# Patient Record
Sex: Female | Born: 1935 | Race: White | Hispanic: No | Marital: Married | Smoking: Never smoker
Health system: Southern US, Community
[De-identification: ages and names within clinical notes are randomized; demographics above are authoritative.]

## PROBLEM LIST (undated history)

## (undated) DIAGNOSIS — I4891 Unspecified atrial fibrillation: Secondary | ICD-10-CM

## (undated) DIAGNOSIS — I639 Cerebral infarction, unspecified: Secondary | ICD-10-CM

## (undated) DIAGNOSIS — E079 Disorder of thyroid, unspecified: Secondary | ICD-10-CM

## (undated) DIAGNOSIS — E785 Hyperlipidemia, unspecified: Secondary | ICD-10-CM

## (undated) DIAGNOSIS — I10 Essential (primary) hypertension: Secondary | ICD-10-CM

## (undated) HISTORY — PX: ABDOMINAL HYSTERECTOMY: SHX81

---

## 1978-05-14 HISTORY — PX: BREAST BIOPSY: SHX20

## 2004-04-11 ENCOUNTER — Ambulatory Visit: Payer: Self-pay | Admitting: Internal Medicine

## 2004-05-30 ENCOUNTER — Ambulatory Visit: Payer: Self-pay | Admitting: Unknown Physician Specialty

## 2004-06-12 ENCOUNTER — Ambulatory Visit: Payer: Self-pay | Admitting: Unknown Physician Specialty

## 2005-04-19 ENCOUNTER — Ambulatory Visit: Payer: Self-pay | Admitting: Internal Medicine

## 2005-12-13 ENCOUNTER — Ambulatory Visit: Payer: Self-pay | Admitting: Unknown Physician Specialty

## 2006-05-03 ENCOUNTER — Ambulatory Visit: Payer: Self-pay | Admitting: Internal Medicine

## 2007-03-15 ENCOUNTER — Other Ambulatory Visit: Payer: Self-pay

## 2007-03-15 ENCOUNTER — Emergency Department: Payer: Self-pay | Admitting: Emergency Medicine

## 2007-05-06 ENCOUNTER — Ambulatory Visit: Payer: Self-pay | Admitting: Internal Medicine

## 2007-08-13 ENCOUNTER — Ambulatory Visit: Payer: Self-pay | Admitting: Internal Medicine

## 2008-05-10 ENCOUNTER — Ambulatory Visit: Payer: Self-pay | Admitting: Internal Medicine

## 2008-06-24 ENCOUNTER — Emergency Department: Payer: Self-pay | Admitting: Emergency Medicine

## 2008-08-12 ENCOUNTER — Inpatient Hospital Stay: Payer: Self-pay | Admitting: Cardiology

## 2008-08-12 DIAGNOSIS — Z9889 Other specified postprocedural states: Secondary | ICD-10-CM | POA: Insufficient documentation

## 2008-09-23 ENCOUNTER — Encounter: Payer: Self-pay | Admitting: Cardiology

## 2008-10-12 ENCOUNTER — Encounter: Payer: Self-pay | Admitting: Cardiology

## 2008-11-11 ENCOUNTER — Encounter: Payer: Self-pay | Admitting: Cardiology

## 2009-05-11 ENCOUNTER — Ambulatory Visit: Payer: Self-pay | Admitting: Internal Medicine

## 2009-05-12 ENCOUNTER — Ambulatory Visit: Payer: Self-pay | Admitting: Internal Medicine

## 2009-05-14 DIAGNOSIS — I4891 Unspecified atrial fibrillation: Secondary | ICD-10-CM

## 2009-05-14 HISTORY — DX: Unspecified atrial fibrillation: I48.91

## 2009-08-30 ENCOUNTER — Ambulatory Visit: Payer: Self-pay | Admitting: Unknown Physician Specialty

## 2009-09-19 ENCOUNTER — Emergency Department: Payer: Self-pay | Admitting: Emergency Medicine

## 2010-05-08 ENCOUNTER — Emergency Department: Payer: Self-pay | Admitting: Emergency Medicine

## 2010-05-12 ENCOUNTER — Ambulatory Visit: Payer: Self-pay | Admitting: Internal Medicine

## 2010-09-15 ENCOUNTER — Ambulatory Visit: Payer: Self-pay | Admitting: Sports Medicine

## 2010-12-04 ENCOUNTER — Ambulatory Visit: Payer: Self-pay | Admitting: Orthopedic Surgery

## 2010-12-15 ENCOUNTER — Inpatient Hospital Stay: Payer: Self-pay | Admitting: Orthopedic Surgery

## 2011-05-29 ENCOUNTER — Ambulatory Visit: Payer: Self-pay | Admitting: Family Medicine

## 2011-08-05 ENCOUNTER — Emergency Department: Payer: Self-pay | Admitting: Internal Medicine

## 2011-08-05 LAB — COMPREHENSIVE METABOLIC PANEL
Albumin: 4.2 g/dL (ref 3.4–5.0)
Alkaline Phosphatase: 85 U/L (ref 50–136)
Anion Gap: 12 (ref 7–16)
BUN: 23 mg/dL — ABNORMAL HIGH (ref 7–18)
Bilirubin,Total: 0.3 mg/dL (ref 0.2–1.0)
Calcium, Total: 9.6 mg/dL (ref 8.5–10.1)
Co2: 27 mmol/L (ref 21–32)
Creatinine: 0.94 mg/dL (ref 0.60–1.30)
EGFR (African American): >60
EGFR (Non-African Amer.): >60
Potassium: 4 mmol/L (ref 3.5–5.1)
SGOT(AST): 26 U/L (ref 15–37)
SGPT (ALT): 30 U/L
Sodium: 148 mmol/L — ABNORMAL HIGH (ref 136–145)
Total Protein: 7.7 g/dL (ref 6.4–8.2)

## 2011-08-05 LAB — URINALYSIS, COMPLETE
Bacteria: NONE SEEN
Blood: NEGATIVE
Glucose,UR: NEGATIVE mg/dL (ref 0–75)
Ketone: NEGATIVE
Leukocyte Esterase: NEGATIVE
Ph: 7 (ref 4.5–8.0)
RBC,UR: <1 /HPF (ref 0–5)

## 2011-08-05 LAB — CBC
HCT: 41.5 % (ref 35.0–47.0)
HGB: 14 g/dL (ref 12.0–16.0)
MCH: 31.3 pg (ref 26.0–34.0)
MCV: 93 fL (ref 80–100)
RBC: 4.48 10*6/uL (ref 3.80–5.20)
RDW: 14.2 % (ref 11.5–14.5)
WBC: 6.5 10*3/uL (ref 3.6–11.0)

## 2011-08-05 LAB — TROPONIN I: Troponin-I: <0.02 ng/mL

## 2011-08-07 ENCOUNTER — Observation Stay: Payer: Self-pay | Admitting: Family Medicine

## 2011-08-07 ENCOUNTER — Ambulatory Visit: Payer: Self-pay | Admitting: Cardiology

## 2011-08-07 LAB — CBC
HCT: 40.1 % (ref 35.0–47.0)
HGB: 13.4 g/dL (ref 12.0–16.0)
MCH: 31.2 pg (ref 26.0–34.0)
MCHC: 33.5 g/dL (ref 32.0–36.0)
MCV: 93 fL (ref 80–100)
Platelet: 269 10*3/uL (ref 150–440)
RDW: 14.1 % (ref 11.5–14.5)
WBC: 7 10*3/uL (ref 3.6–11.0)

## 2011-08-07 LAB — PROTIME-INR
INR: 0.9
Prothrombin Time: 13 secs (ref 11.5–14.7)

## 2011-08-07 LAB — COMPREHENSIVE METABOLIC PANEL
Anion Gap: 8 (ref 7–16)
BUN: 18 mg/dL (ref 7–18)
Calcium, Total: 9.9 mg/dL (ref 8.5–10.1)
EGFR (African American): >60
Glucose: 96 mg/dL (ref 65–99)
Osmolality: 289 (ref 275–301)
Potassium: 4.2 mmol/L (ref 3.5–5.1)
SGOT(AST): 28 U/L (ref 15–37)
Sodium: 144 mmol/L (ref 136–145)

## 2011-08-07 LAB — CK TOTAL AND CKMB (NOT AT ARMC)
CK, Total: 50 U/L (ref 21–215)
CK-MB: 0.8 ng/mL (ref 0.5–3.6)

## 2011-08-07 LAB — TROPONIN I: Troponin-I: <0.02 ng/mL

## 2011-08-07 LAB — APTT: Activated PTT: 29.4 secs (ref 23.6–35.9)

## 2011-08-08 LAB — CBC WITH DIFFERENTIAL/PLATELET
Basophil #: 0 10*3/uL (ref 0.0–0.1)
HGB: 12.1 g/dL (ref 12.0–16.0)
Lymphocyte #: 2.2 10*3/uL (ref 1.0–3.6)
Lymphocyte %: 36.1 %
MCH: 31 pg (ref 26.0–34.0)
MCHC: 33.4 g/dL (ref 32.0–36.0)
MCV: 93 fL (ref 80–100)
Monocyte #: 0.6 10*3/uL (ref 0.0–0.7)
Neutrophil #: 3.1 10*3/uL (ref 1.4–6.5)
Platelet: 240 10*3/uL (ref 150–440)
RBC: 3.91 10*6/uL (ref 3.80–5.20)
RDW: 13.7 % (ref 11.5–14.5)
WBC: 6.2 10*3/uL (ref 3.6–11.0)

## 2011-08-08 LAB — COMPREHENSIVE METABOLIC PANEL
Alkaline Phosphatase: 71 U/L (ref 50–136)
BUN: 17 mg/dL (ref 7–18)
Calcium, Total: 9.1 mg/dL (ref 8.5–10.1)
Chloride: 106 mmol/L (ref 98–107)
Co2: 28 mmol/L (ref 21–32)
Creatinine: 0.97 mg/dL (ref 0.60–1.30)
EGFR (African American): >60
Glucose: 86 mg/dL (ref 65–99)
Osmolality: 290 (ref 275–301)
Potassium: 3.7 mmol/L (ref 3.5–5.1)
SGOT(AST): 20 U/L (ref 15–37)
Sodium: 145 mmol/L (ref 136–145)
Total Protein: 6.2 g/dL — ABNORMAL LOW (ref 6.4–8.2)

## 2011-08-09 LAB — CBC WITH DIFFERENTIAL/PLATELET
Basophil #: 0 10*3/uL (ref 0.0–0.1)
Basophil %: 0.5 %
Eosinophil #: 0.3 10*3/uL (ref 0.0–0.7)
Eosinophil %: 4.4 %
HCT: 37.6 % (ref 35.0–47.0)
Lymphocyte #: 1.8 10*3/uL (ref 1.0–3.6)
MCHC: 33.3 g/dL (ref 32.0–36.0)
MCV: 93 fL (ref 80–100)
Neutrophil #: 3.2 10*3/uL (ref 1.4–6.5)
Neutrophil %: 54.3 %
Platelet: 256 10*3/uL (ref 150–440)
RBC: 4.04 10*6/uL (ref 3.80–5.20)

## 2011-08-12 ENCOUNTER — Emergency Department: Payer: Self-pay | Admitting: Emergency Medicine

## 2012-06-03 ENCOUNTER — Ambulatory Visit: Payer: Self-pay | Admitting: Family Medicine

## 2012-10-06 ENCOUNTER — Emergency Department: Payer: Self-pay | Admitting: Emergency Medicine

## 2013-06-16 ENCOUNTER — Ambulatory Visit: Payer: Self-pay | Admitting: Family Medicine

## 2013-07-28 ENCOUNTER — Ambulatory Visit: Payer: Self-pay | Admitting: Rheumatology

## 2013-09-14 DIAGNOSIS — I341 Nonrheumatic mitral (valve) prolapse: Secondary | ICD-10-CM | POA: Insufficient documentation

## 2013-09-14 DIAGNOSIS — I4891 Unspecified atrial fibrillation: Secondary | ICD-10-CM | POA: Diagnosis present

## 2013-09-14 DIAGNOSIS — E039 Hypothyroidism, unspecified: Secondary | ICD-10-CM | POA: Insufficient documentation

## 2013-09-14 DIAGNOSIS — K219 Gastro-esophageal reflux disease without esophagitis: Secondary | ICD-10-CM | POA: Insufficient documentation

## 2013-09-14 DIAGNOSIS — K5790 Diverticulosis of intestine, part unspecified, without perforation or abscess without bleeding: Secondary | ICD-10-CM | POA: Insufficient documentation

## 2013-09-14 DIAGNOSIS — I10 Essential (primary) hypertension: Secondary | ICD-10-CM | POA: Insufficient documentation

## 2013-11-03 DIAGNOSIS — R7303 Prediabetes: Secondary | ICD-10-CM | POA: Insufficient documentation

## 2013-11-03 DIAGNOSIS — E7800 Pure hypercholesterolemia, unspecified: Secondary | ICD-10-CM | POA: Insufficient documentation

## 2014-09-05 NOTE — Discharge Summary (Signed)
PATIENT NAME:  Courtney Chan, Courtney Chan MR#:  [id number] DATE OF BIRTH:  23-Jul-1930  DATE OF ADMISSION:  08/07/2011 DATE OF DISCHARGE:  08/09/2011  DISCHARGE DIAGNOSES:  1. Cerebrovascular accident (nonhemorrhagic right insular cortex infarct).  2. Atrial fibrillation on Pradaxa.  3. Hypothyroidism.  4. History of coronary artery disease.  5. Hyperlipidemia.  6. Gastroesophageal reflux disease.  DISCHARGE MEDICATIONS:  1. Metoprolol succinate 100 mg p.o. extended release once daily.  2. Lovastatin 40 mg p.o. at bedtime.  3. Prilosec 20 mg p.o. daily.  4. Synthroid 100 mcg p.o. daily.  5. Calcium/vitamin D as directed.  6. Pradaxa 150 mg p.o. b.i.d.   MEDICATION TO HOLD: Aspirin for now.   CONSULTS: None.   LABORATORY, DIAGNOSTIC AND RADIOLOGICAL DATA: Patient underwent an echocardiogram that showed normal function, mild MR and mild TR. MRI of the brain showed punctate acute nonhemorrhagic right insular cortex infarct. Carotids were negative. CT of the head was negative.   Pertinent labs on day of discharge: White blood cell count 6, hemoglobin 12.5, platelets 256.   BRIEF HOSPITAL COURSE: Patient came in with symptoms of:  1. Transient ischemic attack. Initial head CT was negative. She was placed on observation. Her symptoms resolved quickly. She was evaluated by PT who stated that she did not meet any physical therapy need. She underwent a carotid study that was negative. Also, underwent echo that was normal with mild MR and TR. She had an MRI that did show a punctate acute nonhemorrhagic right insular cortex infarct. She was started on Pradaxa 150 mg p.o. b.i.d. Discussed patient with Dr. Darrold JunkerParaschos who agreed with the current regimen. Plan to hold aspirin for now in case she has bleeding complications and will defer to Dr. Darrold JunkerParaschos whether to continue that.  2. Atrial fibrillation. She was previously on metoprolol 100 morning and 50 in the evening of succinate, however, during her hospital  stay her blood pressure was quite low and her heart rate was in the low 50s therefore her medications were changed. Plan to continue with metoprolol succinate 100 mg once a day. Patient was instructed to check her blood pressure and heart rate and will make changes as needed.   DISPOSITION: She is in stable condition to be discharged to home.   FOLLOW UP: Follow up with Dr. Burnadette PopLinthavong in 1 to 2 weeks.  ____________________________ Marisue IvanKanhka Savino Whisenant, MD kl:cms D: 08/09/2011 08:32:23 ET T: 08/09/2011 14:23:24 ET JOB#: [id number]  cc: Marisue IvanKanhka Maor Meckel, MD, <Dictator> Marisue IvanKANHKA Nga Rabon MD ELECTRONICALLY SIGNED 08/15/2011 18:02

## 2014-09-05 NOTE — H&P (Signed)
PATIENT NAME:  Courtney Chan, Courtney Chan MR#:  [id number] DATE OF BIRTH:  [birth year]  DATE OF ADMISSION:  08/07/2011  CHIEF COMPLAINT: Ataxia, aphasia.   HISTORY OF PRESENT ILLNESS: Courtney Chan is a 81 year old female with history of atrial fibrillation not on anticoagulation who was walking today and had 30 minutes of inability to walk well, difficulty speaking. She went to her previously planned appointment with Dr. Saralyn Pilar who ordered head CT and carotid Doppler and sent her to the Emergency Room. She had a recurrence of this, again lasted 30 minutes total. She has not had anything similar prior. No history of strokes. Her rate has been controlled as far as her atrial fibrillation up until now with the metoprolol. She has taken aspirin. She is otherwise in her normal state of health, feels well at this point.   REVIEW OF SYSTEMS: Negative and complete other than above.   PAST MEDICAL HISTORY:  1. Hypothyroid.  2. Mitral valve prolapse.  3. Diverticulosis.  4. Gastroesophageal reflux disease.  5. Hyperlipidemia.  6. Hypertension.  7. Coronary artery disease, status post bare metal stents proximal left circ and mid right coronary artery in 2010.  8. History of intermittent atrial fibrillation.   PAST SURGICAL HISTORY:  1. Hysterectomy. 2. Appendectomy.   MEDICATIONS: 1. Calcium daily.  2. Lovastatin 40 mg daily.  3. Synthroid 100 mcg daily.  4. Prilosec 20 mg daily.  5. Metoprolol XL 100 mg in the morning and 50 mg nightly. 6. Aspirin 81 mg daily.   ALLERGIES: No drug allergies.   FAMILY HISTORY: Father died at age 74. Mother died at 64. No heart disease or cancer noted in the family.   SOCIAL HISTORY: Married. Lives with her husband who is with her now. She is retired. No tobacco or alcohol noted.   PHYSICAL EXAMINATION:   GENERAL: Pleasant, no acute distress.   VITAL SIGNS: Pulse approximately 60, regular, sinus on the monitor, blood pressure 197/96 on admission and decreased in  follow-up, weight 150 pounds, respirations 12, temperature 98.3.   GENERAL: No acute distress, pleasant.   HEENT: Normocephalic, atraumatic. OP clear.  NECK: No bruits, adenopathy, or JVD.   CHEST: Clear to auscultation and percussion.   HEART: Regular rate and rhythm without murmurs or gallops. Peripheral pulses decreased.   ABDOMEN: Soft, flat, nontender. No hepatosplenomegaly, bruits, masses, etc.   EXTREMITIES: No clubbing, cyanosis, or edema.   SKIN: No lesions noted.   NEUROLOGICAL: Nonfocal. Strength is normal. No pronator drift.   LABORATORY DATA: INR 0.9. MET-C is normal. Hemogram is normal. Carotid Doppler done in back showed no hemodynamically significant stenosis. Head CT showed no clear stroke lesions. Urinalysis was normal.   ASSESSMENT AND PLAN: Prolonged TIA, likely cardioembolic. Give her a dose of Lovenox here. Make sure she had her aspirin for the day. She will not likely need longer-term anticoagulation as she is now much higher risk for stroke with this especially in immediate follow-up period from her prolonged TIA. Will leave type up to Dr. Netty Starring in the morning. Consider Xarelto or similar for rapid effect. She otherwise is doing well. Will watch her on the monitor overnight and make sure her heart rhythm does not become tachycardic.    ____________________________ Ocie Cornfield. Ouida Sills, MD mwa:drc D: 08/07/2011 17:34:59 ET T: 08/07/2011 17:57:01 ET JOB#: [id number]  cc: Ocie Cornfield. Ouida Sills, MD, <Dictator> Kirk Ruths MD ELECTRONICALLY SIGNED 08/08/2011 6:59

## 2014-10-27 ENCOUNTER — Ambulatory Visit
Admission: RE | Admit: 2014-10-27 | Discharge: 2014-10-27 | Disposition: A | Discharge status: Home / Self Care | Payer: Medicare Other | Source: Ambulatory Visit | Attending: Family Medicine | Admitting: Family Medicine

## 2014-10-27 ENCOUNTER — Other Ambulatory Visit: Payer: Self-pay | Admitting: Family Medicine

## 2014-10-27 DIAGNOSIS — Z1231 Encounter for screening mammogram for malignant neoplasm of breast: Secondary | ICD-10-CM

## 2014-10-28 ENCOUNTER — Ambulatory Visit: Payer: Self-pay

## 2014-11-23 ENCOUNTER — Emergency Department: Payer: Medicare Other

## 2014-11-23 ENCOUNTER — Other Ambulatory Visit: Payer: Self-pay

## 2014-11-23 ENCOUNTER — Encounter: Payer: Self-pay | Admitting: Emergency Medicine

## 2014-11-23 ENCOUNTER — Emergency Department
Admission: EM | Admit: 2014-11-23 | Discharge: 2014-11-23 | Disposition: A | Discharge status: Home / Self Care | Payer: Medicare Other | Attending: Emergency Medicine | Admitting: Emergency Medicine

## 2014-11-23 DIAGNOSIS — R079 Chest pain, unspecified: Secondary | ICD-10-CM | POA: Insufficient documentation

## 2014-11-23 HISTORY — DX: Disorder of thyroid, unspecified: E07.9

## 2014-11-23 HISTORY — DX: Essential (primary) hypertension: I10

## 2014-11-23 HISTORY — DX: Hyperlipidemia, unspecified: E78.5

## 2014-11-23 HISTORY — DX: Cerebral infarction, unspecified: I63.9

## 2014-11-23 HISTORY — DX: Unspecified atrial fibrillation: I48.91

## 2014-11-23 LAB — BASIC METABOLIC PANEL
Anion gap: 8 (ref 5–15)
BUN: 21 mg/dL — ABNORMAL HIGH (ref 6–20)
CHLORIDE: 107 mmol/L (ref 101–111)
CO2: 27 mmol/L (ref 22–32)
Calcium: 9.4 mg/dL (ref 8.9–10.3)
Creatinine, Ser: 1.19 mg/dL — ABNORMAL HIGH (ref 0.44–1.00)
GFR calc Af Amer: 48 mL/min — ABNORMAL LOW (ref >60)
GFR calc non Af Amer: 42 mL/min — ABNORMAL LOW (ref >60)
Glucose, Bld: 123 mg/dL — ABNORMAL HIGH (ref 65–99)
Potassium: 4.2 mmol/L (ref 3.5–5.1)
Sodium: 142 mmol/L (ref 135–145)

## 2014-11-23 LAB — CBC
HEMATOCRIT: 38.5 % (ref 35.0–47.0)
HEMOGLOBIN: 12.6 g/dL (ref 12.0–16.0)
MCH: 29.9 pg (ref 26.0–34.0)
MCHC: 32.7 g/dL (ref 32.0–36.0)
MCV: 91.4 fL (ref 80.0–100.0)
Platelets: 264 10*3/uL (ref 150–440)
RBC: 4.21 MIL/uL (ref 3.80–5.20)
RDW: 13.5 % (ref 11.5–14.5)
WBC: 7.4 10*3/uL (ref 3.6–11.0)

## 2014-11-23 LAB — TROPONIN I

## 2014-11-23 MED ORDER — NITROGLYCERIN 0.4 MG SL SUBL: 0.4000 mg | SUBLINGUAL_TABLET | SUBLINGUAL | Status: DC | PRN

## 2014-11-23 NOTE — Discharge Instructions (Signed)

## 2014-11-23 NOTE — ED Notes (Signed)
MD at bedside to discuss results and plan of care.  Will continue to monitor.

## 2014-11-23 NOTE — ED Provider Notes (Signed)
[facility] Emergency Department Provider Note     Time seen: ----------------------------------------- 8:52 AM on 11/23/2014 -----------------------------------------    I have reviewed the triage vital signs and the nursing notes.   HISTORY  Chief Complaint No chief complaint on file.    HPI Courtney Chan is a 81 y.o. female who presents ER for intermittent dull chest pain and started yesterday morning. Nothing makes it better or worse. She's not had any other associated symptoms such as fevers chills diaphoresis sweats or nausea. She states she had a stress test 2 years ago that was normal. Pain is mild this time   No past medical history on file.  There are no active problems to display for this patient.   Past Surgical History  Procedure Laterality Date  . Breast biopsy Left 1980    Negative    Allergies Review of patient's allergies indicates not on file.  Social History History  Substance Use Topics  . Smoking status: Not on file  . Smokeless tobacco: Not on file  . Alcohol Use: Not on file    Review of Systems Constitutional: Negative for fever. Eyes: Negative for visual changes. ENT: Negative for sore throat. Cardiovascular:  Positive for chest pain Respiratory: Negative for shortness of breath. Gastrointestinal: Negative for abdominal pain, vomiting and diarrhea. Genitourinary: Negative for dysuria. Musculoskeletal: Negative for back pain. Skin: Negative for rash. Neurological: Negative for headaches, focal weakness or numbness.  10-point ROS otherwise negative.  ____________________________________________   PHYSICAL EXAM:  VITAL SIGNS: ED Triage Vitals  Enc Vitals Group     BP --      Pulse --      Resp --      Temp --      Temp src --      SpO2 --      Weight --      Height --      Head Cir --      Peak Flow --      Pain Score --      Pain Loc --      Pain Edu? --      Excl. in GC? --      Constitutional: Alert and oriented. Well appearing and in no distress. Eyes: Conjunctivae are normal. PERRL. Normal extraocular movements. ENT   Head: Normocephalic and atraumatic.   Nose: No congestion/rhinnorhea.   Mouth/Throat: Mucous membranes are moist.   Neck: No stridor. Hematological/Lymphatic/Immunilogical: No cervical lymphadenopathy. Cardiovascular: Normal rate, regular rhythm. Normal and symmetric distal pulses are present in all extremities. No murmurs, rubs, or gallops. Respiratory: Normal respiratory effort without tachypnea nor retractions. Breath sounds are clear and equal bilaterally. No wheezes/rales/rhonchi. Gastrointestinal: Soft and nontender. No distention. No abdominal bruits. There is no CVA tenderness. Musculoskeletal: Nontender with normal range of motion in all extremities. No joint effusions.  No lower extremity tenderness nor edema. Neurologic:  Normal speech and language. No gross focal neurologic deficits are appreciated. Speech is normal. No gait instability. Skin:  Skin is warm, dry and intact. No rash noted. Psychiatric: Mood and affect are normal. Speech and behavior are normal. Patient exhibits appropriate insight and judgment. ____________________________________________  EKG: Interpreted by me. Normal sinus rhythm with PACs, rate of 74 bpm, normal axis. Possible inferior infarct age and determinate.  ____________________________________________  ED COURSE:  Pertinent labs & imaging results that were available during my care of the patient were reviewed by me and considered in my medical decision making (see chart for  details).  We'll check cardiac labs, EKG and reevaluate. ____________________________________________    LABS (pertinent positives/negatives)  Labs Reviewed  BASIC METABOLIC PANEL - Abnormal; Notable for the following:    Glucose, Bld 123 (*)    BUN 21 (*)    Creatinine, Ser 1.19 (*)    GFR calc non Af Amer 42  (*)    GFR calc Af Amer 48 (*)    All other components within normal limits  CBC  TROPONIN I  TROPONIN I    RADIOLOGY Images were viewed by me  chest x-ray IMPRESSION: 1. Mild right upper lobe infiltrate cannot be completely excluded. These changes may be related to pleural parenchymal scarring. Left base stable changes of pleural parenchymal scarring.  2. Cardiomegaly. No pulmonary venous congestion. ____________________________________________  FINAL ASSESSMENT AND PLAN  Nonspecific chest pain  Plan:  Patient with labs and imaging as dictated above. Initial troponin is negative, repeat is also negative. Discussed the case with patient, she states swallowing seems to exacerbate her symptoms. This is likely GI related however will prescribed nitroglycerin tablets at patient's request. She is advised to follow-up with her cardiologist or her primary care doctor tomorrow for reevaluation.   Emily Filbert, MD   Emily Filbert, MD 11/23/14 [phone]

## 2014-11-23 NOTE — ED Notes (Signed)
Pt presents with substernal chest pain that she describes as pressure, 5/10 on pain scale. Pt reports thinking pain was epigastric and took prilosec, but pain has not subsided,.

## 2014-11-23 NOTE — ED Notes (Signed)
MD at bedside. 

## 2015-03-16 ENCOUNTER — Emergency Department
Admission: EM | Admit: 2015-03-16 | Discharge: 2015-03-16 | Disposition: A | Discharge status: Home / Self Care | Payer: Medicare Other | Attending: Emergency Medicine | Admitting: Emergency Medicine

## 2015-03-16 ENCOUNTER — Encounter: Payer: Self-pay | Admitting: Emergency Medicine

## 2015-03-16 ENCOUNTER — Emergency Department: Payer: Medicare Other

## 2015-03-16 DIAGNOSIS — Z79899 Other long term (current) drug therapy: Secondary | ICD-10-CM | POA: Insufficient documentation

## 2015-03-16 DIAGNOSIS — I10 Essential (primary) hypertension: Secondary | ICD-10-CM | POA: Diagnosis not present

## 2015-03-16 DIAGNOSIS — M25562 Pain in left knee: Secondary | ICD-10-CM | POA: Diagnosis present

## 2015-03-16 DIAGNOSIS — M109 Gout, unspecified: Secondary | ICD-10-CM | POA: Insufficient documentation

## 2015-03-16 DIAGNOSIS — M11262 Other chondrocalcinosis, left knee: Secondary | ICD-10-CM

## 2015-03-16 MED ORDER — TRAMADOL HCL 50 MG PO TABS: 50.0000 mg | ORAL_TABLET | Freq: Four times a day (QID) | ORAL | Status: DC | PRN

## 2015-03-16 MED ORDER — TRAMADOL HCL 50 MG PO TABS
50.0000 mg | ORAL_TABLET | Freq: Once | ORAL | Status: AC
  Administered 2015-03-16: 50 mg via ORAL
  Filled 2015-03-16: qty 1

## 2015-03-16 NOTE — ED Provider Notes (Signed)
[facility] Emergency Department Provider Note ____________________________________________  Time seen: Approximately 4:18 PM  I have reviewed the triage vital signs and the nursing notes.   HISTORY  Chief Complaint Knee Pain   HPI Courtney Chan is a 82 y.o. female for evaluation of knee pain.She denies injury or chronic deep pain. She states that she awakened last night with the pain and it continued through the night and into today. She and her husband take daily walks, but otherwise has not had an increase in physical activity. She denies history of gout.   Past Medical History  Diagnosis Date  . Hypertension   . Atrial fibrillation (HCC) 2011  . Thyroid disease   . Hyperlipidemia   . Stroke [facility])     There are no active problems to display for this patient.   Past Surgical History  Procedure Laterality Date  . Breast biopsy Left 1980    Negative  . Abdominal hysterectomy      Current Outpatient Rx  Name  Route  Sig  Dispense  Refill  . Calcium Carbonate-Vitamin D 600-400 MG-UNIT per tablet   Oral   Take 1 tablet by mouth 2 (two) times daily.         Marland Kitchen levothyroxine (SYNTHROID, LEVOTHROID) 88 MCG tablet   Oral   Take 1 tablet by mouth every morning.         . lovastatin (MEVACOR) 40 MG tablet   Oral   Take 1 tablet by mouth at bedtime.         . metoprolol succinate (TOPROL-XL) 100 MG 24 hr tablet   Oral   Take 1 tablet by mouth every morning.         . nitroGLYCERIN (NITROSTAT) 0.4 MG SL tablet   Sublingual   Place 1 tablet (0.4 mg total) under the tongue every 5 (five) minutes as needed for chest pain.   30 tablet   3   . omeprazole (PRILOSEC) 20 MG capsule   Oral   Take 1 tablet by mouth daily.         Marland Kitchen PRADAXA 150 MG CAPS capsule   Oral   Take 1 tablet by mouth 2 (two) times daily.      2     Dispense as written.   . traMADol (ULTRAM) 50 MG tablet   Oral   Take 1 tablet (50 mg total) by mouth every  6 (six) hours as needed.   20 tablet   0     Allergies Oxycodone  Family History  Problem Relation Age of Onset  . Breast cancer Maternal Grandmother     35's    Social History Social History  Substance Use Topics  . Smoking status: Never Smoker   . Smokeless tobacco: None  . Alcohol Use: No    Review of Systems Constitutional: No recent illness. Eyes: No visual changes. ENT: No sore throat. Cardiovascular: Denies chest pain or palpitations. Respiratory: Denies shortness of breath. Gastrointestinal: No abdominal pain.  Genitourinary: Negative for dysuria. Musculoskeletal: Pain in left knee. Skin: Negative for rash. Neurological: Negative for headaches, focal weakness or numbness. 10-point ROS otherwise negative.  ____________________________________________   PHYSICAL EXAM:  VITAL SIGNS: ED Triage Vitals  Enc Vitals Group     BP 03/16/15 1510 138/48 mmHg     Pulse Rate 03/16/15 1510 60     Resp --      Temp 03/16/15 1510 99.1 F (37.3 C)     Temp Source  03/16/15 1510 Oral     SpO2 03/16/15 1510 94 %     Weight 03/16/15 1510 147 lb (66.679 kg)     Height 03/16/15 1510 5\' 3"  (1.6 m)     Head Cir --      Peak Flow --      Pain Score 03/16/15 1512 8     Pain Loc --      Pain Edu? --      Excl. in GC? --     Constitutional: Alert and oriented. Well appearing and in no acute distress. Eyes: Conjunctivae are normal. EOMI. Head: Atraumatic. Nose: No congestion/rhinnorhea. Neck: No stridor.  Respiratory: Normal respiratory effort.   Musculoskeletal: Joint effusion noted to the anterior aspect of the left knee. There is no erythema, however there is increase in warmth in comparison to the right knee. Range of motion is possible but painful. Neurologic:  Normal speech and language. No gross focal neurologic deficits are appreciated. Speech is normal. No gait instability. Skin:  Skin is warm, dry and intact. Atraumatic. Psychiatric: Mood and affect are  normal. Speech and behavior are normal.  ____________________________________________   LABS (all labs ordered are listed, but only abnormal results are displayed)  Labs Reviewed - No data to display ____________________________________________  RADIOLOGY  Tricompartmental degenerative changes with chondrocalcinosis and questionable CPPD.  I, Kem Boroughsari Veroncia Jezek, personally viewed and evaluated these images (plain radiographs) as part of my medical decision making.   ____________________________________________   PROCEDURES  Procedure(s) performed: Knee immobilizer was applied by the RN. Neurovascularly intact post-application   ____________________________________________   INITIAL IMPRESSION / ASSESSMENT AND PLAN / ED COURSE  Pertinent labs & imaging results that were available during my care of the patient were reviewed by me and considered in my medical decision making (see chart for details).  Patient was instructed to call and schedule follow-up with her orthopedist, Dr. Rosita KeaMenz.  ER return precautions were discussed. ____________________________________________   FINAL CLINICAL IMPRESSION(S) / ED DIAGNOSES  Final diagnoses:  Pseudogout of knee, left       Chinita PesterCari B Aadhya Bustamante, FNP 03/16/15 1832  Myrna Blazeravid Matthew Schaevitz, MD 03/16/15 [phone]

## 2015-03-16 NOTE — ED Notes (Signed)
Pt was awaken by left knee pain this morning. Denies injury.

## 2015-03-16 NOTE — Discharge Instructions (Signed)
Calcium Pyrophosphate Deposition   Calcium pyrophosphate deposition (CPPD), which is also called pseudogout, is a type of arthritis that causes pain, swelling, and inflammation in a joint. The joint pain can be severe and may last for days. If it is not treated, the pain may last much longer. Attacks of CPPD may come and go. This condition usually affects one joint at a time. The joints that are affected most commonly are the knees, but this condition can also affect the wrists, elbows, shoulders, or ankles.  CPPD is similar to gout. Both conditions result from the buildup of crystals in the joint. However, CPPD is caused by a type of crystal that is different than the crystals that cause gout.  CAUSES  This condition is caused by the buildup of calcium pyrophosphate dihydrate crystals in the joint. The reason why this buildup occurs is not known. The condition may be passed down from parent to child (hereditary).  RISK FACTORS  This condition is more likely to develop in people who:  · Are over 60 years old.  · Have a family history of the condition.  · Have had joint replacement surgery.  · Have had a recent injury.  · Have certain medical conditions, such as hemophilia, ochronosis, amyloidosis, or hormonal disorders.  · Have low blood magnesium levels.  SYMPTOMS  Symptoms of this condition include:  · Pain in a joint. The pain may:    Be intense and constant.    Come on quickly.    Get worse with movement.    Last from several days to a few weeks.  · Redness, swelling, and warmth at the joint.  · Stiffness of the joint.  DIAGNOSIS  To diagnose this condition, your health care provider will use a needle to remove fluid from the joint. The fluid will be examined under a microscope to check for the crystals that cause CPPD. You may also have imaging tests, such as:  · X-rays.  · Ultrasound.  TREATMENT  There is no way to remove the crystals from the joint and no way to cure this condition. However, treatment can  relieve symptoms and improve joint function. Treatment may include:  · Nonsteroidal anti-inflammatory drugs (NSAIDs) to reduce inflammation and pain.  · Medicines to help prevent attacks.  · Injections of medicine (cortisone) into the joint to reduce pain and swelling.  · Physical therapy to improve joint function.  HOME CARE INSTRUCTIONS  · Take medicines only as directed by your health care provider.  · Rest the affected joints until your symptoms start to go away.  · Keep your affected joints raised (elevated) when possible. This will help to reduce swelling.  · If directed, apply ice to the affected area:    Put ice in a plastic bag.    Place a towel between your skin and the bag.    Leave the ice on for 20 minutes, 2-3 times per day.  · If the painful joint is in your leg, use crutches as directed by your health care provider.  · When your symptoms start to go away, begin to exercise regularly or do physical therapy. Talk with your health care provider or physical therapist about what types of exercise are safe for you. Low-impact exercise may be best. This includes walking, swimming, bicycling, and water aerobics.  · Maintain a healthy weight so your joints do not need to bear more weight than necessary.  SEEK MEDICAL CARE IF:  · You have an increase   in joint pain that is not relieved with medicine.  · Your joint becomes more red, swollen, or stiff.  · You have a fever.  · You have a skin rash.     This information is not intended to replace advice given to you by your health care provider. Make sure you discuss any questions you have with your health care provider.     Document Released: 01/21/2004 Document Revised: 09/14/2014 Document Reviewed: 04/07/2014  [employer] Interactive Patient Education ©2016 [employer]

## 2015-11-01 ENCOUNTER — Other Ambulatory Visit: Payer: Self-pay | Admitting: Family Medicine

## 2015-11-01 DIAGNOSIS — Z1231 Encounter for screening mammogram for malignant neoplasm of breast: Secondary | ICD-10-CM

## 2015-11-01 DIAGNOSIS — Z7189 Other specified counseling: Secondary | ICD-10-CM | POA: Insufficient documentation

## 2015-11-01 DIAGNOSIS — Z7185 Encounter for immunization safety counseling: Secondary | ICD-10-CM | POA: Insufficient documentation

## 2015-11-25 ENCOUNTER — Ambulatory Visit
Admission: RE | Admit: 2015-11-25 | Discharge: 2015-11-25 | Disposition: A | Discharge status: Home / Self Care | Payer: Medicare Other | Source: Ambulatory Visit | Attending: Family Medicine | Admitting: Family Medicine

## 2015-11-25 ENCOUNTER — Other Ambulatory Visit: Payer: Self-pay | Admitting: Family Medicine

## 2015-11-25 DIAGNOSIS — Z1231 Encounter for screening mammogram for malignant neoplasm of breast: Secondary | ICD-10-CM | POA: Insufficient documentation

## 2016-05-02 DIAGNOSIS — Z0000 Encounter for general adult medical examination without abnormal findings: Secondary | ICD-10-CM | POA: Insufficient documentation

## 2016-10-31 DIAGNOSIS — Z66 Do not resuscitate: Secondary | ICD-10-CM | POA: Insufficient documentation

## 2016-11-09 DIAGNOSIS — M8589 Other specified disorders of bone density and structure, multiple sites: Secondary | ICD-10-CM | POA: Insufficient documentation

## 2017-06-13 ENCOUNTER — Emergency Department: Payer: Medicare Other

## 2017-06-13 ENCOUNTER — Other Ambulatory Visit: Payer: Self-pay

## 2017-06-13 ENCOUNTER — Emergency Department
Admission: EM | Admit: 2017-06-13 | Discharge: 2017-06-13 | Disposition: A | Discharge status: Home / Self Care | Payer: Medicare Other | Attending: Emergency Medicine | Admitting: Emergency Medicine

## 2017-06-13 DIAGNOSIS — I10 Essential (primary) hypertension: Secondary | ICD-10-CM | POA: Diagnosis not present

## 2017-06-13 DIAGNOSIS — R142 Eructation: Secondary | ICD-10-CM | POA: Diagnosis not present

## 2017-06-13 DIAGNOSIS — Z8673 Personal history of transient ischemic attack (TIA), and cerebral infarction without residual deficits: Secondary | ICD-10-CM | POA: Insufficient documentation

## 2017-06-13 DIAGNOSIS — R079 Chest pain, unspecified: Secondary | ICD-10-CM

## 2017-06-13 DIAGNOSIS — R7989 Other specified abnormal findings of blood chemistry: Secondary | ICD-10-CM | POA: Diagnosis not present

## 2017-06-13 DIAGNOSIS — I4891 Unspecified atrial fibrillation: Secondary | ICD-10-CM | POA: Insufficient documentation

## 2017-06-13 LAB — CBC
HEMATOCRIT: 38.6 % (ref 35.0–47.0)
HEMOGLOBIN: 12.8 g/dL (ref 12.0–16.0)
MCH: 30.9 pg (ref 26.0–34.0)
MCHC: 33.1 g/dL (ref 32.0–36.0)
MCV: 93.5 fL (ref 80.0–100.0)
Platelets: 249 10*3/uL (ref 150–440)
RBC: 4.13 MIL/uL (ref 3.80–5.20)
RDW: 13.9 % (ref 11.5–14.5)
WBC: 6.2 10*3/uL (ref 3.6–11.0)

## 2017-06-13 LAB — TROPONIN I
Troponin I: <0.03 ng/mL (ref <0.03)
Troponin I: <0.03 ng/mL (ref <0.03)

## 2017-06-13 LAB — BASIC METABOLIC PANEL
ANION GAP: 7 (ref 5–15)
BUN: 25 mg/dL — AB (ref 6–20)
CHLORIDE: 108 mmol/L (ref 101–111)
CO2: 26 mmol/L (ref 22–32)
Calcium: 9.5 mg/dL (ref 8.9–10.3)
Creatinine, Ser: 1.15 mg/dL — ABNORMAL HIGH (ref 0.44–1.00)
GFR calc non Af Amer: 42 mL/min — ABNORMAL LOW (ref >60)
GFR, EST AFRICAN AMERICAN: 49 mL/min — AB (ref >60)
Glucose, Bld: 121 mg/dL — ABNORMAL HIGH (ref 65–99)
POTASSIUM: 4.4 mmol/L (ref 3.5–5.1)
SODIUM: 141 mmol/L (ref 135–145)

## 2017-06-13 LAB — BRAIN NATRIURETIC PEPTIDE: B NATRIURETIC PEPTIDE 5: 283 pg/mL — AB (ref 0.0–100.0)

## 2017-06-13 MED ORDER — GI COCKTAIL ~~LOC~~
30.0000 mL | Freq: Once | ORAL | Status: AC
  Administered 2017-06-13: 30 mL via ORAL
  Filled 2017-06-13: qty 30

## 2017-06-13 MED ORDER — ACETAMINOPHEN 500 MG PO TABS
1000.0000 mg | ORAL_TABLET | Freq: Once | ORAL | Status: AC
  Administered 2017-06-13: 1000 mg via ORAL

## 2017-06-13 MED ORDER — ACETAMINOPHEN 500 MG PO TABS
ORAL_TABLET | ORAL | Status: AC
  Administered 2017-06-13: 1000 mg via ORAL
  Filled 2017-06-13: qty 2

## 2017-06-13 NOTE — ED Provider Notes (Signed)
[facility] Emergency Department Provider Note  ____________________________________________  Time seen: Approximately 6:11 PM  I have reviewed the triage vital signs and the nursing notes.   HISTORY  Chief Complaint Chest Pain    HPI Courtney Chan is a 84 y.o. female with a history of A. fib on Pradaxa, HTN, HL, CVA presenting for chest pain.  The patient reports that all afternoon yesterday, she had a constant chest pain across the entirety of the chest that was worse with positional changes.  She did not have any associated lightheadedness or syncope, palpitations, shortness of breath, diaphoresis, nausea or vomiting.  She has not had any trauma or new exercise regimens.  No skin changes.  In the evening, she took a Tylenol and the pain went away.  Today, after eating a sausage biscuit and water, she developed a more central chest pain associated with burping.  She has had GERD in the past and is not currently being treated for it.  She did not try anything for pain today, and her pain persists at this time.  No lower extremity swelling or calf pain, recent cough or cold symptoms, fevers or chills.  Last stress test was approximately 3 hours ago and was reportedly normal.  Past Medical History:  Diagnosis Date  . Atrial fibrillation (HCC) 2011  . Hyperlipidemia   . Hypertension   . Stroke (HCC)   . Thyroid disease     There are no active problems to display for this patient.   Past Surgical History:  Procedure Laterality Date  . ABDOMINAL HYSTERECTOMY    . BREAST BIOPSY Left 1980   Negative    Current Outpatient Rx  . Order #: [id number] Class: Historical Med  . Order #: [id number] Class: Historical Med  . Order #: [id number] Class: Historical Med  . Order #: [id number] Class: Historical Med  . Order #: [id number] Class: Print  . Order #: [id number] Class: Historical Med  . Order #: [id number] Class: Historical Med  . Order #: [id number] Class: Print     Allergies Oxycodone  Family History  Problem Relation Age of Onset  . Breast cancer Maternal Grandmother        71's    Social History Social History   Tobacco Use  . Smoking status: Never Smoker  . Smokeless tobacco: Never Used  Substance Use Topics  . Alcohol use: No  . Drug use: No    Review of Systems Constitutional: No fever/chills.  No lightheadedness or syncope.  No diaphoresis. Eyes: No visual changes. ENT: No sore throat. No congestion or rhinorrhea. Cardiovascular: Positive chest pain. Denies palpitations. Respiratory: Denies shortness of breath.  No cough. Gastrointestinal: No abdominal pain.  No nausea, no vomiting.  No diarrhea.  No constipation.  Positive burping. Genitourinary: Negative for dysuria. Musculoskeletal: Negative for back pain.  No lower extremity swelling or calf pain. Skin: Negative for rash. Neurological: Negative for headaches. No focal numbness, tingling or weakness.     ____________________________________________   PHYSICAL EXAM:  VITAL SIGNS: ED Triage Vitals  Enc Vitals Group     BP 06/13/17 1624 (!) 148/51     Pulse Rate 06/13/17 1624 67     Resp 06/13/17 1624 18     Temp 06/13/17 1624 98.9 F (37.2 C)     Temp Source 06/13/17 1624 Oral     SpO2 06/13/17 1624 96 %     Weight 06/13/17 1614 150 lb (68 kg)     Height 06/13/17 1614 5\' 3"  (1.6 m)  Head Circumference --      Peak Flow --      Pain Score 06/13/17 1614 9     Pain Loc --      Pain Edu? --      Excl. in GC? --     Constitutional: Alert and oriented. Well appearing for her age and in no acute distress. Answers questions appropriately. Eyes: Conjunctivae are normal.  EOMI. No scleral icterus. Head: Atraumatic. Nose: No congestion/rhinnorhea. Mouth/Throat: Mucous membranes are moist.  Neck: No stridor.  Supple.  No JVD.  No meningismus. Cardiovascular: Normal rate, regular rhythm. No murmurs, rubs or gallops.   Respiratory: Normal respiratory effort.   No accessory muscle use or retractions.  The patient has mild rales at both lung bases.  No wheezes or rhonchi. Gastrointestinal: Overweight.  Soft, nontender and nondistended.  No guarding or rebound.  No peritoneal signs. Musculoskeletal: No LE edema. No ttp in the calves or palpable cords.  Negative Homan's sign. Neurologic:  A&Ox3.  Speech is clear.  Face and smile are symmetric.  EOMI.  Moves all extremities well. Skin:  Skin is warm, dry and intact. No rash noted. Psychiatric: Mood and affect are normal. Speech and behavior are normal.  Normal judgement  ____________________________________________   LABS (all labs ordered are listed, but only abnormal results are displayed)  Labs Reviewed  BASIC METABOLIC PANEL - Abnormal; Notable for the following components:      Result Value   Glucose, Bld 121 (*)    BUN 25 (*)    Creatinine, Ser 1.15 (*)    GFR calc non Af Amer 42 (*)    GFR calc Af Amer 49 (*)    All other components within normal limits  BRAIN NATRIURETIC PEPTIDE - Abnormal; Notable for the following components:   B Natriuretic Peptide 283.0 (*)    All other components within normal limits  CBC  TROPONIN I  TROPONIN I   ____________________________________________  EKG  ED ECG REPORT I, Rockne Menghini, the attending physician, personally viewed and interpreted this ECG.   Date: 06/13/2017  EKG Time: 1618  Rate: 67  Rhythm: normal sinus rhythm  Axis: normal  Intervals:none  ST&T Change: Nonspecific T wave inversions in V1 and V2.  No STEMI  ____________________________________________  RADIOLOGY  Dg Chest 2 View  Result Date: 06/13/2017 CLINICAL DATA:  Chest pain beginning yesterday left-sided radiating to shoulder. EXAM: CHEST  2 VIEW COMPARISON:  11/23/2014 FINDINGS: Lungs are adequately inflated with mild stable elevation of the left hemidiaphragm. Mild opacification over the left base which may be due to atelectasis or infection. No definite  effusion. Cardiomediastinal silhouette and remainder of the exam is unchanged. IMPRESSION: Mild left basilar opacification which may be due to atelectasis or infection. Electronically Signed   By: Elberta Fortis M.D.   On: 06/13/2017 16:56    ____________________________________________   PROCEDURES  Procedure(s) performed: None  Procedures  Critical Care performed: No ____________________________________________   INITIAL IMPRESSION / ASSESSMENT AND PLAN / ED COURSE  Pertinent labs & imaging results that were available during my care of the patient were reviewed by me and considered in my medical decision making (see chart for details).  84 y.o. female with a history of A. fib and CVA on Pradaxa, HTN, HL, presenting with constant chest pain yesterday that resolved with Tylenol and was positional, now with central chest pain associated with burping.  Overall, the patient is well-appearing and hemodynamically stable.  Given her age and risk factors,  ACS or MI is not excluded but her workup in the emergency department is reassuring so far.  Her EKG does not show ischemic changes and her troponin is negative.  Her chest x-ray does not show an enlarged cardiac silhouette.  She does have evidence of atelectasis versus early infection on her chest x-ray and clinically she does have some rales in the bases bilaterally although they are mild.  The patient is oxygenating well, and has no history of cough or fevers at this time.  I favor that this chest x-ray is likely showing atelectasis, but I have cautioned her if she develops any symptoms that would be concerning for an infectious process.  New onset CHF is also possible so BNP has been ordered but the patient has no evidence of peripheral edema and she has never carried this diagnosis in the past.  PE is unlikely.  Reflux is possible and I will treat the patient with a GI cocktail and reevaluate her.  Plan reevaluation for final  disposition.  ----------------------------------------- 6:21 PM on 06/13/2017 -----------------------------------------  I have spoken with Dr. Darrold JunkerParaschos, her cardiologist, who agrees with the plan and will see her as an outpatient assuming the remainder of her workup is reassuring.  ----------------------------------------- 7:22 PM on 06/13/2017 -----------------------------------------  The patient's repeat troponin is also negative.  At this time, her pain has significantly improved.  She has a BNP which is 283, never has had this checked before.  She will follow-up with her cardiologist, who can do further evaluation for mild CHF, but there is no indication for admission today.  I have discussed this with the patient and her husband, who understand and are in agreement with the plan.  ____________________________________________  FINAL CLINICAL IMPRESSION(S) / ED DIAGNOSES  Final diagnoses:  Chest pain, unspecified type  Burping  Elevated brain natriuretic peptide (BNP) level         NEW MEDICATIONS STARTED DURING THIS VISIT:  New Prescriptions   No medications on file      Rockne MenghiniNorman, Anne-Caroline, MD 06/13/17 1925

## 2017-06-13 NOTE — Discharge Instructions (Addendum)
Please make a follow-up appointment with your cardiologist, who may recommend stress testing and also an ultrasound of your heart to evaluate for congestive heart failure.  Please return to the emergency department if you develop severe pain, palpitations, lightheadedness or fainting, shortness of breath, or any other symptoms concerning to you.

## 2017-06-13 NOTE — ED Notes (Signed)
Hardcopy of DC signed. 

## 2017-06-13 NOTE — ED Triage Notes (Signed)
Pt c/o chest pain that started yesterday evening - pain is located on left side and radiates into left shoulder - denies shortness of breath - denies dizziness - denies N/V

## 2017-08-07 ENCOUNTER — Emergency Department
Admission: EM | Admit: 2017-08-07 | Discharge: 2017-08-07 | Disposition: A | Discharge status: Home / Self Care | Payer: Medicare Other | Attending: Emergency Medicine | Admitting: Emergency Medicine

## 2017-08-07 ENCOUNTER — Emergency Department: Payer: Medicare Other

## 2017-08-07 ENCOUNTER — Other Ambulatory Visit: Payer: Self-pay

## 2017-08-07 DIAGNOSIS — Z79899 Other long term (current) drug therapy: Secondary | ICD-10-CM | POA: Diagnosis not present

## 2017-08-07 DIAGNOSIS — Y999 Unspecified external cause status: Secondary | ICD-10-CM | POA: Insufficient documentation

## 2017-08-07 DIAGNOSIS — Y929 Unspecified place or not applicable: Secondary | ICD-10-CM | POA: Diagnosis not present

## 2017-08-07 DIAGNOSIS — Y9301 Activity, walking, marching and hiking: Secondary | ICD-10-CM | POA: Insufficient documentation

## 2017-08-07 DIAGNOSIS — X58XXXA Exposure to other specified factors, initial encounter: Secondary | ICD-10-CM | POA: Diagnosis not present

## 2017-08-07 DIAGNOSIS — I10 Essential (primary) hypertension: Secondary | ICD-10-CM | POA: Insufficient documentation

## 2017-08-07 DIAGNOSIS — M7062 Trochanteric bursitis, left hip: Secondary | ICD-10-CM | POA: Diagnosis not present

## 2017-08-07 DIAGNOSIS — M1612 Unilateral primary osteoarthritis, left hip: Secondary | ICD-10-CM

## 2017-08-07 DIAGNOSIS — M25552 Pain in left hip: Secondary | ICD-10-CM

## 2017-08-07 MED ORDER — HYDROCODONE-ACETAMINOPHEN 5-325 MG PO TABS: 1.0000 | ORAL_TABLET | ORAL | 0 refills | Status: DC | PRN

## 2017-08-07 MED ORDER — HYDROCODONE-ACETAMINOPHEN 5-325 MG PO TABS
1.0000 | ORAL_TABLET | Freq: Once | ORAL | Status: AC
  Administered 2017-08-07: 1 via ORAL
  Filled 2017-08-07: qty 1

## 2017-08-07 MED ORDER — DEXAMETHASONE SODIUM PHOSPHATE 10 MG/ML IJ SOLN
10.0000 mg | Freq: Once | INTRAMUSCULAR | Status: AC
  Administered 2017-08-07: 10 mg via INTRAMUSCULAR
  Filled 2017-08-07: qty 1

## 2017-08-07 MED ORDER — PREDNISONE 10 MG PO TABS: 10.0000 mg | ORAL_TABLET | Freq: Every day | ORAL | 0 refills | Status: DC

## 2017-08-07 NOTE — ED Triage Notes (Signed)
Pt c/o left hip/groin pain since walking yesterday morning. Denies injury or swelling. States it is just painful to ambulate.

## 2017-08-07 NOTE — ED Notes (Signed)
First Nurse Note:  Patient presents to the ED with hip pain that gradually has increased since walking with husband yesterday.  Denies any trauma or fall to hip.  Patient is now having extreme difficulty walking and describes pain as "excrutiating."  Sitting in the wheelchair patient is in no obvious distress at this time.

## 2017-08-07 NOTE — ED Provider Notes (Signed)
[facility] Emergency Department Provider Note  ____________________________________________  Time seen: Approximately 4:17 PM  I have reviewed the triage vital signs and the nursing notes.   HISTORY  Chief Complaint Hip Pain    HPI Courtney Chan is a 84 y.o. female who presents the emergency department with her husband for complaint of left hip/groin pain.  Patient reports that yesterday her groin was bothering her, she went on a walk with her husband and symptoms drastically worsen.  Patient reports excruciating pain with any weightbearing or ambulation.  She states that the pain is manageable without any weightbearing.  Patient denies any significant trauma.  Patient did have a recent diagnosis of C. difficile, but completed her oral vancomycin 10 days ago.  Patient denies any diarrhea or constipation at this time.  She denies any abdominal pain, dysuria, polyuria, hematuria.  No vaginal bleeding or discharge.  Patient has not taken any medications for this complaint prior to arrival.  She denies any lower back pain.  She denies any radicular symptoms down the left leg.  Patient has a significant medical history for atrial fibrillation, hypertension, CVA, thyroid disease.  Patient denies any complaints with chronic medical problems.  She denies any history of osteopenia or osteoporosis.  Past Medical History:  Diagnosis Date  . Atrial fibrillation (HCC) 2011  . Hyperlipidemia   . Hypertension   . Stroke (HCC)   . Thyroid disease     There are no active problems to display for this patient.   Past Surgical History:  Procedure Laterality Date  . ABDOMINAL HYSTERECTOMY    . BREAST BIOPSY Left 1980   Negative    Prior to Admission medications   Medication Sig Start Date End Date Taking? Authorizing Provider  Calcium Carbonate-Vitamin D 600-400 MG-UNIT per tablet Take 1 tablet by mouth 2 (two) times daily.    [provider]   HYDROcodone-acetaminophen (NORCO/VICODIN) 5-325 MG tablet Take 1 tablet by mouth every 4 (four) hours as needed for severe pain. 08/07/17   Ajah Vanhoose, Delorise Royals, PA-C  levothyroxine (SYNTHROID, LEVOTHROID) 88 MCG tablet Take 1 tablet by mouth every morning. 10/27/14 10/27/15  [provider]  lovastatin (MEVACOR) 40 MG tablet Take 1 tablet by mouth at bedtime. 10/27/14   [provider]  metoprolol succinate (TOPROL-XL) 100 MG 24 hr tablet Take 1 tablet by mouth every morning. 04/27/14   [provider]  nitroGLYCERIN (NITROSTAT) 0.4 MG SL tablet Place 1 tablet (0.4 mg total) under the tongue every 5 (five) minutes as needed for chest pain. 11/23/14 11/23/15  Emily Filbert, MD  omeprazole (PRILOSEC) 20 MG capsule Take 1 tablet by mouth daily. 11/30/13   [provider]  PRADAXA 150 MG CAPS capsule Take 1 tablet by mouth 2 (two) times daily. 09/25/14   [provider]  predniSONE (DELTASONE) 10 MG tablet Take 1 tablet (10 mg total) by mouth daily. 08/07/17   Airen Stiehl, Delorise Royals, PA-C  traMADol (ULTRAM) 50 MG tablet Take 1 tablet (50 mg total) by mouth every 6 (six) hours as needed. 03/16/15   Chinita Pester, FNP    Allergies Oxycodone  Family History  Problem Relation Age of Onset  . Breast cancer Maternal Grandmother        62's    Social History Social History   Tobacco Use  . Smoking status: Never Smoker  . Smokeless tobacco: Never Used  Substance Use Topics  . Alcohol use: No  . Drug use: No  Review of Systems  Constitutional: No fever/chills Eyes: No visual changes. No discharge ENT: No upper respiratory complaints. Cardiovascular: no chest pain. Respiratory: no cough. No SOB. Gastrointestinal: No abdominal pain.  No nausea, no vomiting.  No diarrhea.  No constipation. Genitourinary: Negative for dysuria. No hematuria Musculoskeletal: Positive for left hip/groin pain Skin: Negative for rash, abrasions, lacerations,  ecchymosis. Neurological: Negative for headaches, focal weakness or numbness. 10-point ROS otherwise negative.  ____________________________________________   PHYSICAL EXAM:  VITAL SIGNS: ED Triage Vitals  Enc Vitals Group     BP 08/07/17 1549 132/62     Pulse Rate 08/07/17 1549 70     Resp 08/07/17 1549 17     Temp 08/07/17 1549 98.7 F (37.1 C)     Temp Source 08/07/17 1549 Oral     SpO2 08/07/17 1549 96 %     Weight 08/07/17 1550 147 lb (66.7 kg)     Height 08/07/17 1550 5\' 3"  (1.6 m)     Head Circumference --      Peak Flow --      Pain Score 08/07/17 1549 8     Pain Loc --      Pain Edu? --      Excl. in GC? --      Constitutional: Alert and oriented. Well appearing and in no acute distress. Eyes: Conjunctivae are normal. PERRL. EOMI. Head: Atraumatic. Neck: No stridor.    Cardiovascular: Normal rate, regular rhythm. Normal S1 and S2.  Good peripheral circulation. Respiratory: Normal respiratory effort without tachypnea or retractions. Lungs CTAB. Good air entry to the bases with no decreased or absent breath sounds. Gastrointestinal: Bowel sounds 4 quadrants. Soft and nontender to palpation. No guarding or rigidity. No palpable masses. No distention. No CVA tenderness. Musculoskeletal: Limited range of motion to the left lower extremity due to pain.  No visible deformity, ecchymosis, edema noted to the left hip.  No shortening of the left lower extremity or internal or external rotation.  Patient is nontender to palpation over the lumbar spine with no palpable abnormality or step-off.  Patient is nontender to palpation over the pelvic crest and the posterior section.  No tenderness to palpation of the iliac spine.  Patient is mildly tender to palpation over the greater trochanteric region.  She is extremely tender to palpation in the inguinal region over the pubic ramus.  No palpable abnormality or deformity to palpation in this region.  Examination of the knee and ankle  is unremarkable.  Dorsalis pedis pulse intact distally.  Sensation intact in all dermatomal distributions distally. Neurologic:  Normal speech and language. No gross focal neurologic deficits are appreciated.  Skin:  Skin is warm, dry and intact. No rash noted. Psychiatric: Mood and affect are normal. Speech and behavior are normal. Patient exhibits appropriate insight and judgement.   ____________________________________________   LABS (all labs ordered are listed, but only abnormal results are displayed)  Labs Reviewed - No data to display ____________________________________________  EKG   ____________________________________________  RADIOLOGY Festus BarrenI, Reza Crymes D Sultan Pargas, personally viewed and evaluated these images (plain radiographs) as part of my medical decision making, as well as reviewing the written report by the radiologist.  Ct Hip Left Wo Contrast  Result Date: 08/07/2017 CLINICAL DATA:  Left hip pain, difficulty walking EXAM: CT OF THE LEFT HIP WITHOUT CONTRAST TECHNIQUE: Multidetector CT imaging of the left hip was performed according to the standard protocol. Multiplanar CT image reconstructions were also generated. COMPARISON:  Left hip radiographs dated 08/08/2014  FINDINGS: No evidence of fracture or dislocation. Specifically, the left femoral neck, is intact. The visualized bony pelvis, including the left superior and inferior pubic rami, are intact. Mild degenerative changes of the left hip. No appreciable hip joint effusion. No evidence of intramuscular hematoma. Subcutaneous tissues are unremarkable. Visualized soft tissue pelvis is notable for a sigmoid diverticulosis, without evidence of diverticulitis. IMPRESSION: No fracture or dislocation is seen. Electronically Signed   By: Charline Bills M.D.   On: 08/07/2017 18:38   Dg Hip Unilat W Or Wo Pelvis 2-3 Views Left  Result Date: 08/07/2017 CLINICAL DATA:  Hip and groin pain, gradually increased since walking with  husband yesterday, no specific injury EXAM: DG HIP (WITH OR WITHOUT PELVIS) 2-3V LEFT COMPARISON:  None FINDINGS: Diffuse osseous demineralization. SI joints preserved. BILATERAL hip joint space narrowing and minimal spur formation consistent with degenerative changes. Minimal osteitis pubis. No acute fracture, dislocation, or bone destruction. Degenerative disc and facet disease changes at visualized lower lumbar spine. Small pelvic phleboliths. IMPRESSION: Degenerative changes of both hip joints and the lower lumbar spine. Electronically Signed   By: Ulyses Southward M.D.   On: 08/07/2017 17:11    ____________________________________________    PROCEDURES  Procedure(s) performed:    Procedures    Medications  dexamethasone (DECADRON) injection 10 mg (has no administration in time range)  HYDROcodone-acetaminophen (NORCO/VICODIN) 5-325 MG per tablet 1 tablet (has no administration in time range)     ____________________________________________   INITIAL IMPRESSION / ASSESSMENT AND PLAN / ED COURSE  Pertinent labs & imaging results that were available during my care of the patient were reviewed by me and considered in my medical decision making (see chart for details).  Review of the [place] CSRS was performed in accordance of the [facility] prior to dispensing any controlled drugs.  Clinical Course as of Aug 07 1909  Wed Aug 07, 2017  1750 Patient reports severe groin pain with any ambulation or weightbearing of the left lower extremity.  Patient with no significant history of trauma to the hip.  Initial x-ray reveals no obvious fracture.  In discussion with patient, she states that the pain is unbearable with weightbearing.  At this time, CT scan is ordered to further evaluate hip joint.   [JC]  1751 Differential at this time includes osteoarthritis, bursitis, hip fracture.  Initial differential included constipation, diverticulitis, UTI, hip fracture, osteoarthritis, bursitis.   [JC]     Clinical Course User Index [JC] Neythan Kozlov, Delorise Royals, PA-C    Patient's diagnosis is consistent with bursitis of the left hip.  Patient presented with sharp, acute left hip and groin pain.  Initial differential included fracture, dislocation, osteoarthritis, bursitis, UTI, diverticulitis, constipation.  Patient's exam was most consistent with orthopedic complaint.  Patient was evaluated with x-ray which were revealed no significant osseous abnormality.  I concurred with x-ray, however due to patient's pain as well as location, I was concerned for underlying fracture not well visualized on x-ray.  CT scan was ordered for further evaluation.  This returns with no acute osseous abnormality.  Patient has significant osteoarthritis.  With patient's daily routine of walking distances, I suspect that pain is most likely attributed to bursitis.  No further labs or imaging is deemed necessary at this time.  Patient will be placed on a course of prednisone for inflammation control.  If this does not alleviate symptoms, she will follow-up with orthopedics.  Patient is given #16 of Vicodin for excruciating pain until steroids have improved symptoms.Marland Kitchen  Patient is given ED precautions to return to the ED for any worsening or new symptoms.     ____________________________________________  FINAL CLINICAL IMPRESSION(S) / ED DIAGNOSES  Final diagnoses:  Trochanteric bursitis of left hip  Left hip pain  Primary osteoarthritis of left hip      NEW MEDICATIONS STARTED DURING THIS VISIT:  ED Discharge Orders        Ordered    predniSONE (DELTASONE) 10 MG tablet  Daily    Note to Pharmacy:  Take 6 pills x 2 days, 5 pills x 2 days, 4 pills x 2 days, 3 pills x 2 days, 2 pills x 2 days, and 1 pill x 2 days   08/07/17 1904    HYDROcodone-acetaminophen (NORCO/VICODIN) 5-325 MG tablet  Every 4 hours PRN     08/07/17 1904          This chart was dictated using voice recognition software/Dragon. Despite  best efforts to proofread, errors can occur which can change the meaning. Any change was purely unintentional.    Racheal Patches, PA-C 08/07/17 1911    Rockne Menghini, MD 08/07/17 [phone]

## 2017-08-25 ENCOUNTER — Other Ambulatory Visit: Payer: Self-pay

## 2017-08-25 ENCOUNTER — Encounter: Payer: Self-pay | Admitting: Emergency Medicine

## 2017-08-25 ENCOUNTER — Emergency Department: Payer: Medicare Other

## 2017-08-25 ENCOUNTER — Inpatient Hospital Stay
Admission: EM | Admit: 2017-08-25 | Discharge: 2017-08-27 | DRG: 309 | Disposition: A | Discharge status: Home Health | Payer: Medicare Other | Attending: Internal Medicine | Admitting: Internal Medicine

## 2017-08-25 DIAGNOSIS — I4819 Other persistent atrial fibrillation: Secondary | ICD-10-CM | POA: Diagnosis present

## 2017-08-25 DIAGNOSIS — Z7901 Long term (current) use of anticoagulants: Secondary | ICD-10-CM

## 2017-08-25 DIAGNOSIS — Z955 Presence of coronary angioplasty implant and graft: Secondary | ICD-10-CM

## 2017-08-25 DIAGNOSIS — I248 Other forms of acute ischemic heart disease: Secondary | ICD-10-CM | POA: Diagnosis present

## 2017-08-25 DIAGNOSIS — T45525A Adverse effect of antithrombotic drugs, initial encounter: Secondary | ICD-10-CM | POA: Diagnosis present

## 2017-08-25 DIAGNOSIS — Z8673 Personal history of transient ischemic attack (TIA), and cerebral infarction without residual deficits: Secondary | ICD-10-CM

## 2017-08-25 DIAGNOSIS — Z79899 Other long term (current) drug therapy: Secondary | ICD-10-CM

## 2017-08-25 DIAGNOSIS — R7989 Other specified abnormal findings of blood chemistry: Secondary | ICD-10-CM

## 2017-08-25 DIAGNOSIS — I2510 Atherosclerotic heart disease of native coronary artery without angina pectoris: Secondary | ICD-10-CM | POA: Diagnosis present

## 2017-08-25 DIAGNOSIS — R778 Other specified abnormalities of plasma proteins: Secondary | ICD-10-CM

## 2017-08-25 DIAGNOSIS — Z79890 Hormone replacement therapy (postmenopausal): Secondary | ICD-10-CM

## 2017-08-25 DIAGNOSIS — I4891 Unspecified atrial fibrillation: Secondary | ICD-10-CM | POA: Diagnosis present

## 2017-08-25 DIAGNOSIS — R042 Hemoptysis: Secondary | ICD-10-CM | POA: Diagnosis present

## 2017-08-25 DIAGNOSIS — I480 Paroxysmal atrial fibrillation: Principal | ICD-10-CM | POA: Diagnosis present

## 2017-08-25 DIAGNOSIS — I10 Essential (primary) hypertension: Secondary | ICD-10-CM | POA: Diagnosis present

## 2017-08-25 DIAGNOSIS — E785 Hyperlipidemia, unspecified: Secondary | ICD-10-CM | POA: Diagnosis present

## 2017-08-25 DIAGNOSIS — Z90710 Acquired absence of both cervix and uterus: Secondary | ICD-10-CM

## 2017-08-25 DIAGNOSIS — Z66 Do not resuscitate: Secondary | ICD-10-CM | POA: Diagnosis present

## 2017-08-25 DIAGNOSIS — Z7902 Long term (current) use of antithrombotics/antiplatelets: Secondary | ICD-10-CM

## 2017-08-25 DIAGNOSIS — R748 Abnormal levels of other serum enzymes: Secondary | ICD-10-CM | POA: Diagnosis not present

## 2017-08-25 LAB — TSH: TSH: 1.023 u[IU]/mL (ref 0.350–4.500)

## 2017-08-25 LAB — COMPREHENSIVE METABOLIC PANEL
ALBUMIN: 3.4 g/dL — AB (ref 3.5–5.0)
ALT: 18 U/L (ref 14–54)
ANION GAP: 6 (ref 5–15)
AST: 19 U/L (ref 15–41)
Alkaline Phosphatase: 51 U/L (ref 38–126)
BILIRUBIN TOTAL: 0.6 mg/dL (ref 0.3–1.2)
BUN: 20 mg/dL (ref 6–20)
CO2: 22 mmol/L (ref 22–32)
Calcium: 8.3 mg/dL — ABNORMAL LOW (ref 8.9–10.3)
Chloride: 109 mmol/L (ref 101–111)
Creatinine, Ser: 0.89 mg/dL (ref 0.44–1.00)
GFR, EST NON AFRICAN AMERICAN: 58 mL/min — AB (ref >60)
Glucose, Bld: 90 mg/dL (ref 65–99)
POTASSIUM: 4 mmol/L (ref 3.5–5.1)
Sodium: 137 mmol/L (ref 135–145)
TOTAL PROTEIN: 5.8 g/dL — AB (ref 6.5–8.1)

## 2017-08-25 LAB — CBC WITH DIFFERENTIAL/PLATELET
Basophils Absolute: 0.1 10*3/uL (ref 0–0.1)
Basophils Relative: 1 %
EOS ABS: 0.2 10*3/uL (ref 0–0.7)
EOS PCT: 2 %
HCT: 39.1 % (ref 35.0–47.0)
Hemoglobin: 13 g/dL (ref 12.0–16.0)
Lymphocytes Relative: 23 %
Lymphs Abs: 1.8 10*3/uL (ref 1.0–3.6)
MCH: 30.7 pg (ref 26.0–34.0)
MCHC: 33.2 g/dL (ref 32.0–36.0)
MCV: 92.6 fL (ref 80.0–100.0)
Monocytes Absolute: 0.8 10*3/uL (ref 0.2–0.9)
Monocytes Relative: 9 %
Neutro Abs: 5.4 10*3/uL (ref 1.4–6.5)
Neutrophils Relative %: 65 %
PLATELETS: 229 10*3/uL (ref 150–440)
RBC: 4.22 MIL/uL (ref 3.80–5.20)
RDW: 15.1 % — AB (ref 11.5–14.5)
WBC: 8.2 10*3/uL (ref 3.6–11.0)

## 2017-08-25 LAB — T4, FREE: Free T4: 1.2 ng/dL — ABNORMAL HIGH (ref 0.61–1.12)

## 2017-08-25 LAB — TROPONIN I
TROPONIN I: 0.29 ng/mL — AB (ref <0.03)
TROPONIN I: 0.26 ng/mL — AB (ref <0.03)

## 2017-08-25 MED ORDER — METOPROLOL TARTRATE 50 MG PO TABS: 50.0000 mg | ORAL_TABLET | Freq: Two times a day (BID) | ORAL | Status: DC

## 2017-08-25 MED ORDER — ACETAMINOPHEN 650 MG RE SUPP: 650.0000 mg | Freq: Four times a day (QID) | RECTAL | Status: DC | PRN

## 2017-08-25 MED ORDER — DOXYCYCLINE HYCLATE 100 MG PO TABS
100.0000 mg | ORAL_TABLET | Freq: Two times a day (BID) | ORAL | Status: DC
  Administered 2017-08-26 – 2017-08-27 (×3): 100 mg via ORAL
  Filled 2017-08-25 (×3): qty 1

## 2017-08-25 MED ORDER — HYDROCODONE-ACETAMINOPHEN 5-325 MG PO TABS: 1.0000 | ORAL_TABLET | ORAL | Status: DC | PRN

## 2017-08-25 MED ORDER — MAGNESIUM SULFATE 4 GM/100ML IV SOLN
4.0000 g | Freq: Once | INTRAVENOUS | Status: AC
  Administered 2017-08-25: 4 g via INTRAVENOUS
  Filled 2017-08-25: qty 100

## 2017-08-25 MED ORDER — PRAVASTATIN SODIUM 40 MG PO TABS
40.0000 mg | ORAL_TABLET | Freq: Every day | ORAL | Status: DC
  Administered 2017-08-25 – 2017-08-26 (×2): 40 mg via ORAL
  Filled 2017-08-25 (×3): qty 1

## 2017-08-25 MED ORDER — AZITHROMYCIN 250 MG PO TABS: 500.0000 mg | ORAL_TABLET | Freq: Every day | ORAL | Status: DC

## 2017-08-25 MED ORDER — ONDANSETRON HCL 4 MG PO TABS: 4.0000 mg | ORAL_TABLET | Freq: Four times a day (QID) | ORAL | Status: DC | PRN

## 2017-08-25 MED ORDER — DABIGATRAN ETEXILATE MESYLATE 150 MG PO CAPS
150.0000 mg | ORAL_CAPSULE | Freq: Two times a day (BID) | ORAL | Status: DC
  Administered 2017-08-25 – 2017-08-27 (×4): 150 mg via ORAL
  Filled 2017-08-25 (×5): qty 1

## 2017-08-25 MED ORDER — SODIUM CHLORIDE 0.9 % IV BOLUS
1000.0000 mL | Freq: Once | INTRAVENOUS | Status: AC
  Administered 2017-08-25: 1000 mL via INTRAVENOUS

## 2017-08-25 MED ORDER — PRAVASTATIN SODIUM 40 MG PO TABS: 40.0000 mg | ORAL_TABLET | Freq: Once | ORAL | Status: DC

## 2017-08-25 MED ORDER — LEVOTHYROXINE SODIUM 88 MCG PO TABS
88.0000 ug | ORAL_TABLET | Freq: Every day | ORAL | Status: DC
  Administered 2017-08-26 – 2017-08-27 (×2): 88 ug via ORAL
  Filled 2017-08-25 (×2): qty 1

## 2017-08-25 MED ORDER — SODIUM CHLORIDE 0.9% FLUSH
3.0000 mL | Freq: Two times a day (BID) | INTRAVENOUS | Status: DC
  Administered 2017-08-25 – 2017-08-27 (×4): 3 mL via INTRAVENOUS

## 2017-08-25 MED ORDER — ALBUTEROL SULFATE (2.5 MG/3ML) 0.083% IN NEBU: 2.5000 mg | INHALATION_SOLUTION | RESPIRATORY_TRACT | Status: DC | PRN

## 2017-08-25 MED ORDER — METOPROLOL TARTRATE 5 MG/5ML IV SOLN: 2.5000 mg | Freq: Four times a day (QID) | INTRAVENOUS | Status: DC | PRN

## 2017-08-25 MED ORDER — ASPIRIN 81 MG PO CHEW
162.0000 mg | CHEWABLE_TABLET | Freq: Once | ORAL | Status: AC
  Administered 2017-08-25: 162 mg via ORAL
  Filled 2017-08-25: qty 2

## 2017-08-25 MED ORDER — AZITHROMYCIN 250 MG PO TABS: 250.0000 mg | ORAL_TABLET | Freq: Every day | ORAL | Status: DC

## 2017-08-25 MED ORDER — METOPROLOL TARTRATE 50 MG PO TABS
50.0000 mg | ORAL_TABLET | Freq: Once | ORAL | Status: AC
  Administered 2017-08-25: 50 mg via ORAL
  Filled 2017-08-25: qty 1

## 2017-08-25 MED ORDER — ONDANSETRON HCL 4 MG/2ML IJ SOLN: 4.0000 mg | Freq: Four times a day (QID) | INTRAMUSCULAR | Status: DC | PRN

## 2017-08-25 MED ORDER — TRAMADOL HCL 50 MG PO TABS: 50.0000 mg | ORAL_TABLET | Freq: Four times a day (QID) | ORAL | Status: DC | PRN

## 2017-08-25 MED ORDER — ACETAMINOPHEN 325 MG PO TABS: 650.0000 mg | ORAL_TABLET | Freq: Four times a day (QID) | ORAL | Status: DC | PRN

## 2017-08-25 MED ORDER — POLYETHYLENE GLYCOL 3350 17 G PO PACK: 17.0000 g | PACK | Freq: Every day | ORAL | Status: DC | PRN

## 2017-08-25 MED ORDER — GUAIFENESIN-DM 100-10 MG/5ML PO SYRP
5.0000 mL | ORAL_SOLUTION | ORAL | Status: DC | PRN
Start: 2017-08-25 — End: 2017-08-27
  Administered 2017-08-27: 5 mL via ORAL
  Filled 2017-08-25 (×2): qty 5

## 2017-08-25 MED ORDER — METOPROLOL TARTRATE 5 MG/5ML IV SOLN
2.5000 mg | Freq: Once | INTRAVENOUS | Status: AC
  Administered 2017-08-25: 2.5 mg via INTRAVENOUS
  Filled 2017-08-25: qty 5

## 2017-08-25 MED ORDER — METOPROLOL TARTRATE 50 MG PO TABS
75.0000 mg | ORAL_TABLET | Freq: Two times a day (BID) | ORAL | Status: DC
  Administered 2017-08-26 – 2017-08-27 (×3): 75 mg via ORAL
  Filled 2017-08-25 (×3): qty 1

## 2017-08-25 MED ORDER — PANTOPRAZOLE SODIUM 40 MG PO TBEC
40.0000 mg | DELAYED_RELEASE_TABLET | Freq: Every day | ORAL | Status: DC
  Administered 2017-08-26 – 2017-08-27 (×2): 40 mg via ORAL
  Filled 2017-08-25 (×2): qty 1

## 2017-08-25 MED ORDER — PREDNISONE 10 MG PO TABS
10.0000 mg | ORAL_TABLET | Freq: Every day | ORAL | Status: DC
  Filled 2017-08-25: qty 1

## 2017-08-25 NOTE — H&P (Signed)
[employer] - [place] at [facility]   PATIENT NAME: Quetzalli Clos    MR#:  [id number]  DATE OF BIRTH:  May 18, 1930  DATE OF ADMISSION:  08/25/2017  PRIMARY CARE PHYSICIAN: Marisue Ivan, MD   REQUESTING/REFERRING PHYSICIAN: Dr. Lamont Snowball  CHIEF COMPLAINT:   Chief Complaint  Patient presents with  . Irregular Heart Beat    HISTORY OF PRESENT ILLNESS:  Tracina Beaumont  is a 84 y.o. female with a known history of atrial fibrillation, CAD, hypertension presents to the emergency room sent in from [facility] walk-in clinic due to atrial fibrillation with tachycardia with heart rate in the 140s-150s.  Patient has had cough with productive yellow sputum with streaks of blood over the last 4-5 days.  No chest pain or shortness of breath.  Has had fatigue.  No fever.  She was told she has upper respiratory infection and treated in the walk-in clinic.  They found out that her heart rate was in the 140s and sent to the ED.  She has not had any lightheadedness or palpitations.  Patient takes metoprolol and Pradaxa which have remained unchanged.  She had normal stress test earlier in the year.  Follows with Dr. Higinio Roger at [facility] clinic cardiology. Today her EKG showed no ST changes but troponin is 0.29.  PAST MEDICAL HISTORY:   Past Medical History:  Diagnosis Date  . Atrial fibrillation (HCC) 2011  . Hyperlipidemia   . Hypertension   . Stroke (HCC)   . Thyroid disease     PAST SURGICAL HISTORY:   Past Surgical History:  Procedure Laterality Date  . ABDOMINAL HYSTERECTOMY    . BREAST BIOPSY Left 1980   Negative    SOCIAL HISTORY:   Social History   Tobacco Use  . Smoking status: Never Smoker  . Smokeless tobacco: Never Used  Substance Use Topics  . Alcohol use: No    FAMILY HISTORY:   Family History  Problem Relation Age of Onset  . Breast cancer Maternal Grandmother        60's    DRUG ALLERGIES:   Allergies  Allergen Reactions  . Oxycodone      REVIEW OF SYSTEMS:   Review of Systems  Constitutional: Positive for malaise/fatigue. Negative for chills, fever and weight loss.  HENT: Negative for hearing loss and nosebleeds.   Eyes: Negative for blurred vision, double vision and pain.  Respiratory: Positive for cough, hemoptysis and sputum production. Negative for shortness of breath and wheezing.   Cardiovascular: Negative for chest pain, palpitations, orthopnea and leg swelling.  Gastrointestinal: Negative for abdominal pain, constipation, diarrhea, nausea and vomiting.  Genitourinary: Negative for dysuria and hematuria.  Musculoskeletal: Negative for back pain, falls and myalgias.  Skin: Negative for rash.  Neurological: Negative for dizziness, tremors, sensory change, speech change, focal weakness, seizures and headaches.  Endo/Heme/Allergies: Does not bruise/bleed easily.  Psychiatric/Behavioral: Negative for depression and memory loss. The patient is not nervous/anxious.     MEDICATIONS AT HOME:   Prior to Admission medications   Medication Sig Start Date End Date Taking? Authorizing Provider  Calcium Carbonate-Vitamin D 600-400 MG-UNIT per tablet Take 1 tablet by mouth 2 (two) times daily.    [provider]  HYDROcodone-acetaminophen (NORCO/VICODIN) 5-325 MG tablet Take 1 tablet by mouth every 4 (four) hours as needed for severe pain. 08/07/17   Cuthriell, Delorise Royals, PA-C  levothyroxine (SYNTHROID, LEVOTHROID) 88 MCG tablet Take 1 tablet by mouth every morning. 10/27/14 10/27/15  [provider]  lovastatin (MEVACOR) 40 MG tablet Take 1 tablet by mouth at bedtime. 10/27/14   [provider]  metoprolol succinate (TOPROL-XL) 100 MG 24 hr tablet Take 1 tablet by mouth every morning. 04/27/14   [provider]  nitroGLYCERIN (NITROSTAT) 0.4 MG SL tablet Place 1 tablet (0.4 mg total) under the tongue every 5 (five) minutes as needed for chest pain. 11/23/14 11/23/15  Emily Filbert, MD   omeprazole (PRILOSEC) 20 MG capsule Take 1 tablet by mouth daily. 11/30/13   [provider]  PRADAXA 150 MG CAPS capsule Take 1 tablet by mouth 2 (two) times daily. 09/25/14   [provider]  predniSONE (DELTASONE) 10 MG tablet Take 1 tablet (10 mg total) by mouth daily. 08/07/17   Cuthriell, Delorise Royals, PA-C  traMADol (ULTRAM) 50 MG tablet Take 1 tablet (50 mg total) by mouth every 6 (six) hours as needed. 03/16/15   Triplett, Rulon Eisenmenger B, FNP     VITAL SIGNS:  Blood pressure 99/74, pulse (!) 134, resp. rate 20, height 5\' 3"  (1.6 m), weight 66.7 kg (147 lb), SpO2 95 %.  PHYSICAL EXAMINATION:  Physical Exam  GENERAL:  84 y.o.-year-old patient lying in the bed with no acute distress.  EYES: Pupils equal, round, reactive to light and accommodation. No scleral icterus. Extraocular muscles intact.  HEENT: Head atraumatic, normocephalic. Oropharynx and nasopharynx clear. No oropharyngeal erythema, moist oral mucosa  NECK:  Supple, no jugular venous distention. No thyroid enlargement, no tenderness.  LUNGS: Normal breath sounds bilaterally, no wheezing, rales, rhonchi. No use of accessory muscles of respiration.  CARDIOVASCULAR: Irregularly irregular and tachycardic ABDOMEN: Soft, nontender, nondistended. Bowel sounds present. No organomegaly or mass.  EXTREMITIES: No pedal edema, cyanosis, or clubbing. + 2 pedal & radial pulses b/l.   NEUROLOGIC: Cranial nerves II through XII are intact. No focal Motor or sensory deficits appreciated b/l PSYCHIATRIC: The patient is alert and oriented x 3. Good affect.  SKIN: No obvious rash, lesion, or ulcer.   LABORATORY PANEL:   CBC Recent Labs  Lab 08/25/17 1743  WBC 8.2  HGB 13.0  HCT 39.1  PLT 229   ------------------------------------------------------------------------------------------------------------------  Chemistries  Recent Labs  Lab 08/25/17 1938  NA 137  K 4.0  CL 109  CO2 22  GLUCOSE 90  BUN 20  CREATININE 0.89   CALCIUM 8.3*  AST 19  ALT 18  ALKPHOS 51  BILITOT 0.6   ------------------------------------------------------------------------------------------------------------------  Cardiac Enzymes Recent Labs  Lab 08/25/17 1743  TROPONINI 0.29*   ------------------------------------------------------------------------------------------------------------------  RADIOLOGY:  Dg Chest 2 View  Result Date: 08/25/2017 CLINICAL DATA:  Irregular tachycardia. EXAM: CHEST - 2 VIEW COMPARISON:  June 13, 2017 FINDINGS: Mild atelectasis in the left lung base. The heart, hila, mediastinum, lungs, and pleura are otherwise unremarkable. IMPRESSION: No active cardiopulmonary disease. Electronically Signed   By: Gerome Sam III M.D   On: 08/25/2017 18:13     IMPRESSION AND PLAN:   *Atrial fibrillation with rapid ventricular rate.  Patient has received IV metoprolol 2.5 mg and oral dose of 50 mg in the ER.  At this time patient will be admitted to telemetry unit.  Will increase her metoprolol to 75 mg twice daily.  Continue Pradaxa. IV as needed Lopressor  *Elevated troponin likely due to atrial fibrillation.  No chest pain or shortness of breath at this time.  EKG shows no acute ST elevation.  Will trend troponin.  Patient is anticoagulated with Pradaxa.  Consult cardiology. Patient had normal stress echocardiogram  earlier this year.  *Hypertension.  Continue metoprolol  *Hemoptysis.  Very minimal.  Likely due to coughing and being on Pradaxa.  Monitor.  All the records are reviewed and case discussed with ED provider. Management plans discussed with the patient, family and they are in agreement.  CODE STATUS: DNR  TOTAL TIME TAKING CARE OF THIS PATIENT: 35 minutes.   Orie FishermanSrikar R Breyona Swander M.D on 08/25/2017 at 8:41 PM  Between 7am to 6pm - Pager - [phone]  After 6pm go to [email or web address] - password [email or web address] [facility]  [facility] [place] [facility]  Office  [phone]  CC: Primary care physician;  Marisue IvanLinthavong, Kanhka, MD  Note: This dictation was prepared with Dragon dictation along with smaller phrase technology. Any transcriptional errors that result from this process are unintentional.

## 2017-08-25 NOTE — Progress Notes (Signed)
Patient admitted w/ afib w/ RVR w/ HR's 120's - 140's. EKG from 04/14 @ 1700 shows QTc of 511  Patient also has a URI treated outpatient w/ cefdinir, in-house is to be started on Z-pack. However, considering patient's QTc status spoke w/ MD to switch to doxycycline to not prolong the QTc interval.  Spoke to MD and agrees w/ plan, will start doxycycline 100 mg PO bid.  Thomasene Rippleavid Michelle Wnek, PharmD, BCPS Clinical Pharmacist 08/25/2017

## 2017-08-25 NOTE — ED Triage Notes (Signed)
Pt comes into the ED via POV c/o irregular fast heart rate.  [facility] where they noticed that it was 140's.  Patient denies any chest pain, shortness of breath, or dizziness.  Patient in NAD at this time with even and unlabored respirations.

## 2017-08-25 NOTE — ED Notes (Signed)
Patient has good color, skin is warm and dry. No complaints of discomfort. Family at bedside.

## 2017-08-25 NOTE — ED Provider Notes (Signed)
[facility] Emergency Department Provider Note  ____________________________________________   First MD Initiated Contact with Patient 08/25/17 1747     (approximate)  I have reviewed the triage vital signs and the nursing notes.   HISTORY  Chief Complaint Irregular Heart Beat   HPI Courtney Chan is a 84 y.o. female who [facility] fibrillation with rapid ventricular response.  The patient has a long-standing history of paroxysmal atrial fibrillation however she actually went to clinic today for cough and congestion for the past several days.  She has not noted any chest pain, palpitations, or shortness of breath.  She is anticoagulated with Pradaxa and has not missed any doses.  Her symptoms have been gradual onset are now constant mild in severity.  Past Medical History:  Diagnosis Date  . Atrial fibrillation (HCC) 2011  . Hyperlipidemia   . Hypertension   . Stroke (HCC)   . Thyroid disease     Patient Active Problem List   Diagnosis Date Noted  . Atrial fibrillation (HCC) 08/26/2017  . A-fib (HCC) 08/25/2017    Past Surgical History:  Procedure Laterality Date  . ABDOMINAL HYSTERECTOMY    . BREAST BIOPSY Left 1980   Negative    Prior to Admission medications   Medication Sig Start Date End Date Taking? Authorizing Provider  Calcium Carbonate-Vitamin D 600-400 MG-UNIT per tablet Take 1 tablet by mouth 2 (two) times daily.   Yes [provider]  levothyroxine (SYNTHROID, LEVOTHROID) 88 MCG tablet Take 1 tablet by mouth every morning. 10/27/14 08/25/17 Yes [provider]  lovastatin (MEVACOR) 40 MG tablet Take 1 tablet by mouth at bedtime. 10/27/14  Yes [provider]  metoprolol succinate (TOPROL-XL) 100 MG 24 hr tablet Take 1 tablet by mouth every morning. 04/27/14  Yes [provider]  omeprazole (PRILOSEC) 20 MG capsule Take 1 tablet by mouth  daily. 11/30/13  Yes [provider]  PRADAXA 150 MG CAPS capsule Take 1 tablet by mouth 2 (two) times daily. 09/25/14  Yes [provider]  dabigatran (PRADAXA) 75 MG CAPS capsule Take 1 capsule (75 mg total) by mouth 2 (two) times daily. 08/27/17   Delfino LovettShah, Vipul, MD  digoxin 62.5 MCG TABS Take 0.0625 mg by mouth daily. 08/28/17   Delfino LovettShah, Vipul, MD  doxycycline (VIBRA-TABS) 100 MG tablet Take 1 tablet (100 mg total) by mouth every 12 (twelve) hours. 08/27/17   Delfino LovettShah, Vipul, MD  guaiFENesin-dextromethorphan (ROBITUSSIN DM) 100-10 MG/5ML syrup Take 5 mLs by mouth every 4 (four) hours as needed for cough. 08/27/17   Delfino LovettShah, Vipul, MD  HYDROcodone-acetaminophen (NORCO/VICODIN) 5-325 MG tablet Take 1 tablet by mouth every 4 (four) hours as needed for severe pain. Patient not taking: Reported on 08/25/2017 08/07/17   Cuthriell, Delorise RoyalsJonathan D, PA-C  nitroGLYCERIN (NITROSTAT) 0.4 MG SL tablet Place 1 tablet (0.4 mg total) under the tongue every 5 (five) minutes as needed for chest pain. 11/23/14 11/23/15  Emily FilbertWilliams, Jonathan E, MD    Allergies Oxycodone  Family History  Problem Relation Age of Onset  . Breast cancer Maternal Grandmother        6560's    Social History Social History   Tobacco Use  . Smoking status: Never Smoker  . Smokeless tobacco: Never Used  Substance Use Topics  . Alcohol use: No  . Drug use: No    Review of Systems Constitutional: No fever/chills Eyes: No visual changes. ENT: No sore throat.  Cardiovascular: Denies chest pain. Respiratory: Denies shortness of breath. Gastrointestinal: No abdominal pain.  No nausea, no vomiting.  No diarrhea.  No constipation. Genitourinary: Negative for dysuria. Musculoskeletal: Negative for back pain. Skin: Negative for rash. Neurological: Negative for headaches, focal weakness or numbness.   ____________________________________________   PHYSICAL EXAM:  VITAL SIGNS: ED Triage Vitals [08/25/17 1741]  Enc Vitals Group      BP      Pulse      Resp      Temp      Temp src      SpO2      Weight 147 lb (66.7 kg)     Height 5\' 3"  (1.6 m)     Head Circumference      Peak Flow      Pain Score 0     Pain Loc      Pain Edu?      Excl. in GC?     Constitutional: Alert and oriented x4 pleasant cooperative speaks in full clear sentences no diaphoresis Eyes: PERRL EOMI. Head: Atraumatic. Nose: No congestion/rhinnorhea. Mouth/Throat: No trismus Neck: No stridor.   Cardiovascular: Irregularly irregular and tachycardic Respiratory: Normal respiratory effort.  No retractions. Lungs CTAB and moving good air Gastrointestinal: Soft nontender Musculoskeletal: No lower extremity edema   Neurologic:  Normal speech and language. No gross focal neurologic deficits are appreciated. Skin:  Skin is warm, dry and intact. No rash noted. Psychiatric: Mood and affect are normal. Speech and behavior are normal.    ____________________________________________   DIFFERENTIAL includes but not limited to  Atrial fibrillation with rapid ventricular response, atrial flutter with rapid ventricular response, acute coronary syndrome, dehydration, metabolic derangement, hyperthyroidism ____________________________________________   LABS (all labs ordered are listed, but only abnormal results are displayed)  Labs Reviewed  TROPONIN I - Abnormal; Notable for the following components:      Result Value   Troponin I 0.29 (*)    All other components within normal limits  CBC WITH DIFFERENTIAL/PLATELET - Abnormal; Notable for the following components:   RDW 15.1 (*)    All other components within normal limits  T4, FREE - Abnormal; Notable for the following components:   Free T4 1.20 (*)    All other components within normal limits  COMPREHENSIVE METABOLIC PANEL - Abnormal; Notable for the following components:   Calcium 8.3 (*)    Total Protein 5.8 (*)    Albumin 3.4 (*)    GFR calc non Af Amer 58 (*)    All other components  within normal limits  TROPONIN I - Abnormal; Notable for the following components:   Troponin I 0.26 (*)    All other components within normal limits  TROPONIN I - Abnormal; Notable for the following components:   Troponin I 0.22 (*)    All other components within normal limits  BASIC METABOLIC PANEL - Abnormal; Notable for the following components:   Calcium 8.3 (*)    GFR calc non Af Amer 56 (*)    All other components within normal limits  BASIC METABOLIC PANEL - Abnormal; Notable for the following components:   Glucose, Bld 123 (*)    BUN 27 (*)    Creatinine, Ser 1.17 (*)    Calcium 8.7 (*)    GFR calc non Af Amer 42 (*)    GFR calc Af Amer 48 (*)    All other components within normal limits  TSH  CBC    Lab work reviewed by  me with elevated troponin which is nonspecific and could be secondary to demand versus acute coronary syndrome __________________________________________  EKG  ED ECG REPORT I, Merrily Brittle, the attending physician, personally viewed and interpreted this ECG.  Date: 08/25/2017 EKG Time:  Rate: 148 Rhythm: Atrial fibrillation with rapid ventricular response QRS Axis: normal Intervals: normal ST/T Wave abnormalities: Diffuse ST depression with slight elevation in aVR consistent with rate related changes Narrative Interpretation: Rate related ST changes  ____________________________________________  RADIOLOGY  Chest x-ray reviewed by me with no acute disease ____________________________________________   PROCEDURES  Procedure(s) performed: no  .Critical Care Performed by: Merrily Brittle, MD Authorized by: Merrily Brittle, MD   Critical care provider statement:    Critical care time (minutes):  30   Critical care time was exclusive of:  Separately billable procedures and treating other patients   Critical care was necessary to treat or prevent imminent or life-threatening deterioration of the following conditions:  Cardiac failure    Critical care was time spent personally by me on the following activities:  Development of treatment plan with patient or surrogate, discussions with consultants, evaluation of patient's response to treatment, examination of patient, obtaining history from patient or surrogate, ordering and performing treatments and interventions, ordering and review of laboratory studies, ordering and review of radiographic studies, pulse oximetry, re-evaluation of patient's condition and review of old charts    Critical Care performed: Yes  Observation: no ____________________________________________   INITIAL IMPRESSION / ASSESSMENT AND PLAN / ED COURSE  Pertinent labs & imaging results that were available during my care of the patient were reviewed by me and considered in my medical decision making (see chart for details).  On arrival the patient is largely asymptomatic although in what appears to be either atrial fibrillation or atrial flutter with rapid ventricular response in 2-1 conduction.  I discussed the case with on-call cardiologist Dr. Juliann Pares who indicated it would be reasonable to consider DC cardioversion as the patient has a recent echocardiogram with no clot and has not missed any doses of her Pradaxa.  Lab work is pending to evaluate for an etiology of her symptoms.  4 g of magnesium and an IV dose of metoprolol as well as oral dose now to help control her heart rate in the meantime.    ----------------------------------------- 8:23 PM on 08/25/2017 -----------------------------------------  The patient's heart rate is now down to about 115 with a normal blood pressure.  Her troponin came up unexpectedly quite elevated to 0.29 with normal renal function.  At this point I am concerned that she may very well have active cardiac ischemia and will give her aspirin.  I am uncomfortable with DC cardioversion given the possibility of acute ischemia and as she is currently rate controlled.  She  will require inpatient admission for serial troponins and to to continue to evaluate for possible demand ischemia versus acute coronary syndrome.  ____________________________________________   FINAL CLINICAL IMPRESSION(S) / ED DIAGNOSES  Final diagnoses:  Atrial fibrillation with RVR (HCC)  Elevated troponin      NEW MEDICATIONS STARTED DURING THIS VISIT:  Discharge Medication List as of 08/27/2017  4:10 PM    START taking these medications   Details  doxycycline (VIBRA-TABS) 100 MG tablet Take 1 tablet (100 mg total) by mouth every 12 (twelve) hours., Starting Tue 08/27/2017, Normal    guaiFENesin-dextromethorphan (ROBITUSSIN DM) 100-10 MG/5ML syrup Take 5 mLs by mouth every 4 (four) hours as needed for cough., Starting Tue 08/27/2017, Normal  Note:  This document was prepared using Dragon voice recognition software and may include unintentional dictation errors.     Merrily Brittle, MD 08/28/17 2236

## 2017-08-25 NOTE — ED Notes (Signed)
Report given to Rodell Pernaajea Scott RN

## 2017-08-26 DIAGNOSIS — Z90710 Acquired absence of both cervix and uterus: Secondary | ICD-10-CM | POA: Diagnosis not present

## 2017-08-26 DIAGNOSIS — Z79890 Hormone replacement therapy (postmenopausal): Secondary | ICD-10-CM | POA: Diagnosis not present

## 2017-08-26 DIAGNOSIS — I480 Paroxysmal atrial fibrillation: Secondary | ICD-10-CM | POA: Diagnosis present

## 2017-08-26 DIAGNOSIS — Z955 Presence of coronary angioplasty implant and graft: Secondary | ICD-10-CM | POA: Diagnosis not present

## 2017-08-26 DIAGNOSIS — Z79899 Other long term (current) drug therapy: Secondary | ICD-10-CM | POA: Diagnosis not present

## 2017-08-26 DIAGNOSIS — Z66 Do not resuscitate: Secondary | ICD-10-CM | POA: Diagnosis present

## 2017-08-26 DIAGNOSIS — Z7902 Long term (current) use of antithrombotics/antiplatelets: Secondary | ICD-10-CM | POA: Diagnosis not present

## 2017-08-26 DIAGNOSIS — I248 Other forms of acute ischemic heart disease: Secondary | ICD-10-CM | POA: Diagnosis present

## 2017-08-26 DIAGNOSIS — I10 Essential (primary) hypertension: Secondary | ICD-10-CM | POA: Diagnosis present

## 2017-08-26 DIAGNOSIS — Z8673 Personal history of transient ischemic attack (TIA), and cerebral infarction without residual deficits: Secondary | ICD-10-CM | POA: Diagnosis not present

## 2017-08-26 DIAGNOSIS — R748 Abnormal levels of other serum enzymes: Secondary | ICD-10-CM | POA: Diagnosis present

## 2017-08-26 DIAGNOSIS — T45525A Adverse effect of antithrombotic drugs, initial encounter: Secondary | ICD-10-CM | POA: Diagnosis present

## 2017-08-26 DIAGNOSIS — Z7901 Long term (current) use of anticoagulants: Secondary | ICD-10-CM | POA: Diagnosis not present

## 2017-08-26 DIAGNOSIS — R042 Hemoptysis: Secondary | ICD-10-CM | POA: Diagnosis present

## 2017-08-26 DIAGNOSIS — I4891 Unspecified atrial fibrillation: Secondary | ICD-10-CM

## 2017-08-26 DIAGNOSIS — I2510 Atherosclerotic heart disease of native coronary artery without angina pectoris: Secondary | ICD-10-CM | POA: Diagnosis present

## 2017-08-26 DIAGNOSIS — E785 Hyperlipidemia, unspecified: Secondary | ICD-10-CM | POA: Diagnosis present

## 2017-08-26 LAB — BASIC METABOLIC PANEL
Anion gap: 5 (ref 5–15)
BUN: 19 mg/dL (ref 6–20)
CALCIUM: 8.3 mg/dL — AB (ref 8.9–10.3)
CO2: 25 mmol/L (ref 22–32)
CREATININE: 0.92 mg/dL (ref 0.44–1.00)
Chloride: 108 mmol/L (ref 101–111)
GFR calc Af Amer: >60 mL/min (ref >60)
GFR calc non Af Amer: 56 mL/min — ABNORMAL LOW (ref >60)
Glucose, Bld: 83 mg/dL (ref 65–99)
Potassium: 4 mmol/L (ref 3.5–5.1)
SODIUM: 138 mmol/L (ref 135–145)

## 2017-08-26 LAB — TROPONIN I: Troponin I: 0.22 ng/mL (ref <0.03)

## 2017-08-26 MED ORDER — METOPROLOL TARTRATE 5 MG/5ML IV SOLN
5.0000 mg | Freq: Four times a day (QID) | INTRAVENOUS | Status: DC | PRN
  Administered 2017-08-26: 5 mg via INTRAVENOUS
  Filled 2017-08-26: qty 5

## 2017-08-26 MED ORDER — DIGOXIN 125 MCG PO TABS
0.1250 mg | ORAL_TABLET | Freq: Every day | ORAL | Status: DC
  Administered 2017-08-26 – 2017-08-27 (×2): 0.125 mg via ORAL
  Filled 2017-08-26 (×2): qty 1

## 2017-08-26 NOTE — Consult Note (Signed)
Cardiology Consultation Note    Patient ID: Courtney Chan, MRN: [id number], DOB/AGE: May 14, 1931 84 y.o. Admit date: 08/25/2017   Date of Consult: 08/26/2017 Primary Physician: Marisue Ivan, MD Primary Cardiologist: Dr. Darrold Junker  Chief Complaint: rapid heart rate Reason for Consultation: elevated troponin and afib with rvr Requesting MD: Dr. Sherryll Burger  HPI: Courtney Chan is a 84 y.o. female with history of chronic atrial fibrillation treated with metoprolol for rate control and Pradaxa for anticoagulation, history of hypertension, hyperlipidemia, history of coronary artery disease status post PCI with a bare metal stent in the left circumflex and mid RCA in 2010 who presented to the emergency room with a sensation of a rapid heart rate.  She denied chest pain but felt her heart racing.  She has been evaluated with a stress echo in June 28, 2017 where she went 6 minutes on a Bruce protocol with no ischemic changes.  She has been compliant with her medications.  Her initial serum troponin was mildly elevated at 0.29.  Subsequent troponins have not increased.  They have remained steady.  She remains in atrial fibrillation with fairly rapid ventricular response.  Past Medical History:  Diagnosis Date  . Atrial fibrillation (HCC) 2011  . Hyperlipidemia   . Hypertension   . Stroke (HCC)   . Thyroid disease       Surgical History:  Past Surgical History:  Procedure Laterality Date  . ABDOMINAL HYSTERECTOMY    . BREAST BIOPSY Left 1980   Negative     Home Meds: Prior to Admission medications   Medication Sig Start Date End Date Taking? Authorizing Provider  Calcium Carbonate-Vitamin D 600-400 MG-UNIT per tablet Take 1 tablet by mouth 2 (two) times daily.   Yes [provider]  cefdinir (OMNICEF) 300 MG capsule Take 1 capsule by mouth 2 (two) times daily. 08/25/17  Yes [provider]  levothyroxine (SYNTHROID, LEVOTHROID) 88 MCG tablet Take 1 tablet by mouth  every morning. 10/27/14 08/25/17 Yes [provider]  lovastatin (MEVACOR) 40 MG tablet Take 1 tablet by mouth at bedtime. 10/27/14  Yes [provider]  metoprolol succinate (TOPROL-XL) 100 MG 24 hr tablet Take 1 tablet by mouth every morning. 04/27/14  Yes [provider]  omeprazole (PRILOSEC) 20 MG capsule Take 1 tablet by mouth daily. 11/30/13  Yes [provider]  PRADAXA 150 MG CAPS capsule Take 1 tablet by mouth 2 (two) times daily. 09/25/14  Yes [provider]  HYDROcodone-acetaminophen (NORCO/VICODIN) 5-325 MG tablet Take 1 tablet by mouth every 4 (four) hours as needed for severe pain. Patient not taking: Reported on 08/25/2017 08/07/17   Cuthriell, Delorise Royals, PA-C  nitroGLYCERIN (NITROSTAT) 0.4 MG SL tablet Place 1 tablet (0.4 mg total) under the tongue every 5 (five) minutes as needed for chest pain. 11/23/14 11/23/15  Emily Filbert, MD    Inpatient Medications:  . dabigatran  150 mg Oral BID  . digoxin  0.125 mg Oral Daily  . doxycycline  100 mg Oral Q12H  . levothyroxine  88 mcg Oral QAC breakfast  . metoprolol tartrate  75 mg Oral BID  . pantoprazole  40 mg Oral Daily  . pravastatin  40 mg Oral q1800  . predniSONE  10 mg Oral Daily  . sodium chloride flush  3 mL Intravenous Q12H     Allergies:  Allergies  Allergen Reactions  . Oxycodone     Social History   Socioeconomic History  . Marital status: Married  Spouse name: Not on file  . Number of children: Not on file  . Years of education: Not on file  . Highest education level: Not on file  Occupational History  . Not on file  Social Needs  . Financial resource strain: Not on file  . Food insecurity:    Worry: Not on file    Inability: Not on file  . Transportation needs:    Medical: Not on file    Non-medical: Not on file  Tobacco Use  . Smoking status: Never Smoker  . Smokeless tobacco: Never Used  Substance and Sexual Activity  . Alcohol use: No  .  Drug use: No  . Sexual activity: Not on file  Lifestyle  . Physical activity:    Days per week: Not on file    Minutes per session: Not on file  . Stress: Not on file  Relationships  . Social connections:    Talks on phone: Not on file    Gets together: Not on file    Attends religious service: Not on file    Active member of club or organization: Not on file    Attends meetings of clubs or organizations: Not on file    Relationship status: Not on file  . Intimate partner violence:    Fear of current or ex partner: Not on file    Emotionally abused: Not on file    Physically abused: Not on file    Forced sexual activity: Not on file  Other Topics Concern  . Not on file  Social History Narrative  . Not on file     Family History  Problem Relation Age of Onset  . Breast cancer Maternal Grandmother        60's     Review of Systems: A 12-system review of systems was performed and is negative except as noted in the HPI.  Labs: Recent Labs    08/25/17 1743 08/25/17 2238 08/26/17 0434  TROPONINI 0.29* 0.26* 0.22*   Lab Results  Component Value Date   WBC 8.2 08/25/2017   HGB 13.0 08/25/2017   HCT 39.1 08/25/2017   MCV 92.6 08/25/2017   PLT 229 08/25/2017    Recent Labs  Lab 08/25/17 1938 08/26/17 0434  NA 137 138  K 4.0 4.0  CL 109 108  CO2 22 25  BUN 20 19  CREATININE 0.89 0.92  CALCIUM 8.3* 8.3*  PROT 5.8*  --   BILITOT 0.6  --   ALKPHOS 51  --   ALT 18  --   AST 19  --   GLUCOSE 90 83   No results found for: CHOL, HDL, LDLCALC, TRIG No results found for: DDIMER  Radiology/Studies:  Dg Chest 2 View  Result Date: 08/25/2017 CLINICAL DATA:  Irregular tachycardia. EXAM: CHEST - 2 VIEW COMPARISON:  June 13, 2017 FINDINGS: Mild atelectasis in the left lung base. The heart, hila, mediastinum, lungs, and pleura are otherwise unremarkable. IMPRESSION: No active cardiopulmonary disease. Electronically Signed   By: Gerome Sam III M.D   On:  08/25/2017 18:13   Ct Hip Left Wo Contrast  Result Date: 08/07/2017 CLINICAL DATA:  Left hip pain, difficulty walking EXAM: CT OF THE LEFT HIP WITHOUT CONTRAST TECHNIQUE: Multidetector CT imaging of the left hip was performed according to the standard protocol. Multiplanar CT image reconstructions were also generated. COMPARISON:  Left hip radiographs dated 08/08/2014 FINDINGS: No evidence of fracture or dislocation. Specifically, the left femoral neck, is intact. The  visualized bony pelvis, including the left superior and inferior pubic rami, are intact. Mild degenerative changes of the left hip. No appreciable hip joint effusion. No evidence of intramuscular hematoma. Subcutaneous tissues are unremarkable. Visualized soft tissue pelvis is notable for a sigmoid diverticulosis, without evidence of diverticulitis. IMPRESSION: No fracture or dislocation is seen. Electronically Signed   By: Charline BillsSriyesh  Krishnan M.D.   On: 08/07/2017 18:38   Dg Hip Unilat W Or Wo Pelvis 2-3 Views Left  Result Date: 08/07/2017 CLINICAL DATA:  Hip and groin pain, gradually increased since walking with husband yesterday, no specific injury EXAM: DG HIP (WITH OR WITHOUT PELVIS) 2-3V LEFT COMPARISON:  None FINDINGS: Diffuse osseous demineralization. SI joints preserved. BILATERAL hip joint space narrowing and minimal spur formation consistent with degenerative changes. Minimal osteitis pubis. No acute fracture, dislocation, or bone destruction. Degenerative disc and facet disease changes at visualized lower lumbar spine. Small pelvic phleboliths. IMPRESSION: Degenerative changes of both hip joints and the lower lumbar spine. Electronically Signed   By: Ulyses SouthwardMark  Boles M.D.   On: 08/07/2017 17:11    Wt Readings from Last 3 Encounters:  08/26/17 67.4 kg (148 lb 8 oz)  08/07/17 66.7 kg (147 lb)  06/13/17 68 kg (150 lb)    EKG: Atrial fibrillation with rapid ventricular response.  No ischemic changes.  Physical Exam:  Blood pressure  123/75, pulse 76, temperature 98.2 F (36.8 C), resp. rate 18, height 5\' 3"  (1.6 m), weight 67.4 kg (148 lb 8 oz), SpO2 97 %. Body mass index is 26.31 kg/m. General: Well developed, well nourished, in no acute distress. Head: Normocephalic, atraumatic, sclera non-icteric, no xanthomas, nares are without discharge.  Neck: Negative for carotid bruits. JVD not elevated. Lungs: Clear bilaterally to auscultation without wheezes, rales, or rhonchi. Breathing is unlabored. Heart: Irregular irregular rhythm with S1 S2. No murmurs, rubs, or gallops appreciated. Abdomen: Soft, non-tender, non-distended with normoactive bowel sounds. No hepatomegaly. No rebound/guarding. No obvious abdominal masses. Msk:  Strength and tone appear normal for age. Extremities: No clubbing or cyanosis. No edema.  Distal pedal pulses are 2+ and equal bilaterally. Neuro: Alert and oriented X 3. No facial asymmetry. No focal deficit. Moves all extremities spontaneously. Psych:  Responds to questions appropriately with a normal affect.     Assessment and Plan  84 year old female with history of paroxysmal atrial fibrillation, coronary disease, hyperlipidemia and hypertension who was admitted after presenting to the emergency room with atrial fibrillation with rapid ventricular response.  She has a mild serum troponin elevation.  Rate is improved with IV followed by an increased dose of p.o. metoprolol.  The metoprolol as an outpatient was 100 mg daily.  She is currently on 75 mg twice daily of metoprolol tartrate.  She has been continued with Pradaxa.  Atrial fibrillation-would continue with metoprolol at 75 mg twice daily for now and add low-dose digoxin due to relative hypotension.  We will continue with Pradaxa at 150 mg twice daily.  Renal function is unremarkable.  She has a chads score of 7.  Elevated troponin-likely secondary to demand ischemia with A. fib with rapid ventricular response.  Does not appear to have had an  acute coronary syndrome event.  We will continue with Pradaxa and attempt rate control as mentioned above.  Would not proceed with left heart cath at this time.  We will continue with low-dose aspirin.  Hyperlipidemia-continue lovastatin at 40 mg daily.  Would ambulate this afternoon and if stable consider discharge with early outpatient follow-up  with Dr. Darrold Junker  Signed, Dalia Heading MD 08/26/2017, 12:44 PM Pager: [phone]

## 2017-08-26 NOTE — Progress Notes (Signed)
[facility] - [place] [facility]   PATIENT NAME: Courtney Chan    MR#:  [id number]  DATE OF BIRTH:  02/22/1931  SUBJECTIVE:  CHIEF COMPLAINT:   Chief Complaint  Patient presents with  . Irregular Heart Beat  still coughing, HR in 120s at rest, husband at bedside REVIEW OF SYSTEMS:  Review of Systems  Constitutional: Negative for chills, fever and weight loss.  HENT: Negative for nosebleeds and sore throat.   Eyes: Negative for blurred vision.  Respiratory: Positive for cough. Negative for shortness of breath and wheezing.   Cardiovascular: Positive for palpitations. Negative for chest pain, orthopnea, leg swelling and PND.  Gastrointestinal: Negative for abdominal pain, constipation, diarrhea, heartburn, nausea and vomiting.  Genitourinary: Negative for dysuria and urgency.  Musculoskeletal: Negative for back pain.  Skin: Negative for rash.  Neurological: Negative for dizziness, speech change, focal weakness and headaches.  Endo/Heme/Allergies: Does not bruise/bleed easily.  Psychiatric/Behavioral: Negative for depression.    DRUG ALLERGIES:   Allergies  Allergen Reactions  . Oxycodone    VITALS:  Blood pressure 123/75, pulse 76, temperature 98.2 F (36.8 C), resp. rate 18, height 5\' 3"  (1.6 m), weight 67.4 kg (148 lb 8 oz), SpO2 97 %. PHYSICAL EXAMINATION:  Physical Exam  Constitutional: She is oriented to person, place, and time.  HENT:  Head: Normocephalic and atraumatic.  Eyes: Pupils are equal, round, and reactive to light. Conjunctivae and EOM are normal.  Neck: Normal range of motion. Neck supple. No tracheal deviation present. No thyromegaly present.  Cardiovascular: Normal rate, regular rhythm and normal heart sounds.  Pulmonary/Chest: Effort normal and breath sounds normal. No respiratory distress. She has no wheezes. She exhibits no tenderness.  Abdominal: Soft. Bowel sounds are normal. She exhibits no distension. There is no  tenderness.  Musculoskeletal: Normal range of motion.  Neurological: She is alert and oriented to person, place, and time. No cranial nerve deficit.  Skin: Skin is warm and dry. No rash noted.   LABORATORY PANEL:  Female CBC Recent Labs  Lab 08/25/17 1743  WBC 8.2  HGB 13.0  HCT 39.1  PLT 229   ------------------------------------------------------------------------------------------------------------------ Chemistries  Recent Labs  Lab 08/25/17 1938 08/26/17 0434  NA 137 138  K 4.0 4.0  CL 109 108  CO2 22 25  GLUCOSE 90 83  BUN 20 19  CREATININE 0.89 0.92  CALCIUM 8.3* 8.3*  AST 19  --   ALT 18  --   ALKPHOS 51  --   BILITOT 0.6  --    RADIOLOGY:  No results found. ASSESSMENT AND PLAN:   *Atrial fibrillation with rapid ventricular rate - continue metoprolol to 75 mg twice daily.  Continue Pradaxa. Added digoxin for better rate control per cardio IV as needed Lopressor  *Elevated troponin likely due to demand ischemia due to atrial fibrillation.   - Patient is anticoagulated with Pradaxa. Appreciate cardiology input Patient had normal stress echocardiogram earlier this year.  *Hypertension.  Continue metoprolol  *Hemoptysis.  Very minimal.  Likely due to coughing and being on Pradaxa.  Monitor.       All the records are reviewed and case discussed with Care Management/Social Worker. Management plans discussed with the patient, family (husband at bedside) and they are in agreement.  CODE STATUS: DNR  TOTAL TIME TAKING CARE OF THIS PATIENT: 35 minutes.   More than 50% of the time was spent in counseling/coordination of care: YES  POSSIBLE D/C IN 1-2 DAYS, DEPENDING ON CLINICAL  CONDITION.   Delfino Lovett M.D on 08/26/2017 at 7:59 PM  Between 7am to 6pm - Pager - [phone]  After 6pm go to [email or web address] - Social research officer, government  [facility]  [facility]ospital[facility]  Office  [phone]  CC: Primary care physician; Marisue Ivan, MD  Note:  This dictation was prepared with Dragon dictation along with smaller phrase technology. Any transcriptional errors that result from this process are unintentional.

## 2017-08-26 NOTE — Plan of Care (Signed)
Patient was admitted at 2300 on 4/14. No complaints of pain. Heart rate continues to fluctuate. Spouse is at the bedside. Patient profile completed.

## 2017-08-27 LAB — CBC
HEMATOCRIT: 36.3 % (ref 35.0–47.0)
Hemoglobin: 12.1 g/dL (ref 12.0–16.0)
MCH: 30.9 pg (ref 26.0–34.0)
MCHC: 33.3 g/dL (ref 32.0–36.0)
MCV: 92.8 fL (ref 80.0–100.0)
PLATELETS: 192 10*3/uL (ref 150–440)
RBC: 3.91 MIL/uL (ref 3.80–5.20)
RDW: 14.5 % (ref 11.5–14.5)
WBC: 5.8 10*3/uL (ref 3.6–11.0)

## 2017-08-27 LAB — BASIC METABOLIC PANEL
Anion gap: 5 (ref 5–15)
BUN: 27 mg/dL — ABNORMAL HIGH (ref 6–20)
CALCIUM: 8.7 mg/dL — AB (ref 8.9–10.3)
CO2: 22 mmol/L (ref 22–32)
CREATININE: 1.17 mg/dL — AB (ref 0.44–1.00)
Chloride: 108 mmol/L (ref 101–111)
GFR, EST AFRICAN AMERICAN: 48 mL/min — AB (ref >60)
GFR, EST NON AFRICAN AMERICAN: 42 mL/min — AB (ref >60)
Glucose, Bld: 123 mg/dL — ABNORMAL HIGH (ref 65–99)
Potassium: 4 mmol/L (ref 3.5–5.1)
Sodium: 135 mmol/L (ref 135–145)

## 2017-08-27 MED ORDER — DIGOXIN 62.5 MCG PO TABS: 0.0625 mg | ORAL_TABLET | Freq: Every day | ORAL | 0 refills | Status: DC

## 2017-08-27 MED ORDER — DIGOXIN 125 MCG PO TABS: 0.0625 mg | ORAL_TABLET | Freq: Every day | ORAL | Status: DC

## 2017-08-27 MED ORDER — GUAIFENESIN-DM 100-10 MG/5ML PO SYRP: 5.0000 mL | ORAL_SOLUTION | ORAL | 0 refills | Status: DC | PRN

## 2017-08-27 MED ORDER — DABIGATRAN ETEXILATE MESYLATE 75 MG PO CAPS
75.0000 mg | ORAL_CAPSULE | Freq: Two times a day (BID) | ORAL | Status: DC
  Filled 2017-08-27: qty 1

## 2017-08-27 MED ORDER — DIGOXIN 125 MCG PO TABS: 0.1250 mg | ORAL_TABLET | Freq: Every day | ORAL | 0 refills | Status: DC

## 2017-08-27 MED ORDER — DOXYCYCLINE HYCLATE 100 MG PO TABS: 100.0000 mg | ORAL_TABLET | Freq: Two times a day (BID) | ORAL | 0 refills | Status: DC

## 2017-08-27 MED ORDER — DABIGATRAN ETEXILATE MESYLATE 75 MG PO CAPS: 75.0000 mg | ORAL_CAPSULE | Freq: Two times a day (BID) | ORAL | 0 refills | Status: DC

## 2017-08-27 NOTE — Progress Notes (Signed)
IV taken out, tele monitor taken off.

## 2017-08-27 NOTE — Progress Notes (Signed)
Discharge instructions given to patient with husband at bedside. Patient and husband verbalized understanding and got all questions answered.Patient going home via family vehicle. Patient transported down via wheelchair with vital signs stable.

## 2017-08-27 NOTE — Plan of Care (Signed)
  Problem: Education: Goal: Knowledge of General Education information will improve Outcome: Adequate for Discharge   Problem: Health Behavior/Discharge Planning: Goal: Ability to manage health-related needs will improve Outcome: Adequate for Discharge   Problem: Clinical Measurements: Goal: Ability to maintain clinical measurements within normal limits will improve Outcome: Adequate for Discharge Goal: Will remain free from infection Outcome: Adequate for Discharge Goal: Diagnostic test results will improve Outcome: Adequate for Discharge Goal: Respiratory complications will improve Outcome: Adequate for Discharge Goal: Cardiovascular complication will be avoided Outcome: Adequate for Discharge   Problem: Activity: Goal: Risk for activity intolerance will decrease Outcome: Adequate for Discharge   Problem: Skin Integrity: Goal: Risk for impaired skin integrity will decrease Outcome: Adequate for Discharge   Problem: Education: Goal: Knowledge of disease or condition will improve Outcome: Adequate for Discharge Goal: Understanding of medication regimen will improve Outcome: Adequate for Discharge   Problem: Cardiac: Goal: Ability to achieve and maintain adequate cardiopulmonary perfusion will improve Outcome: Adequate for Discharge

## 2017-08-27 NOTE — Care Management Note (Signed)
Case Management Note  Patient Details  Name: Courtney MoatCamille J Araiza MRN: [id number] Date of Birth: 04/09/1931   Patient discharging today.  Lives at home with husband.  PCP [place].  No medical equipment in the home.  Patient agreeable to home health services.  Would only like RN services at home.  Patient states she does not have a preference of home health agency.  [facility]  RNCM signing off.   Subjective/Objective:                    Action/Plan:   Expected Discharge Date:  08/27/17               Expected Discharge Plan:  Home w Home Health Services  In-House Referral:     Discharge planning Services  CM Consult  Post Acute Care Choice:  Home Health Choice offered to:  Patient  DME Arranged:    DME Agency:     HH Arranged:  RN, PT, Respirator Therapy HH Agency:  [facility]  Status of Service:  Completed, signed off  If discussed at Long Length of Stay Meetings, dates discussed:    Additional Comments:  Chapman FitchBOWEN, Trevonn Hallum T, RN 08/27/2017, 4:03 PM

## 2017-08-27 NOTE — Discharge Instructions (Signed)
Digoxin tablets or capsules What is this medicine? DIGOXIN (di JOX in) is used to treat congestive heart failure and heart rhythm problems. This medicine may be used for other purposes; ask your health care provider or pharmacist if you have questions. COMMON BRAND NAME(S): Digitek, Lanoxicaps, Lanoxin What should I tell my health care provider before I take this medicine? They need to know if you have any of these conditions: -certain heart rhythm disorders -heart disease or recent heart attack -kidney or liver disease -an unusual or allergic reaction to digoxin, other medicines, foods, dyes, or preservatives -pregnant or trying to get pregnant -breast-feeding How should I use this medicine? Take this medicine by mouth with a glass of water. Follow the directions on the prescription label. Take your doses at regular intervals. Do not take your medicine more often than directed. Talk to your pediatrician regarding the use of this medicine in children. Special care may be needed. Overdosage: If you think you have taken too much of this medicine contact a poison control center or emergency room at once. NOTE: This medicine is only for you. Do not share this medicine with others. What if I miss a dose? If you miss a dose, take it as soon as you can. If it is almost time for your next dose, take only that dose. Do not take double or extra doses. What may interact with this medicine? -activated charcoal -albuterol -alprazolam -antacids -antiviral medicines for HIV or AIDS like ritonavir and saquinavir -calcium -certain antibiotics like azithromycin, clarithromycin, erythromycin, gentamicin, neomycin, trimethoprim, and tetracycline -certain medicines for blood pressure, heart disease, irregular heart beat -certain medicines for cancer -certain medicines for cholesterol like atorvastatin, cholestyramine, and colestipol -certain medicines for diabetes, like acarbose, exenatide, miglitol, and  metformin -certain medicines for fungal infections like ketoconazole and itraconazole -certain medicines for stomach problems like omeprazole, esomeprazole, lansoprazole, rabeprazole, metoclopramide, and sucralfate -conivaptan -cyclosporine -diphenoxylate -epinephrine -kaolin; pectin -nefazodone -NSAIDS, medicines for pain and inflammation, like celecoxib, ibuprofen, or naproxen -penicillamine -phenytoin -propantheline -quinine -phenytoin -rifampin -succinylcholine -St. John's Wort -sulfasalazine -teriparatide -thyroid hormones -tolvaptan This list may not describe all possible interactions. Give your health care provider a list of all the medicines, herbs, non-prescription drugs, or dietary supplements you use. Also tell them if you smoke, drink alcohol, or use illegal drugs. Some items may interact with your medicine. What should I watch for while using this medicine? Visit your doctor or health care professional for regular checks on your progress. Do not stop taking this medicine without the advice of your doctor or health care professional, even if you feel better. Do not change the brand you are taking, other brands may affect you differently. Check your heart rate and blood pressure regularly while you are taking this medicine. Ask your doctor or health care professional what your heart rate and blood pressure should be, and when you should contact him or her. Your doctor or health care professional also may schedule regular blood tests and electrocardiograms to check your progress. Watch your diet. Less digoxin may be absorbed from the stomach if you have a diet high in bran fiber. Do not treat yourself for coughs, colds or allergies without asking your doctor or health care professional for advice. Some ingredients can increase possible side effects. What side effects may I notice from receiving this medicine? Side effects that you should report to your doctor or health care  professional as soon as possible: -allergic reactions like skin rash, itching or hives, swelling of  the face, lips, or tongue -changes in behavior, mood, or mental ability -changes in vision -confusion -fast, irregular heartbeat -feeling faint or lightheaded, falls -headache -nausea, vomiting -unusual bleeding, bruising -unusually weak or tired Side effects that usually do not require medical attention (report to your doctor or health care professional if they continue or are bothersome): -breast enlargement in men and women -diarrhea This list may not describe all possible side effects. Call your doctor for medical advice about side effects. You may report side effects to FDA at 1-800-FDA-1088. Where should I keep my medicine? Keep out of the reach of children. Store at room temperature between 15 and 30 degrees C (59 and 86 degrees F). Protect from light and moisture. Throw away any unused medicine after the expiration date. NOTE: This sheet is a summary. It may not cover all possible information. If you have questions about this medicine, talk to your doctor, pharmacist, or health care provider.  2018 Elsevier/Gold Standard (2016-04-18 15:40:26)  Information on my medicine - Pradaxa (dabigatran)  This medication education was reviewed with me or my healthcare representative as part of my discharge preparation.  The pharmacist that spoke with me during my hospital stay was:  Carolynne EdouardHannah C Narcissus Detwiler, [facility]PH  Why was Pradaxa prescribed for you? Pradaxa was prescribed for you to reduce the risk of forming blood clots that cause a stroke if you have a medical condition called atrial fibrillation (a type of irregular heartbeat).    What do you Need to know about PradAXa? Take your Pradaxa TWICE DAILY - one capsule in the morning and one tablet in the evening with or without food.  It would be best to take the doses about the same time each day.  The capsules should not be broken, chewed or  opened - they must be swallowed whole.  Do not store Pradaxa in other medication containers - once the bottle is opened the Pradaxa should be used within FOUR months; throw away any capsules that havent been by that time.  Take Pradaxa exactly as prescribed by your doctor.  DO NOT stop taking Pradaxa without talking to the doctor who prescribed the medication.  Stopping without other stroke prevention medication to take the place of Pradaxa may increase your risk of developing a clot that causes a stroke.  Refill your prescription before you run out.  After discharge, you should have regular check-up appointments with your healthcare provider that is prescribing your Pradaxa.  In the future your dose may need to be changed if your kidney function or weight changes by a significant amount.  What do you do if you miss a dose? If you miss a dose, take it as soon as you remember on the same day.  If your next dose is less than 6 hours away, skip the missed dose.  Do not take two doses of PRADAXA at the same time.  Important Safety Information A possible side effect of Pradaxa is bleeding. You should call your healthcare provider right away if you experience any of the following: ? Bleeding from an injury or your nose that does not stop. ? Unusual colored urine (red or dark brown) or unusual colored stools (red or black). ? Unusual bruising for unknown reasons. ? A serious fall or if you hit your head (even if there is no bleeding).  Some medicines may interact with Pradaxa and might increase your risk of bleeding or clotting while on Pradaxa. To help avoid this, consult your healthcare provider or  pharmacist prior to using any new prescription or non-prescription medications, including herbals, vitamins, non-steroidal anti-inflammatory drugs (NSAIDs) and supplements. ° °This website has more information on Pradaxa® (dabigatran): [email or web address].com ° ° ° °

## 2017-09-01 NOTE — Discharge Summary (Signed)
[facility] [place] [facility]   PATIENT NAME: Courtney Chan    MR#:  [id number]  DATE OF BIRTH:  1930/12/29  DATE OF ADMISSION:  08/25/2017   ADMITTING PHYSICIAN: Milagros Loll, MD  DATE OF DISCHARGE: 08/27/2017  4:34 PM  PRIMARY CARE PHYSICIAN: Marisue Ivan, MD   ADMISSION DIAGNOSIS:  Elevated troponin [R74.8] Atrial fibrillation with RVR (HCC) [I48.91] DISCHARGE DIAGNOSIS:  Active Problems:   A-fib (HCC)   Atrial fibrillation (HCC)  SECONDARY DIAGNOSIS:   Past Medical History:  Diagnosis Date  . Atrial fibrillation (HCC) 2011  . Hyperlipidemia   . Hypertension   . Stroke (HCC)   . Thyroid disease    HOSPITAL COURSE:  84 y.o. female with history of chronic atrial fibrillation treated with metoprolol for rate control and Pradaxa for anticoagulation, history of hypertension, hyperlipidemia, history of coronary artery disease status post PCI with a bare metal stent in the left circumflex and mid RCA in 2010 admitted for sensation of a rapid heart rate.  She denied chest pain but felt her heart racing.  She has been evaluated with a stress echo in June 28, 2017 where she went 6 minutes on a Bruce protocol with no ischemic changes.  She has been compliant with her medications.  Her initial serum troponin was mildly elevated at 0.29.  Subsequent troponins have not increased.  They have remained steady.  She remains in atrial fibrillation with fairly rapid ventricular response.  *Atrial fibrillation with rapid ventricular rate - continue metoprolol to 75 mg twice daily. Continue Pradaxa. Added digoxin for better rate control per cardio And that seem to help  *Elevated troponin likely due to demand ischemia due to atrial fibrillation.  -Patient is anticoagulated with Pradaxa. Appreciate cardiology input Patient had normal stress echocardiogram earlier this year.  *Hypertension. Continue metoprolol  *Hemoptysis. Very minimal. Likely due  to coughing and being on Pradaxa.  DISCHARGE CONDITIONS:  stable CONSULTS OBTAINED:  Treatment Team:  Dalia Heading, MD DRUG ALLERGIES:   Allergies  Allergen Reactions  . Oxycodone    DISCHARGE MEDICATIONS:   Allergies as of 08/27/2017      Reactions   Oxycodone       Medication List    STOP taking these medications   cefdinir 300 MG capsule Commonly known as:  OMNICEF     TAKE these medications   Calcium Carbonate-Vitamin D 600-400 MG-UNIT tablet Take 1 tablet by mouth 2 (two) times daily.   Digoxin 62.5 MCG Tabs Take 0.0625 mg by mouth daily.   doxycycline 100 MG tablet Commonly known as:  VIBRA-TABS Take 1 tablet (100 mg total) by mouth every 12 (twelve) hours.   guaiFENesin-dextromethorphan 100-10 MG/5ML syrup Commonly known as:  ROBITUSSIN DM Take 5 mLs by mouth every 4 (four) hours as needed for cough.   HYDROcodone-acetaminophen 5-325 MG tablet Commonly known as:  NORCO/VICODIN Take 1 tablet by mouth every 4 (four) hours as needed for severe pain.   levothyroxine 88 MCG tablet Commonly known as:  SYNTHROID, LEVOTHROID Take 1 tablet by mouth every morning.   lovastatin 40 MG tablet Commonly known as:  MEVACOR Take 1 tablet by mouth at bedtime.   metoprolol succinate 100 MG 24 hr tablet Commonly known as:  TOPROL-XL Take 1 tablet by mouth every morning.   nitroGLYCERIN 0.4 MG SL tablet Commonly known as:  NITROSTAT Place 1 tablet (0.4 mg total) under the tongue every 5 (five) minutes as needed for chest pain.   PRADAXA  150 MG Caps capsule Generic drug:  dabigatran Take 1 tablet by mouth 2 (two) times daily. What changed:  Another medication with the same name was added. Make sure you understand how and when to take each.   dabigatran 75 MG Caps capsule Commonly known as:  PRADAXA Take 1 capsule (75 mg total) by mouth 2 (two) times daily. What changed:  You were already taking a medication with the same name, and this prescription was added.  Make sure you understand how and when to take each.   PRILOSEC 20 MG capsule Generic drug:  omeprazole Take 1 tablet by mouth daily.        DISCHARGE INSTRUCTIONS:   DIET:  Cardiac diet DISCHARGE CONDITION:  Stable ACTIVITY:  Activity as tolerated OXYGEN:  Home Oxygen: No.  Oxygen Delivery: room air DISCHARGE LOCATION:  home with home health  If you experience worsening of your admission symptoms, develop shortness of breath, life threatening emergency, suicidal or homicidal thoughts you must seek medical attention immediately by calling 911 or calling your MD immediately  if symptoms less severe.  You Must read complete instructions/literature along with all the possible adverse reactions/side effects for all the Medicines you take and that have been prescribed to you. Take any new Medicines after you have completely understood and accpet all the possible adverse reactions/side effects.   Please note  You were cared for by a hospitalist during your hospital stay. If you have any questions about your discharge medications or the care you received while you were in the hospital after you are discharged, you can call the unit and asked to speak with the hospitalist on call if the hospitalist that took care of you is not available. Once you are discharged, your primary care physician will handle any further medical issues. Please note that NO REFILLS for any discharge medications will be authorized once you are discharged, as it is imperative that you return to your primary care physician (or establish a relationship with a primary care physician if you do not have one) for your aftercare needs so that they can reassess your need for medications and monitor your lab values.    On the day of Discharge:  VITAL SIGNS:  Blood pressure 115/73, pulse 81, temperature 98.7 F (37.1 C), temperature source Oral, resp. rate 18, height 5\' 3"  (1.6 m), weight 67.3 kg (148 lb 4.8 oz), SpO2 98  %. PHYSICAL EXAMINATION:  GENERAL:  84 y.o.-year-old patient lying in the bed with no acute distress.  EYES: Pupils equal, round, reactive to light and accommodation. No scleral icterus. Extraocular muscles intact.  HEENT: Head atraumatic, normocephalic. Oropharynx and nasopharynx clear.  NECK:  Supple, no jugular venous distention. No thyroid enlargement, no tenderness.  LUNGS: Normal breath sounds bilaterally, no wheezing, rales,rhonchi or crepitation. No use of accessory muscles of respiration.  CARDIOVASCULAR: S1, S2 normal. No murmurs, rubs, or gallops.  ABDOMEN: Soft, non-tender, non-distended. Bowel sounds present. No organomegaly or mass.  EXTREMITIES: No pedal edema, cyanosis, or clubbing.  NEUROLOGIC: Cranial nerves II through XII are intact. Muscle strength 5/5 in all extremities. Sensation intact. Gait not checked.  PSYCHIATRIC: The patient is alert and oriented x 3.  SKIN: No obvious rash, lesion, or ulcer.  DATA REVIEW:   CBC Recent Labs  Lab 08/27/17 0430  WBC 5.8  HGB 12.1  HCT 36.3  PLT 192    Chemistries  Recent Labs  Lab 08/25/17 1938  08/27/17 0430  NA 137   < >  135  K 4.0   < > 4.0  CL 109   < > 108  CO2 22   < > 22  GLUCOSE 90   < > 123*  BUN 20   < > 27*  CREATININE 0.89   < > 1.17*  CALCIUM 8.3*   < > 8.7*  AST 19  --   --   ALT 18  --   --   ALKPHOS 51  --   --   BILITOT 0.6  --   --    < > = values in this interval not displayed.     Follow-up Information    Marisue Ivan, MD. Go on 09/05/2017.   Specialty:  Family Medicine Why:  Appointment Time: 1:30pm Contact information: [address] [facility] [place] [address] [phone]        Marcina Millard, MD. Nyra Capes on 09/12/2017.   Specialty:  Cardiology Why:  Appointment Time: 2:45pm Contact information: [address] [facility] [place] [address] [phone]           Management plans discussed with the patient,  family and they are in agreement.  CODE STATUS: Prior   TOTAL TIME TAKING CARE OF THIS PATIENT: 45 minutes.    Delfino Lovett M.D on 09/01/2017 at 11:07 AM  Between 7am to 6pm - Pager - [phone]  After 6pm go to [email or web address] - Social research officer, government  [facility]  Office  [phone]  CC: Primary care physician; Marisue Ivan, MD   Note: This dictation was prepared with Dragon dictation along with smaller phrase technology. Any transcriptional errors that result from this process are unintentional.

## 2017-09-02 ENCOUNTER — Telehealth: Payer: Self-pay

## 2017-09-02 NOTE — Telephone Encounter (Signed)
Patient returned call.  Other than still having a cough, she does not report having any questions or problems at this time.  She has an appointment with her PCP on Thursday.  Encouraged her to speak to them regarding the cough if it still continues to be an issue to see if there's something to help address it.   I thanked patient for her time.

## 2017-09-02 NOTE — Telephone Encounter (Signed)
Flagged on EMMI report for having other questions/problems.  1st attempt to reach patient made 09/02/17 at 12:20pm, though unavailable.  Left voicemail encouraging callback.  Will attempt at a later time.

## 2018-05-27 DIAGNOSIS — N183 Chronic kidney disease, stage 3 unspecified: Secondary | ICD-10-CM | POA: Insufficient documentation

## 2018-05-27 DIAGNOSIS — N1831 Chronic kidney disease, stage 3a: Secondary | ICD-10-CM | POA: Insufficient documentation

## 2018-05-27 DIAGNOSIS — Z8673 Personal history of transient ischemic attack (TIA), and cerebral infarction without residual deficits: Secondary | ICD-10-CM | POA: Insufficient documentation

## 2018-12-18 ENCOUNTER — Other Ambulatory Visit: Payer: Self-pay | Admitting: Nurse Practitioner

## 2018-12-18 ENCOUNTER — Ambulatory Visit
Admission: RE | Admit: 2018-12-18 | Discharge: 2018-12-18 | Disposition: A | Discharge status: Home / Self Care | Payer: Medicare Other | Source: Ambulatory Visit | Attending: Nurse Practitioner | Admitting: Nurse Practitioner

## 2018-12-18 ENCOUNTER — Other Ambulatory Visit: Payer: Self-pay

## 2018-12-18 DIAGNOSIS — R1032 Left lower quadrant pain: Secondary | ICD-10-CM | POA: Insufficient documentation

## 2018-12-18 MED ORDER — IOHEXOL 300 MG/ML  SOLN
100.0000 mL | Freq: Once | INTRAMUSCULAR | Status: AC | PRN
  Administered 2018-12-18: 100 mL via INTRAVENOUS

## 2018-12-19 ENCOUNTER — Other Ambulatory Visit
Admission: RE | Admit: 2018-12-19 | Discharge: 2018-12-19 | Disposition: A | Discharge status: Home / Self Care | Payer: Medicare Other | Source: Ambulatory Visit | Attending: Nurse Practitioner | Admitting: Nurse Practitioner

## 2018-12-19 DIAGNOSIS — R197 Diarrhea, unspecified: Secondary | ICD-10-CM | POA: Insufficient documentation

## 2018-12-19 LAB — GASTROINTESTINAL PANEL BY PCR, STOOL (REPLACES STOOL CULTURE)

## 2018-12-19 LAB — C DIFFICILE QUICK SCREEN W PCR REFLEX
C Diff interpretation: NOT DETECTED
C Diff toxin: NEGATIVE

## 2018-12-19 LAB — C DIFFICILE QUICK SCREEN W PCR REFLEX??: C Diff antigen: NEGATIVE

## 2018-12-31 DIAGNOSIS — R197 Diarrhea, unspecified: Secondary | ICD-10-CM | POA: Insufficient documentation

## 2018-12-31 DIAGNOSIS — N3000 Acute cystitis without hematuria: Secondary | ICD-10-CM | POA: Insufficient documentation

## 2019-01-05 ENCOUNTER — Other Ambulatory Visit: Payer: Self-pay

## 2019-01-05 ENCOUNTER — Encounter (INDEPENDENT_AMBULATORY_CARE_PROVIDER_SITE_OTHER): Payer: Self-pay | Admitting: Vascular Surgery

## 2019-01-05 ENCOUNTER — Ambulatory Visit (INDEPENDENT_AMBULATORY_CARE_PROVIDER_SITE_OTHER): Payer: Medicare Other | Admitting: Vascular Surgery

## 2019-01-05 VITALS — BP 112/69 | HR 62 | Resp 14 | Ht 63.5 in | Wt 146.0 lb

## 2019-01-05 DIAGNOSIS — E7800 Pure hypercholesterolemia, unspecified: Secondary | ICD-10-CM

## 2019-01-05 DIAGNOSIS — I482 Chronic atrial fibrillation, unspecified: Secondary | ICD-10-CM

## 2019-01-05 DIAGNOSIS — I10 Essential (primary) hypertension: Secondary | ICD-10-CM | POA: Diagnosis not present

## 2019-01-05 DIAGNOSIS — R7303 Prediabetes: Secondary | ICD-10-CM

## 2019-01-05 DIAGNOSIS — I739 Peripheral vascular disease, unspecified: Secondary | ICD-10-CM

## 2019-01-11 ENCOUNTER — Encounter (INDEPENDENT_AMBULATORY_CARE_PROVIDER_SITE_OTHER): Payer: Self-pay | Admitting: Vascular Surgery

## 2019-01-11 DIAGNOSIS — I739 Peripheral vascular disease, unspecified: Secondary | ICD-10-CM | POA: Insufficient documentation

## 2019-01-11 NOTE — Progress Notes (Signed)
MRN : [id number]  Courtney Chan is a 85 y.o. (28-Jul-1930) female who presents with chief complaint of  Chief Complaint  Patient presents with   New Patient (Initial Visit)  .  History of Present Illness:    The patient is seen for evaluation of painful lower extremities and diminished pulses. Patient notes the pain is always associated with activity and is very consistent day today. Typically, the pain occurs at less than one block, progress is as activity continues to the point that the patient must stop walking. Resting including standing still for several minutes allowed resumption of the activity and the ability to walk a similar distance before stopping again. Uneven terrain and inclined shorten the distance. The pain has been progressive over the past several years. The patient states the inability to walk is now having a profound negative impact on quality of life and daily activities.  The patient denies rest pain or dangling of an extremity off the side of the bed during the night for relief. No open wounds or sores at this time.  I am also asked to evaluate the patient for the complaint of abdominal pain with uncertain etiology.  The patient denies weight loss as well as nausea.  The patient does not substantiate food fear.  The patient denies bloody bowel movements or diarrhea. No history of peptic ulcer disease.   No prior peripheral angiograms or vascular interventions.  No history of back problems or DJD of the lumbar sacral spine.   The patient denies changes in claudication symptoms or new rest pain symptoms.  No new ulcers or wounds of the foot.  The patient's blood pressure has been stable and relatively well controlled. The patient denies amaurosis fugax or recent TIA symptoms. There are no recent neurological changes noted. The patient denies history of DVT, PE or superficial thrombophlebitis. The patient denies recent episodes of angina or shortness of breath.    Current Meds  Medication Sig   dabigatran (PRADAXA) 75 MG CAPS capsule Take 1 capsule (75 mg total) by mouth 2 (two) times daily.   levothyroxine (SYNTHROID, LEVOTHROID) 88 MCG tablet Take 1 tablet by mouth every morning.   losartan (COZAAR) 25 MG tablet    lovastatin (MEVACOR) 40 MG tablet Take 1 tablet by mouth at bedtime.   metoprolol succinate (TOPROL-XL) 100 MG 24 hr tablet Take 1 tablet by mouth every morning.    Past Medical History:  Diagnosis Date   Atrial fibrillation (HCC) 2011   Hyperlipidemia    Hypertension    Stroke [facility])    Thyroid disease     Past Surgical History:  Procedure Laterality Date   ABDOMINAL HYSTERECTOMY     BREAST BIOPSY Left 1980   Negative    Social History Social History   Tobacco Use   Smoking status: Never Smoker   Smokeless tobacco: Never Used  Substance Use Topics   Alcohol use: No   Drug use: No    Family History Family History  Problem Relation Age of Onset   Breast cancer Maternal Grandmother        60's  No family history of bleeding/clotting disorders, porphyria or autoimmune disease   Allergies  Allergen Reactions   Metronidazole Diarrhea and Nausea And Vomiting   Oxycodone Other (See Comments)    Altered mental status     REVIEW OF SYSTEMS (Negative unless checked)  Constitutional: [] Weight loss  [] Fever  [] Chills Cardiac: [] Chest pain   [] Chest pressure   [] Palpitations   []   Shortness of breath when laying flat   [] Shortness of breath with exertion. Vascular:  [] Pain in legs with walking   [] Pain in legs at rest  [] History of DVT   [] Phlebitis   [] Swelling in legs   [] Varicose veins   [] Non-healing ulcers Pulmonary:   [] Uses home oxygen   [] Productive cough   [] Hemoptysis   [] Wheeze  [] COPD   [] Asthma Neurologic:  [] Dizziness   [] Seizures   [] History of stroke   [] History of TIA  [] Aphasia   [] Vissual changes   [] Weakness or numbness in arm   [] Weakness or numbness in leg Musculoskeletal:    [] Joint swelling   [] Joint pain   [] Low back pain Hematologic:  [] Easy bruising  [] Easy bleeding   [] Hypercoagulable state   [] Anemic Gastrointestinal:  [] Diarrhea   [] Vomiting  [] Gastroesophageal reflux/heartburn   [] Difficulty swallowing. Genitourinary:  [] Chronic kidney disease   [] Difficult urination  [] Frequent urination   [] Blood in urine Skin:  [] Rashes   [] Ulcers  Psychological:  [] History of anxiety   []  History of major depression.  Physical Examination  Vitals:   01/05/19 1523  BP: 112/69  Pulse: 62  Resp: 14  Weight: 146 lb (66.2 kg)  Height: 5' 3.5" (1.613 m)   Body mass index is 25.46 kg/m. Gen: WD/WN, NAD Head: [place]/AT, No temporalis wasting.  Ear/Nose/Throat: Hearing grossly intact, nares w/o erythema or drainage, poor dentition Eyes: PER, EOMI, sclera nonicteric.  Neck: Supple, no masses.  No bruit or JVD.  Pulmonary:  Good air movement, clear to auscultation bilaterally, no use of accessory muscles.  Cardiac: RRR, normal S1, S2, no Murmurs. Vascular:  Vessel Right Left  Radial Palpable Palpable  PT 1+ Palpable 1+ Palpable  DP 2+ Palpable 2+ Palpable   Gastrointestinal: soft, non-distended. No guarding/no peritoneal signs.  Musculoskeletal: M/S 5/5 throughout.  No deformity or atrophy.  Neurologic: CN 2-12 intact. Pain and light touch intact in extremities.  Symmetrical.  Speech is fluent. Motor exam as listed above. Psychiatric: Judgment intact, Mood & affect appropriate for pt's clinical situation. Dermatologic: No rashes or ulcers noted.  No changes consistent with cellulitis. Lymph : No Cervical lymphadenopathy, no lichenification or skin changes of chronic lymphedema.  CBC Lab Results  Component Value Date   WBC 5.8 08/27/2017   HGB 12.1 08/27/2017   HCT 36.3 08/27/2017   MCV 92.8 08/27/2017   PLT 192 08/27/2017    BMET    Component Value Date/Time   NA 135 08/27/2017 0430   NA 145 08/08/2011 0320   K 4.0 08/27/2017 0430   K 3.7 08/08/2011  0320   CL 108 08/27/2017 0430   CL 106 08/08/2011 0320   CO2 22 08/27/2017 0430   CO2 28 08/08/2011 0320   GLUCOSE 123 (H) 08/27/2017 0430   GLUCOSE 86 08/08/2011 0320   BUN 27 (H) 08/27/2017 0430   BUN 17 08/08/2011 0320   CREATININE 1.17 (H) 08/27/2017 0430   CREATININE 0.97 08/08/2011 0320   CALCIUM 8.7 (L) 08/27/2017 0430   CALCIUM 9.1 08/08/2011 0320   GFRNONAA 42 (L) 08/27/2017 0430   GFRNONAA 59 (L) 08/08/2011 0320   GFRAA 48 (L) 08/27/2017 0430   GFRAA >60 08/08/2011 0320   CrCl cannot be calculated (Patient's most recent lab result is older than the maximum 21 days allowed.).  COAG Lab Results  Component Value Date   INR 0.9 08/07/2011    Radiology Ct Abdomen Pelvis W Contrast  Result Date: 12/18/2018 CLINICAL DATA:  Left lower quadrant abdominal  pain for 3 weeks with some diarrhea. History of diverticulitis. EXAM: CT ABDOMEN AND PELVIS WITH CONTRAST TECHNIQUE: Multidetector CT imaging of the abdomen and pelvis was performed using the standard protocol following bolus administration of intravenous contrast. CONTRAST:  100mL OMNIPAQUE IOHEXOL 300 MG/ML  SOLN COMPARISON:  None. FINDINGS: Lower chest: Thick parenchymal band in the lingula is compatible with postinfectious/postinflammatory scarring. Peripheral right lower lobe 3 mm solid pulmonary nodule (series 4/image 12). Coronary atherosclerosis. Hepatobiliary: Normal liver size. No liver mass. Normal gallbladder with no radiopaque cholelithiasis. No biliary ductal dilatation. Pancreas: Normal, with no mass or duct dilation. Spleen: Normal size. No mass. Adrenals/Urinary Tract: Normal adrenals. Few scattered tiny hypodense subcentimeter renal cortical lesions in both kidneys are too small to characterize and require no follow-up. No hydronephrosis. Normal bladder. Stomach/Bowel: Small hiatal hernia. Otherwise normal nondistended stomach. Normal caliber small bowel with no small bowel wall thickening. Appendix not discretely  visualized. Oral contrast transits to the right colon. Moderate left colonic diverticulosis, most prominent in the sigmoid colon, with no large bowel wall thickening or significant pericolonic fat stranding. Vascular/Lymphatic: Atherosclerotic nonaneurysmal abdominal aorta. Patent portal, splenic, hepatic and renal veins. No pathologically enlarged lymph nodes in the abdomen or pelvis. Reproductive: Status post hysterectomy, with no abnormal findings at the vaginal cuff. No adnexal mass. Other: No pneumoperitoneum, ascites or focal fluid collection. There is evidence pelvic floor laxity with suggestion a rectocele. Musculoskeletal: No aggressive appearing focal osseous lesions. Marked lumbar spondylosis. IMPRESSION: 1. No acute abnormality. No evidence of bowel obstruction or acute bowel inflammation. Moderate left colonic diverticulosis, with no evidence of acute diverticulitis. 2. Evidence of pelvic floor laxity with suggestion of a rectocele. 3. Small hiatal hernia. 4. Right lung base 3 mm solid pulmonary nodule. No follow-up needed if patient is low-risk. Non-contrast chest CT can be considered in 12 months if patient is high-risk. This recommendation follows the consensus statement: Guidelines for Management of Incidental Pulmonary Nodules Detected on CT Images:From the Fleischner Society 2017; published online before print (10.1148/[id number]). 5.  Aortic Atherosclerosis (ICD10-I70.0). These results will be called to the ordering clinician or representative by the Radiology Department at the imaging location. Electronically Signed   By: Delbert PhenixJason A Poff M.D.   On: 12/18/2018 15:38     Assessment/Plan 1. PAD (peripheral artery disease) (HCC)  Recommend:  The patient has evidence of atherosclerosis of the lower extremities with claudication.  The patient does not voice lifestyle limiting changes at this point in time.  Noninvasive studies do not suggest clinically significant change.  No invasive  studies, angiography or surgery at this time The patient should continue walking and begin a more formal exercise program.  The patient should continue antiplatelet therapy and aggressive treatment of the lipid abnormalities  No changes in the patient's medications at this time  The patient should continue wearing graduated compression socks 10-15 mmHg strength to control the mild edema.    2. Chronic atrial fibrillation Continue antiarrhythmia medications as already ordered, these medications have been reviewed and there are no changes at this time.  Continue anticoagulation as ordered by Cardiology Service   3. Essential hypertension Continue antihypertensive medications as already ordered, these medications have been reviewed and there are no changes at this time.   4. Pure hypercholesterolemia Continue statin as ordered and reviewed, no changes at this time   5. Borderline diabetes mellitus Continue hypoglycemic medications as already ordered, these medications have been reviewed and there are no changes at this time.  Hgb A1C to  be monitored as already arranged by primary service    Levora DredgeGregory Livingston Denner, MD  01/11/2019 5:07 PM

## 2019-01-21 ENCOUNTER — Ambulatory Visit: Payer: Medicare Other | Admitting: Urology

## 2019-01-21 ENCOUNTER — Other Ambulatory Visit: Payer: Self-pay

## 2019-01-21 ENCOUNTER — Encounter: Payer: Self-pay | Admitting: Urology

## 2019-01-21 VITALS — BP 113/66 | HR 72 | Ht 63.0 in | Wt 149.0 lb

## 2019-01-21 DIAGNOSIS — N390 Urinary tract infection, site not specified: Secondary | ICD-10-CM | POA: Diagnosis not present

## 2019-01-21 LAB — BLADDER SCAN AMB NON-IMAGING

## 2019-01-21 NOTE — Progress Notes (Signed)
01/21/2019 8:52 AM   Donnal Moat [birth year] [id number]  Referring provider: Marisue Ivan, MD [phone] [facility] [address] [facility] [place],  [place] [address]  Chief Complaint  Patient presents with  . Recurrent UTI    New Patient    HPI:  Courtney Chan was referred over for recurrent urinary tract infection.  She was complaining of abdominal pain and diarrhea.  Past cultures were reviewed and noted to have E. coli and Streptococcus. One urinalysis had 10-50 red blood cells back in 2019.   She was referred to urology. She thought abx do help her symptoms. She has no dysuria. No gross hematuria. She voids with a good flow. No frequency. She has urgency. She wears a pad at night. Sometimes leaks when she sleeps. Trying to drink more water. No constipation.   She underwent CT scan of the abdomen and pelvis on 12/18/2018 which was benign.  I reviewed all the images. She had evidence of a rectocele.   NG risk includes CVA. Bladder scan 106 ml. UA clear. S/p Hx.   Modifying factors: There are no other modifying factors  Associated signs and symptoms: There are no other associated signs and symptoms Aggravating and relieving factors: There are no other aggravating or relieving factors Severity: Moderate Duration: Persistent  PMH: Past Medical History:  Diagnosis Date  . Atrial fibrillation (HCC) 2011  . Hyperlipidemia   . Hypertension   . Stroke (HCC)   . Thyroid disease     Surgical History: Past Surgical History:  Procedure Laterality Date  . ABDOMINAL HYSTERECTOMY    . BREAST BIOPSY Left 1980   Negative    Home Medications:  Allergies as of 01/21/2019      Reactions   Metronidazole Diarrhea, Nausea And Vomiting   Oxycodone Other (See Comments)   Altered mental status      Medication List       Accurate as of January 21, 2019  8:52 AM. If you have any questions, ask your nurse or doctor.        STOP taking these medications   doxycycline 100 MG  tablet Commonly known as: VIBRA-TABS Stopped by: Jerilee Field, MD   HYDROcodone-acetaminophen 5-325 MG tablet Commonly known as: NORCO/VICODIN Stopped by: Jerilee Field, MD     TAKE these medications   budesonide 3 MG 24 hr capsule Commonly known as: ENTOCORT EC TAKE 3 CAPS DAILY AS DIRECTED FOR 3 WEEKS THEN 2 CAPS DAILY FOR 3 WEEKS THEN 1 CAP DAILY X3 WEEKS   Calcium Carbonate-Vitamin D 600-400 MG-UNIT tablet Take 1 tablet by mouth 2 (two) times daily.   dabigatran 75 MG Caps capsule Commonly known as: PRADAXA Take 1 capsule (75 mg total) by mouth 2 (two) times daily.   Digoxin 62.5 MCG Tabs Take 0.0625 mg by mouth daily.   guaiFENesin-dextromethorphan 100-10 MG/5ML syrup Commonly known as: ROBITUSSIN DM Take 5 mLs by mouth every 4 (four) hours as needed for cough.   levothyroxine 88 MCG tablet Commonly known as: SYNTHROID Take 1 tablet by mouth every morning.   losartan 25 MG tablet Commonly known as: COZAAR   lovastatin 40 MG tablet Commonly known as: MEVACOR Take 1 tablet by mouth at bedtime.   metoprolol succinate 100 MG 24 hr tablet Commonly known as: TOPROL-XL Take 1 tablet by mouth every morning.   nitroGLYCERIN 0.4 MG SL tablet Commonly known as: Nitrostat Place 1 tablet (0.4 mg total) under the tongue every 5 (five) minutes as needed for chest pain.  PriLOSEC 20 MG capsule Generic drug: omeprazole Take 1 tablet by mouth daily.       Allergies:  Allergies  Allergen Reactions  . Metronidazole Diarrhea and Nausea And Vomiting  . Oxycodone Other (See Comments)    Altered mental status    Family History: Family History  Problem Relation Age of Onset  . Breast cancer Maternal Grandmother        55's    Social History:  reports that she has never smoked. She has never used smokeless tobacco. She reports that she does not drink alcohol or use drugs.  ROS: UROLOGY Frequent Urination?: No Hard to postpone urination?: No Burning/pain  with urination?: No Get up at night to urinate?: No Leakage of urine?: No Urine stream starts and stops?: No Trouble starting stream?: No Do you have to strain to urinate?: No Blood in urine?: No Urinary tract infection?: Yes Sexually transmitted disease?: No Injury to kidneys or bladder?: No Painful intercourse?: No Weak stream?: No Currently pregnant?: No Vaginal bleeding?: No Last menstrual period?: n  Gastrointestinal Nausea?: No Vomiting?: No Indigestion/heartburn?: No Diarrhea?: No Constipation?: No  Constitutional Fever: No Night sweats?: No Weight loss?: No Fatigue?: No  Skin Skin rash/lesions?: No Itching?: No  Eyes Blurred vision?: No Double vision?: No  Ears/Nose/Throat Sore throat?: No Sinus problems?: No  Hematologic/Lymphatic Swollen glands?: No Easy bruising?: No  Cardiovascular Leg swelling?: No Chest pain?: No  Respiratory Cough?: No Shortness of breath?: No  Endocrine Excessive thirst?: No  Musculoskeletal Back pain?: No Joint pain?: No  Neurological Headaches?: No Dizziness?: No  Psychologic Depression?: No Anxiety?: No  Physical Exam: BP 113/66   Pulse 72   Ht 5\' 3"  (1.6 m)   Wt 67.6 kg   BMI 26.39 kg/m   Constitutional:  Alert and oriented, No acute distress. HEENT:  AT, moist mucus membranes.  Trachea midline, no masses. Cardiovascular: No clubbing, cyanosis, or edema. Respiratory: Normal respiratory effort, no increased work of breathing. GI: Abdomen is soft, nontender, nondistended, no abdominal masses GU: No CVA tenderness Skin: No rashes, bruises or suspicious lesions. Neurologic: Grossly intact, no focal deficits, moving all 4 extremities. Psychiatric: Normal mood and affect.  Laboratory Data: Lab Results  Component Value Date   WBC 5.8 08/27/2017   HGB 12.1 08/27/2017   HCT 36.3 08/27/2017   MCV 92.8 08/27/2017   PLT 192 08/27/2017    Lab Results  Component Value Date   CREATININE 1.17 (H)  08/27/2017    No results found for: PSA  No results found for: TESTOSTERONE  No results found for: HGBA1C  Urinalysis    Component Value Date/Time   COLORURINE Colorless 08/05/2011 1910   APPEARANCEUR Clear 08/05/2011 1910   LABSPEC 1.005 08/05/2011 1910   PHURINE 7.0 08/05/2011 1910   GLUCOSEU Negative 08/05/2011 1910   HGBUR Negative 08/05/2011 1910   BILIRUBINUR Negative 08/05/2011 1910   KETONESUR Negative 08/05/2011 1910   PROTEINUR Negative 08/05/2011 1910   NITRITE Negative 08/05/2011 1910   LEUKOCYTESUR Negative 08/05/2011 1910    Lab Results  Component Value Date   BACTERIA NONE SEEN 08/05/2011    Pertinent Imaging: CT  No results found for this or any previous visit. No results found for this or any previous visit. No results found for this or any previous visit. No results found for this or any previous visit. No results found for this or any previous visit. No results found for this or any previous visit. No results found for this or any  previous visit. No results found for this or any previous visit.  Assessment & Plan:    1. Recurrent UTI No worrisome symptoms or findings on PVR or imaging. In the future, we discussed bacteria in the bladder apart from symptoms is normal and she doesn't need abx. Bacteria can be protective of future symptomatic UTI.  - Urinalysis, Complete - BLADDER SCAN AMB NON-IMAGING  2. MH - she will return for exam and cystoscopy.   No follow-ups on file.  Jerilee FieldMatthew Kallum Jorgensen, MD  [facility] [address], [address] [place], [place] [address] [phone]

## 2019-01-21 NOTE — Patient Instructions (Signed)
Urinary Tract Infection, Adult A urinary tract infection (UTI) is an infection of any part of the urinary tract. The urinary tract includes:  The kidneys.  The ureters.  The bladder.  The urethra. These organs make, store, and get rid of pee (urine) in the body. What are the causes? This is caused by germs (bacteria) in your genital area. These germs grow and cause swelling (inflammation) of your urinary tract. What increases the risk? You are more likely to develop this condition if:  You have a small, thin tube (catheter) to drain pee.  You cannot control when you pee or poop (incontinence).  You are female, and: ? You use these methods to prevent pregnancy: ? A medicine that kills sperm (spermicide). ? A device that blocks sperm (diaphragm). ? You have low levels of a female hormone (estrogen). ? You are pregnant.  You have genes that add to your risk.  You are sexually active.  You take antibiotic medicines.  You have trouble peeing because of: ? A prostate that is bigger than normal, if you are female. ? A blockage in the part of your body that drains pee from the bladder (urethra). ? A kidney stone. ? A nerve condition that affects your bladder (neurogenic bladder). ? Not getting enough to drink. ? Not peeing often enough.  You have other conditions, such as: ? Diabetes. ? A weak disease-fighting system (immune system). ? Sickle cell disease. ? Gout. ? Injury of the spine. What are the signs or symptoms? Symptoms of this condition include:  Needing to pee right away (urgently).  Peeing often.  Peeing small amounts often.  Pain or burning when peeing.  Blood in the pee.  Pee that smells bad or not like normal.  Trouble peeing.  Pee that is cloudy.  Fluid coming from the vagina, if you are female.  Pain in the belly or lower back. Other symptoms include:  Throwing up (vomiting).  No urge to eat.  Feeling mixed up (confused).  Being tired  and grouchy (irritable).  A fever.  Watery poop (diarrhea). How is this treated? This condition may be treated with:  Antibiotic medicine.  Other medicines.  Drinking enough water. Follow these instructions at home:  Medicines  Take over-the-counter and prescription medicines only as told by your doctor.  If you were prescribed an antibiotic medicine, take it as told by your doctor. Do not stop taking it even if you start to feel better. General instructions  Make sure you: ? Pee until your bladder is empty. ? Do not hold pee for a long time. ? Empty your bladder after sex. ? Wipe from front to back after pooping if you are a female. Use each tissue one time when you wipe.  Drink enough fluid to keep your pee pale yellow.  Keep all follow-up visits as told by your doctor. This is important. Contact a doctor if:  You do not get better after 1-2 days.  Your symptoms go away and then come back. Get help right away if:  You have very bad back pain.  You have very bad pain in your lower belly.  You have a fever.  You are sick to your stomach (nauseous).  You are throwing up. Summary  A urinary tract infection (UTI) is an infection of any part of the urinary tract.  This condition is caused by germs in your genital area.  There are many risk factors for a UTI. These include having a small, thin   tube to drain pee and not being able to control when you pee or poop.  Treatment includes antibiotic medicines for germs.  Drink enough fluid to keep your pee pale yellow. This information is not intended to replace advice given to you by your health care provider. Make sure you discuss any questions you have with your health care provider. Document Released: 10/17/2007 Document Revised: 04/17/2018 Document Reviewed: 11/07/2017 Elsevier Patient Education  2020 Elsevier Inc.  

## 2019-01-22 LAB — URINALYSIS, COMPLETE
Bilirubin, UA: NEGATIVE
Glucose, UA: NEGATIVE
Ketones, UA: NEGATIVE
Nitrite, UA: NEGATIVE
Protein,UA: NEGATIVE
Specific Gravity, UA: 1.02 (ref 1.005–1.030)
Urobilinogen, Ur: 0.2 mg/dL (ref 0.2–1.0)
pH, UA: 6 (ref 5.0–7.5)

## 2019-01-22 LAB — MICROSCOPIC EXAMINATION: Bacteria, UA: NONE SEEN

## 2019-02-18 ENCOUNTER — Other Ambulatory Visit: Payer: Self-pay

## 2019-02-18 ENCOUNTER — Ambulatory Visit (INDEPENDENT_AMBULATORY_CARE_PROVIDER_SITE_OTHER): Payer: Medicare Other | Admitting: Urology

## 2019-02-18 ENCOUNTER — Encounter: Payer: Self-pay | Admitting: Urology

## 2019-02-18 VITALS — BP 124/86 | HR 83 | Ht 63.0 in | Wt 150.0 lb

## 2019-02-18 DIAGNOSIS — R3129 Other microscopic hematuria: Secondary | ICD-10-CM | POA: Diagnosis not present

## 2019-02-18 NOTE — Progress Notes (Signed)
   02/18/19  CC:  Chief Complaint  Patient presents with  . Cysto    HPI:  F/u - recurrent urinary tract infection.  She was complaining of abdominal pain and diarrhea.  Past cultures were reviewed and noted to have E. coli and Streptococcus. One urinalysis had 10-50 red blood cells back in 2019.   She was referred to urology. She thought abx do help her symptoms. She has no dysuria. No gross hematuria. She voids with a good flow. No frequency. She has urgency. She wears a pad at night. Sometimes leaks when she sleeps. Trying to drink more water. No constipation. We discussed several of these episodes sounds like incidental bacteruria was discovered.   She underwent CT scan of the abdomen and pelvis on 12/18/2018 which was benign.  I reviewed all the images. She had evidence of a rectocele.   NG risk includes CVA. Bladder scan 106 ml. UA clear. S/p Hx.   She returns for cystoscopy given the [place] on UA. Doing well. No dysuria. No gross hematuria. No bladder pain. She couldn't leave a specimen.   Height 5\' 3"  (1.6 m). NED. A&Ox3.   No respiratory distress   Abd soft, NT, ND Normal external genitalia with patent urethral meatus Good apical and anterior support but enlarged introitus with a Grade 2 rectocele.  Chaperone for exam and cystoscopy-Jessica and Judson Roch  Cystoscopy Procedure Note  Patient identification was confirmed, informed consent was obtained, and patient was prepped using Betadine solution.  Lidocaine jelly was administered per urethral meatus.    Procedure: - Flexible cystoscope introduced, without any difficulty.   - Thorough search of the bladder revealed:    normal urethral meatus    normal urothelium    no stones    no ulcers     no tumors    no urethral polyps    no trabeculation  - Ureteral orifices were normal in position and appearance.  Post-Procedure: - Patient tolerated the procedure well  Assessment/ Plan:  MH - benign eval   Bacteriuria - no tx  needed    No follow-ups on file.  Festus Aloe, MD

## 2019-02-18 NOTE — Patient Instructions (Signed)
Return back in one year

## 2019-03-30 DIAGNOSIS — I2510 Atherosclerotic heart disease of native coronary artery without angina pectoris: Secondary | ICD-10-CM | POA: Insufficient documentation

## 2019-06-28 ENCOUNTER — Encounter: Payer: Self-pay | Admitting: Emergency Medicine

## 2019-06-28 ENCOUNTER — Other Ambulatory Visit: Payer: Self-pay

## 2019-06-28 ENCOUNTER — Inpatient Hospital Stay
Admission: EM | Admit: 2019-06-28 | Discharge: 2019-06-28 | DRG: 246 | Disposition: A | Discharge status: Other Acute Inpatient Hospital | Payer: Medicare PPO | Attending: Cardiology | Admitting: Cardiology

## 2019-06-28 ENCOUNTER — Encounter (HOSPITAL_COMMUNITY): Payer: Self-pay | Admitting: Cardiology

## 2019-06-28 ENCOUNTER — Inpatient Hospital Stay (HOSPITAL_COMMUNITY): Payer: Medicare PPO

## 2019-06-28 ENCOUNTER — Emergency Department: Payer: Medicare PPO

## 2019-06-28 ENCOUNTER — Encounter
Admission: EM | Disposition: A | Discharge status: Other Acute Inpatient Hospital | Payer: Self-pay | Source: Home / Self Care | Attending: Cardiology

## 2019-06-28 ENCOUNTER — Inpatient Hospital Stay (HOSPITAL_COMMUNITY)
Admission: AD | Admit: 2019-06-28 | Discharge: 2019-07-04 | DRG: 280 | Disposition: A | Discharge status: Rehabilitation Facility | Payer: Medicare PPO | Source: Other Acute Inpatient Hospital | Attending: Cardiology | Admitting: Cardiology

## 2019-06-28 DIAGNOSIS — Z885 Allergy status to narcotic agent status: Secondary | ICD-10-CM

## 2019-06-28 DIAGNOSIS — E7800 Pure hypercholesterolemia, unspecified: Secondary | ICD-10-CM | POA: Diagnosis present

## 2019-06-28 DIAGNOSIS — I341 Nonrheumatic mitral (valve) prolapse: Secondary | ICD-10-CM | POA: Diagnosis present

## 2019-06-28 DIAGNOSIS — I4901 Ventricular fibrillation: Secondary | ICD-10-CM | POA: Diagnosis present

## 2019-06-28 DIAGNOSIS — R042 Hemoptysis: Secondary | ICD-10-CM | POA: Diagnosis not present

## 2019-06-28 DIAGNOSIS — I739 Peripheral vascular disease, unspecified: Secondary | ICD-10-CM | POA: Diagnosis present

## 2019-06-28 DIAGNOSIS — J189 Pneumonia, unspecified organism: Secondary | ICD-10-CM | POA: Diagnosis present

## 2019-06-28 DIAGNOSIS — I4819 Other persistent atrial fibrillation: Secondary | ICD-10-CM | POA: Diagnosis present

## 2019-06-28 DIAGNOSIS — I2129 ST elevation (STEMI) myocardial infarction involving other sites: Secondary | ICD-10-CM | POA: Diagnosis not present

## 2019-06-28 DIAGNOSIS — I130 Hypertensive heart and chronic kidney disease with heart failure and stage 1 through stage 4 chronic kidney disease, or unspecified chronic kidney disease: Secondary | ICD-10-CM | POA: Diagnosis present

## 2019-06-28 DIAGNOSIS — Z9911 Dependence on respirator [ventilator] status: Secondary | ICD-10-CM

## 2019-06-28 DIAGNOSIS — I639 Cerebral infarction, unspecified: Secondary | ICD-10-CM | POA: Diagnosis not present

## 2019-06-28 DIAGNOSIS — I214 Non-ST elevation (NSTEMI) myocardial infarction: Secondary | ICD-10-CM | POA: Diagnosis present

## 2019-06-28 DIAGNOSIS — E039 Hypothyroidism, unspecified: Secondary | ICD-10-CM | POA: Diagnosis present

## 2019-06-28 DIAGNOSIS — S2249XA Multiple fractures of ribs, unspecified side, initial encounter for closed fracture: Secondary | ICD-10-CM | POA: Diagnosis present

## 2019-06-28 DIAGNOSIS — I462 Cardiac arrest due to underlying cardiac condition: Secondary | ICD-10-CM | POA: Diagnosis present

## 2019-06-28 DIAGNOSIS — I482 Chronic atrial fibrillation, unspecified: Secondary | ICD-10-CM

## 2019-06-28 DIAGNOSIS — I10 Essential (primary) hypertension: Secondary | ICD-10-CM | POA: Diagnosis present

## 2019-06-28 DIAGNOSIS — Z20822 Contact with and (suspected) exposure to covid-19: Secondary | ICD-10-CM | POA: Diagnosis present

## 2019-06-28 DIAGNOSIS — Z90710 Acquired absence of both cervix and uterus: Secondary | ICD-10-CM

## 2019-06-28 DIAGNOSIS — Z955 Presence of coronary angioplasty implant and graft: Secondary | ICD-10-CM

## 2019-06-28 DIAGNOSIS — I63512 Cerebral infarction due to unspecified occlusion or stenosis of left middle cerebral artery: Secondary | ICD-10-CM | POA: Diagnosis not present

## 2019-06-28 DIAGNOSIS — Z8673 Personal history of transient ischemic attack (TIA), and cerebral infarction without residual deficits: Secondary | ICD-10-CM | POA: Diagnosis not present

## 2019-06-28 DIAGNOSIS — N183 Chronic kidney disease, stage 3 unspecified: Secondary | ICD-10-CM | POA: Diagnosis present

## 2019-06-28 DIAGNOSIS — K219 Gastro-esophageal reflux disease without esophagitis: Secondary | ICD-10-CM | POA: Diagnosis present

## 2019-06-28 DIAGNOSIS — I2510 Atherosclerotic heart disease of native coronary artery without angina pectoris: Secondary | ICD-10-CM | POA: Diagnosis present

## 2019-06-28 DIAGNOSIS — I959 Hypotension, unspecified: Secondary | ICD-10-CM | POA: Diagnosis present

## 2019-06-28 DIAGNOSIS — R079 Chest pain, unspecified: Secondary | ICD-10-CM | POA: Diagnosis present

## 2019-06-28 DIAGNOSIS — Z7901 Long term (current) use of anticoagulants: Secondary | ICD-10-CM

## 2019-06-28 DIAGNOSIS — I5041 Acute combined systolic (congestive) and diastolic (congestive) heart failure: Secondary | ICD-10-CM | POA: Diagnosis present

## 2019-06-28 DIAGNOSIS — I63412 Cerebral infarction due to embolism of left middle cerebral artery: Secondary | ICD-10-CM | POA: Diagnosis present

## 2019-06-28 DIAGNOSIS — I088 Other rheumatic multiple valve diseases: Secondary | ICD-10-CM | POA: Diagnosis present

## 2019-06-28 DIAGNOSIS — R0682 Tachypnea, not elsewhere classified: Secondary | ICD-10-CM

## 2019-06-28 DIAGNOSIS — Z803 Family history of malignant neoplasm of breast: Secondary | ICD-10-CM | POA: Diagnosis not present

## 2019-06-28 DIAGNOSIS — I340 Nonrheumatic mitral (valve) insufficiency: Secondary | ICD-10-CM | POA: Diagnosis not present

## 2019-06-28 DIAGNOSIS — E876 Hypokalemia: Secondary | ICD-10-CM | POA: Diagnosis present

## 2019-06-28 DIAGNOSIS — I5031 Acute diastolic (congestive) heart failure: Secondary | ICD-10-CM | POA: Diagnosis not present

## 2019-06-28 DIAGNOSIS — Z888 Allergy status to other drugs, medicaments and biological substances status: Secondary | ICD-10-CM

## 2019-06-28 DIAGNOSIS — Y848 Other medical procedures as the cause of abnormal reaction of the patient, or of later complication, without mention of misadventure at the time of the procedure: Secondary | ICD-10-CM | POA: Diagnosis present

## 2019-06-28 DIAGNOSIS — Z0189 Encounter for other specified special examinations: Secondary | ICD-10-CM

## 2019-06-28 DIAGNOSIS — I4891 Unspecified atrial fibrillation: Secondary | ICD-10-CM | POA: Diagnosis present

## 2019-06-28 DIAGNOSIS — I361 Nonrheumatic tricuspid (valve) insufficiency: Secondary | ICD-10-CM | POA: Diagnosis not present

## 2019-06-28 DIAGNOSIS — I4510 Unspecified right bundle-branch block: Secondary | ICD-10-CM | POA: Diagnosis present

## 2019-06-28 DIAGNOSIS — N179 Acute kidney failure, unspecified: Secondary | ICD-10-CM | POA: Diagnosis present

## 2019-06-28 DIAGNOSIS — Z66 Do not resuscitate: Secondary | ICD-10-CM | POA: Diagnosis present

## 2019-06-28 DIAGNOSIS — I469 Cardiac arrest, cause unspecified: Secondary | ICD-10-CM | POA: Diagnosis present

## 2019-06-28 DIAGNOSIS — I69398 Other sequelae of cerebral infarction: Secondary | ICD-10-CM | POA: Diagnosis not present

## 2019-06-28 DIAGNOSIS — Z79890 Hormone replacement therapy (postmenopausal): Secondary | ICD-10-CM

## 2019-06-28 DIAGNOSIS — J9601 Acute respiratory failure with hypoxia: Secondary | ICD-10-CM | POA: Diagnosis present

## 2019-06-28 DIAGNOSIS — E785 Hyperlipidemia, unspecified: Secondary | ICD-10-CM | POA: Diagnosis present

## 2019-06-28 DIAGNOSIS — Z79899 Other long term (current) drug therapy: Secondary | ICD-10-CM

## 2019-06-28 DIAGNOSIS — M8580 Other specified disorders of bone density and structure, unspecified site: Secondary | ICD-10-CM | POA: Diagnosis present

## 2019-06-28 DIAGNOSIS — Z9981 Dependence on supplemental oxygen: Secondary | ICD-10-CM | POA: Diagnosis not present

## 2019-06-28 DIAGNOSIS — Z7983 Long term (current) use of bisphosphonates: Secondary | ICD-10-CM

## 2019-06-28 DIAGNOSIS — I509 Heart failure, unspecified: Secondary | ICD-10-CM

## 2019-06-28 DIAGNOSIS — R5381 Other malaise: Secondary | ICD-10-CM | POA: Diagnosis not present

## 2019-06-28 DIAGNOSIS — Z881 Allergy status to other antibiotic agents status: Secondary | ICD-10-CM

## 2019-06-28 DIAGNOSIS — I5043 Acute on chronic combined systolic (congestive) and diastolic (congestive) heart failure: Secondary | ICD-10-CM | POA: Diagnosis present

## 2019-06-28 DIAGNOSIS — I5181 Takotsubo syndrome: Secondary | ICD-10-CM | POA: Diagnosis present

## 2019-06-28 DIAGNOSIS — N1832 Chronic kidney disease, stage 3b: Secondary | ICD-10-CM | POA: Diagnosis present

## 2019-06-28 DIAGNOSIS — Z7902 Long term (current) use of antithrombotics/antiplatelets: Secondary | ICD-10-CM

## 2019-06-28 DIAGNOSIS — I5030 Unspecified diastolic (congestive) heart failure: Secondary | ICD-10-CM | POA: Diagnosis not present

## 2019-06-28 HISTORY — PX: CORONARY/GRAFT ACUTE MI REVASCULARIZATION: CATH118305

## 2019-06-28 HISTORY — PX: LEFT HEART CATH AND CORONARY ANGIOGRAPHY: CATH118249

## 2019-06-28 LAB — DIGOXIN LEVEL: Digoxin Level: 0.3 ng/mL — ABNORMAL LOW (ref 0.8–2.0)

## 2019-06-28 LAB — CBC
HCT: 40.3 % (ref 36.0–46.0)
Hemoglobin: 12.7 g/dL (ref 12.0–15.0)
MCH: 29.9 pg (ref 26.0–34.0)
MCHC: 31.5 g/dL (ref 30.0–36.0)
MCV: 94.8 fL (ref 80.0–100.0)
Platelets: 386 10*3/uL (ref 150–400)
RBC: 4.25 MIL/uL (ref 3.87–5.11)
RDW: 13.8 % (ref 11.5–15.5)
WBC: 8 10*3/uL (ref 4.0–10.5)
nRBC: 0 % (ref 0.0–0.2)

## 2019-06-28 LAB — PROTIME-INR
INR: 2.3 — ABNORMAL HIGH (ref 0.8–1.2)
Prothrombin Time: 25.2 seconds — ABNORMAL HIGH (ref 11.4–15.2)

## 2019-06-28 LAB — POCT ACTIVATED CLOTTING TIME
Activated Clotting Time: 246 seconds
Activated Clotting Time: 323 seconds

## 2019-06-28 LAB — BASIC METABOLIC PANEL
Anion gap: 9 (ref 5–15)
BUN: 19 mg/dL (ref 8–23)
CO2: 24 mmol/L (ref 22–32)
Calcium: 9.2 mg/dL (ref 8.9–10.3)
Chloride: 108 mmol/L (ref 98–111)
Creatinine, Ser: 1.05 mg/dL — ABNORMAL HIGH (ref 0.44–1.00)
GFR calc Af Amer: 56 mL/min — ABNORMAL LOW (ref >60)
GFR calc non Af Amer: 48 mL/min — ABNORMAL LOW (ref >60)
Glucose, Bld: 91 mg/dL (ref 70–99)
Potassium: 4.4 mmol/L (ref 3.5–5.1)
Sodium: 141 mmol/L (ref 135–145)

## 2019-06-28 LAB — POCT I-STAT 7, (LYTES, BLD GAS, ICA,H+H)
Acid-base deficit: 3 mmol/L — ABNORMAL HIGH (ref 0.0–2.0)
Bicarbonate: 21.4 mmol/L (ref 20.0–28.0)
Calcium, Ion: 1.18 mmol/L (ref 1.15–1.40)
HCT: 33 % — ABNORMAL LOW (ref 36.0–46.0)
Hemoglobin: 11.2 g/dL — ABNORMAL LOW (ref 12.0–15.0)
O2 Saturation: 97 %
Patient temperature: 97.7
Potassium: 4.9 mmol/L (ref 3.5–5.1)
Sodium: 140 mmol/L (ref 135–145)
TCO2: 22 mmol/L (ref 22–32)
pCO2 arterial: 35.3 mmHg (ref 32.0–48.0)
pH, Arterial: 7.388 (ref 7.350–7.450)
pO2, Arterial: 88 mmHg (ref 83.0–108.0)

## 2019-06-28 LAB — MRSA PCR SCREENING: MRSA by PCR: NEGATIVE

## 2019-06-28 LAB — HEMOGLOBIN A1C
Hgb A1c MFr Bld: 6.1 % — ABNORMAL HIGH (ref 4.8–5.6)
Mean Plasma Glucose: 128.37 mg/dL

## 2019-06-28 LAB — RESPIRATORY PANEL BY RT PCR (FLU A&B, COVID)
Influenza A by PCR: NEGATIVE
Influenza B by PCR: NEGATIVE
SARS Coronavirus 2 by RT PCR: NEGATIVE

## 2019-06-28 LAB — TROPONIN I (HIGH SENSITIVITY)
Troponin I (High Sensitivity): 11576 ng/L (ref <18)
Troponin I (High Sensitivity): 13408 ng/L (ref <18)

## 2019-06-28 LAB — APTT: aPTT: 66 seconds — ABNORMAL HIGH (ref 24–36)

## 2019-06-28 SURGERY — CORONARY/GRAFT ACUTE MI REVASCULARIZATION
Anesthesia: Moderate Sedation

## 2019-06-28 MED ORDER — ASPIRIN 300 MG RE SUPP
600.0000 mg | RECTAL | Status: AC
  Administered 2019-06-28: 600 mg via RECTAL
  Filled 2019-06-28: qty 2

## 2019-06-28 MED ORDER — FENTANYL BOLUS VIA INFUSION
25.0000 ug | INTRAVENOUS | Status: DC | PRN
  Administered 2019-06-28 – 2019-06-29 (×3): 25 ug via INTRAVENOUS
  Filled 2019-06-28: qty 25

## 2019-06-28 MED ORDER — CLOPIDOGREL BISULFATE 300 MG PO TABS
ORAL_TABLET | ORAL | Status: AC
  Filled 2019-06-28: qty 2

## 2019-06-28 MED ORDER — MIDAZOLAM HCL 2 MG/2ML IJ SOLN
INTRAMUSCULAR | Status: DC | PRN
  Administered 2019-06-28: 2 mg via INTRAVENOUS
  Administered 2019-06-28: 1 mg via INTRAVENOUS

## 2019-06-28 MED ORDER — FENTANYL 2500MCG IN NS 250ML (10MCG/ML) PREMIX INFUSION: 0.0000 ug/h | INTRAVENOUS | Status: DC

## 2019-06-28 MED ORDER — HEPARIN BOLUS VIA INFUSION: 4000.0000 [IU] | Freq: Once | INTRAVENOUS | Status: DC

## 2019-06-28 MED ORDER — FENTANYL CITRATE (PF) 100 MCG/2ML IJ SOLN
25.0000 ug | Freq: Once | INTRAMUSCULAR | Status: DC
  Filled 2019-06-28: qty 2

## 2019-06-28 MED ORDER — MIDAZOLAM HCL 2 MG/2ML IJ SOLN
INTRAMUSCULAR | Status: AC
  Filled 2019-06-28: qty 2

## 2019-06-28 MED ORDER — FENTANYL 2500MCG IN NS 250ML (10MCG/ML) PREMIX INFUSION
25.0000 ug/h | INTRAVENOUS | Status: DC
  Administered 2019-06-28: 50 ug/h via INTRAVENOUS

## 2019-06-28 MED ORDER — EPINEPHRINE 1 MG/10ML IJ SOSY
PREFILLED_SYRINGE | INTRAMUSCULAR | Status: AC | PRN
  Administered 2019-06-28: 1 via INTRAVENOUS

## 2019-06-28 MED ORDER — HEPARIN SODIUM (PORCINE) 1000 UNIT/ML IJ SOLN
INTRAMUSCULAR | Status: AC
  Filled 2019-06-28: qty 1

## 2019-06-28 MED ORDER — ORAL CARE MOUTH RINSE
15.0000 mL | OROMUCOSAL | Status: DC
  Administered 2019-06-29 (×7): 15 mL via OROMUCOSAL

## 2019-06-28 MED ORDER — CHLORHEXIDINE GLUCONATE CLOTH 2 % EX PADS
6.0000 | MEDICATED_PAD | Freq: Every day | CUTANEOUS | Status: DC
  Administered 2019-06-28 – 2019-07-04 (×4): 6 via TOPICAL

## 2019-06-28 MED ORDER — ROCURONIUM BROMIDE 50 MG/5ML IV SOLN
INTRAVENOUS | Status: AC | PRN
  Administered 2019-06-28: 50 mg via INTRAVENOUS

## 2019-06-28 MED ORDER — TIROFIBAN HCL IN NACL 5-0.9 MG/100ML-% IV SOLN
INTRAVENOUS | Status: AC
  Filled 2019-06-28: qty 100

## 2019-06-28 MED ORDER — VERAPAMIL HCL 2.5 MG/ML IV SOLN
INTRAVENOUS | Status: DC | PRN
  Administered 2019-06-28 (×2): 2.5 mg via INTRA_ARTERIAL

## 2019-06-28 MED ORDER — SODIUM CHLORIDE 0.9% FLUSH
3.0000 mL | Freq: Once | INTRAVENOUS | Status: AC
  Administered 2019-06-28: 3 mL via INTRAVENOUS

## 2019-06-28 MED ORDER — PANTOPRAZOLE SODIUM 40 MG IV SOLR
40.0000 mg | INTRAVENOUS | Status: DC
  Administered 2019-06-28: 40 mg via INTRAVENOUS
  Filled 2019-06-28: qty 40

## 2019-06-28 MED ORDER — CHLORHEXIDINE GLUCONATE 0.12% ORAL RINSE (MEDLINE KIT)
15.0000 mL | Freq: Two times a day (BID) | OROMUCOSAL | Status: DC
  Administered 2019-06-28 – 2019-06-29 (×2): 15 mL via OROMUCOSAL

## 2019-06-28 MED ORDER — CLOPIDOGREL BISULFATE 75 MG PO TABS
ORAL_TABLET | ORAL | Status: DC | PRN
  Administered 2019-06-28: 600 mg

## 2019-06-28 MED ORDER — TIROFIBAN HCL IN NACL 5-0.9 MG/100ML-% IV SOLN
INTRAVENOUS | Status: AC | PRN
  Administered 2019-06-28: 0.075 ug/kg/min via INTRAVENOUS

## 2019-06-28 MED ORDER — ETOMIDATE 2 MG/ML IV SOLN
INTRAVENOUS | Status: AC | PRN
  Administered 2019-06-28: 10 mg via INTRAVENOUS

## 2019-06-28 MED ORDER — HEPARIN SODIUM (PORCINE) 1000 UNIT/ML IJ SOLN
INTRAMUSCULAR | Status: DC | PRN
  Administered 2019-06-28: 4000 [IU] via INTRAVENOUS
  Administered 2019-06-28: 2000 [IU] via INTRAVENOUS

## 2019-06-28 MED ORDER — MIDAZOLAM HCL 2 MG/2ML IJ SOLN
INTRAMUSCULAR | Status: DC | PRN
  Administered 2019-06-28: 1 mg via INTRAVENOUS

## 2019-06-28 MED ORDER — HEPARIN (PORCINE) 25000 UT/250ML-% IV SOLN: 800.0000 [IU]/h | INTRAVENOUS | Status: DC

## 2019-06-28 MED ORDER — VERAPAMIL HCL 2.5 MG/ML IV SOLN
INTRAVENOUS | Status: AC
  Filled 2019-06-28: qty 2

## 2019-06-28 MED ORDER — HEPARIN (PORCINE) 25000 UT/250ML-% IV SOLN
800.0000 [IU]/h | INTRAVENOUS | Status: DC
  Administered 2019-06-29: 800 [IU]/h via INTRAVENOUS
  Filled 2019-06-28: qty 250

## 2019-06-28 MED ORDER — SODIUM CHLORIDE 0.9 % IV BOLUS
500.0000 mL | Freq: Once | INTRAVENOUS | Status: AC
  Administered 2019-06-28: 500 mL via INTRAVENOUS

## 2019-06-28 MED ORDER — FENTANYL 2500MCG IN NS 250ML (10MCG/ML) PREMIX INFUSION
INTRAVENOUS | Status: AC
  Administered 2019-06-28: 100 ug/h via INTRAVENOUS
  Filled 2019-06-28: qty 250

## 2019-06-28 MED ORDER — SODIUM CHLORIDE 0.9 % IV SOLN
INTRAVENOUS | Status: AC | PRN
  Administered 2019-06-28: 1000 mL via INTRAVENOUS

## 2019-06-28 MED ORDER — IOHEXOL 300 MG/ML  SOLN
INTRAMUSCULAR | Status: DC | PRN
  Administered 2019-06-28: 70 mL

## 2019-06-28 MED ORDER — TIROFIBAN (AGGRASTAT) BOLUS VIA INFUSION
INTRAVENOUS | Status: DC | PRN
  Administered 2019-06-28: 1700 ug via INTRAVENOUS

## 2019-06-28 SURGICAL SUPPLY — 16 items
BALLN EUPHORA RX 2.0X12 (BALLOONS) ×2
BALLN ~~LOC~~ EUPHORA RX 2.25X12 (BALLOONS) ×2
BALLOON EUPHORA RX 2.0X12 (BALLOONS) ×1 IMPLANT
BALLOON ~~LOC~~ EUPHORA RX 2.25X12 (BALLOONS) ×1 IMPLANT
CATH 5F 110X4 TIG (CATHETERS) ×2 IMPLANT
CATH INFINITI 5FR ANG PIGTAIL (CATHETERS) ×2 IMPLANT
CATH LAUNCHER 5F EBU3.5 (CATHETERS) ×2 IMPLANT
DEVICE INFLAT 30 PLUS (MISCELLANEOUS) ×2 IMPLANT
DEVICE RAD TR BAND REGULAR (VASCULAR PRODUCTS) ×2 IMPLANT
GLIDESHEATH SLEND A-KIT 6F 22G (SHEATH) ×2 IMPLANT
KIT MANI 3VAL PERCEP (MISCELLANEOUS) ×2 IMPLANT
PACK CARDIAC CATH (CUSTOM PROCEDURE TRAY) ×2 IMPLANT
STENT RESOLUTE ONYX 2.0X15 (Permanent Stent) ×2 IMPLANT
WIRE ASAHI PROWATER 180CM (WIRE) ×2 IMPLANT
WIRE HITORQ VERSACORE ST 145CM (WIRE) ×2 IMPLANT
WIRE ROSEN-J .035X260CM (WIRE) ×2 IMPLANT

## 2019-06-28 NOTE — Progress Notes (Signed)
ANTICOAGULATION CONSULT NOTE  Consult  Pharmacy Consult for Heparin  Indication: Atrial fibrillation  Allergies  Allergen Reactions  . Metronidazole Diarrhea and Nausea And Vomiting  . Oxycodone Other (See Comments)    Altered mental status    Patient Measurements: Height: 5\' 4"  (162.6 cm) Weight: 157 lb 3 oz (71.3 kg) IBW/kg (Calculated) : 54.7 Heparin Dosing Weight: 68 kg   Vital Signs: Temp: 97.7 F (36.5 C) (02/14 1948) Temp Source: Axillary (02/14 1948) BP: 94/57 (02/14 2315) Pulse Rate: 80 (02/14 2315)  Labs: Recent Labs    06/28/19 1302 06/28/19 1522 06/28/19 2051  HGB 12.7  --  11.2*  HCT 40.3  --  33.0*  PLT 386  --   --   APTT  --  66*  --   LABPROT  --  25.2*  --   INR  --  2.3*  --   CREATININE 1.05*  --   --   TROPONINIHS 11,576* 13,408*  --     Estimated Creatinine Clearance: 37.2 mL/min (A) (by C-G formula based on SCr of 1.05 mg/dL (H)).  Assessment: 86 yo female with h/o Afib s/p LCx PCI 2/14, Pradaxa on hold while intubated/sedated, for heparin  Goal of Therapy:  Heparin level 0.3-0.7 units/ml Monitor platelets by anticoagulation protocol: Yes   Plan:  D/C heparin bolus given recent cardiac cath Start heparin 800 units/hr as ordered Check heparin level in 8 hours. F/U plan for resuming Pradaxa  3/14 06/28/2019,11:35 PM

## 2019-06-28 NOTE — Progress Notes (Signed)
Pt transported to cath lab on vent. 

## 2019-06-28 NOTE — Code Documentation (Addendum)
Pulse check. Pt pulseless. CPR continued. V fib on monitor.

## 2019-06-28 NOTE — Interval H&P Note (Signed)
History and Physical Interval Note:  06/28/2019 4:55 PM  Courtney Chan  has presented today for surgery, with the diagnosis of NON-ST ELEVATION MI WITH CARDIAC ARREST.  The various methods of treatment have been discussed with the patient and family. After consideration of risks, benefits and other options for treatment, the patient has consented to  Procedure(s): Coronary/Graft Acute MI Revascularization (N/A) LEFT HEART CATH AND CORONARY ANGIOGRAPHY (N/A)  PERCUTANEOUS CORONARY INTERVENTION  as a surgical intervention.  The patient's history has been reviewed, patient examined, no change in status, stable for surgery.  I have reviewed the patient's chart and labs.  Questions were answered to the patient's satisfaction.     Bryan Lemma

## 2019-06-28 NOTE — ED Triage Notes (Signed)
Pt to ER states she is having CP and SHOB. States chest pain started before bed last night and then when she was taking her normal morning walk this morning had to stop about half way due to [facility].  Pt states CP is midsternal and does not radiate.

## 2019-06-28 NOTE — Progress Notes (Signed)
eLink Physician-Brief Progress Note Patient Name: Courtney Chan DOB: 1930-12-06 MRN: [id number]   Date of Service  06/28/2019  HPI/Events of Note  70F who presented to [facility] earlier today with STEMI that led to VFib arrest s/p shock/ROSC, then taken emergently to Cath Lab for PCI to LCx, now transferred to CCU at [facility] for ongoing management and post-ROSC care.  Patient is currently intubated and synchronous with the ventilator (VC 430x15, 5, 40%), hemodynamically stable without need for vasopressors, and apparently comfortable on fentanyl drip at 75 mcg/hr. She was following some commands per bedside team who saw her slightly earlier on.   eICU Interventions  # Neuro: - Low dose fentanyl drip for sedation/analgesia. - Patient is following commands; no indication for TTM.  # Respiratory: - Continue mechanical ventilation on present settings. - SAT/SBT in AM as tolerated.  # Cardiac: - Defer management to CCU team. - Not requiring vasopressors at this time. - On heparin drip for Afib.  # Renal:  - CTM.  # GI: - NPO for now.  DVT PPX: Heparin drip (therapeutic a/c) GI PPX: Protonix IV qday      Intervention Category Evaluation Type: New Patient Evaluation  Janae Bridgeman 06/28/2019, 11:49 PM

## 2019-06-28 NOTE — Consult Note (Signed)
[facility] Cardiology Consultation Note  Patient ID: Courtney Chan, MRN: [id number], DOB/AGE: 1930-06-19 86 y.o. Admit date: 06/28/2019   Date of Consult: 06/28/2019 Primary Physician: Marisue Ivan, MD Primary Cardiologist: Paraschos  Chief Complaint:  Chief Complaint  Patient presents with  . Chest Pain  . Shortness of Breath   Reason for Consult: Acute ventricular fibrillation  HPI: 86 y.o. female with known coronary artery disease status post PCI and stent placement right coronary artery circumflex and the remote past with apparent permanent atrial fibrillation with previous stroke hypertension hyperlipidemia on appropriate medication management for further risk reduction of cardiovascular event.  The patient apparently his had some issues in the last few days for which she was seen in the emergency room.  While in the emergency room she had an episode of chest discomfort and collapsed.  At that time it was appearing that the patient underwent CPR and had ventricular fibrillation.  The patient did have appropriate ACLS protocol and was given atropine as well as epinephrine.  Atrial fibrillation had more rapid ventricular rate and she did get better blood pressure and circulation recovery.  At that time there were some nonspecific ST changes but there was concern that the patient had an acute myocardial infarction.  She has been stabilized with appropriate medication management at this time and it was felt that the patient after intubation would need emergent cardiac catheterization.  We have as a team discussed with the family the risks and benefits of cardiac catheterization and further intervention as well as medical management and all in agreement of further intervention at this time for treatment options.  Past Medical History:  Diagnosis Date  . Atrial fibrillation (HCC) 2011  . Hyperlipidemia   . Hypertension   . Stroke (HCC)   . Thyroid disease       Surgical History:   Past Surgical History:  Procedure Laterality Date  . ABDOMINAL HYSTERECTOMY    . BREAST BIOPSY Left 1980   Negative     Home Meds: Prior to Admission medications   Medication Sig Start Date End Date Taking? Authorizing Provider  alendronate (FOSAMAX) 70 MG tablet Take 70 mg by mouth once a week. 06/07/19  Yes [provider]  Calcium Carbonate-Vitamin D 600-400 MG-UNIT per tablet Take 1 tablet by mouth 2 (two) times daily.   Yes [provider]  dabigatran (PRADAXA) 75 MG CAPS capsule Take 1 capsule (75 mg total) by mouth 2 (two) times daily. 08/27/17  Yes Delfino Lovett, MD  digoxin 62.5 MCG TABS Take 0.0625 mg by mouth daily. 08/28/17  Yes Delfino Lovett, MD  levothyroxine (SYNTHROID, LEVOTHROID) 88 MCG tablet Take 88 mcg by mouth daily.    Yes [provider]  losartan (COZAAR) 25 MG tablet Take 25 mg by mouth daily.    Yes [provider]  lovastatin (MEVACOR) 40 MG tablet Take 40 mg by mouth at bedtime.    Yes [provider]  metoprolol succinate (TOPROL-XL) 100 MG 24 hr tablet Take 100 mg by mouth daily.    Yes [provider]  omeprazole (PRILOSEC) 20 MG capsule Take 20 mg by mouth daily.    Yes [provider]    Inpatient Medications:   . fentaNYL infusion INTRAVENOUS 100 mcg/hr (06/28/19 1446)    Allergies:  Allergies  Allergen Reactions  . Metronidazole Diarrhea and Nausea And Vomiting  . Oxycodone Other (See Comments)    Altered mental status    Social History   Socioeconomic  History  . Marital status: Married    Spouse name: Not on file  . Number of children: Not on file  . Years of education: Not on file  . Highest education level: Not on file  Occupational History  . Not on file  Tobacco Use  . Smoking status: Never Smoker  . Smokeless tobacco: Never Used  Substance and Sexual Activity  . Alcohol use: No  . Drug use: No  . Sexual activity: Not on file  Other Topics Concern  . Not on file   Social History Narrative  . Not on file   Social Determinants of Health   Financial Resource Strain:   . Difficulty of Paying Living Expenses: Not on file  Food Insecurity:   . Worried About Charity fundraiser in the Last Year: Not on file  . Ran Out of Food in the Last Year: Not on file  Transportation Needs:   . Lack of Transportation (Medical): Not on file  . Lack of Transportation (Non-Medical): Not on file  Physical Activity:   . Days of Exercise per Week: Not on file  . Minutes of Exercise per Session: Not on file  Stress:   . Feeling of Stress : Not on file  Social Connections:   . Frequency of Communication with Friends and Family: Not on file  . Frequency of Social Gatherings with Friends and Family: Not on file  . Attends Religious Services: Not on file  . Active Member of Clubs or Organizations: Not on file  . Attends Archivist Meetings: Not on file  . Marital Status: Not on file  Intimate Partner Violence:   . Fear of Current or Ex-Partner: Not on file  . Emotionally Abused: Not on file  . Physically Abused: Not on file  . Sexually Abused: Not on file     Family History  Problem Relation Age of Onset  . Breast cancer Maternal Grandmother        60's     Review of Systems Cannot assess  labs: No results for input(s): CKTOTAL, CKMB, TROPONINI in the last 72 hours. Lab Results  Component Value Date   WBC 8.0 06/28/2019   HGB 12.7 06/28/2019   HCT 40.3 06/28/2019   MCV 94.8 06/28/2019   PLT 386 06/28/2019    Recent Labs  Lab 06/28/19 1302  NA 141  K 4.4  CL 108  CO2 24  BUN 19  CREATININE 1.05*  CALCIUM 9.2  GLUCOSE 91   No results found for: CHOL, HDL, LDLCALC, TRIG No results found for: DDIMER  Radiology/Studies:  DG Chest 2 View  Result Date: 06/28/2019 CLINICAL DATA:  Chest pain shortness of breath EXAM: CHEST - 2 VIEW COMPARISON:  August 25, 2017 FINDINGS: The mediastinal contour and cardiac silhouette are normal. Mild  opacity of left lung base is identified at least in part due to atelectasis but superimposed early pneumonia is not excluded. There is no pulmonary edema or pleural effusion. The bony structures are stable. IMPRESSION: Mild opacity of left lung base at least in part due to atelectasis but superimposed early pneumonia is not excluded. Electronically Signed   By: Abelardo Diesel M.D.   On: 06/28/2019 14:41   CT Head Wo Contrast  Result Date: 06/28/2019 CLINICAL DATA:  Cardiac arrest, unresponsive and pulseless upon arrival to ED, intubated EXAM: CT HEAD WITHOUT CONTRAST TECHNIQUE: Contiguous axial images were obtained from the base of the skull through the vertex without intravenous contrast. COMPARISON:  06/28/2019 FINDINGS: Brain: Chronic small vessel ischemic changes are seen within the bilateral frontal periventricular white matter and left external capsule. No acute infarct or hemorrhage. Lateral ventricles and remaining midline structures are unremarkable. There are no acute extra-axial fluid collections. There is no mass effect. Vascular: No hyperdense vessel or unexpected calcification. Skull: Normal. Negative for fracture or focal lesion. Sinuses/Orbits: No acute finding. Other: None IMPRESSION: 1. Stable head CT, no acute intracranial process. Electronically Signed   By: Sharlet Salina M.D.   On: 06/28/2019 15:57   DG Chest Portable 1 View  Result Date: 06/28/2019 CLINICAL DATA:  Status post arrest, intubated EXAM: PORTABLE CHEST 1 VIEW COMPARISON:  06/28/2019 FINDINGS: Single frontal view of the chest demonstrates defibrillator pads overlying left chest. Endotracheal tube overlies tracheal air column, tip just below thoracic inlet. Enteric catheter passes below diaphragm, tip excluded by collimation. Numerous cardiac leads overlie the chest. Cardiac silhouette is stable. Since the prior exam, diffuse interstitial and ground-glass opacities have developed consistent with pulmonary edema. Small left  pleural effusion is noted. There is no pneumothorax on this supine exam. No acute displaced fractures. IMPRESSION: 1. Support devices as above.  No evidence of malpositioning. 2. Interval development of mild pulmonary edema. Electronically Signed   By: Sharlet Salina M.D.   On: 06/28/2019 15:13   DG Abd Portable 1 View  Result Date: 06/28/2019 CLINICAL DATA:  Intubated, enteric catheter placement, arrest EXAM: PORTABLE ABDOMEN - 1 VIEW COMPARISON:  None. FINDINGS: Frontal view of the lower chest and upper abdomen demonstrates enteric catheter tip and side port projecting over gastric body. Numerous cardiac leads are identified. Defibrillator pads overlie the left chest. Bowel gas pattern is unremarkable. Interstitial and ground-glass opacities and small left pleural effusion consistent with pulmonary edema. IMPRESSION: 1. Enteric catheter overlying gastric body. 2. Pulmonary edema. Electronically Signed   By: Sharlet Salina M.D.   On: 06/28/2019 15:14    EKG: Atrial fibrillation with bundle branch block and rapid ventricular rate  Weights: Physical Exam: Blood pressure 115/72, pulse (!) 117, temperature 98.6 F (37 C), resp. rate 19, height 5\' 4"  (1.626 m), weight 68 kg, SpO2 100 %. Body mass index is 25.75 kg/m. Further exam as per above.   Filed Weights   06/28/19 1255  Weight: 68 kg         Assessment: 86 year old female with known coronary artery disease hypertension hyperlipidemia previous peripheral vascular disease with a cerebrovascular accident with acute cardiac arrest possibly due to non-ST elevation myocardial infarction needing further treatment options  Plan: 1.  Urgent procession to cardiac catheterization to assess coronary anatomy further treatment thereof as necessary 2.  Continue following heart rate control of atrial fibrillation for which she is slightly improved after wearing off epinephrine 3.  Further medication management and treatment options after cardiac  intervention  Signed, 89 M.D. [facility] Cardiology 06/28/2019, 5:12 PM

## 2019-06-28 NOTE — Progress Notes (Signed)
RT obtained ABG on pt with the following results. No changes needed at this time. Pt respiratory status is stable on the vent at this time. RT will continue to monitor.  Results for AMERIS, AKAMINE (MRN [id number]) as of 06/28/2019 20:59  Ref. Range 06/28/2019 20:51  Sample type Unknown ARTERIAL  pH, Arterial Latest Ref Range: 7.350 - 7.450  7.388  pCO2 arterial Latest Ref Range: 32.0 - 48.0 mmHg 35.3  pO2, Arterial Latest Ref Range: 83.0 - 108.0 mmHg 88.0  TCO2 Latest Ref Range: 22 - 32 mmol/L 22  Acid-base deficit Latest Ref Range: 0.0 - 2.0 mmol/L 3.0 (H)  Bicarbonate Latest Ref Range: 20.0 - 28.0 mmol/L 21.4  O2 Saturation Latest Units: % 97.0  Patient temperature Unknown 97.7 F  Collection site Unknown RADIAL, ALLEN'S TEST ACCEPTABLE

## 2019-06-28 NOTE — ED Notes (Signed)
Date and time results received: 06/28/19 1344   Test: troponin  Critical Value: 11,576  Name of Provider Notified: Dr. Lanny Cramp RN informed of Troponin results and need for bed. Charge RN working on getting patient a bed. Val, RN called to start IV on pt while we are waiting for a bed.

## 2019-06-28 NOTE — Progress Notes (Signed)
Pt transported to CT and back to ER without complications.

## 2019-06-28 NOTE — Progress Notes (Signed)
Pt arrived at [facility] after transfer from [place].  She underwent LHC and PCI to LCx by Dr. Herbie Baltimore post-cardiac arrest earlier today. Please see cardiology notes from earlier today for further detail Herbie Baltimore and Gwen Pounds). Pt is stable hemodynamically. Currently in afib w/ HR in the 80s, intubated, sedated but is showing evidence of responsiveness. PCCM following.

## 2019-06-28 NOTE — Care Plan (Signed)
    Call from [facility] - ccm at [facility]. -> CCM MD at [facility] handling calls  [name] - prior RCA stent x 15 years ago. Chest pain at home -> trop up -> code stemi /CPR in ER  (5 min per Dr Herbie Baltimore and 1 shock) approx 3.30-4.30 ->  Going to cath labs 5:30 PM   -> needs ICU/Vent [facility] does not have beds). -> At 5:34 PM -> Moving arms and gagging on vent and will likely wake up -> He is going to stent the circumflex right now  [facility] no beds in 2H ccu  @ [facility]  D/w Dr Herbie Baltimore   -> Cardiology primary and ccm consult at [facility]  -> Unclear as of 5:36 PM if patient needs cooling - suggested if determination cannot be made by him and CCM at [facility] -> then we can revisit at [facility]    Dr. Kalman Shan, M.D., F.C.C.P,  Pulmonary and Critical Care Medicine Staff Physician, [facility] Center Director - Interstitial Lung Disease  Program  Pulmonary Fibrosis [facility] at [facility] [place], [place], [address]  Pager: [phone], If no answer or between  15:00h - 7:00h: call [phone] Telephone: [phone]  5:36 PM 06/28/2019

## 2019-06-28 NOTE — Consult Note (Signed)
INTERVENTIONAL CARDIOLOGY CONSULTATION:   Patient ID: Courtney Chan MRN: [id number]; DOB: 1931/02/09  Admit date: 06/28/2019 Date of Consult: 06/28/2019  Primary Care Provider: Marisue Ivan, MD Primary Cardiologist: Dr. Darrold Junker Primary Electrophysiologist:  None    Patient Profile:   Courtney Chan is a 86 y.o. female with a hx of CAD with PCI to the RCA and Circumflex in 2010 along with chronic persistent A. fib, prior CVA as well as hypertension hyperlipidemia who is being seen today for the evaluation of CARDIAC ARREST-VENTRICULAR FIBRILLATION WITH ELEVATED TROPONIN at the request of Dr. Fanny Bien [facility] Physician  History of Present Illness:   Courtney Chan was recently seen by Dr. Darrold Junker in November 2020-reportedly doing well with no major symptoms.  Very active walks at least 2 miles most days..  She was noted to have paroxysmal A. fib with a CHA2DS2-VASc score of 7, and is maintained on digoxin and metoprolol for rate control along with Pradaxa for anticoagulation.  His note also indicates prior CAD with stents in April 2010 to the RCA and circumflex.  Last stress test was in February 2019 with GXT walked 6 minutes with no ischemic changes.  Patient just had her second COVID-19 vaccine on Friday, June 26, 2019, and was otherwise doing well in the usual state of health.  She started having chest discomfort the evening of February 13 leading into the morning of February 14.  Intermittent chest discomfort overnight which brought her to the emergency room at [facility] presenting at roughly 12:50 PM. She was in the process of waiting in the waiting room with troponin levels have been checked, was called back into the emergency room at roughly 1350.  In transport from the waiting area to Her room at roughly 1410 hrs., she was noted to be unresponsive.  When she was placed on the monitor she was found to be pulseless and then what appeared to be ventricular tachycardia.  She did undergo  defibrillation x1 with additional 3 to 4 minutes of CPR.  Received atropine and epinephrine.  Had restoration of pulses by 1415 after the cursory 3 minutes of CPR following cardioversion.  Had restoration of pulse and was found to be in A. fib RVR with stable blood pressures.  Initial triage EKG Showed likely A. fib with low voltage making it difficult to determine, rate 103.  Borderline incomplete RBBB.  I was contacted to consider possible cardiac catheterization given elevated troponins and now cardiac arrest with new right bundle branch block in the setting of A. fib RVR and post cardiac arrest.  Patient is currently intubated and sedated.  Blood pressures improved from 80s systolic up to 110 with IV fluids.  Her heart rate is have also come down with now A. fib in the low 100s to 120s in A. fib.  She does have RBBB on EKG.  I had a long discussion with the patient's husband and daughter.  Her husband who is her healthcare power of attorney and spouse indicated that she had previously did discuss with him desire to not have CPR done were she to have an event like this, however given his current situation he feels it reasonable to proceed is though she has a good chance of survival.  She did have relatively rapid restoration of spontaneous rhythm and does seem to have some movements on exam.    At this point with it being mid afternoon on the Sunday with elevated troponins and cardiac arrest I do think is reasonable  to consider urgent catheterization for non-STEMI cardiac arrest.  We discussed plans going forward and they agreed with the plan to proceed with cardiac catheterization.  Heart Pathway Score:     Past Medical History:  Diagnosis Date  . Atrial fibrillation ([place]) 2011  . Hyperlipidemia   . Hypertension   . Stroke ([place])   . Thyroid disease     Past Surgical History:  Procedure Laterality Date  . ABDOMINAL HYSTERECTOMY    . BREAST BIOPSY Left 1980   Negative     Home  Medications:  Prior to Admission medications   Medication Sig Start Date End Date Taking? Authorizing Provider  alendronate (FOSAMAX) 70 MG tablet Take 70 mg by mouth once a week. 06/07/19  Yes [provider]  Calcium Carbonate-Vitamin D 600-400 MG-UNIT per tablet Take 1 tablet by mouth 2 (two) times daily.   Yes [provider]  dabigatran (PRADAXA) 75 MG CAPS capsule Take 1 capsule (75 mg total) by mouth 2 (two) times daily. 08/27/17  Yes Max Sane, MD  digoxin 62.5 MCG TABS Take 0.0625 mg by mouth daily. 08/28/17  Yes Max Sane, MD  levothyroxine (SYNTHROID, LEVOTHROID) 88 MCG tablet Take 88 mcg by mouth daily.    Yes [provider]  losartan (COZAAR) 25 MG tablet Take 25 mg by mouth daily.    Yes [provider]  lovastatin (MEVACOR) 40 MG tablet Take 40 mg by mouth at bedtime.    Yes [provider]  metoprolol succinate (TOPROL-XL) 100 MG 24 hr tablet Take 100 mg by mouth daily.    Yes [provider]  omeprazole (PRILOSEC) 20 MG capsule Take 20 mg by mouth daily.    Yes [provider]    Inpatient Medications: Scheduled Meds:  Continuous Infusions: . fentaNYL infusion INTRAVENOUS 100 mcg/hr (06/28/19 1446)   PRN Meds:   Allergies:    Allergies  Allergen Reactions  . Metronidazole Diarrhea and Nausea And Vomiting  . Oxycodone Other (See Comments)    Altered mental status    Social History:   Social History   Socioeconomic History  . Marital status: Married    Spouse name: Not on file  . Number of children: Not on file  . Years of education: Not on file  . Highest education level: Not on file  Occupational History  . Not on file  Tobacco Use  . Smoking status: Never Smoker  . Smokeless tobacco: Never Used  Substance and Sexual Activity  . Alcohol use: No  . Drug use: No  . Sexual activity: Not on file  Other Topics Concern  . Not on file  Social History Narrative  . Not on file   Social  Determinants of Health   Financial Resource Strain:   . Difficulty of Paying Living Expenses: Not on file  Food Insecurity:   . Worried About Charity fundraiser in the Last Year: Not on file  . Ran Out of Food in the Last Year: Not on file  Transportation Needs:   . Lack of Transportation (Medical): Not on file  . Lack of Transportation (Non-Medical): Not on file  Physical Activity:   . Days of Exercise per Week: Not on file  . Minutes of Exercise per Session: Not on file  Stress:   . Feeling of Stress : Not on file  Social Connections:   . Frequency of Communication with Friends and Family: Not on file  . Frequency of Social Gatherings with Friends and Family:  Not on file  . Attends Religious Services: Not on file  . Active Member of Clubs or Organizations: Not on file  . Attends Banker Meetings: Not on file  . Marital Status: Not on file  Intimate Partner Violence:   . Fear of Current or Ex-Partner: Not on file  . Emotionally Abused: Not on file  . Physically Abused: Not on file  . Sexually Abused: Not on file    Family History:   Family History  Problem Relation Age of Onset  . Breast cancer Maternal Grandmother        60's     ROS:  Please see the history of present illness.  Unable to obtain review of symptoms as the patient is intubated and sedated. Husband the case that she had been doing otherwise fairly well.  Had no adverse effects from her second COVID-19 vaccine.  Was doing fine until the evening of February 13. He did not indicate that she was having any issues with exertional dyspnea or chest pain prior to this weekend.  All other ROS reviewed and negative.     Physical Exam/Data:   Vitals:   06/28/19 1548 06/28/19 1551 06/28/19 1554 06/28/19 1600  BP: (!) 135/97 120/83 105/74 109/78  Pulse: (!) 136 72 (!) 49   Resp: (!) 21 (!) 21 (!) 22 (!) 22  Temp: 98.9 F (37.2 C) 98.9 F (37.2 C) 98.9 F (37.2 C) 98.8 F (37.1 C)  SpO2: 96% 99%  100%   Weight:      Height:       No intake or output data in the 24 hours ending 06/28/19 1625 Last 3 Weights 06/28/2019 02/18/2019 01/21/2019  Weight (lbs) 150 lb 150 lb 149 lb  Weight (kg) 68.04 kg 68.04 kg 67.586 kg     Body mass index is 25.75 kg/m.  General:  Well nourished, well developed; intubated and sedated, but does have some twitching movements and gagging with that in place. HEENT: normal -unable to really assess EOMI. Lymph: no adenopathy Neck: no JVD; ET tube in place Vascular: No carotid bruits; FA pulses 2+ bilaterally without bruits  Cardiac:  normal S1, S2; irregularly irregular rhythm with rapid rate.;  1/6 SEM at RUSB. Lungs: Coarse breath sounds from ventilator, mild crackles, but no rales or rhonchi.   Abd: soft, nontender, no hepatomegaly  Ext: no edema Musculoskeletal:  No deformities, unable to assess strength Skin: warm and dry  Neuro: Intubated and sedated.  Was talking and moving all extremities prior to arrest Psych:  Normal affect   EKG:  The post arrest EKG was personally reviewed and demonstrates: A. fib RVR rate was 65 bpm and RBBB.  Difficult to assess ST and T wave changes because of voltage and bundle branch block.  No obvious ST and T wave elevations besides potential mild elevation in aVR. Telemetry:  Telemetry was personally reviewed and demonstrates: A. fib RVR rates ranging from 105 to 120 bpm.  RBBB   Relevant CV Studies: Unfortunately I do not have studies available: By report has had stents placed in the RCA and circumflex in 2010 GXT in January 2019 showed no evidence of ischemia.  Laboratory Data:  High Sensitivity Troponin:   Recent Labs  Lab 06/28/19 1302 06/28/19 1522  TROPONINIHS 11,576* 13,408*     Chemistry Recent Labs  Lab 06/28/19 1302  NA 141  K 4.4  CL 108  CO2 24  GLUCOSE 91  BUN 19  CREATININE 1.05*  CALCIUM  9.2  GFRNONAA 48*  GFRAA 56*  ANIONGAP 9    No results for input(s): PROT, ALBUMIN, AST, ALT,  ALKPHOS, BILITOT in the last 168 hours. Hematology Recent Labs  Lab 06/28/19 1302  WBC 8.0  RBC 4.25  HGB 12.7  HCT 40.3  MCV 94.8  MCH 29.9  MCHC 31.5  RDW 13.8  PLT 386   BNPNo results for input(s): BNP, PROBNP in the last 168 hours.  DDimer No results for input(s): DDIMER in the last 168 hours.   Radiology/Studies:  DG Chest 2 View  Result Date: 06/28/2019 CLINICAL DATA:  Chest pain shortness of breath EXAM: CHEST - 2 VIEW COMPARISON:  August 25, 2017 FINDINGS: The mediastinal contour and cardiac silhouette are normal. Mild opacity of left lung base is identified at least in part due to atelectasis but superimposed early pneumonia is not excluded. There is no pulmonary edema or pleural effusion. The bony structures are stable. IMPRESSION: Mild opacity of left lung base at least in part due to atelectasis but superimposed early pneumonia is not excluded. Electronically Signed   By: Sherian Rein M.D.   On: 06/28/2019 14:41   CT Head Wo Contrast  Result Date: 06/28/2019 CLINICAL DATA:  Cardiac arrest, unresponsive and pulseless upon arrival to ED, intubated EXAM: CT HEAD WITHOUT CONTRAST TECHNIQUE: Contiguous axial images were obtained from the base of the skull through the vertex without intravenous contrast. COMPARISON:  06/28/2019 FINDINGS: Brain: Chronic small vessel ischemic changes are seen within the bilateral frontal periventricular white matter and left external capsule. No acute infarct or hemorrhage. Lateral ventricles and remaining midline structures are unremarkable. There are no acute extra-axial fluid collections. There is no mass effect. Vascular: No hyperdense vessel or unexpected calcification. Skull: Normal. Negative for fracture or focal lesion. Sinuses/Orbits: No acute finding. Other: None IMPRESSION: 1. Stable head CT, no acute intracranial process. Electronically Signed   By: Sharlet Salina M.D.   On: 06/28/2019 15:57   DG Chest Portable 1 View  Result Date:  06/28/2019 CLINICAL DATA:  Status post arrest, intubated EXAM: PORTABLE CHEST 1 VIEW COMPARISON:  06/28/2019 FINDINGS: Single frontal view of the chest demonstrates defibrillator pads overlying left chest. Endotracheal tube overlies tracheal air column, tip just below thoracic inlet. Enteric catheter passes below diaphragm, tip excluded by collimation. Numerous cardiac leads overlie the chest. Cardiac silhouette is stable. Since the prior exam, diffuse interstitial and ground-glass opacities have developed consistent with pulmonary edema. Small left pleural effusion is noted. There is no pneumothorax on this supine exam. No acute displaced fractures. IMPRESSION: 1. Support devices as above.  No evidence of malpositioning. 2. Interval development of mild pulmonary edema. Electronically Signed   By: Sharlet Salina M.D.   On: 06/28/2019 15:13   DG Abd Portable 1 View  Result Date: 06/28/2019 CLINICAL DATA:  Intubated, enteric catheter placement, arrest EXAM: PORTABLE ABDOMEN - 1 VIEW COMPARISON:  None. FINDINGS: Frontal view of the lower chest and upper abdomen demonstrates enteric catheter tip and side port projecting over gastric body. Numerous cardiac leads are identified. Defibrillator pads overlie the left chest. Bowel gas pattern is unremarkable. Interstitial and ground-glass opacities and small left pleural effusion consistent with pulmonary edema. IMPRESSION: 1. Enteric catheter overlying gastric body. 2. Pulmonary edema. Electronically Signed   By: Sharlet Salina M.D.   On: 06/28/2019 15:14       TIMI Risk Score for Unstable Angina or Non-ST Elevation MI:   The patient's TIMI risk score is 6, which indicates a  41% risk of all cause mortality, new or recurrent myocardial infarction or need for urgent revascularization in the next 14 days.   Assessment and Plan:   Principal Problem:   Non-ST elevation MI (NSTEMI) (HCC) Active Problems:   Cardiac arrest due to underlying cardiac condition [facility](HCC)    Atrial fibrillation with RVR (HCC)   DNR (do not resuscitate)   Essential hypertension  I was called to emergency room to assist in management of this elderly woman who has had cardiac arrest with known positive troponins after presenting with chest pain.  She does have a report of being a DNR DNI, however this was not known prior to her resuscitation.  Now that she is in a perfusing rhythm, I had a long discussion with the patient's husband and daughter.  Moving forward, in order to know the true cause of her cardiac arrest, I think the best option is to proceed with cardiac catheterization.  She does not meet true criteria for STEMI by EKG, but with positive troponins, chest pain presentation now with cardiac arrest, it stands to reason that she potentially has a cardiac etiology for her VF arrest.  She does have a history of stent to the circumflex which could potentially not show up on EKG.  Her bundle branch block does appear to be new which could potentially be indicated of ischemia.  Plan: We will proceed with urgent cardiac catheterization and possible PCI. She currently seems to be hemodynamically stable from a blood pressure standpoint after receiving a bolus in the ER: Low threshold to consider Levophed Remains in A. fib with RVR, appears to be chronic A. fib, however there is no clear indication whether or not she has been in and out of it as it is listed as paroxysmal.  May consider amiodarone for additional rate control.   We will defer management of A. fib and ventilator to primary cardiology team and critical care.  Or she does show signs of progressively worsening shock would consider [facility], but otherwise would keep her care here.   CARDIAC CATH CONSENT Performing MD:  Bryan Lemmaavid Hollan Philipp, M.D., M.S.  Procedure: LEFT HEART CATHETERIZATION WITH CORONARY GEOGRAPHY AND POSSIBLE PERCUTANEOUS CORONARY INTERVENTION  The procedure with Risks/Benefits/Alternatives and  Indications was reviewed with the patient's husband and daughter.  All questions were answered.    Risks / Complications include, but not limited to: Death, MI, CVA/TIA, VF/VT (with defibrillation), Bradycardia (need for temporary pacer placement), contrast induced nephropathy , (bleeding risk is slightly higher than the fact that she has had Pradaxa this morning) bleeding / bruising / hematoma / pseudoaneurysm, vascular or coronary injury (with possible emergent CT or Vascular Surgery), adverse medication reactions, infection.  Additional risks involving the use of radiation with the possibility of radiation burns and cancer were explained in detail.  The patient's family voice understanding and agree to proceed.     For questions or updates, please contact [facility] Please consult [email or web address] for contact info under     Signed, Bryan Lemmaavid Domnick Chervenak, MD  06/28/2019 4:25 PM

## 2019-06-28 NOTE — Progress Notes (Signed)
ANTICOAGULATION CONSULT NOTE - Initial Consult  Pharmacy Consult for Heparin  Indication: chest pain/ACS  Allergies  Allergen Reactions  . Metronidazole Diarrhea and Nausea And Vomiting  . Oxycodone Other (See Comments)    Altered mental status    Patient Measurements: Height: 5\' 4"  (162.6 cm) Weight: 150 lb (68 kg) IBW/kg (Calculated) : 54.7 Heparin Dosing Weight: 68 kg   Vital Signs: Temp: 98.6 F (37 C) (02/14 1645) BP: 90/60 (02/14 1833) Pulse Rate: 90 (02/14 1833)  Labs: Recent Labs    06/28/19 1302 06/28/19 1522  HGB 12.7  --   HCT 40.3  --   PLT 386  --   APTT  --  66*  LABPROT  --  25.2*  INR  --  2.3*  CREATININE 1.05*  --   TROPONINIHS 11,576* 13,408*    Estimated Creatinine Clearance: 36.4 mL/min (A) (by C-G formula based on SCr of 1.05 mg/dL (H)).   Medical History: Past Medical History:  Diagnosis Date  . Atrial fibrillation (HCC) 2011  . Hyperlipidemia   . Hypertension   . Stroke (HCC)   . Thyroid disease     Medications:  No medications prior to admission.    Assessment: Pharmacy consulted to dose heparin in this 86 year old female S/P cardiac cath/stent placement.   Sheath removal @ 1800.  Per MD, will start heparin 8 hrs post sheath removal and run until pradaxa starts on 2/15 @ 1000. CrCl = 36.4 ml/min   Goal of Therapy:  Heparin level 0.3-0.7 units/ml Monitor platelets by anticoagulation protocol: Yes   Plan:  Give 4000 units bolus x 1 Start heparin infusion at 800 units/hr on 2/15 @ 0200 , will run until Pradaxa given on 2/15 @ 1000.  Will d/c heparin gtt on 2/15 @ 0959, no need to draw level.   Zenaya Ulatowski D 06/28/2019,7:29 PM

## 2019-06-28 NOTE — Progress Notes (Signed)
Phone call for Ch response to ED@2 :11 p.m. patient coded while in transport to room from triage. Husband was making contact with daughter. I sat with husband until cardiologist could examine patient and report. Husband, then husband and daughter made and informed decision to continue care. Son arrived later, I offered words of prayer and comfort. Patient will eventually move to ICU unit, Ch office will follow up.

## 2019-06-28 NOTE — Code Documentation (Addendum)
Pt v fib on monitor. Shocked at [employer]

## 2019-06-28 NOTE — Consult Note (Addendum)
NAME:  Courtney Chan, MRN:  [id number], DOB:  Nov 29, 1930, LOS: 0 ADMISSION DATE:  06/28/2019, CONSULTATION DATE:  06/28/19 REFERRING MD:  Herbie Baltimore, CHIEF COMPLAINT:  NSTEMI   Brief History   86 year old female with history of hyperlipidemia, atrial fibrillation on Pradaxa, CKD, hypothyroidism who presented with chest pain and suffered NSTEMI in V. fib arrest in the ED.  ROSC achieved and taken to the Cath Lab for L circumflex stenting.  Intubated and transferred to [facility] due to lack of ICU beds at [facility].  History of present illness   86 year old female with history of hyperlipidemia, atrial fibrillation, CKD, hypothyroidism who presented to [facility] emergency department with 1 day of chest pain and had V. fib arrest in the ED with approximately 5 minutes ACLS and defibrillation and ROSC.  Found to have NSTEMI with elevated troponin and new RBBB and was taken to the Cath Lab for urgent cardiac catheterization and stenting of the L circumflex.  She was intubated and sedated though with improving neurologic exam and the decision was made not to cool.   She was transferred to [facility] due to lack of ICU bed at [facility].  On arrival she is hemodynamically stable off pressors; opening eyes to voice and following commands on minimal vent settings.  PCCM asked to consult alongside cardiology.  Past Medical History   has a past medical history of Atrial fibrillation (HCC) (2011), Hyperlipidemia, Hypertension, Stroke [facility]), and Thyroid disease.  Significant Hospital Events   2/14 presented to [facility], status post Cath Lab there and transferred to [facility]  Consults:  CCM  Procedures:  2/14-ETT 2/14-cardiac catheterization  Significant Diagnostic Tests:  2/14 CT head>> no acute findings 2/14 CXR>>central pulmonary vascular congestion and RIGHT suprahilar pulmonary edema  Micro Data:  2/14 SARS-Cov-2 and influenza>> negative  Antimicrobials:    Interim  history/subjective:  Patient arrived to [facility] in stable condition with improving neurologic exam  Objective   Blood pressure 111/78, pulse 93, temperature 97.7 F (36.5 C), temperature source Axillary, resp. rate (!) 21, height 5\' 4"  (1.626 m), weight 71.3 kg, SpO2 100 %.    Vent Mode: PRVC FiO2 (%):  [40 %-100 %] 40 % Set Rate:  [15 bmp-18 bmp] 15 bmp Vt Set:  [430 mL-450 mL] 430 mL PEEP:  [5 cmH20] 5 cmH20 Plateau Pressure:  [19 cmH20] 19 cmH20  No intake or output data in the 24 hours ending 06/28/19 2008 Filed Weights   06/28/19 1948  Weight: 71.3 kg    General: Elderly, well-nourished female intubated and mildly sedated HEENT: MM pink/moist, ET tube in place Neuro: Opens eyes to voice, following commands CV: s1s2 irregular rhythm, no m/r/g PULM: Decreased breath sounds bilateral bases, on ventilator support GI: soft, bsx4 active  Extremities: warm/dry, no edema  Skin: no rashes or lesions   Resolved Hospital Problem list     Assessment & Plan:    NSTEMI with V. Fib arrest s/p ROSC and urgent cardiac catheterization and PCI, baseline paroxysmal atrial fibrillation -Management and anticoagulation per primary team cardiology -Echo pending -A. fib is rate controlled at home with digoxin and metoprolol -Neurologic exam is improving, therefore TTM not initiated   Acute hypoxic respiratory failure secondary to cardiac arrest -Intubated in the ED, vent settings minimal on arrival P: -Check ABG and repeat chest x-ray -Continue fentanyl drip overnight for mild sedation -Possible extubation in the morning    History of CKD -Creatinine 1.05 near baseline P: -Monitor urine output  and repeat metabolic panel in the a.m.  History of hypothyroidism -Continue levothyroxine and check TSH  Best practice:  Diet: N.p.o. Pain/Anxiety/Delirium protocol (if indicated): Fentanyl drip VAP protocol (if indicated): HOB 30 degrees, daily wake-up assessment DVT prophylaxis:  Per primary GI prophylaxis: Protonix Glucose control: SSI Mobility: Bedrest Code Status: Full code Family Communication: Per primary Disposition: ICU  Labs   CBC: Recent Labs  Lab 06/28/19 1302  WBC 8.0  HGB 12.7  HCT 40.3  MCV 94.8  PLT 315    Basic Metabolic Panel: Recent Labs  Lab 06/28/19 1302  NA 141  K 4.4  CL 108  CO2 24  GLUCOSE 91  BUN 19  CREATININE 1.05*  CALCIUM 9.2   GFR: Estimated Creatinine Clearance: 37.2 mL/min (A) (by C-G formula based on SCr of 1.05 mg/dL (H)). Recent Labs  Lab 06/28/19 1302  WBC 8.0    Liver Function Tests: No results for input(s): AST, ALT, ALKPHOS, BILITOT, PROT, ALBUMIN in the last 168 hours. No results for input(s): LIPASE, AMYLASE in the last 168 hours. No results for input(s): AMMONIA in the last 168 hours.  ABG No results found for: PHART, PCO2ART, PO2ART, HCO3, TCO2, ACIDBASEDEF, O2SAT   Coagulation Profile: Recent Labs  Lab 06/28/19 1522  INR 2.3*    Cardiac Enzymes: No results for input(s): CKTOTAL, CKMB, CKMBINDEX, TROPONINI in the last 168 hours.  HbA1C: No results found for: HGBA1C  CBG: No results for input(s): GLUCAP in the last 168 hours.  Review of Systems:   Unable to obtain secondary to intubation  Past Medical History  She,  has a past medical history of Atrial fibrillation ([place]) (2011), Hyperlipidemia, Hypertension, Stroke [facility]), and Thyroid disease.   Surgical History    Past Surgical History:  Procedure Laterality Date  . ABDOMINAL HYSTERECTOMY    . BREAST BIOPSY Left 1980   Negative     Social History   reports that she has never smoked. She has never used smokeless tobacco. She reports that she does not drink alcohol or use drugs.   Family History   Her family history includes Breast cancer in her maternal grandmother.   Allergies Allergies  Allergen Reactions  . Metronidazole Diarrhea and Nausea And Vomiting  . Oxycodone Other (See Comments)    Altered mental  status     Home Medications  Prior to Admission medications   Medication Sig Start Date End Date Taking? Authorizing Provider  levothyroxine (SYNTHROID, LEVOTHROID) 88 MCG tablet Take 88 mcg by mouth daily.     [provider]     Critical care time: 50 minutes     CRITICAL CARE Performed by: Otilio Carpen Cathren Sween   Total critical care time: 50 minutes  Critical care time was exclusive of separately billable procedures and treating other patients.  Critical care was necessary to treat or prevent imminent or life-threatening deterioration.  Critical care was time spent personally by me on the following activities: development of treatment plan with patient and/or surrogate as well as nursing, discussions with consultants, evaluation of patient's response to treatment, examination of patient, obtaining history from patient or surrogate, ordering and performing treatments and interventions, ordering and review of laboratory studies, ordering and review of radiographic studies, pulse oximetry and re-evaluation of patient's condition.   Otilio Carpen Dewon Mendizabal, PA-C

## 2019-06-28 NOTE — ED Provider Notes (Signed)
    Procedure Name: Intubation Date/Time: 06/28/2019 4:35 PM Performed by: Racheal Patches, PA-C Pre-anesthesia Checklist: Patient identified, Emergency Drugs available, Suction available, Patient being monitored and Timeout performed Oxygen Delivery Method: Ambu bag Preoxygenation: Pre-oxygenation with 100% oxygen Induction Type: Rapid sequence Ventilation: Mask ventilation without difficulty Laryngoscope Size: Glidescope and 3 Tube size: 7.0 mm Number of attempts: 1 Airway Equipment and Method: Patient positioned with wedge pillow and Video-laryngoscopy Placement Confirmation: ETT inserted through vocal cords under direct vision,  Positive ETCO2,  CO2 detector and Breath sounds checked- equal and bilateral Tube secured with: ETT holder Comments: Patient was intubated with a glide scope.  Direct visualization of the vocal cords.  Good passing of the ET tube visually.  This confirmed with end-tidal, color metrics.  Equal breath sounds bilaterally and confirmed with chest x-ray.          Racheal Patches, PA-C 06/28/19 1641    Sharyn Creamer, MD 06/28/19 1705

## 2019-06-28 NOTE — H&P (Signed)
CRITICAL CARE PROGRESS NOTE    Name: Courtney Chan MRN: [id number] DOB: Sep 14, 1930     LOS: 0   SUBJECTIVE FINDINGS & SIGNIFICANT EVENTS   Patient description:  This is a 86 year old female she has a history of dyslipidemia atrial fibrillation essential hypertension CKD, hypothyroidism is code DNR, who reportedly started having chest pain day prior to admission, came in with unresponsive state and was found to have V. fib arrest status post ACLS with defibrillation and ROSC.  Cardiology was consulted due to elevated troponin consistent with ACS.  I had discussed care plan with Dr. Herbie Baltimore who plans to take patient to cardiac cath.  I discussed patient's overall condition with husband and daughter at bedside they understand that patient's overall prognosis is very poor and do not wish to have prolonged artificial life support.   Lines / Drains: PIVx2  Protocols / Consultants: Cardiology  Tests / Events: LHC and TTE   PAST MEDICAL HISTORY   Past Medical History:  Diagnosis Date  . Atrial fibrillation (HCC) 2011  . Hyperlipidemia   . Hypertension   . Stroke (HCC)   . Thyroid disease      SURGICAL HISTORY   Past Surgical History:  Procedure Laterality Date  . ABDOMINAL HYSTERECTOMY    . BREAST BIOPSY Left 1980   Negative     FAMILY HISTORY   Family History  Problem Relation Age of Onset  . Breast cancer Maternal Grandmother        53's     SOCIAL HISTORY   Social History   Tobacco Use  . Smoking status: Never Smoker  . Smokeless tobacco: Never Used  Substance Use Topics  . Alcohol use: No  . Drug use: No     MEDICATIONS   Current Medication:  Current Facility-Administered Medications:  .  fentaNYL in NS (61mcg/ml) infusion-PREMIX, 0-400 mcg/hr, Intravenous,  Continuous, Quale, Mark, MD, Last Rate: 10 mL/hr at 06/28/19 1446, 100 mcg/hr at 06/28/19 1446  Current Outpatient Medications:  .  alendronate (FOSAMAX) 70 MG tablet, Take 70 mg by mouth once a week., Disp: , Rfl:  .  Calcium Carbonate-Vitamin D 600-400 MG-UNIT per tablet, Take 1 tablet by mouth 2 (two) times daily., Disp: , Rfl:  .  dabigatran (PRADAXA) 75 MG CAPS capsule, Take 1 capsule (75 mg total) by mouth 2 (two) times daily., Disp: 60 capsule, Rfl: 0 .  digoxin 62.5 MCG TABS, Take 0.0625 mg by mouth daily., Disp: 30 tablet, Rfl: 0 .  levothyroxine (SYNTHROID, LEVOTHROID) 88 MCG tablet, Take 88 mcg by mouth daily. , Disp: , Rfl:  .  losartan (COZAAR) 25 MG tablet, Take 25 mg by mouth daily. , Disp: , Rfl:  .  lovastatin (MEVACOR) 40 MG tablet, Take 40 mg by mouth at bedtime. , Disp: , Rfl:  .  metoprolol succinate (TOPROL-XL) 100 MG 24 hr tablet, Take 100 mg by mouth daily. , Disp: , Rfl:  .  omeprazole (PRILOSEC) 20 MG capsule, Take 20 mg by mouth daily. , Disp: , Rfl:     ALLERGIES   Metronidazole and Oxycodone    REVIEW OF SYSTEMS    Unable to obtain well on mechanical ventilation  PHYSICAL EXAMINATION   Vital Signs: Temp:  [97 F (36.1 C)-99.2 F (37.3 C)] 98.5 F (36.9 C) (02/14 1639) Pulse Rate:  [43-136] 117 (02/14 1633) Resp:  [18-24] 20 (02/14 1639) BP: (84-139)/(59-98) 100/81 (02/14 1639) SpO2:  [96 %-100 %] 100 % (02/14 1633) FiO2 (%):  [  100 %] 100 % (02/14 1441) Weight:  [68 kg] 68 kg (02/14 1255)  GENERAL: Age-appropriate HEAD: Normocephalic, atraumatic.  EYES: Pupils equal, round, reactive to light.  No scleral icterus.  MOUTH: Moist mucosal membrane. NECK: Supple. No thyromegaly. No nodules. No JVD.  PULMONARY: CTAB CARDIOVASCULAR:Irregular in AF GASTROINTESTINAL: Soft, nontender, non-distended. No masses. Positive bowel sounds. No hepatosplenomegaly.  MUSCULOSKELETAL: No swelling, clubbing, or edema.  NEUROLOGIC: Mild distress due to acute  illness SKIN:intact,warm,dry   PERTINENT DATA     Infusions: . fentaNYL infusion INTRAVENOUS 100 mcg/hr (06/28/19 1446)   Scheduled Medications:  PRN Medications:  Hemodynamic parameters:   Intake/Output: No intake/output data recorded.  Ventilator  Settings: Vent Mode: AC FiO2 (%):  [100 %] 100 % Set Rate:  [18 bmp] 18 bmp Vt Set:  [450 mL] 450 mL PEEP:  [5 cmH20] 5 cmH20     LAB RESULTS:  Basic Metabolic Panel: Recent Labs  Lab 06/28/19 1302  NA 141  K 4.4  CL 108  CO2 24  GLUCOSE 91  BUN 19  CREATININE 1.05*  CALCIUM 9.2   Liver Function Tests: No results for input(s): AST, ALT, ALKPHOS, BILITOT, PROT, ALBUMIN in the last 168 hours. No results for input(s): LIPASE, AMYLASE in the last 168 hours. No results for input(s): AMMONIA in the last 168 hours. CBC: Recent Labs  Lab 06/28/19 1302  WBC 8.0  HGB 12.7  HCT 40.3  MCV 94.8  PLT 386   Cardiac Enzymes: No results for input(s): CKTOTAL, CKMB, CKMBINDEX, TROPONINI in the last 168 hours. BNP: Invalid input(s): POCBNP CBG: No results for input(s): GLUCAP in the last 168 hours.   IMAGING RESULTS:  Imaging: DG Chest 2 View  Result Date: 06/28/2019 CLINICAL DATA:  Chest pain shortness of breath EXAM: CHEST - 2 VIEW COMPARISON:  August 25, 2017 FINDINGS: The mediastinal contour and cardiac silhouette are normal. Mild opacity of left lung base is identified at least in part due to atelectasis but superimposed early pneumonia is not excluded. There is no pulmonary edema or pleural effusion. The bony structures are stable. IMPRESSION: Mild opacity of left lung base at least in part due to atelectasis but superimposed early pneumonia is not excluded. Electronically Signed   By: Sherian Rein M.D.   On: 06/28/2019 14:41   CT Head Wo Contrast  Result Date: 06/28/2019 CLINICAL DATA:  Cardiac arrest, unresponsive and pulseless upon arrival to ED, intubated EXAM: CT HEAD WITHOUT CONTRAST TECHNIQUE: Contiguous  axial images were obtained from the base of the skull through the vertex without intravenous contrast. COMPARISON:  06/28/2019 FINDINGS: Brain: Chronic small vessel ischemic changes are seen within the bilateral frontal periventricular white matter and left external capsule. No acute infarct or hemorrhage. Lateral ventricles and remaining midline structures are unremarkable. There are no acute extra-axial fluid collections. There is no mass effect. Vascular: No hyperdense vessel or unexpected calcification. Skull: Normal. Negative for fracture or focal lesion. Sinuses/Orbits: No acute finding. Other: None IMPRESSION: 1. Stable head CT, no acute intracranial process. Electronically Signed   By: Sharlet Salina M.D.   On: 06/28/2019 15:57   DG Chest Portable 1 View  Result Date: 06/28/2019 CLINICAL DATA:  Status post arrest, intubated EXAM: PORTABLE CHEST 1 VIEW COMPARISON:  06/28/2019 FINDINGS: Single frontal view of the chest demonstrates defibrillator pads overlying left chest. Endotracheal tube overlies tracheal air column, tip just below thoracic inlet. Enteric catheter passes below diaphragm, tip excluded by collimation. Numerous cardiac leads overlie the chest. Cardiac silhouette is  stable. Since the prior exam, diffuse interstitial and ground-glass opacities have developed consistent with pulmonary edema. Small left pleural effusion is noted. There is no pneumothorax on this supine exam. No acute displaced fractures. IMPRESSION: 1. Support devices as above.  No evidence of malpositioning. 2. Interval development of mild pulmonary edema. Electronically Signed   By: Randa Ngo M.D.   On: 06/28/2019 15:13   DG Abd Portable 1 View  Result Date: 06/28/2019 CLINICAL DATA:  Intubated, enteric catheter placement, arrest EXAM: PORTABLE ABDOMEN - 1 VIEW COMPARISON:  None. FINDINGS: Frontal view of the lower chest and upper abdomen demonstrates enteric catheter tip and side port projecting over gastric body.  Numerous cardiac leads are identified. Defibrillator pads overlie the left chest. Bowel gas pattern is unremarkable. Interstitial and ground-glass opacities and small left pleural effusion consistent with pulmonary edema. IMPRESSION: 1. Enteric catheter overlying gastric body. 2. Pulmonary edema. Electronically Signed   By: Randa Ngo M.D.   On: 06/28/2019 15:14      ASSESSMENT AND PLAN    -Multidisciplinary rounds held today  Cardiac Arrest   -Ventricular fibrillation   - s/p synchronized cardioversion  - cardiology on case - Discussed with DR Harding-plan for LHC    Atrial fibrillation     - cardiology on case - appreciate input    -on pradaxa at home   Renal Failure-most likely due transient hypotension -follow chem 7 -follow UO -continue Foley Catheter-assess need daily  GI/Nutrition GI PROPHYLAXIS as indicated DIET-->TF's as tolerated Constipation protocol as indicated  ENDO - ICU hypoglycemic\Hyperglycemia protocol -check FSBS per protocol   ELECTROLYTES -follow labs as needed -replace as needed -pharmacy consultation   DVT/GI PRX ordered -SCDs  TRANSFUSIONS AS NEEDED MONITOR FSBS ASSESS the need for LABS as needed   Critical care provider statement:    Critical care time (minutes):  33   Critical care time was exclusive of:  Separately billable procedures and treating other patients   Critical care was necessary to treat or prevent imminent or life-threatening deterioration of the following conditions:  cardiac arrest, ACLS, Ventricular fibrillation, aki, afib, multiple comorbid conditions.   Critical care was time spent personally by me on the following activities:  Development of treatment plan with patient or surrogate, discussions with consultants, evaluation of patient's response to treatment, examination of patient, obtaining history from patient or surrogate, ordering and performing treatments and interventions, ordering and review of laboratory  studies and re-evaluation of patient's condition.  I assumed direction of critical care for this patient from another provider in my specialty: no    This document was prepared using Dragon voice recognition software and may include unintentional dictation errors.    Ottie Glazier, M.D.  Division of [facility]

## 2019-06-28 NOTE — H&P (View-Only) (Signed)
INTERVENTIONAL CARDIOLOGY CONSULTATION:   Patient ID: [name] MRN: [id number]; DOB: 1931/02/09  Admit date: 06/28/2019 Date of Consult: 06/28/2019  Primary Care Provider: Marisue Ivan, MD Primary Cardiologist: Dr. Darrold Junker Primary Electrophysiologist:  None    Patient Profile:   OLUWASEUN BRUYERE is a 86 y.o. female with a hx of CAD with PCI to the RCA and Circumflex in 2010 along with chronic persistent A. fib, prior CVA as well as hypertension hyperlipidemia who is being seen today for the evaluation of CARDIAC ARREST-VENTRICULAR FIBRILLATION WITH ELEVATED TROPONIN at the request of Dr. Fanny Bien [facility] Physician  History of Present Illness:   Ms. Anastacio was recently seen by Dr. Darrold Junker in November 2020-reportedly doing well with no major symptoms.  Very active walks at least 2 miles most days..  She was noted to have paroxysmal A. fib with a CHA2DS2-VASc score of 7, and is maintained on digoxin and metoprolol for rate control along with Pradaxa for anticoagulation.  His note also indicates prior CAD with stents in April 2010 to the RCA and circumflex.  Last stress test was in February 2019 with GXT walked 6 minutes with no ischemic changes.  Patient just had her second COVID-19 vaccine on Friday, June 26, 2019, and was otherwise doing well in the usual state of health.  She started having chest discomfort the evening of February 13 leading into the morning of February 14.  Intermittent chest discomfort overnight which brought her to the emergency room at [facility] presenting at roughly 12:50 PM. She was in the process of waiting in the waiting room with troponin levels have been checked, was called back into the emergency room at roughly 1350.  In transport from the waiting area to Her room at roughly 1410 hrs., she was noted to be unresponsive.  When she was placed on the monitor she was found to be pulseless and then what appeared to be ventricular tachycardia.  She did undergo  defibrillation x1 with additional 3 to 4 minutes of CPR.  Received atropine and epinephrine.  Had restoration of pulses by 1415 after the cursory 3 minutes of CPR following cardioversion.  Had restoration of pulse and was found to be in A. fib RVR with stable blood pressures.  Initial triage EKG Showed likely A. fib with low voltage making it difficult to determine, rate 103.  Borderline incomplete RBBB.  I was contacted to consider possible cardiac catheterization given elevated troponins and now cardiac arrest with new right bundle branch block in the setting of A. fib RVR and post cardiac arrest.  Patient is currently intubated and sedated.  Blood pressures improved from 80s systolic up to 110 with IV fluids.  Her heart rate is have also come down with now A. fib in the low 100s to 120s in A. fib.  She does have RBBB on EKG.  I had a long discussion with the patient's husband and daughter.  Her husband who is her healthcare power of attorney and spouse indicated that she had previously did discuss with him desire to not have CPR done were she to have an event like this, however given his current situation he feels it reasonable to proceed is though she has a good chance of survival.  She did have relatively rapid restoration of spontaneous rhythm and does seem to have some movements on exam.    At this point with it being mid afternoon on the Sunday with elevated troponins and cardiac arrest I do think is reasonable  to consider urgent catheterization for non-STEMI cardiac arrest.  We discussed plans going forward and they agreed with the plan to proceed with cardiac catheterization.  Heart Pathway Score:     Past Medical History:  Diagnosis Date  . Atrial fibrillation ([place]) 2011  . Hyperlipidemia   . Hypertension   . Stroke ([place])   . Thyroid disease     Past Surgical History:  Procedure Laterality Date  . ABDOMINAL HYSTERECTOMY    . BREAST BIOPSY Left 1980   Negative     Home  Medications:  Prior to Admission medications   Medication Sig Start Date End Date Taking? Authorizing Provider  alendronate (FOSAMAX) 70 MG tablet Take 70 mg by mouth once a week. 06/07/19  Yes [provider]  Calcium Carbonate-Vitamin D 600-400 MG-UNIT per tablet Take 1 tablet by mouth 2 (two) times daily.   Yes [provider]  dabigatran (PRADAXA) 75 MG CAPS capsule Take 1 capsule (75 mg total) by mouth 2 (two) times daily. 08/27/17  Yes Max Sane, MD  digoxin 62.5 MCG TABS Take 0.0625 mg by mouth daily. 08/28/17  Yes Max Sane, MD  levothyroxine (SYNTHROID, LEVOTHROID) 88 MCG tablet Take 88 mcg by mouth daily.    Yes [provider]  losartan (COZAAR) 25 MG tablet Take 25 mg by mouth daily.    Yes [provider]  lovastatin (MEVACOR) 40 MG tablet Take 40 mg by mouth at bedtime.    Yes [provider]  metoprolol succinate (TOPROL-XL) 100 MG 24 hr tablet Take 100 mg by mouth daily.    Yes [provider]  omeprazole (PRILOSEC) 20 MG capsule Take 20 mg by mouth daily.    Yes [provider]    Inpatient Medications: Scheduled Meds:  Continuous Infusions: . fentaNYL infusion INTRAVENOUS 100 mcg/hr (06/28/19 1446)   PRN Meds:   Allergies:    Allergies  Allergen Reactions  . Metronidazole Diarrhea and Nausea And Vomiting  . Oxycodone Other (See Comments)    Altered mental status    Social History:   Social History   Socioeconomic History  . Marital status: Married    Spouse name: Not on file  . Number of children: Not on file  . Years of education: Not on file  . Highest education level: Not on file  Occupational History  . Not on file  Tobacco Use  . Smoking status: Never Smoker  . Smokeless tobacco: Never Used  Substance and Sexual Activity  . Alcohol use: No  . Drug use: No  . Sexual activity: Not on file  Other Topics Concern  . Not on file  Social History Narrative  . Not on file   Social  Determinants of Health   Financial Resource Strain:   . Difficulty of Paying Living Expenses: Not on file  Food Insecurity:   . Worried About Charity fundraiser in the Last Year: Not on file  . Ran Out of Food in the Last Year: Not on file  Transportation Needs:   . Lack of Transportation (Medical): Not on file  . Lack of Transportation (Non-Medical): Not on file  Physical Activity:   . Days of Exercise per Week: Not on file  . Minutes of Exercise per Session: Not on file  Stress:   . Feeling of Stress : Not on file  Social Connections:   . Frequency of Communication with Friends and Family: Not on file  . Frequency of Social Gatherings with Friends and Family:  Not on file  . Attends Religious Services: Not on file  . Active Member of Clubs or Organizations: Not on file  . Attends Banker Meetings: Not on file  . Marital Status: Not on file  Intimate Partner Violence:   . Fear of Current or Ex-Partner: Not on file  . Emotionally Abused: Not on file  . Physically Abused: Not on file  . Sexually Abused: Not on file    Family History:   Family History  Problem Relation Age of Onset  . Breast cancer Maternal Grandmother        60's     ROS:  Please see the history of present illness.  Unable to obtain review of symptoms as the patient is intubated and sedated. Husband the case that she had been doing otherwise fairly well.  Had no adverse effects from her second COVID-19 vaccine.  Was doing fine until the evening of February 13. He did not indicate that she was having any issues with exertional dyspnea or chest pain prior to this weekend.  All other ROS reviewed and negative.     Physical Exam/Data:   Vitals:   06/28/19 1548 06/28/19 1551 06/28/19 1554 06/28/19 1600  BP: (!) 135/97 120/83 105/74 109/78  Pulse: (!) 136 72 (!) 49   Resp: (!) 21 (!) 21 (!) 22 (!) 22  Temp: 98.9 F (37.2 C) 98.9 F (37.2 C) 98.9 F (37.2 C) 98.8 F (37.1 C)  SpO2: 96% 99%  100%   Weight:      Height:       No intake or output data in the 24 hours ending 06/28/19 1625 Last 3 Weights 06/28/2019 02/18/2019 01/21/2019  Weight (lbs) 150 lb 150 lb 149 lb  Weight (kg) 68.04 kg 68.04 kg 67.586 kg     Body mass index is 25.75 kg/m.  General:  Well nourished, well developed; intubated and sedated, but does have some twitching movements and gagging with that in place. HEENT: normal -unable to really assess EOMI. Lymph: no adenopathy Neck: no JVD; ET tube in place Vascular: No carotid bruits; FA pulses 2+ bilaterally without bruits  Cardiac:  normal S1, S2; irregularly irregular rhythm with rapid rate.;  1/6 SEM at RUSB. Lungs: Coarse breath sounds from ventilator, mild crackles, but no rales or rhonchi.   Abd: soft, nontender, no hepatomegaly  Ext: no edema Musculoskeletal:  No deformities, unable to assess strength Skin: warm and dry  Neuro: Intubated and sedated.  Was talking and moving all extremities prior to arrest Psych:  Normal affect   EKG:  The post arrest EKG was personally reviewed and demonstrates: A. fib RVR rate was 65 bpm and RBBB.  Difficult to assess ST and T wave changes because of voltage and bundle branch block.  No obvious ST and T wave elevations besides potential mild elevation in aVR. Telemetry:  Telemetry was personally reviewed and demonstrates: A. fib RVR rates ranging from 105 to 120 bpm.  RBBB   Relevant CV Studies: Unfortunately I do not have studies available: By report has had stents placed in the RCA and circumflex in 2010 GXT in January 2019 showed no evidence of ischemia.  Laboratory Data:  High Sensitivity Troponin:   Recent Labs  Lab 06/28/19 1302 06/28/19 1522  TROPONINIHS 11,576* 13,408*     Chemistry Recent Labs  Lab 06/28/19 1302  NA 141  K 4.4  CL 108  CO2 24  GLUCOSE 91  BUN 19  CREATININE 1.05*  CALCIUM  9.2  GFRNONAA 48*  GFRAA 56*  ANIONGAP 9    No results for input(s): PROT, ALBUMIN, AST, ALT,  ALKPHOS, BILITOT in the last 168 hours. Hematology Recent Labs  Lab 06/28/19 1302  WBC 8.0  RBC 4.25  HGB 12.7  HCT 40.3  MCV 94.8  MCH 29.9  MCHC 31.5  RDW 13.8  PLT 386   BNPNo results for input(s): BNP, PROBNP in the last 168 hours.  DDimer No results for input(s): DDIMER in the last 168 hours.   Radiology/Studies:  DG Chest 2 View  Result Date: 06/28/2019 CLINICAL DATA:  Chest pain shortness of breath EXAM: CHEST - 2 VIEW COMPARISON:  August 25, 2017 FINDINGS: The mediastinal contour and cardiac silhouette are normal. Mild opacity of left lung base is identified at least in part due to atelectasis but superimposed early pneumonia is not excluded. There is no pulmonary edema or pleural effusion. The bony structures are stable. IMPRESSION: Mild opacity of left lung base at least in part due to atelectasis but superimposed early pneumonia is not excluded. Electronically Signed   By: Sherian Rein M.D.   On: 06/28/2019 14:41   CT Head Wo Contrast  Result Date: 06/28/2019 CLINICAL DATA:  Cardiac arrest, unresponsive and pulseless upon arrival to ED, intubated EXAM: CT HEAD WITHOUT CONTRAST TECHNIQUE: Contiguous axial images were obtained from the base of the skull through the vertex without intravenous contrast. COMPARISON:  06/28/2019 FINDINGS: Brain: Chronic small vessel ischemic changes are seen within the bilateral frontal periventricular white matter and left external capsule. No acute infarct or hemorrhage. Lateral ventricles and remaining midline structures are unremarkable. There are no acute extra-axial fluid collections. There is no mass effect. Vascular: No hyperdense vessel or unexpected calcification. Skull: Normal. Negative for fracture or focal lesion. Sinuses/Orbits: No acute finding. Other: None IMPRESSION: 1. Stable head CT, no acute intracranial process. Electronically Signed   By: Sharlet Salina M.D.   On: 06/28/2019 15:57   DG Chest Portable 1 View  Result Date:  06/28/2019 CLINICAL DATA:  Status post arrest, intubated EXAM: PORTABLE CHEST 1 VIEW COMPARISON:  06/28/2019 FINDINGS: Single frontal view of the chest demonstrates defibrillator pads overlying left chest. Endotracheal tube overlies tracheal air column, tip just below thoracic inlet. Enteric catheter passes below diaphragm, tip excluded by collimation. Numerous cardiac leads overlie the chest. Cardiac silhouette is stable. Since the prior exam, diffuse interstitial and ground-glass opacities have developed consistent with pulmonary edema. Small left pleural effusion is noted. There is no pneumothorax on this supine exam. No acute displaced fractures. IMPRESSION: 1. Support devices as above.  No evidence of malpositioning. 2. Interval development of mild pulmonary edema. Electronically Signed   By: Sharlet Salina M.D.   On: 06/28/2019 15:13   DG Abd Portable 1 View  Result Date: 06/28/2019 CLINICAL DATA:  Intubated, enteric catheter placement, arrest EXAM: PORTABLE ABDOMEN - 1 VIEW COMPARISON:  None. FINDINGS: Frontal view of the lower chest and upper abdomen demonstrates enteric catheter tip and side port projecting over gastric body. Numerous cardiac leads are identified. Defibrillator pads overlie the left chest. Bowel gas pattern is unremarkable. Interstitial and ground-glass opacities and small left pleural effusion consistent with pulmonary edema. IMPRESSION: 1. Enteric catheter overlying gastric body. 2. Pulmonary edema. Electronically Signed   By: Sharlet Salina M.D.   On: 06/28/2019 15:14       TIMI Risk Score for Unstable Angina or Non-ST Elevation MI:   The patient's TIMI risk score is 6, which indicates a  41% risk of all cause mortality, new or recurrent myocardial infarction or need for urgent revascularization in the next 14 days.   Assessment and Plan:   Principal Problem:   Non-ST elevation MI (NSTEMI) (HCC) Active Problems:   Cardiac arrest due to underlying cardiac condition [facility])    Atrial fibrillation with RVR (HCC)   DNR (do not resuscitate)   Essential hypertension  I was called to emergency room to assist in management of this elderly woman who has had cardiac arrest with known positive troponins after presenting with chest pain.  She does have a report of being a DNR DNI, however this was not known prior to her resuscitation.  Now that she is in a perfusing rhythm, I had a long discussion with the patient's husband and daughter.  Moving forward, in order to know the true cause of her cardiac arrest, I think the best option is to proceed with cardiac catheterization.  She does not meet true criteria for STEMI by EKG, but with positive troponins, chest pain presentation now with cardiac arrest, it stands to reason that she potentially has a cardiac etiology for her VF arrest.  She does have a history of stent to the circumflex which could potentially not show up on EKG.  Her bundle branch block does appear to be new which could potentially be indicated of ischemia.  Plan: We will proceed with urgent cardiac catheterization and possible PCI. She currently seems to be hemodynamically stable from a blood pressure standpoint after receiving a bolus in the ER: Low threshold to consider Levophed Remains in A. fib with RVR, appears to be chronic A. fib, however there is no clear indication whether or not she has been in and out of it as it is listed as paroxysmal.  May consider amiodarone for additional rate control.   We will defer management of A. fib and ventilator to primary cardiology team and critical care.  Or she does show signs of progressively worsening shock would consider transfer to [facility], but otherwise would keep her care here.   CARDIAC CATH CONSENT Performing MD:  Bryan Lemma, M.D., M.S.  Procedure: LEFT HEART CATHETERIZATION WITH CORONARY GEOGRAPHY AND POSSIBLE PERCUTANEOUS CORONARY INTERVENTION  The procedure with Risks/Benefits/Alternatives and  Indications was reviewed with the patient's husband and daughter.  All questions were answered.    Risks / Complications include, but not limited to: Death, MI, CVA/TIA, VF/VT (with defibrillation), Bradycardia (need for temporary pacer placement), contrast induced nephropathy , (bleeding risk is slightly higher than the fact that she has had Pradaxa this morning) bleeding / bruising / hematoma / pseudoaneurysm, vascular or coronary injury (with possible emergent CT or Vascular Surgery), adverse medication reactions, infection.  Additional risks involving the use of radiation with the possibility of radiation burns and cancer were explained in detail.  The patient's family voice understanding and agree to proceed.     For questions or updates, please contact [facility] Please consult [email or web address] for contact info under     Signed, Bryan Lemma, MD  06/28/2019 4:25 PM

## 2019-06-28 NOTE — Code Documentation (Addendum)
Pulse check. Pulses returned, continued ventilation through BVM. CPR discontinued.

## 2019-06-28 NOTE — ED Provider Notes (Signed)
[facility] Emergency Department Provider Note   ____________________________________________   First MD Initiated Contact with Patient 06/28/19 1430     (approximate)  I have reviewed the triage vital signs and the nursing notes.   HISTORY  Chief Complaint Chest Pain and Shortness of Breath  EM caveat: Patient cardiac arrest, unresponsive  HPI Courtney Chan is a 86 y.o. female   who initially arrived to the ER with concerns of chest pain and shortness of breath.  Reported chest pain started last night.  Patient was in the waiting area, awaiting a treatment bed when her troponin resulted positive.  On her way to the ED treatment bed she was noted to become unresponsive.  On arrival I was called immediately by nursing staff and the patient was found to be in cardiac arrest  Per the patient's husband she is not been any recent illness, she has been in her normal health.  Has a history of heart disease with stents and A. fib  Past Medical History:  Diagnosis Date  . Atrial fibrillation (HCC) 2011  . Hyperlipidemia   . Hypertension   . Stroke (HCC)   . Thyroid disease     Patient Active Problem List   Diagnosis Date Noted  . PAD (peripheral artery disease) (HCC) 01/11/2019  . Acute cystitis without hematuria 12/31/2018  . Diarrhea 12/31/2018  . CKD (chronic kidney disease) stage 3, GFR 30-59 ml/min 05/27/2018  . History of TIA (transient ischemic attack) 05/27/2018  . A-fib (HCC) 08/25/2017  . Osteopenia of multiple sites 11/09/2016  . DNR (do not resuscitate) 10/31/2016  . Encounter for general adult medical examination without abnormal findings 05/02/2016  . Vaccine counseling 11/01/2015  . Borderline diabetes mellitus 11/03/2013  . Pure hypercholesterolemia 11/03/2013  . Atrial fibrillation (HCC) 09/14/2013  . Acquired hypothyroidism 09/14/2013  . Diverticulosis 09/14/2013  . Essential hypertension 09/14/2013  . GERD without esophagitis  09/14/2013  . Mitral valve prolapse 09/14/2013  . H/O cardiac catheterization 08/12/2008    Past Surgical History:  Procedure Laterality Date  . ABDOMINAL HYSTERECTOMY    . BREAST BIOPSY Left 1980   Negative    Prior to Admission medications   Medication Sig Start Date End Date Taking? Authorizing Provider  alendronate (FOSAMAX) 70 MG tablet Take 70 mg by mouth once a week. 06/07/19  Yes [provider]  Calcium Carbonate-Vitamin D 600-400 MG-UNIT per tablet Take 1 tablet by mouth 2 (two) times daily.   Yes [provider]  dabigatran (PRADAXA) 75 MG CAPS capsule Take 1 capsule (75 mg total) by mouth 2 (two) times daily. 08/27/17  Yes Delfino Lovett, MD  digoxin 62.5 MCG TABS Take 0.0625 mg by mouth daily. 08/28/17  Yes Delfino Lovett, MD  levothyroxine (SYNTHROID, LEVOTHROID) 88 MCG tablet Take 88 mcg by mouth daily.    Yes [provider]  losartan (COZAAR) 25 MG tablet Take 25 mg by mouth daily.    Yes [provider]  lovastatin (MEVACOR) 40 MG tablet Take 40 mg by mouth at bedtime.    Yes [provider]  metoprolol succinate (TOPROL-XL) 100 MG 24 hr tablet Take 100 mg by mouth daily.    Yes [provider]  omeprazole (PRILOSEC) 20 MG capsule Take 20 mg by mouth daily.    Yes [provider]  Husband reports patient would have last taken her Pradaxa this morning  Allergies Metronidazole and Oxycodone  Family History  Problem Relation Age of Onset  . Breast  cancer Maternal Grandmother        28's    Social History Social History   Tobacco Use  . Smoking status: Never Smoker  . Smokeless tobacco: Never Used  Substance Use Topics  . Alcohol use: No  . Drug use: No    Review of Systems     ____________________________________________   PHYSICAL EXAM:  VITAL SIGNS: ED Triage Vitals  Enc Vitals Group     BP 06/28/19 1254 (!) 139/92     Pulse Rate 06/28/19 1254 85     Resp 06/28/19 1254 18     Temp  06/28/19 1254 97.8 F (36.6 C)     Temp src --      SpO2 06/28/19 1254 98 %     Weight 06/28/19 1255 150 lb (68 kg)     Height 06/28/19 1255 5\' 4"  (1.626 m)     Head Circumference --      Peak Flow --      Pain Score 06/28/19 1255 6     Pain Loc --      Pain Edu? --      Excl. in GC? --     Constitutional: Unresponsive with agonal respirations pale in appearance Eyes: Conjunctivae are normal. Head: Atraumatic. Nose: No congestion/rhinnorhea. Mouth/Throat: Mucous membranes are moist. Cardiovascular: Ventricular fibrillation  respiratory: Agonal respiration Gastrointestinal:No distention. Musculoskeletal: No lower extremity tenderness nor edema.  Flaccid all extremities. Neurologic: Unable.  Unresponsive.   Skin:  Skin is warm, dry and intact. No rash noted. Psychiatric: Mood and affect are unable to be assessed  ____________________________________________   LABS (all labs ordered are listed, but only abnormal results are displayed)  Labs Reviewed  BASIC METABOLIC PANEL - Abnormal; Notable for the following components:      Result Value   Creatinine, Ser 1.05 (*)    GFR calc non Af Amer 48 (*)    GFR calc Af Amer 56 (*)    All other components within normal limits  DIGOXIN LEVEL - Abnormal; Notable for the following components:   Digoxin Level 0.3 (*)    All other components within normal limits  PROTIME-INR - Abnormal; Notable for the following components:   Prothrombin Time 25.2 (*)    INR 2.3 (*)    All other components within normal limits  APTT - Abnormal; Notable for the following components:   aPTT 66 (*)    All other components within normal limits  TROPONIN I (HIGH SENSITIVITY) - Abnormal; Notable for the following components:   Troponin I (High Sensitivity) 11,576 (*)    All other components within normal limits  TROPONIN I (HIGH SENSITIVITY) - Abnormal; Notable for the following components:   Troponin I (High Sensitivity) 13,408 (*)    All other  components within normal limits  RESPIRATORY PANEL BY RT PCR (FLU A&B, COVID)  CBC   ____________________________________________  EKG  Initial EKG reviewed at 1 PM Heart rate 105 QRS 70 QTc 430 Atrial fibrillation, minimal elevation of rate.  No evidence of acute ST elevation ischemia denoted.  Mild nonspecific T wave abnormality  Repeat EKG reviewed at 1445 Postarrest EKG Heart rate 150 QRS 120 QTc 450 Atrial fibrillation, rapid ventricular response.  Mild T wave depressions in V1 V2, right bundle branch-like pattern.  T wave inversion some downward scooping in V3.  Also depressions noted in V4 V5.  No STEMI.  This EKG was reviewed closely by myself, also reviewed by Dr. 06/30/19 in the ER ____________________________________________  RADIOLOGY  DG Chest 2 View  Result Date: 06/28/2019 CLINICAL DATA:  Chest pain shortness of breath EXAM: CHEST - 2 VIEW COMPARISON:  August 25, 2017 FINDINGS: The mediastinal contour and cardiac silhouette are normal. Mild opacity of left lung base is identified at least in part due to atelectasis but superimposed early pneumonia is not excluded. There is no pulmonary edema or pleural effusion. The bony structures are stable. IMPRESSION: Mild opacity of left lung base at least in part due to atelectasis but superimposed early pneumonia is not excluded. Electronically Signed   By: Abelardo Diesel M.D.   On: 06/28/2019 14:41   CT Head Wo Contrast  Result Date: 06/28/2019 CLINICAL DATA:  Cardiac arrest, unresponsive and pulseless upon arrival to ED, intubated EXAM: CT HEAD WITHOUT CONTRAST TECHNIQUE: Contiguous axial images were obtained from the base of the skull through the vertex without intravenous contrast. COMPARISON:  06/28/2019 FINDINGS: Brain: Chronic small vessel ischemic changes are seen within the bilateral frontal periventricular white matter and left external capsule. No acute infarct or hemorrhage. Lateral ventricles and remaining midline  structures are unremarkable. There are no acute extra-axial fluid collections. There is no mass effect. Vascular: No hyperdense vessel or unexpected calcification. Skull: Normal. Negative for fracture or focal lesion. Sinuses/Orbits: No acute finding. Other: None IMPRESSION: 1. Stable head CT, no acute intracranial process. Electronically Signed   By: Randa Ngo M.D.   On: 06/28/2019 15:57   DG Chest Portable 1 View  Result Date: 06/28/2019 CLINICAL DATA:  Status post arrest, intubated EXAM: PORTABLE CHEST 1 VIEW COMPARISON:  06/28/2019 FINDINGS: Single frontal view of the chest demonstrates defibrillator pads overlying left chest. Endotracheal tube overlies tracheal air column, tip just below thoracic inlet. Enteric catheter passes below diaphragm, tip excluded by collimation. Numerous cardiac leads overlie the chest. Cardiac silhouette is stable. Since the prior exam, diffuse interstitial and ground-glass opacities have developed consistent with pulmonary edema. Small left pleural effusion is noted. There is no pneumothorax on this supine exam. No acute displaced fractures. IMPRESSION: 1. Support devices as above.  No evidence of malpositioning. 2. Interval development of mild pulmonary edema. Electronically Signed   By: Randa Ngo M.D.   On: 06/28/2019 15:13   DG Abd Portable 1 View  Result Date: 06/28/2019 CLINICAL DATA:  Intubated, enteric catheter placement, arrest EXAM: PORTABLE ABDOMEN - 1 VIEW COMPARISON:  None. FINDINGS: Frontal view of the lower chest and upper abdomen demonstrates enteric catheter tip and side port projecting over gastric body. Numerous cardiac leads are identified. Defibrillator pads overlie the left chest. Bowel gas pattern is unremarkable. Interstitial and ground-glass opacities and small left pleural effusion consistent with pulmonary edema. IMPRESSION: 1. Enteric catheter overlying gastric body. 2. Pulmonary edema. Electronically Signed   By: Randa Ngo M.D.    On: 06/28/2019 15:14    Head CT reviewed negative for acute.  Enteric tube overlying gastric body.  Chest x-ray, appropriate positioning of endotracheal tube.  Mild pulmonary edema ____________________________________________   PROCEDURES  Procedure(s) performed: CPR  Procedures  Critical Care performed: Yes, see critical care note(s)  Cardiopulmonary Resuscitation (CPR) Procedure Note Directed/Performed by: Delman Kitten I personally directed ancillary staff and/or performed CPR in an effort to regain return of spontaneous circulation and to maintain cardiac, neuro and systemic perfusion.   CRITICAL CARE Performed by: Delman Kitten   Total critical care time: 85 minutes  Critical care time was exclusive of separately billable procedures and treating other patients.  Critical care was necessary to treat or  prevent imminent or life-threatening deterioration.  Critical care was time spent personally by me on the following activities: development of treatment plan with patient and/or surrogate as well as nursing, discussions with consultants, evaluation of patient's response to treatment, examination of patient, obtaining history from patient or surrogate, ordering and performing treatments and interventions, ordering and review of laboratory studies, ordering and review of radiographic studies, pulse oximetry and re-evaluation of patient's condition.  Patient had extensive critical care time.  Much of this involved discussion with family, planning with cardiology, review of labs, reassessments of the patient, and ongoing extensive care due to the gravity of her situation and post resuscitation status.  ____________________________________________   INITIAL IMPRESSION / ASSESSMENT AND PLAN / ED COURSE  Pertinent labs & imaging results that were available during my care of the patient were reviewed by me and considered in my medical decision making (see chart for details).   Patient  presents for chest pain, then had a witnessed cardiac arrest.  Her initial EKG did not appear frankly severely ischemic but her troponin did come back elevated.  As she was being brought to a treatment bed she arrested.  She received immediate CPR with resuscitation and was defibrillated once with return of circulation.  Immediately involve cardiology consultations in her care including cardiologist Dr. Gwen Pounds and Dr. Herbie Baltimore.  Her work-up thus far has revealed what appears to be likely cardiac etiology for her witnessed shockable arrest and chest pain though it cannot be said that a STEMI is evident.  Clinical Course as of Jun 27 1616  Wynelle Link Jun 28, 2019  1437 Case, care, ekgs to this point discusse and reviewed with Dr. Gwen Pounds   [MQ]  1445 Dr. Gwen Pounds of cardiology will be coming to see in consult on the patient stat.  He does advise at this point against providing medications for rate control.  See evaluate and consult, consider heparin infusion.  Recommends discussion with interventional cardiology whom I have paged at this time.  Additionally placed consultation to critical care medicine Dr. Karna Christmas   [MQ]  [phone] Case, care, ekgs reviewed with Dr. Herbie Baltimore of cardiology (on call for stemi/intervention)   [MQ]    Clinical Course User Index [MQ] Sharyn Creamer, MD   ----------------------------------------- 4:38 PM on 06/28/2019 -----------------------------------------  Patient continue to be monitored closely, currently Dr. Herbie Baltimore planning to take the patient for cardiac catheterization.  Rectal ASA given. INR > 2. Dr. Karna Christmas of [facility] has been consulted and both Dr. Herbie Baltimore and Dr. Gwen Pounds have evaluated in the ER.  Ongoing care assigned to Dr. Cyril Loosen.   ____________________________________________   FINAL CLINICAL IMPRESSION(S) / ED DIAGNOSES  Final diagnoses:  Cardiac Arrest  Ventricular Fibrillation        Note:  This document was prepared using Dragon voice recognition  software and may include unintentional dictation errors       Sharyn Creamer, MD 06/28/19 1642

## 2019-06-28 NOTE — ED Notes (Signed)
Pt transported to cardiac cath at this time.

## 2019-06-28 NOTE — Progress Notes (Signed)
Post-cath patient became more hemodynamically stable.  Heart rates ranging from 75-95, blood pressures ranging from 90/60-105/60 5 mmHg.Marland Kitchen  Patient remains intubated, but is responsive to voice command.  Follows Simple commands.  Based on this improvement in clinical CNS status, plan was to delay potential cooling protocol.  Bryan Lemma, MD

## 2019-06-28 NOTE — Code Documentation (Addendum)
Unresponsive and pulseless upon arrival to ED room. CPR in progress. Dr. Fanny Bien and Christiane Ha, PA at bedside

## 2019-06-29 ENCOUNTER — Inpatient Hospital Stay (HOSPITAL_COMMUNITY): Payer: Medicare PPO

## 2019-06-29 ENCOUNTER — Encounter (HOSPITAL_COMMUNITY): Payer: Self-pay | Admitting: Cardiology

## 2019-06-29 DIAGNOSIS — I361 Nonrheumatic tricuspid (valve) insufficiency: Secondary | ICD-10-CM

## 2019-06-29 DIAGNOSIS — R4189 Other symptoms and signs involving cognitive functions and awareness: Secondary | ICD-10-CM

## 2019-06-29 DIAGNOSIS — I2129 ST elevation (STEMI) myocardial infarction involving other sites: Secondary | ICD-10-CM

## 2019-06-29 DIAGNOSIS — I4821 Permanent atrial fibrillation: Secondary | ICD-10-CM

## 2019-06-29 DIAGNOSIS — I340 Nonrheumatic mitral (valve) insufficiency: Secondary | ICD-10-CM

## 2019-06-29 LAB — BASIC METABOLIC PANEL
Anion gap: 12 (ref 5–15)
BUN: 18 mg/dL (ref 8–23)
CO2: 20 mmol/L — ABNORMAL LOW (ref 22–32)
Calcium: 8.3 mg/dL — ABNORMAL LOW (ref 8.9–10.3)
Chloride: 109 mmol/L (ref 98–111)
Creatinine, Ser: 1.18 mg/dL — ABNORMAL HIGH (ref 0.44–1.00)
GFR calc Af Amer: 48 mL/min — ABNORMAL LOW (ref >60)
GFR calc non Af Amer: 42 mL/min — ABNORMAL LOW (ref >60)
Glucose, Bld: 120 mg/dL — ABNORMAL HIGH (ref 70–99)
Potassium: 4.5 mmol/L (ref 3.5–5.1)
Sodium: 141 mmol/L (ref 135–145)

## 2019-06-29 LAB — GLUCOSE, CAPILLARY
Glucose-Capillary: 102 mg/dL — ABNORMAL HIGH (ref 70–99)
Glucose-Capillary: 125 mg/dL — ABNORMAL HIGH (ref 70–99)
Glucose-Capillary: 93 mg/dL (ref 70–99)
Glucose-Capillary: 93 mg/dL (ref 70–99)
Glucose-Capillary: 94 mg/dL (ref 70–99)

## 2019-06-29 LAB — COMPREHENSIVE METABOLIC PANEL
ALT: 29 U/L (ref 0–44)
AST: 70 U/L — ABNORMAL HIGH (ref 15–41)
Albumin: 2.9 g/dL — ABNORMAL LOW (ref 3.5–5.0)
Alkaline Phosphatase: 71 U/L (ref 38–126)
Anion gap: 10 (ref 5–15)
BUN: 18 mg/dL (ref 8–23)
CO2: 23 mmol/L (ref 22–32)
Calcium: 8.4 mg/dL — ABNORMAL LOW (ref 8.9–10.3)
Chloride: 109 mmol/L (ref 98–111)
Creatinine, Ser: 1.24 mg/dL — ABNORMAL HIGH (ref 0.44–1.00)
GFR calc Af Amer: 46 mL/min — ABNORMAL LOW (ref >60)
GFR calc non Af Amer: 39 mL/min — ABNORMAL LOW (ref >60)
Glucose, Bld: 115 mg/dL — ABNORMAL HIGH (ref 70–99)
Potassium: 4.4 mmol/L (ref 3.5–5.1)
Sodium: 142 mmol/L (ref 135–145)
Total Bilirubin: 0.8 mg/dL (ref 0.3–1.2)
Total Protein: 5.4 g/dL — ABNORMAL LOW (ref 6.5–8.1)

## 2019-06-29 LAB — CBC
HCT: 35.8 % — ABNORMAL LOW (ref 36.0–46.0)
Hemoglobin: 10.9 g/dL — ABNORMAL LOW (ref 12.0–15.0)
MCH: 29.9 pg (ref 26.0–34.0)
MCHC: 30.4 g/dL (ref 30.0–36.0)
MCV: 98.4 fL (ref 80.0–100.0)
Platelets: 338 10*3/uL (ref 150–400)
RBC: 3.64 MIL/uL — ABNORMAL LOW (ref 3.87–5.11)
RDW: 14.2 % (ref 11.5–15.5)
WBC: 10.9 10*3/uL — ABNORMAL HIGH (ref 4.0–10.5)
nRBC: 0 % (ref 0.0–0.2)

## 2019-06-29 LAB — TSH: TSH: 1.013 u[IU]/mL (ref 0.350–4.500)

## 2019-06-29 LAB — ECHOCARDIOGRAM COMPLETE
Height: 64 in
Weight: 2515.01 oz

## 2019-06-29 LAB — HEPARIN LEVEL (UNFRACTIONATED): Heparin Unfractionated: 0.3 IU/mL (ref 0.30–0.70)

## 2019-06-29 MED ORDER — ACETAMINOPHEN 10 MG/ML IV SOLN: 1000.0000 mg | Freq: Four times a day (QID) | INTRAVENOUS | Status: DC

## 2019-06-29 MED ORDER — ASPIRIN 300 MG RE SUPP
150.0000 mg | Freq: Every day | RECTAL | Status: DC
  Filled 2019-06-29: qty 1

## 2019-06-29 MED ORDER — ATORVASTATIN CALCIUM 80 MG PO TABS: 80.0000 mg | ORAL_TABLET | Freq: Every day | ORAL | Status: DC

## 2019-06-29 MED ORDER — SODIUM CHLORIDE 0.9 % IV SOLN
2000.0000 mg | Freq: Once | INTRAVENOUS | Status: AC
  Administered 2019-06-29: 2000 mg via INTRAVENOUS
  Filled 2019-06-29: qty 20

## 2019-06-29 MED ORDER — SODIUM CHLORIDE 0.9 % IV SOLN: 250.0000 mL | INTRAVENOUS | Status: DC | PRN

## 2019-06-29 MED ORDER — CHLORHEXIDINE GLUCONATE 0.12 % MT SOLN
15.0000 mL | Freq: Two times a day (BID) | OROMUCOSAL | Status: DC
  Administered 2019-06-29 – 2019-07-04 (×10): 15 mL via OROMUCOSAL
  Filled 2019-06-29 (×10): qty 15

## 2019-06-29 MED ORDER — SODIUM CHLORIDE 0.9 % IV SOLN: INTRAVENOUS | Status: AC

## 2019-06-29 MED ORDER — LEVOTHYROXINE SODIUM 88 MCG PO TABS
88.0000 ug | ORAL_TABLET | Freq: Every day | ORAL | Status: DC
  Administered 2019-06-30: 88 ug
  Filled 2019-06-29: qty 1

## 2019-06-29 MED ORDER — ASPIRIN 300 MG RE SUPP: 150.0000 mg | Freq: Every day | RECTAL | Status: DC

## 2019-06-29 MED ORDER — CLOPIDOGREL BISULFATE 75 MG PO TABS: 75.0000 mg | ORAL_TABLET | Freq: Every day | ORAL | Status: DC

## 2019-06-29 MED ORDER — ASPIRIN 81 MG PO CHEW: 81.0000 mg | CHEWABLE_TABLET | Freq: Every day | ORAL | Status: DC

## 2019-06-29 MED ORDER — ORAL CARE MOUTH RINSE
15.0000 mL | Freq: Two times a day (BID) | OROMUCOSAL | Status: DC
  Administered 2019-07-01 – 2019-07-03 (×3): 15 mL via OROMUCOSAL

## 2019-06-29 MED ORDER — LEVETIRACETAM IN NACL 500 MG/100ML IV SOLN
500.0000 mg | Freq: Two times a day (BID) | INTRAVENOUS | Status: DC
  Administered 2019-06-29 – 2019-06-30 (×2): 500 mg via INTRAVENOUS
  Filled 2019-06-29 (×3): qty 100

## 2019-06-29 MED ORDER — PANTOPRAZOLE SODIUM 40 MG PO PACK
40.0000 mg | PACK | Freq: Every day | ORAL | Status: DC
  Administered 2019-06-29: 40 mg
  Filled 2019-06-29: qty 20

## 2019-06-29 MED ORDER — ASPIRIN 81 MG PO CHEW
81.0000 mg | CHEWABLE_TABLET | Freq: Every day | ORAL | Status: DC
  Administered 2019-06-29 – 2019-06-30 (×2): 81 mg
  Filled 2019-06-29 (×2): qty 1

## 2019-06-29 MED ORDER — SODIUM CHLORIDE 0.9% FLUSH
3.0000 mL | Freq: Two times a day (BID) | INTRAVENOUS | Status: DC
  Administered 2019-06-29 – 2019-06-30 (×2): 3 mL via INTRAVENOUS

## 2019-06-29 MED ORDER — DABIGATRAN ETEXILATE MESYLATE 150 MG PO CAPS: 150.0000 mg | ORAL_CAPSULE | Freq: Two times a day (BID) | ORAL | Status: DC

## 2019-06-29 MED ORDER — ATORVASTATIN CALCIUM 80 MG PO TABS
80.0000 mg | ORAL_TABLET | Freq: Every day | ORAL | Status: DC
  Administered 2019-06-29 – 2019-06-30 (×2): 80 mg
  Filled 2019-06-29 (×2): qty 1

## 2019-06-29 MED ORDER — ACETAMINOPHEN 160 MG/5ML PO SOLN
650.0000 mg | Freq: Four times a day (QID) | ORAL | Status: DC | PRN
  Administered 2019-06-30 (×2): 650 mg
  Filled 2019-06-29 (×3): qty 20.3

## 2019-06-29 MED ORDER — SODIUM CHLORIDE 0.9% FLUSH: 3.0000 mL | INTRAVENOUS | Status: DC | PRN

## 2019-06-29 MED ORDER — IOHEXOL 350 MG/ML SOLN
60.0000 mL | Freq: Once | INTRAVENOUS | Status: AC | PRN
  Administered 2019-06-29: 60 mL via INTRAVENOUS

## 2019-06-29 MED ORDER — ACETAMINOPHEN 10 MG/ML IV SOLN
1000.0000 mg | Freq: Once | INTRAVENOUS | Status: AC
  Administered 2019-06-29: 1000 mg via INTRAVENOUS
  Filled 2019-06-29: qty 100

## 2019-06-29 MED ORDER — CLOPIDOGREL BISULFATE 75 MG PO TABS
75.0000 mg | ORAL_TABLET | Freq: Every day | ORAL | Status: DC
  Administered 2019-06-29 – 2019-07-01 (×3): 75 mg
  Filled 2019-06-29 (×3): qty 1

## 2019-06-29 MED ORDER — HEPARIN (PORCINE) 25000 UT/250ML-% IV SOLN
900.0000 [IU]/h | INTRAVENOUS | Status: DC
  Administered 2019-06-29 – 2019-06-30 (×2): 800 [IU]/h via INTRAVENOUS
  Filled 2019-06-29: qty 250

## 2019-06-29 MED ORDER — SODIUM CHLORIDE 0.9 % IV SOLN: INTRAVENOUS | Status: DC

## 2019-06-29 MED ORDER — LORAZEPAM 2 MG/ML IJ SOLN
INTRAMUSCULAR | Status: AC
  Administered 2019-06-29: 2 mg
  Filled 2019-06-29: qty 1

## 2019-06-29 NOTE — Code Documentation (Signed)
Code Stroke   I received a call from [facility] staff with regards on activating a Code Stroke. I was with another patient in an emergency. Dr. Roda Shutters responded to the stroke and saw the patient in 2H. Patient was brought down to CT and I came at 1005 to see what the plan of care of patient will be. CTA was being done. No LVO seen, Code Stroke is cancelled 1036  Plan: -- EEG -- Keppra ? -- MRI  -- See MD note

## 2019-06-29 NOTE — Consult Note (Addendum)
Stroke Neurology Consultation Note  Consult Requested by: Dr. Tonia Brooms  Reason for Consult: code stroke  Consult Date: 06/29/19  The history was obtained from the RN and Dr. Tonia Brooms.  During history and examination, all items were not able to obtain unless otherwise noted.  History of Present Illness:  Courtney Chan is a 86 y.o. Caucasian female with PMH of afib, HLD, CKD admitted after CP and V. fib arrest in the ED with approximately 5 minutes ACLS and defibrillation and ROSC. She was found to have NSTEMI and had PCI yesterday and on heparin IV since. This morning, she was extubated and tolerating well. She was able to follow all simple commands, moving symmetrically, and talking with normal conversation with Dr. Tonia Brooms. At around 9:25am, she was found to have acute AMS, unresponsive, eyes closed, not following commands and nonverbal, loss of consciousness. LSW was 9:15am. Heparin was discontinued. And neurology was called for code stroke.  On exam, pt eyes closed, pupils equal, eyes mid position, no nystagmus, on voice and tactile stimulation, pt open eyes, eyes rolling bilaterally, not following commands, not blinking to visual threat. On pain, moving all extremities equally. No focal deficit seen. Initially nuchal rigidity, but after 2 mg of Ativan, neck supple. No fever. Stat CT, CTA head and neck ordered.   LSN: 9:15am tPA Given: No: on heparin IV, non focal exam  Past Medical History:  Diagnosis Date  . Atrial fibrillation (HCC) 2011  . Hyperlipidemia   . Hypertension   . Stroke (HCC)   . Thyroid disease     Past Surgical History:  Procedure Laterality Date  . ABDOMINAL HYSTERECTOMY    . BREAST BIOPSY Left 1980   Negative    Family History  Problem Relation Age of Onset  . Breast cancer Maternal Grandmother        45's    Social History:  reports that she has never smoked. She has never used smokeless tobacco. She reports that she does not drink alcohol or use  drugs.  Allergies:  Allergies  Allergen Reactions  . Metronidazole Diarrhea and Nausea And Vomiting  . Oxycodone Other (See Comments)    Altered mental status    No current facility-administered medications on file prior to encounter.   Current Outpatient Medications on File Prior to Encounter  Medication Sig Dispense Refill  . levothyroxine (SYNTHROID, LEVOTHROID) 88 MCG tablet Take 88 mcg by mouth daily.       Review of Systems: A full ROS was attempted today and was not able to be performed.    Physical Examination: Temp:  [97 F (36.1 C)-99.3 F (37.4 C)] 99.1 F (37.3 C) (02/15 0837) Pulse Rate:  [43-136] 114 (02/15 0851) Resp:  [10-27] 18 (02/15 0851) BP: (78-139)/(45-98) 110/71 (02/15 0851) SpO2:  [93 %-100 %] 94 % (02/15 0851) FiO2 (%):  [40 %-100 %] 40 % (02/15 0802) Weight:  [68 kg-71.3 kg] 71.3 kg (02/14 1948)  General - well nourished, well developed, obtunded.    Ophthalmologic - fundi not visualized due to noncooperation.    Cardiovascular - irregularly irregular heart rate and rhythm.  Neuro - eyes closed, obtunded, not following commands, non verbal. Pupils 2.104mm bilaterally, sluggish to light. With voice stimulation, pt open eyes, eyes rolling to bilateral directions, moving BUE 3-/5 and BLE 2/5 spontaneously and with pain stimulation. Neck supple after ativan 2mg . No significant facial droop, not blinking to visual threat bilaterally. No babinski bilaterally. Sensation, coordination and gait not tested.  NIH Stroke Scale  Level Of Consciousness 0=Alert; keenly responsive 1=Arouse to minor stimulation 2=Requires repeated stimulation to arouse or movements to pain 3=postures or unresponsive 3  LOC Questions to Month and Age 33=Answers both questions correctly 1=Answers one question correctly or dysarthria/intubated/trauma/language barrier 2=Answers neither question correctly or aphasia 2  LOC Commands      -Open/Close eyes     -Open/close grip      -Pantomime commands if communication barrier 0=Performs both tasks correctly 1=Performs one task correctly 2=Performs neighter task correctly 2  Best Gaze     -Only assess horizontal gaze 0=Normal 1=Partial gaze palsy 2=Forced deviation, or total gaze paresis 0  Visual 0=No visual loss 1=Partial hemianopia 2=Complete hemianopia 3=Bilateral hemianopia (blind including cortical blindness) 3  Facial Palsy     -Use grimace if obtunded 0=Normal symmetrical movement 1=Minor paralysis (asymmetry) 2=Partial paralysis (lower face) 3=Complete paralysis (upper and lower face) 0  Motor  0=No drift for 10/5 seconds 1=Drift, but does not hit bed 2=Some antigravity effort, hits  bed 3=No effort against gravity, limb falls 4=No movement 0=Amputation/joint fusion Right Arm 2     Leg 3    Left Arm 2     Leg 3  Limb Ataxia     - FNT/HTS 0=Absent or does not understand or paralyzed or amputation/joint fusion 1=Present in one limb 2=Present in two limbs 0  Sensory 0=Normal 1=Mild to moderate sensory loss 2=Severe to total sensory loss or coma/unresponsive 1  Best Language 0=No aphasia, normal 1=Mild to moderate aphasia 2=Severe aphasia 3=Mute, global aphasia, or coma/unresponsive 3  Dysarthria 0=Normal 1=Mild to moderate 2=Severe, unintelligible or mute/anarthric 0=intubated/unable to test 2  Extinction/Neglect 0=No abnormality 1=visual/tactile/auditory/spatia/personal inattention/Extinction to bilateral simultaneous stimulation 2=Profound neglect/extinction more than 1 modality  2  Total   28     Data Reviewed: DG Chest 2 View  Result Date: 06/28/2019 CLINICAL DATA:  Chest pain shortness of breath EXAM: CHEST - 2 VIEW COMPARISON:  August 25, 2017 FINDINGS: The mediastinal contour and cardiac silhouette are normal. Mild opacity of left lung base is identified at least in part due to atelectasis but superimposed early pneumonia is not excluded. There is no pulmonary edema or pleural  effusion. The bony structures are stable. IMPRESSION: Mild opacity of left lung base at least in part due to atelectasis but superimposed early pneumonia is not excluded. Electronically Signed   By: Sherian Rein M.D.   On: 06/28/2019 14:41   CT Head Wo Contrast  Result Date: 06/28/2019 CLINICAL DATA:  Cardiac arrest, unresponsive and pulseless upon arrival to ED, intubated EXAM: CT HEAD WITHOUT CONTRAST TECHNIQUE: Contiguous axial images were obtained from the base of the skull through the vertex without intravenous contrast. COMPARISON:  06/28/2019 FINDINGS: Brain: Chronic small vessel ischemic changes are seen within the bilateral frontal periventricular white matter and left external capsule. No acute infarct or hemorrhage. Lateral ventricles and remaining midline structures are unremarkable. There are no acute extra-axial fluid collections. There is no mass effect. Vascular: No hyperdense vessel or unexpected calcification. Skull: Normal. Negative for fracture or focal lesion. Sinuses/Orbits: No acute finding. Other: None IMPRESSION: 1. Stable head CT, no acute intracranial process. Electronically Signed   By: Sharlet Salina M.D.   On: 06/28/2019 15:57   CARDIAC CATHETERIZATION  Result Date: 06/28/2019  Previously placed Prox Cx to Mid Cx stent (unknown type) is widely patent -the lesion began just distal to the stent  CULPRIT LESION: Mid Cx lesion is 95% stenosed.  A drug-eluting stent was successfully placed in  order to overlap the previously placed stent, using a STENT RESOLUTE ONYX 2.0X15. Postdilated to 2.3 mm - 2.1 mm  Post intervention, there is a 0% residual stenosis.  -----------------------------------  Ost RCA to Prox RCA lesion is 30% stenosed. Previously placed Prox RCA to Mid RCA stent (unknown type) is widely patent.  Mid LAD lesion is 30% stenosed.  -----------------------------------  There is moderate to severe left ventricular systolic dysfunction. The Left Ventricular  Ejection Fraction is 25-35% by visual estimate.  LV end diastolic pressure is moderately elevated. There is mild (2+) mitral regurgitation.  SUMMARY  Severe single-vessel CAD with 95% stenosis in mid-distal LCx beyond previously placed stent  Successful DES PCI of LCx with stent overlapping proximal stent (resolute Onyx DES 2.0 mm x 15 mm--2.3 mm)  Otherwise mild to moderate 30% ostial and proximal RCA and mid LAD stenosis.  ACUTE COMBINED SYSTOLIC AND DIASTOLIC HEART FAILURE: Severely reduced LVEF with Takotsubo pattern, EF estimated 25 to 30%; Moderately elevated LVEDP of 20 mmHg  Persistent atrial fibrillation, rates notably improved following PCI  Borderline hypotension with improved blood pressures following PCI-no evidence of Cardiogenic Shock  Resolution of Right Bundle Branch Block  Evidence of neurologic recovery with the patient opening eyes and following commands while intubated. ->  Follows finger with eyes, squeezes hands. RECOMMENDATIONS  Based on bed availability, the patient will be transferred to [facility]ICU (2 Heart).  With A. fib, will start IV heparin 8 hours after sheath removal and convert back to Pradaxa in the morning  Have ordered 2D echocardiogram, suspect reduced EF is partly related to stress cardiomyopathy from cardiac arrest as the reduced EF is well at proportion with her coronary disease.  Unclear if the patient has permanent A. fib versus paroxysmal, did not convert with defibrillation -> was on digoxin and high-dose metoprolol prior to arrival, neither have been ordered until the patient's blood pressure stabilized, would probably start with low-dose metoprolol in the morning.  Have not ordered other antihypertensives due to borderline blood pressures. The patient will be cared for under the [facility] HEART CARE service and upon discharge will be returned to the care of Dr. Jamse Mead. Bryan Lemma, MD  DG CHEST PORT 1 VIEW  Result Date:  06/28/2019 CLINICAL DATA:  Post cardiac arrest earlier today. EXAM: PORTABLE CHEST 1 VIEW COMPARISON:  Chest x-rays from earlier same day. FINDINGS: Endotracheal tube is well positioned with tip just above the level of the carina. Enteric tube passes below the diaphragm. Pacer pads overlie the heart. There is persistent central pulmonary vascular congestion and RIGHT suprahilar edema. Probable atelectasis at the LEFT lung base. No pleural effusion or pneumothorax is seen. IMPRESSION: 1. No significant change compared to today's earlier chest x-ray. Persistent central pulmonary vascular congestion and RIGHT suprahilar pulmonary edema. No pleural effusion or pneumothorax. 2. Endotracheal tube well positioned with tip just above the level of the carina. Electronically Signed   By: Bary Richard M.D.   On: 06/28/2019 20:53   DG Chest Portable 1 View  Result Date: 06/28/2019 CLINICAL DATA:  Status post arrest, intubated EXAM: PORTABLE CHEST 1 VIEW COMPARISON:  06/28/2019 FINDINGS: Single frontal view of the chest demonstrates defibrillator pads overlying left chest. Endotracheal tube overlies tracheal air column, tip just below thoracic inlet. Enteric catheter passes below diaphragm, tip excluded by collimation. Numerous cardiac leads overlie the chest. Cardiac silhouette is stable. Since the prior exam, diffuse interstitial and ground-glass opacities have developed consistent with pulmonary edema. Small left  pleural effusion is noted. There is no pneumothorax on this supine exam. No acute displaced fractures. IMPRESSION: 1. Support devices as above.  No evidence of malpositioning. 2. Interval development of mild pulmonary edema. Electronically Signed   By: Randa Ngo M.D.   On: 06/28/2019 15:13   DG Abd Portable 1 View  Result Date: 06/28/2019 CLINICAL DATA:  Intubated, enteric catheter placement, arrest EXAM: PORTABLE ABDOMEN - 1 VIEW COMPARISON:  None. FINDINGS: Frontal view of the lower chest and upper  abdomen demonstrates enteric catheter tip and side port projecting over gastric body. Numerous cardiac leads are identified. Defibrillator pads overlie the left chest. Bowel gas pattern is unremarkable. Interstitial and ground-glass opacities and small left pleural effusion consistent with pulmonary edema. IMPRESSION: 1. Enteric catheter overlying gastric body. 2. Pulmonary edema. Electronically Signed   By: Randa Ngo M.D.   On: 06/28/2019 15:14    Assessment: 86 y.o. female with PMH of afib, HLD, CKD admitted for cardiac arrest and NSTEMI s/p PCI on heparin IV. Had acute onset AMS, unresponsiveness. However, no focal neuro deficit on exam. With stimulation, pt present with mild agitation. Concerning for seizure activity, ativan 2mg  IV given. Heparin stopped and stat CT showed no hemorrhage. CTA head and neck pending. If no LVO, will consider stat EEG and load with keppra as well as MRI.   Stroke Risk Factors - atrial fibrillation, hyperlipidemia, hypertension and cardia arrest  Plan: - pending CTA head and neck to rule out LVO - if no LVO, will do stat EEG, load with keppra - MRI brain - heparin on hold now - OK to continue ASA and plavix  - may consider NG tube for medication and nutrition.  - will follow  Thank you for this consultation and allowing [place] to participate in the care of this patient. Case discussed with Dr. Valeta Harms and cardiology team  Rosalin Hawking, MD PhD Stroke Neurology 06/29/2019 10:10 AM

## 2019-06-29 NOTE — Progress Notes (Signed)
ANTICOAGULATION CONSULT NOTE  Consult  Pharmacy Consult for Heparin  Indication: Atrial fibrillation  Allergies  Allergen Reactions  . Metronidazole Diarrhea and Nausea And Vomiting  . Oxycodone Other (See Comments)    Altered mental status    Patient Measurements: Height: 5\' 4"  (162.6 cm) Weight: 157 lb 3 oz (71.3 kg) IBW/kg (Calculated) : 54.7 Heparin Dosing Weight: 68 kg   Vital Signs: Temp: 99.1 F (37.3 C) (02/15 0837) Temp Source: Oral (02/15 0837) BP: 110/71 (02/15 0851) Pulse Rate: 114 (02/15 0851)  Labs: Recent Labs    06/28/19 1302 06/28/19 1302 06/28/19 1522 06/28/19 2051 06/29/19 0550  HGB 12.7   < >  --  11.2* 10.9*  HCT 40.3  --   --  33.0* 35.8*  PLT 386  --   --   --  338  APTT  --   --  66*  --   --   LABPROT  --   --  25.2*  --   --   INR  --   --  2.3*  --   --   CREATININE 1.05*  --   --   --  1.18*  TROPONINIHS 11,576*  --  13,408*  --   --    < > = values in this interval not displayed.    Estimated Creatinine Clearance: 33.1 mL/min (A) (by C-G formula based on SCr of 1.18 mg/dL (H)).  Assessment: 86 yo female with h/o Afib s/p LCx PCI 2/14, Pradaxa on hold.  CVA/head bleed ruled out this am after having altered mental status post extubation. Will resume heparin, plavix, and aspirin today. Hold pradaxa for now, with plans to resume prior to discharge. May need 2b3a inhibitor if unable to take po's today.  Goal of Therapy:  Heparin level 0.3-0.7 units/ml Monitor platelets by anticoagulation protocol: Yes   Plan:  Restart heparin 800 units/hr was turned off for CT scan Check heparin level in 8 hours.  3/14 PharmD., BCPS Clinical Pharmacist 06/29/2019 10:43 AM

## 2019-06-29 NOTE — Progress Notes (Signed)
Code status and patients clinical status was discussed with family by cardiology team. Family would like full code for now. If she deteriorates, family should be notified and decide further steps.  Patient mental status has not been changed.  Assessment and plan:  86 y.o. female  history of dyslipidemia atrial fibrillation HTN, CKD, Stroke, hypothyroidism and prior DNR code, who presented with lateral STEMI complicated with v fib and cardiac arrest in ED. S/p ACLS, ROSC and emergent cath and PCI, DES on Circumflex artery. She was intubated post ROSC until this AM. She got extubated later this AM and then became altered. Neurology evaluated patient. No acute ischemic stroke on CT head. MRI is pending. Performing EEG.  Will place NG tube to administer DUPT and statin. Patient will be full code for now but family will be updated if critical situation happens to discuss plan and goal of care.  -Full code for now -NG tube to continue ASA and to start high intensity statin and Plavix -If NG tube placement will be unsuccessful, will add IV antiplatelet such as Aggrastat  -Continue IV Heparin -Not on BB in setting of hypotension -Continue gentle IV fluid for 6 hours and measure urine output -Neurology and PCCM are following. Appreciate recommendations

## 2019-06-29 NOTE — Progress Notes (Signed)
EEG completed, results pending. 

## 2019-06-29 NOTE — Progress Notes (Addendum)
Progress Note  Patient Name: Courtney Chan Date of Encounter: 06/29/2019  Primary Cardiologist: No primary care provider on file.   Patient profile   Subjective   Patient was intubated awake and alert, when saw her earlier this morning, followed commands. She tries to communicate and shows that ET tube is bothering her. RN is in there room, mentioning that they are trying to do a weaning trial again in few minutes.  ADDENDUM: Patient extubated, but became altered. Has spontaneous breathing but O2 sat remained >92%. Neurology consulted per PCCM to evaluate for stroke/seizure.  Inpatient Medications    Scheduled Meds: . aspirin  81 mg Oral Daily   Or  . aspirin  150 mg Rectal Daily  . atorvastatin  80 mg Oral q1800  . chlorhexidine gluconate (MEDLINE KIT)  15 mL Mouth Rinse BID  . Chlorhexidine Gluconate Cloth  6 each Topical Daily  . clopidogrel  75 mg Oral Daily  . fentaNYL (SUBLIMAZE) injection  25 mcg Intravenous Once  . mouth rinse  15 mL Mouth Rinse 10 times per day  . pantoprazole (PROTONIX) IV  40 mg Intravenous Q24H  . sodium chloride flush  3 mL Intravenous Q12H   Continuous Infusions: . sodium chloride    . sodium chloride    . fentaNYL infusion INTRAVENOUS 25 mcg/hr (06/29/19 0800)  . levETIRAcetam    . levETIRAcetam     PRN Meds: sodium chloride, fentaNYL, sodium chloride flush   Vital Signs    Vitals:   06/29/19 0800 06/29/19 0802 06/29/19 0837 06/29/19 0851  BP: (!) 101/59   110/71  Pulse: 100   (!) 114  Resp: 10   18  Temp:   99.1 F (37.3 C)   TempSrc:   Oral   SpO2: 98% 100%  94%  Weight:      Height:        Intake/Output Summary (Last 24 hours) at 06/29/2019 1037 Last data filed at 06/29/2019 0800 Gross per 24 hour  Intake 169.55 ml  Output 435 ml  Net -265.45 ml   Last 3 Weights 06/28/2019 06/28/2019 02/18/2019  Weight (lbs) 157 lb 3 oz 150 lb 150 lb  Weight (kg) 71.3 kg 68.04 kg 68.04 kg      Telemetry    Afib- Personally  Reviewed  ECG    Afib - Personally Reviewed  Physical Exam  Physical exam on evaluation before extubation: GEN: Intubated on ventilator with ORVC mode. No acute distress. She is alert and follows commands  Neck: No JVD Cardiac: RRR, no murmurs, rubs, or gallops.  Respiratory: Clear to auscultation bilaterally. GI: Soft, nontender, non-distended  MS: No edema; No deformity. Neuro:  Nonfocal   Labs    High Sensitivity Troponin:   Recent Labs  Lab 06/28/19 1302 06/28/19 1522  TROPONINIHS 11,576* 13,408*      Chemistry Recent Labs  Lab 06/28/19 1302 06/28/19 2051 06/29/19 0550  NA 141 140 141  K 4.4 4.9 4.5  CL 108  --  109  CO2 24  --  20*  GLUCOSE 91  --  120*  BUN 19  --  18  CREATININE 1.05*  --  1.18*  CALCIUM 9.2  --  8.3*  GFRNONAA 48*  --  42*  GFRAA 56*  --  48*  ANIONGAP 9  --  12     Hematology Recent Labs  Lab 06/28/19 1302 06/28/19 2051 06/29/19 0550  WBC 8.0  --  10.9*  RBC 4.25  --  3.64*  HGB 12.7 11.2* 10.9*  HCT 40.3 33.0* 35.8*  MCV 94.8  --  98.4  MCH 29.9  --  29.9  MCHC 31.5  --  30.4  RDW 13.8  --  14.2  PLT 386  --  338    BNPNo results for input(s): BNP, PROBNP in the last 168 hours.   DDimer No results for input(s): DDIMER in the last 168 hours.   Radiology    CT ANGIO HEAD W OR WO CONTRAST  Result Date: 06/29/2019 CLINICAL DATA:  Stroke follow-up EXAM: CT ANGIOGRAPHY HEAD AND NECK TECHNIQUE: Multidetector CT imaging of the head and neck was performed using the standard protocol during bolus administration of intravenous contrast. Multiplanar CT image reconstructions and MIPs were obtained to evaluate the vascular anatomy. Carotid stenosis measurements (when applicable) are obtained utilizing NASCET criteria, using the distal internal carotid diameter as the denominator. CONTRAST:  Dose is currently not available COMPARISON:  None. FINDINGS: CTA NECK FINDINGS Aortic arch: Atherosclerotic plaque.  Two vessel branching. Right  carotid system: Mild to moderate mainly calcified plaque at the bifurcation/bulb. No flow limiting stenosis or ulceration. Left carotid system: Atheromatous plaque of the distal common carotid which is mild for age. No flow limiting stenosis or ulceration. Vertebral arteries: Proximal subclavian atherosclerosis on the left more than right without flow limiting stenosis or ulceration. Codominant vertebral arteries that show robust flow to the dura. Skeleton: Degenerative changes without acute or aggressive finding. Other neck: No evidence of mass or inflammation Upper chest: Ground-glass and streaky opacity in the right more than left lung. No opacification at the left atrial appendage. Review of the MIP images confirms the above findings CTA HEAD FINDINGS Anterior circulation: Atherosclerotic plaque on the carotid siphons. No branch occlusion or flow limiting stenosis. Negative for aneurysm Posterior circulation: Vertebral and basilar arteries are smooth and widely patent. No branch occlusion or aneurysm. Venous sinuses: Unremarkable Anatomic variants: None significant Review of the MIP images confirms the above findings IMPRESSION: 1. No emergent vascular finding 2. Atherosclerosis without flow limiting stenosis or ulceration in the head and neck. 3. Non opacified left atrial appendage in the setting of atrial fibrillation that could be delayed filling versus thrombus. 4. Right more than left pneumonia and atelectasis. Electronically Signed   By: Monte Fantasia M.D.   On: 06/29/2019 10:29   DG Chest 2 View  Result Date: 06/28/2019 CLINICAL DATA:  Chest pain shortness of breath EXAM: CHEST - 2 VIEW COMPARISON:  August 25, 2017 FINDINGS: The mediastinal contour and cardiac silhouette are normal. Mild opacity of left lung base is identified at least in part due to atelectasis but superimposed early pneumonia is not excluded. There is no pulmonary edema or pleural effusion. The bony structures are stable.  IMPRESSION: Mild opacity of left lung base at least in part due to atelectasis but superimposed early pneumonia is not excluded. Electronically Signed   By: Abelardo Diesel M.D.   On: 06/28/2019 14:41   CT Head Wo Contrast  Result Date: 06/28/2019 CLINICAL DATA:  Cardiac arrest, unresponsive and pulseless upon arrival to ED, intubated EXAM: CT HEAD WITHOUT CONTRAST TECHNIQUE: Contiguous axial images were obtained from the base of the skull through the vertex without intravenous contrast. COMPARISON:  06/28/2019 FINDINGS: Brain: Chronic small vessel ischemic changes are seen within the bilateral frontal periventricular white matter and left external capsule. No acute infarct or hemorrhage. Lateral ventricles and remaining midline structures are unremarkable. There are no acute extra-axial fluid collections. There is  no mass effect. Vascular: No hyperdense vessel or unexpected calcification. Skull: Normal. Negative for fracture or focal lesion. Sinuses/Orbits: No acute finding. Other: None IMPRESSION: 1. Stable head CT, no acute intracranial process. Electronically Signed   By: Randa Ngo M.D.   On: 06/28/2019 15:57   CT ANGIO NECK W OR WO CONTRAST  Result Date: 06/29/2019 CLINICAL DATA:  Stroke follow-up EXAM: CT ANGIOGRAPHY HEAD AND NECK TECHNIQUE: Multidetector CT imaging of the head and neck was performed using the standard protocol during bolus administration of intravenous contrast. Multiplanar CT image reconstructions and MIPs were obtained to evaluate the vascular anatomy. Carotid stenosis measurements (when applicable) are obtained utilizing NASCET criteria, using the distal internal carotid diameter as the denominator. CONTRAST:  Dose is currently not available COMPARISON:  None. FINDINGS: CTA NECK FINDINGS Aortic arch: Atherosclerotic plaque.  Two vessel branching. Right carotid system: Mild to moderate mainly calcified plaque at the bifurcation/bulb. No flow limiting stenosis or ulceration. Left  carotid system: Atheromatous plaque of the distal common carotid which is mild for age. No flow limiting stenosis or ulceration. Vertebral arteries: Proximal subclavian atherosclerosis on the left more than right without flow limiting stenosis or ulceration. Codominant vertebral arteries that show robust flow to the dura. Skeleton: Degenerative changes without acute or aggressive finding. Other neck: No evidence of mass or inflammation Upper chest: Ground-glass and streaky opacity in the right more than left lung. No opacification at the left atrial appendage. Review of the MIP images confirms the above findings CTA HEAD FINDINGS Anterior circulation: Atherosclerotic plaque on the carotid siphons. No branch occlusion or flow limiting stenosis. Negative for aneurysm Posterior circulation: Vertebral and basilar arteries are smooth and widely patent. No branch occlusion or aneurysm. Venous sinuses: Unremarkable Anatomic variants: None significant Review of the MIP images confirms the above findings IMPRESSION: 1. No emergent vascular finding 2. Atherosclerosis without flow limiting stenosis or ulceration in the head and neck. 3. Non opacified left atrial appendage in the setting of atrial fibrillation that could be delayed filling versus thrombus. 4. Right more than left pneumonia and atelectasis. Electronically Signed   By: Monte Fantasia M.D.   On: 06/29/2019 10:29   CARDIAC CATHETERIZATION  Result Date: 06/28/2019  Previously placed Prox Cx to Mid Cx stent (unknown type) is widely patent -the lesion began just distal to the stent  CULPRIT LESION: Mid Cx lesion is 95% stenosed.  A drug-eluting stent was successfully placed in order to overlap the previously placed stent, using a STENT RESOLUTE ONYX 2.0X15. Postdilated to 2.3 mm - 2.1 mm  Post intervention, there is a 0% residual stenosis.  -----------------------------------  Ost RCA to Prox RCA lesion is 30% stenosed. Previously placed Prox RCA to Mid RCA  stent (unknown type) is widely patent.  Mid LAD lesion is 30% stenosed.  -----------------------------------  There is moderate to severe left ventricular systolic dysfunction. The Left Ventricular Ejection Fraction is 25-35% by visual estimate.  LV end diastolic pressure is moderately elevated. There is mild (2+) mitral regurgitation.  SUMMARY  Severe single-vessel CAD with 95% stenosis in mid-distal LCx beyond previously placed stent  Successful DES PCI of LCx with stent overlapping proximal stent (resolute Onyx DES 2.0 mm x 15 mm--2.3 mm)  Otherwise mild to moderate 30% ostial and proximal RCA and mid LAD stenosis.  ACUTE COMBINED SYSTOLIC AND DIASTOLIC HEART FAILURE: Severely reduced LVEF with Takotsubo pattern, EF estimated 25 to 30%; Moderately elevated LVEDP of 20 mmHg  Persistent atrial fibrillation, rates notably improved following PCI  Borderline hypotension with improved blood pressures following PCI-no evidence of Cardiogenic Shock  Resolution of Right Bundle Branch Block  Evidence of neurologic recovery with the patient opening eyes and following commands while intubated. ->  Follows finger with eyes, squeezes hands. RECOMMENDATIONS  Based on bed availability, the patient will be transferred to [facility] CVICU (2 Heart).  With A. fib, will start IV heparin 8 hours after sheath removal and convert back to Pradaxa in the morning  Have ordered 2D echocardiogram, suspect reduced EF is partly related to stress cardiomyopathy from cardiac arrest as the reduced EF is well at proportion with her coronary disease.  Unclear if the patient has permanent A. fib versus paroxysmal, did not convert with defibrillation -> was on digoxin and high-dose metoprolol prior to arrival, neither have been ordered until the patient's blood pressure stabilized, would probably start with low-dose metoprolol in the morning.  Have not ordered other antihypertensives due to borderline blood pressures. The  patient will be cared for under the [facility] service and upon discharge will be returned to the care of Dr. Miquel Dunn. Glenetta Hew, MD  DG CHEST PORT 1 VIEW  Result Date: 06/28/2019 CLINICAL DATA:  Post cardiac arrest earlier today. EXAM: PORTABLE CHEST 1 VIEW COMPARISON:  Chest x-rays from earlier same day. FINDINGS: Endotracheal tube is well positioned with tip just above the level of the carina. Enteric tube passes below the diaphragm. Pacer pads overlie the heart. There is persistent central pulmonary vascular congestion and RIGHT suprahilar edema. Probable atelectasis at the LEFT lung base. No pleural effusion or pneumothorax is seen. IMPRESSION: 1. No significant change compared to today's earlier chest x-ray. Persistent central pulmonary vascular congestion and RIGHT suprahilar pulmonary edema. No pleural effusion or pneumothorax. 2. Endotracheal tube well positioned with tip just above the level of the carina. Electronically Signed   By: Franki Cabot M.D.   On: 06/28/2019 20:53   DG Chest Portable 1 View  Result Date: 06/28/2019 CLINICAL DATA:  Status post arrest, intubated EXAM: PORTABLE CHEST 1 VIEW COMPARISON:  06/28/2019 FINDINGS: Single frontal view of the chest demonstrates defibrillator pads overlying left chest. Endotracheal tube overlies tracheal air column, tip just below thoracic inlet. Enteric catheter passes below diaphragm, tip excluded by collimation. Numerous cardiac leads overlie the chest. Cardiac silhouette is stable. Since the prior exam, diffuse interstitial and ground-glass opacities have developed consistent with pulmonary edema. Small left pleural effusion is noted. There is no pneumothorax on this supine exam. No acute displaced fractures. IMPRESSION: 1. Support devices as above.  No evidence of malpositioning. 2. Interval development of mild pulmonary edema. Electronically Signed   By: Randa Ngo M.D.   On: 06/28/2019 15:13   DG Abd Portable 1  View  Result Date: 06/28/2019 CLINICAL DATA:  Intubated, enteric catheter placement, arrest EXAM: PORTABLE ABDOMEN - 1 VIEW COMPARISON:  None. FINDINGS: Frontal view of the lower chest and upper abdomen demonstrates enteric catheter tip and side port projecting over gastric body. Numerous cardiac leads are identified. Defibrillator pads overlie the left chest. Bowel gas pattern is unremarkable. Interstitial and ground-glass opacities and small left pleural effusion consistent with pulmonary edema. IMPRESSION: 1. Enteric catheter overlying gastric body. 2. Pulmonary edema. Electronically Signed   By: Randa Ngo M.D.   On: 06/28/2019 15:14   CT HEAD CODE STROKE WO CONTRAST  Result Date: 06/29/2019 CLINICAL DATA:  Code stroke.  Change in mental status EXAM: CT HEAD WITHOUT CONTRAST TECHNIQUE: Contiguous axial images were  obtained from the base of the skull through the vertex without intravenous contrast. COMPARISON:  Head CT from yesterday FINDINGS: Brain: No evidence of acute infarction, hemorrhage, hydrocephalus, extra-axial collection or mass lesion/mass effect. Remote right superior temporal cortically based infarct. Brain atrophy with ventriculomegaly. Chronic small vessel ischemia in the cerebral white matter. Vascular: No hyperdense vessel. Skull: No acute or aggressive finding Sinuses/Orbits: Negative Other: These results were communicated to Xu at 10:07 amon 2/15/2021by text page via the [facility] messaging system. ASPECTS [facility] Stroke Program Early CT Score) Not scored without localizing symptoms. IMPRESSION: 1. No acute finding. 2. Atrophy, chronic small vessel ischemia, and small remote right superior temporal infarct. Electronically Signed   By: Monte Fantasia M.D.   On: 06/29/2019 10:08    Cardiac Studies  Heart cath 2/14  Previously placed Prox Cx to Mid Cx stent (unknown type) is widely patent -the lesion began just distal to the stent  CULPRIT LESION: Mid Cx lesion is 95%  stenosed.  A drug-eluting stent was successfully placed in order to overlap the previously placed stent, using a STENT RESOLUTE ONYX 2.0X15. Postdilated to 2.3 mm - 2.1 mm  Post intervention, there is a 0% residual stenosis.  -----------------------------------  Ost RCA to Prox RCA lesion is 30% stenosed. Previously placed Prox RCA to Mid RCA stent (unknown type) is widely patent.  Mid LAD lesion is 30% stenosed.  -----------------------------------  There is moderate to severe left ventricular systolic dysfunction. The Left Ventricular Ejection Fraction is 25-35% by visual estimate.  LV end diastolic pressure is moderately elevated. There is mild (2+) mitral regurgitation.   SUMMARY  Severe single-vessel CAD with 95% stenosis in mid-distal LCx beyond previously placed stent ? Successful DES PCI of LCx with stent overlapping proximal stent (resolute Onyx DES 2.0 mm x 15 mm--2.3 mm)  Otherwise mild to moderate 30% ostial and proximal RCA and mid LAD stenosis.  ACUTE COMBINED SYSTOLIC AND DIASTOLIC HEART FAILURE: Severely reduced LVEF with Takotsubo pattern, EF estimated 25 to 30%; Moderately elevated LVEDP of 20 mmHg  Persistent atrial fibrillation, rates notably improved following PCI  Borderline hypotension with improved blood pressures following PCI-no evidence of Cardiogenic Shock  Resolution of Right Bundle Branch Block  Evidence of neurologic recovery with the patient opening eyes and following commands while intubated. ->  Follows finger with eyes, squeezes hands.   RECOMMENDATIONS  Based on bed availability, the patient will be transferred to [facility] CVICU (2 Heart).  With A. fib, will start IV heparin 8 hours after sheath removal and convert back to Pradaxa in the morning  Have ordered 2D echocardiogram, suspect reduced EF is partly related to stress cardiomyopathy from cardiac arrest as the reduced EF is well at proportion with her coronary  disease.  Unclear if the patient has permanent A. fib versus paroxysmal, did not convert with defibrillation -> was on digoxin and high-dose metoprolol prior to arrival, neither have been ordered until the patient's blood pressure stabilized, would probably start with low-dose metoprolol in the morning.  Have not ordered other antihypertensives due to borderline blood pressures.   The patient will be cared for under the [facility] service and upon discharge will be returned to the care of Dr. Miquel Dunn.    Patient Profile     86 y.o. female  history of dyslipidemia atrial fibrillation essential hypertension CKD, Stroke, hypothyroidism is code DNR, who reportedly started having chest pain day prior to admission, came in with unresponsive state and was found  to have V. fib arrest status post ACLS with defibrillation and ROSC.  Cardiology was consulted due to elevated troponin consistent with ACS.  I had discussed care plan with Dr. Ellyn Hack who plans to take patient to cardiac cath.  I discussed patient's overall condition with husband and daughter at bedside they understand that patient's overall prognosis is very poor and do not wish to have prolonged artificial life support.  Assessment & Plan   MI complicated with V-fib and cardiac arrest: S/p ACLS and defibrillator (She is DNR-DNI per prior note. She is now extubated but will clarify this with family) S/P emergent heart cath and PCI to LCX  -ASA suppository -Plavix, BB, and high dose statin when able to take PO and when BP allows for BB.    Afib: Chronic (paroxysmal vs permament?)  Per last cariology consult note, she is on Dabigatran, Digoxin and Metoprolol at home.  -Continue IV Heparin -Rate is controlled at 90s -Continue cardiac monitoring  HFrEF: EF 25-30% per heart cath report. TTE scheduled for today. She is on Digoxin and Metoprolol at home.   Respiratory failure post ROSC: Patient was intubated and on  ventilator this morning. Her respiratory status remained stable post extubation. However she became unresponsive post extubation, concerning for seizure activity per neuro evaluation. Will need goal of care and code status clarification with family as last note mentions DNR-DNI.    Altered mental status: Unclear etiology. Seizure metabolic (less likely given sudden change in mental status)  Neurology is on board and kindly following and thinks that it can be seizure. Less likely to be a stroke as she did not have any focal neurologic deficit and CT head unremarkable. Getting EEG. Checking CMP. May repeat CXR when more stable today.  Patient is currently altered: she opens her eyes briefly and follows commands but non verbal and somnolent.  -CMP  -F/u EEG result -F/u MRI result -F/u CT angio head and neck -Appreciate neurology and PCCM rec -Code status clarification with family  CKD:  Now oliguric -Will give gentle IV fluid as patient did not have good urine output post cath. -Monitor Intake output -BMP daily  For questions or updates, please contact [place] Please consult [email or web address] for contact info under        Signed, Dewayne Hatch, MD  06/29/2019, 10:37 AM    I have personally seen and examined this patient. I agree with the assessment and plan as outlined above.  86 yo female presenting with acute lateral STEMI with cardiac arrest in the ED prior to her cath. A drug eluting stent was placed in the Circumflex artery.  Pt transferred to [facility] intubated. She has been on ASA and IV heparin.  Hemodynamically stable overnight.  Extubated this am followed by mental status changes.  She was seen by Neurology. CT head with no IC hemorrhage.   For now will continue supportive care post MI. I have updated her family and they wish for her to be full code for now.  Will add ASA and continue Plavix. Will ask nursing staff to place an NG tube.  Will add high intensity  statin  Will need to restart her beta blocker and ARB before discharge.  Echo at bedside currently. My initial review while the images are being obtained shows overall preserved LV systolic function.   Lauree Chandler 06/29/2019 12:17 PM

## 2019-06-29 NOTE — Procedures (Signed)
Extubation Procedure Note  Patient Details:   Name: Courtney Chan DOB: [birth year] MRN: [id number]   Airway Documentation:    Vent end date: 06/29/19 Vent end time: 0845   Evaluation  O2 sats: stable throughout Complications: No apparent complications Patient did tolerate procedure well. Bilateral Breath Sounds: Clear, Diminished   Yes   Patient extubated to 2L nasal cannula per MD order.  Positive cuff leak noted.  No evidence of stridor.  Patient able to speak post extubation.  Sats currently 94%.  Vitals are stable.  Incentive spirometry placed in patient room however patient complaining of chest pain.  RN aware.  No complications noted at this time.  Elyn Peers 06/29/2019, 8:51 AM

## 2019-06-29 NOTE — Progress Notes (Signed)
NAME:  Courtney Chan, MRN:  [id number], DOB:  05-Jun-1930, LOS: 1 ADMISSION DATE:  06/28/2019, CONSULTATION DATE:  06/29/19 REFERRING MD:  Ellyn Hack, CHIEF COMPLAINT:  NSTEMI   Brief History   86 year old female with history of hyperlipidemia, atrial fibrillation on Pradaxa, CKD, hypothyroidism who presented with chest pain and suffered NSTEMI in V. fib arrest in the ED.  ROSC achieved and taken to the Cath Lab for L circumflex stenting.  Intubated and transferred to [facility] due to lack of ICU beds at [facility].  History of present illness   86 year old female with history of hyperlipidemia, atrial fibrillation, CKD, hypothyroidism who presented to [facility] emergency department with 1 day of chest pain and had V. fib arrest in the ED with approximately 5 minutes ACLS and defibrillation and ROSC.  Found to have NSTEMI with elevated troponin and new RBBB and was taken to the Cath Lab for urgent cardiac catheterization and stenting of the L circumflex.  She was intubated and sedated though with improving neurologic exam and the decision was made not to cool.   She was transferred to [facility] due to lack of ICU bed at [facility].  On arrival she is hemodynamically stable off pressors; opening eyes to voice and following commands on minimal vent settings.  PCCM asked to consult alongside cardiology.  Past Medical History   has a past medical history of Atrial fibrillation ([place]) (2011), Hyperlipidemia, Hypertension, Stroke [facility]), and Thyroid disease.  Significant Hospital Events   2/14 presented to [facility], status post Cath Lab there and transferred to [place]:  CCM  Procedures:  2/14-ETT 2/14-cardiac catheterization  Significant Diagnostic Tests:  2/14 CT head>> no acute findings 2/14 CXR>>central pulmonary vascular congestion and RIGHT suprahilar pulmonary edema  Micro Data:  2/14 SARS-Cov-2 and influenza>> negative  Antimicrobials:    Interim  history/subjective:  Doing well this morning.  Low-dose sedation.  Able to follow commands.  No issues overnight.  Objective   Blood pressure (!) 101/59, pulse 100, temperature 99.3 F (37.4 C), temperature source Oral, resp. rate 10, height 5\' 4"  (1.626 m), weight 71.3 kg, SpO2 100 %.    Vent Mode: PRVC FiO2 (%):  [40 %-100 %] 40 % Set Rate:  [15 bmp-18 bmp] 15 bmp Vt Set:  [430 mL-450 mL] 430 mL PEEP:  [5 cmH20] 5 cmH20 Plateau Pressure:  [14 cmH20-27 cmH20] 14 cmH20   Intake/Output Summary (Last 24 hours) at 06/29/2019 7096 Last data filed at 06/29/2019 0800 Gross per 24 hour  Intake 169.55 ml  Output 435 ml  Net -265.45 ml   Filed Weights   06/28/19 1948  Weight: 71.3 kg    General appearance: 86 y.o., female intubated on MV  Eyes: anicteric sclerae, moist conjunctivae; no lid-lag; PERRLA, tracking appropriately HENT: NCAT; oropharynx, MMM, no mucosal ulcerations; normal hard and soft palate Neck: Trachea midline; FROM, supple, lymphadenopathy, no JVD, ETT in place  Lungs: BL vented breaths  CV: RRR, S1, S2, no MRGs  Abdomen: Soft, non-tender; non-distended Extremities: No peripheral edema, radial and DP pulses present bilaterally  Skin: Normal temperature, turgor and texture; no rash Psych: Appropriate affect Neuro: Alert to voice, moves all 4 ext    Resolved Hospital Problem list     Assessment & Plan:    NSTEMI with V. Fib arrest s/p ROSC and urgent cardiac catheterization and PCI, baseline paroxysmal atrial fibrillation -Continue anticoagulation per cardiology -Echo pending -Atrial fibrillation rate controlled on home dose of digoxin plus  metoprolol -Patient awake alert following commands this morning post cardiac arrest.  Acute hypoxic respiratory failure secondary to cardiac arrest -Intubated in the ED, vent settings minimal on arrival P: -All continuous sedation held -Discussed with bedside nursing and RT -Patient tolerating SBT SAT -Plan for  liberation from mechanical support -PRVC change to pressure support CPAP, 5 over 5/30% FiO2 able to maintain sats in high 90s. -Patient with command can bring tidal volumes into the 600s.  History of CKD -Creatinine 1.05 near baseline P: -Continue to monitor urine output  History of hypothyroidism -Remains on levothyroxine at home dose.   Best practice:  Diet: N.p.o. Pain/Anxiety/Delirium protocol (if indicated): Fentanyl drip VAP protocol (if indicated): HOB 30 degrees, daily wake-up assessment DVT prophylaxis: Per primary GI prophylaxis: Protonix Glucose control: SSI Mobility: Bedrest Code Status: Full code Family Communication: Per primary Disposition: ICU  Labs   CBC: Recent Labs  Lab 06/28/19 1302 06/28/19 2051 06/29/19 0550  WBC 8.0  --  10.9*  HGB 12.7 11.2* 10.9*  HCT 40.3 33.0* 35.8*  MCV 94.8  --  98.4  PLT 386  --  338    Basic Metabolic Panel: Recent Labs  Lab 06/28/19 1302 06/28/19 2051 06/29/19 0550  NA 141 140 141  K 4.4 4.9 4.5  CL 108  --  109  CO2 24  --  20*  GLUCOSE 91  --  120*  BUN 19  --  18  CREATININE 1.05*  --  1.18*  CALCIUM 9.2  --  8.3*   GFR: Estimated Creatinine Clearance: 33.1 mL/min (A) (by C-G formula based on SCr of 1.18 mg/dL (H)). Recent Labs  Lab 06/28/19 1302 06/29/19 0550  WBC 8.0 10.9*    Liver Function Tests: No results for input(s): AST, ALT, ALKPHOS, BILITOT, PROT, ALBUMIN in the last 168 hours. No results for input(s): LIPASE, AMYLASE in the last 168 hours. No results for input(s): AMMONIA in the last 168 hours.  ABG    Component Value Date/Time   PHART 7.388 06/28/2019 2051   PCO2ART 35.3 06/28/2019 2051   PO2ART 88.0 06/28/2019 2051   HCO3 21.4 06/28/2019 2051   TCO2 22 06/28/2019 2051   ACIDBASEDEF 3.0 (H) 06/28/2019 2051   O2SAT 97.0 06/28/2019 2051     Coagulation Profile: Recent Labs  Lab 06/28/19 1522  INR 2.3*    Cardiac Enzymes: No results for input(s): CKTOTAL, CKMB,  CKMBINDEX, TROPONINI in the last 168 hours.  HbA1C: Hgb A1c MFr Bld  Date/Time Value Ref Range Status  06/28/2019 10:16 PM 6.1 (H) 4.8 - 5.6 % Final    Comment:    (NOTE) Pre diabetes:          5.7%-6.4% Diabetes:              >6.4% Glycemic control for   <7.0% adults with diabetes     CBG: No results for input(s): GLUCAP in the last 168 hours.  This patient is critically ill with multiple organ system failure; which, requires frequent high complexity decision making, assessment, support, evaluation, and titration of therapies. This was completed through the application of advanced monitoring technologies and extensive interpretation of multiple databases. During this encounter critical care time was devoted to patient care services described in this note for 32 minutes.  Josephine Igo, DO [facility] 06/29/2019 8:35 AM

## 2019-06-29 NOTE — Progress Notes (Signed)
ANTICOAGULATION CONSULT NOTE  Consult  Pharmacy Consult for Heparin  Indication: Atrial fibrillation  Allergies  Allergen Reactions  . Metronidazole Diarrhea and Nausea And Vomiting  . Oxycodone Other (See Comments)    Altered mental status    Patient Measurements: Height: 5\' 4"  (162.6 cm) Weight: 157 lb 3 oz (71.3 kg) IBW/kg (Calculated) : 54.7 Heparin Dosing Weight: 68 kg   Vital Signs: Temp: 98.7 F (37.1 C) (02/15 1938) Temp Source: Oral (02/15 1549) BP: 117/66 (02/15 1900) Pulse Rate: 77 (02/15 1900)  Labs: Recent Labs    06/28/19 1302 06/28/19 1302 06/28/19 1522 06/28/19 2051 06/29/19 0550 06/29/19 1200 06/29/19 2012  HGB 12.7   < >  --  11.2* 10.9*  --   --   HCT 40.3  --   --  33.0* 35.8*  --   --   PLT 386  --   --   --  338  --   --   APTT  --   --  66*  --   --   --   --   LABPROT  --   --  25.2*  --   --   --   --   INR  --   --  2.3*  --   --   --   --   HEPARINUNFRC  --   --   --   --   --   --  0.30  CREATININE 1.05*  --   --   --  1.18* 1.24*  --   TROPONINIHS 11,576*  --  13,408*  --   --   --   --    < > = values in this interval not displayed.    Estimated Creatinine Clearance: 31.5 mL/min (A) (by C-G formula based on SCr of 1.24 mg/dL (H)).  Assessment: 86 yo female with h/o Afib s/p LCx PCI 2/14, Pradaxa on hold.  CVA/head bleed ruled out this am after having altered mental status post extubation. Will resume heparin, plavix, and aspirin today. Hold pradaxa for now, with plans to resume prior to discharge. May need 2b3a inhibitor if unable to take po's today.  Heparin level this PM is therapeutic at 0.3.  Goal of Therapy:  Heparin level 0.3-0.7 units/ml Monitor platelets by anticoagulation protocol: Yes   Plan:  Continue heparin 800 units/hr Check heparin level with am labs.  3/14, PharmD, [facility] Clinical Pharmacist Please see AMION for all Pharmacists' Contact Phone Numbers 06/29/2019, 9:07 PM

## 2019-06-29 NOTE — Plan of Care (Signed)
CT no acute finding. CTA head and neck no LVO. Will do stat EEG and keppra load. Will do routine MRI brain. Given her hx of afib and CTA head and neck concerning for LAA thrombus, OK to resume heparin IV. OK to continue ASA and plavix as scheduled. Will follow up on EEG.   Pt was able to open eyes on voice after CT completed, able to move all limbs symmetrically after CT. Will need to continue monitoring.  Marvel Plan, MD PhD Stroke Neurology 06/29/2019 10:39 AM

## 2019-06-29 NOTE — Procedures (Signed)
Patient Name: Courtney Chan  MRN: [id number]  Epilepsy Attending: Charlsie Quest  Referring Physician/Provider: Dr. Marvel Plan Date: 06/29/2019 Duration: 25.15 minutes  Patient history: 86 year old female with history of prior stroke now status post cardiac arrest and seizure-like activity.  EEG to evaluate for seizures.  Level of alertness: Comatose  AEDs during EEG study: Keppra, lorazepam  Technical aspects: This EEG study was done with scalp electrodes positioned according to the 10-20 International system of electrode placement. Electrical activity was acquired at a sampling rate of 500Hz  and reviewed with a high frequency filter of 70Hz  and a low frequency filter of 1Hz . EEG data were recorded continuously and digitally stored.   Description: EEG showed continuous generalized low amplitude 2 to 3 Hz delta slowing as well as intermittent 5 to 6 Hz generalized theta slowing.  EEG was reactive to noxious stimuli.  Hyperventilation and photic stimulation were not performed.  Abnormality -Continuous slow, generalized  IMPRESSION: This study is suggestive of severe diffuse encephalopathy, nonspecific etiology. No seizures or epileptiform discharges were seen throughout the recording.  Ariadne Rissmiller 

## 2019-06-29 NOTE — Progress Notes (Signed)
Pt extubated 0845 alert and complaining of chest pain d/t CPR.  RN arrived in pt's room at 0915 and pt was unresponsive and moving all extremities.  CCM immediately came to bedside to evaluate patient.  Neuro consulted.  Ativan given and pt transported to CT.  EEG performed and awaiting results.  Pt is now responsive to pain and will open eyes and try to talk. Vitals stable throughout.  RN will continue to monitor closely.  Pt's family updated.

## 2019-06-29 NOTE — Progress Notes (Signed)
  Echocardiogram 2D Echocardiogram has been performed.  Leta Jungling M 06/29/2019, 12:35 PM

## 2019-06-30 ENCOUNTER — Inpatient Hospital Stay (HOSPITAL_COMMUNITY): Payer: Medicare PPO

## 2019-06-30 DIAGNOSIS — I214 Non-ST elevation (NSTEMI) myocardial infarction: Secondary | ICD-10-CM

## 2019-06-30 LAB — HEPARIN LEVEL (UNFRACTIONATED): Heparin Unfractionated: 0.33 IU/mL (ref 0.30–0.70)

## 2019-06-30 LAB — BASIC METABOLIC PANEL
Anion gap: 7 (ref 5–15)
BUN: 18 mg/dL (ref 8–23)
CO2: 22 mmol/L (ref 22–32)
Calcium: 8.2 mg/dL — ABNORMAL LOW (ref 8.9–10.3)
Chloride: 114 mmol/L — ABNORMAL HIGH (ref 98–111)
Creatinine, Ser: 1.05 mg/dL — ABNORMAL HIGH (ref 0.44–1.00)
GFR calc Af Amer: 56 mL/min — ABNORMAL LOW (ref >60)
GFR calc non Af Amer: 48 mL/min — ABNORMAL LOW (ref >60)
Glucose, Bld: 106 mg/dL — ABNORMAL HIGH (ref 70–99)
Potassium: 4 mmol/L (ref 3.5–5.1)
Sodium: 143 mmol/L (ref 135–145)

## 2019-06-30 LAB — CBC
HCT: 34.5 % — ABNORMAL LOW (ref 36.0–46.0)
Hemoglobin: 10.4 g/dL — ABNORMAL LOW (ref 12.0–15.0)
MCH: 30 pg (ref 26.0–34.0)
MCHC: 30.1 g/dL (ref 30.0–36.0)
MCV: 99.4 fL (ref 80.0–100.0)
Platelets: 289 10*3/uL (ref 150–400)
RBC: 3.47 MIL/uL — ABNORMAL LOW (ref 3.87–5.11)
RDW: 14.6 % (ref 11.5–15.5)
WBC: 9.7 10*3/uL (ref 4.0–10.5)
nRBC: 0 % (ref 0.0–0.2)

## 2019-06-30 LAB — GLUCOSE, CAPILLARY
Glucose-Capillary: 95 mg/dL (ref 70–99)
Glucose-Capillary: 90 mg/dL (ref 70–99)
Glucose-Capillary: 92 mg/dL (ref 70–99)

## 2019-06-30 MED ORDER — METOPROLOL TARTRATE 25 MG PO TABS
25.0000 mg | ORAL_TABLET | Freq: Two times a day (BID) | ORAL | Status: DC
  Administered 2019-06-30 – 2019-07-01 (×4): 25 mg via ORAL
  Filled 2019-06-30 (×4): qty 1

## 2019-06-30 MED ORDER — FUROSEMIDE 10 MG/ML IJ SOLN
40.0000 mg | Freq: Once | INTRAMUSCULAR | Status: AC
  Administered 2019-06-30: 40 mg via INTRAVENOUS
  Filled 2019-06-30: qty 4

## 2019-06-30 NOTE — Progress Notes (Signed)
Pt removed NG tube. Critical Care MD paged. Instructed not to place another NGT and keeo pt NPO, will pass along to morning staff tomorrow. Continuing to monitor pt.

## 2019-06-30 NOTE — Progress Notes (Addendum)
Progress Note  Patient Name: Courtney Chan Date of Encounter: 06/30/2019  Primary Cardiologist: No primary care provider on file.  Patient profile   with history of HLD, HTN, CKD, A. fib on Pradaxa at home, hypothyroidism, CVA, who presented with acute lateral STEMI with cardiac arrest in the ED prior to her cath. A drug eluting stent was placed in the Circumflex artery. Pt transferred to [facility] intubated. Extubated at 2/15 followed by mental status changes. Her mental status is now back to normal.  Subjective  Ms. Skilton is feeling well today. She is now alert, awake and able to answer my questions. She complains of some chest wall pain when she take a deep breath. No other complain. No SOB.  Inpatient Medications    Scheduled Meds: . aspirin  81 mg Per Tube Daily   Or  . aspirin  150 mg Rectal Daily  . atorvastatin  80 mg Per Tube q1800  . chlorhexidine  15 mL Mouth Rinse BID  . Chlorhexidine Gluconate Cloth  6 each Topical Daily  . clopidogrel  75 mg Per Tube Daily  . levothyroxine  88 mcg Per Tube Q0600  . mouth rinse  15 mL Mouth Rinse q12n4p  . pantoprazole sodium  40 mg Per Tube Daily  . sodium chloride flush  3 mL Intravenous Q12H   Continuous Infusions: . sodium chloride    . sodium chloride    . heparin 800 Units/hr (06/30/19 0600)  . levETIRAcetam Stopped (06/29/19 2151)   PRN Meds: sodium chloride, acetaminophen (TYLENOL) oral liquid 160 mg/5 mL, fentaNYL, sodium chloride flush   Vital Signs    Vitals:   06/30/19 0400 06/30/19 0430 06/30/19 0500 06/30/19 0600  BP: 116/68 (!) 114/53 119/60 123/76  Pulse: (!) 111 84 99 (!) 102  Resp: 20 17 15 16   Temp:      TempSrc:      SpO2: 95% 96% 98% 98%  Weight:    71.5 kg  Height:        Intake/Output Summary (Last 24 hours) at 06/30/2019 0747 Last data filed at 06/30/2019 0600 Gross per 24 hour  Intake 1112.34 ml  Output 325 ml  Net 787.34 ml   Last 3 Weights 06/30/2019 06/28/2019 06/28/2019  Weight (lbs)  157 lb 10.1 oz 157 lb 3 oz 150 lb  Weight (kg) 71.5 kg 71.3 kg 68.04 kg      Telemetry    Afib with rate of 120-130- Personally Reviewed  ECG    Not performed - Personally Reviewed  Physical Exam   GEN: No acute distress.   Neck: No JVD Cardiac: RRR, no murmurs, rubs, or gallops.  Respiratory: Mild crackle GI: Soft, nontender, non-distended  MS: No edema; No deformity. Neuro:  A&O x 3 today. Nonfocal  Psych: Normal affect   Labs    High Sensitivity Troponin:   Recent Labs  Lab 06/28/19 1302 06/28/19 1522  TROPONINIHS 11,576* 13,408*      Chemistry Recent Labs  Lab 06/29/19 0550 06/29/19 1200 06/30/19 0215  NA 141 142 143  K 4.5 4.4 4.0  CL 109 109 114*  CO2 20* 23 22  GLUCOSE 120* 115* 106*  BUN 18 18 18   CREATININE 1.18* 1.24* 1.05*  CALCIUM 8.3* 8.4* 8.2*  PROT  --  5.4*  --   ALBUMIN  --  2.9*  --   AST  --  70*  --   ALT  --  29  --   ALKPHOS  --  71  --   BILITOT  --  0.8  --   GFRNONAA 42* 39* 48*  GFRAA 48* 46* 56*  ANIONGAP 12 10 7      Hematology Recent Labs  Lab 06/28/19 1302 06/28/19 1302 06/28/19 2051 06/29/19 0550 06/30/19 0215  WBC 8.0  --   --  10.9* 9.7  RBC 4.25  --   --  3.64* 3.47*  HGB 12.7   < > 11.2* 10.9* 10.4*  HCT 40.3   < > 33.0* 35.8* 34.5*  MCV 94.8  --   --  98.4 99.4  MCH 29.9  --   --  29.9 30.0  MCHC 31.5  --   --  30.4 30.1  RDW 13.8  --   --  14.2 14.6  PLT 386  --   --  338 289   < > = values in this interval not displayed.    BNPNo results for input(s): BNP, PROBNP in the last 168 hours.   DDimer No results for input(s): DDIMER in the last 168 hours.   Radiology    CT ANGIO HEAD W OR WO CONTRAST  Result Date: 06/29/2019 CLINICAL DATA:  Stroke follow-up EXAM: CT ANGIOGRAPHY HEAD AND NECK TECHNIQUE: Multidetector CT imaging of the head and neck was performed using the standard protocol during bolus administration of intravenous contrast. Multiplanar CT image reconstructions and MIPs were obtained to  evaluate the vascular anatomy. Carotid stenosis measurements (when applicable) are obtained utilizing NASCET criteria, using the distal internal carotid diameter as the denominator. CONTRAST:  Dose is currently not available COMPARISON:  None. FINDINGS: CTA NECK FINDINGS Aortic arch: Atherosclerotic plaque.  Two vessel branching. Right carotid system: Mild to moderate mainly calcified plaque at the bifurcation/bulb. No flow limiting stenosis or ulceration. Left carotid system: Atheromatous plaque of the distal common carotid which is mild for age. No flow limiting stenosis or ulceration. Vertebral arteries: Proximal subclavian atherosclerosis on the left more than right without flow limiting stenosis or ulceration. Codominant vertebral arteries that show robust flow to the dura. Skeleton: Degenerative changes without acute or aggressive finding. Other neck: No evidence of mass or inflammation Upper chest: Ground-glass and streaky opacity in the right more than left lung. No opacification at the left atrial appendage. Review of the MIP images confirms the above findings CTA HEAD FINDINGS Anterior circulation: Atherosclerotic plaque on the carotid siphons. No branch occlusion or flow limiting stenosis. Negative for aneurysm Posterior circulation: Vertebral and basilar arteries are smooth and widely patent. No branch occlusion or aneurysm. Venous sinuses: Unremarkable Anatomic variants: None significant Review of the MIP images confirms the above findings IMPRESSION: 1. No emergent vascular finding 2. Atherosclerosis without flow limiting stenosis or ulceration in the head and neck. 3. Non opacified left atrial appendage in the setting of atrial fibrillation that could be delayed filling versus thrombus. 4. Right more than left pneumonia and atelectasis. Electronically Signed   By: Monte Fantasia M.D.   On: 06/29/2019 10:29   DG Chest 2 View  Result Date: 06/28/2019 CLINICAL DATA:  Chest pain shortness of breath  EXAM: CHEST - 2 VIEW COMPARISON:  August 25, 2017 FINDINGS: The mediastinal contour and cardiac silhouette are normal. Mild opacity of left lung base is identified at least in part due to atelectasis but superimposed early pneumonia is not excluded. There is no pulmonary edema or pleural effusion. The bony structures are stable. IMPRESSION: Mild opacity of left lung base at least in part due to atelectasis but superimposed early  pneumonia is not excluded. Electronically Signed   By: Sherian Rein M.D.   On: 06/28/2019 14:41   DG Abd 1 View  Result Date: 06/29/2019 CLINICAL DATA:  NG tube placement. EXAM: ABDOMEN - 1 VIEW COMPARISON:  06/28/2019 FINDINGS: The tip of the NG tube is in the midbody of the stomach. The side hole is near the gastroesophageal junction. Bowel gas pattern is normal. Contrast is seen in the nondistended renal collecting systems. IMPRESSION: NG tube tip in the body of the stomach. Electronically Signed   By: Francene Boyers M.D.   On: 06/29/2019 14:59   CT Head Wo Contrast  Result Date: 06/28/2019 CLINICAL DATA:  Cardiac arrest, unresponsive and pulseless upon arrival to ED, intubated EXAM: CT HEAD WITHOUT CONTRAST TECHNIQUE: Contiguous axial images were obtained from the base of the skull through the vertex without intravenous contrast. COMPARISON:  06/28/2019 FINDINGS: Brain: Chronic small vessel ischemic changes are seen within the bilateral frontal periventricular white matter and left external capsule. No acute infarct or hemorrhage. Lateral ventricles and remaining midline structures are unremarkable. There are no acute extra-axial fluid collections. There is no mass effect. Vascular: No hyperdense vessel or unexpected calcification. Skull: Normal. Negative for fracture or focal lesion. Sinuses/Orbits: No acute finding. Other: None IMPRESSION: 1. Stable head CT, no acute intracranial process. Electronically Signed   By: Sharlet Salina M.D.   On: 06/28/2019 15:57   CT ANGIO NECK W  OR WO CONTRAST  Result Date: 06/29/2019 CLINICAL DATA:  Stroke follow-up EXAM: CT ANGIOGRAPHY HEAD AND NECK TECHNIQUE: Multidetector CT imaging of the head and neck was performed using the standard protocol during bolus administration of intravenous contrast. Multiplanar CT image reconstructions and MIPs were obtained to evaluate the vascular anatomy. Carotid stenosis measurements (when applicable) are obtained utilizing NASCET criteria, using the distal internal carotid diameter as the denominator. CONTRAST:  Dose is currently not available COMPARISON:  None. FINDINGS: CTA NECK FINDINGS Aortic arch: Atherosclerotic plaque.  Two vessel branching. Right carotid system: Mild to moderate mainly calcified plaque at the bifurcation/bulb. No flow limiting stenosis or ulceration. Left carotid system: Atheromatous plaque of the distal common carotid which is mild for age. No flow limiting stenosis or ulceration. Vertebral arteries: Proximal subclavian atherosclerosis on the left more than right without flow limiting stenosis or ulceration. Codominant vertebral arteries that show robust flow to the dura. Skeleton: Degenerative changes without acute or aggressive finding. Other neck: No evidence of mass or inflammation Upper chest: Ground-glass and streaky opacity in the right more than left lung. No opacification at the left atrial appendage. Review of the MIP images confirms the above findings CTA HEAD FINDINGS Anterior circulation: Atherosclerotic plaque on the carotid siphons. No branch occlusion or flow limiting stenosis. Negative for aneurysm Posterior circulation: Vertebral and basilar arteries are smooth and widely patent. No branch occlusion or aneurysm. Venous sinuses: Unremarkable Anatomic variants: None significant Review of the MIP images confirms the above findings IMPRESSION: 1. No emergent vascular finding 2. Atherosclerosis without flow limiting stenosis or ulceration in the head and neck. 3. Non opacified  left atrial appendage in the setting of atrial fibrillation that could be delayed filling versus thrombus. 4. Right more than left pneumonia and atelectasis. Electronically Signed   By: Marnee Spring M.D.   On: 06/29/2019 10:29   CARDIAC CATHETERIZATION  Result Date: 06/28/2019  Previously placed Prox Cx to Mid Cx stent (unknown type) is widely patent -the lesion began just distal to the stent  CULPRIT LESION: Mid Cx  lesion is 95% stenosed.  A drug-eluting stent was successfully placed in order to overlap the previously placed stent, using a STENT RESOLUTE ONYX 2.0X15. Postdilated to 2.3 mm - 2.1 mm  Post intervention, there is a 0% residual stenosis.  -----------------------------------  Ost RCA to Prox RCA lesion is 30% stenosed. Previously placed Prox RCA to Mid RCA stent (unknown type) is widely patent.  Mid LAD lesion is 30% stenosed.  -----------------------------------  There is moderate to severe left ventricular systolic dysfunction. The Left Ventricular Ejection Fraction is 25-35% by visual estimate.  LV end diastolic pressure is moderately elevated. There is mild (2+) mitral regurgitation.  SUMMARY  Severe single-vessel CAD with 95% stenosis in mid-distal LCx beyond previously placed stent  Successful DES PCI of LCx with stent overlapping proximal stent (resolute Onyx DES 2.0 mm x 15 mm--2.3 mm)  Otherwise mild to moderate 30% ostial and proximal RCA and mid LAD stenosis.  ACUTE COMBINED SYSTOLIC AND DIASTOLIC HEART FAILURE: Severely reduced LVEF with Takotsubo pattern, EF estimated 25 to 30%; Moderately elevated LVEDP of 20 mmHg  Persistent atrial fibrillation, rates notably improved following PCI  Borderline hypotension with improved blood pressures following PCI-no evidence of Cardiogenic Shock  Resolution of Right Bundle Branch Block  Evidence of neurologic recovery with the patient opening eyes and following commands while intubated. ->  Follows finger with eyes, squeezes  hands. RECOMMENDATIONS  Based on bed availability, [facility]  With A. fib, will start IV heparin 8 hours after sheath removal and convert back to Pradaxa in the morning  Have ordered 2D echocardiogram, suspect reduced EF is partly related to stress cardiomyopathy from cardiac arrest as the reduced EF is well at proportion with her coronary disease.  Unclear if the patient has permanent A. fib versus paroxysmal, did not convert with defibrillation -> was on digoxin and high-dose metoprolol prior to arrival, neither have been ordered until the patient's blood pressure stabilized, would probably start with low-dose metoprolol in the morning.  Have not ordered other antihypertensives due to borderline blood pressures. The [facility] and upon discharge will be returned to the care of Dr. Jamse MeadAlex Paraschos. Bryan Lemmaavid Harding, MD  DG Chest Port 1 View  Result Date: 06/30/2019 CLINICAL DATA:  Tachypnea.  Recent stent placement EXAM: PORTABLE CHEST 1 VIEW COMPARISON:  Two days ago FINDINGS: The endotracheal tube is no longer seen. Lung volumes are lower and there is increased diffuse interstitial opacity. The enteric tube reaches the stomach. Stable heart size and mediastinal contours when allowing for overlapping defibrillator pad. No visible effusion or pneumothorax IMPRESSION: Low volume chest with increased interstitial opacity, likely edema and atelectasis. Electronically Signed   By: Marnee SpringJonathon  Watts M.D.   On: 06/30/2019 05:39   DG CHEST PORT 1 VIEW  Result Date: 06/28/2019 CLINICAL DATA:  Post cardiac arrest earlier today. EXAM: PORTABLE CHEST 1 VIEW COMPARISON:  Chest x-rays from earlier same day. FINDINGS: Endotracheal tube is well positioned with tip just above the level of the carina. Enteric tube passes below the diaphragm. Pacer pads overlie the heart. There is persistent central pulmonary  vascular congestion and RIGHT suprahilar edema. Probable atelectasis at the LEFT lung base. No pleural effusion or pneumothorax is seen. IMPRESSION: 1. No significant change compared to today's earlier chest x-ray. Persistent central pulmonary vascular congestion and RIGHT suprahilar pulmonary edema. No pleural effusion or pneumothorax. 2. Endotracheal tube well positioned  with tip just above the level of the carina. Electronically Signed   By: Bary Richard M.D.   On: 06/28/2019 20:53   DG Chest Portable 1 View  Result Date: 06/28/2019 CLINICAL DATA:  Status post arrest, intubated EXAM: PORTABLE CHEST 1 VIEW COMPARISON:  06/28/2019 FINDINGS: Single frontal view of the chest demonstrates defibrillator pads overlying left chest. Endotracheal tube overlies tracheal air column, tip just below thoracic inlet. Enteric catheter passes below diaphragm, tip excluded by collimation. Numerous cardiac leads overlie the chest. Cardiac silhouette is stable. Since the prior exam, diffuse interstitial and ground-glass opacities have developed consistent with pulmonary edema. Small left pleural effusion is noted. There is no pneumothorax on this supine exam. No acute displaced fractures. IMPRESSION: 1. Support devices as above.  No evidence of malpositioning. 2. Interval development of mild pulmonary edema. Electronically Signed   By: Sharlet Salina M.D.   On: 06/28/2019 15:13   DG Abd Portable 1 View  Result Date: 06/28/2019 CLINICAL DATA:  Intubated, enteric catheter placement, arrest EXAM: PORTABLE ABDOMEN - 1 VIEW COMPARISON:  None. FINDINGS: Frontal view of the lower chest and upper abdomen demonstrates enteric catheter tip and side port projecting over gastric body. Numerous cardiac leads are identified. Defibrillator pads overlie the left chest. Bowel gas pattern is unremarkable. Interstitial and ground-glass opacities and small left pleural effusion consistent with pulmonary edema. IMPRESSION: 1. Enteric catheter  overlying gastric body. 2. Pulmonary edema. Electronically Signed   By: Sharlet Salina M.D.   On: 06/28/2019 15:14   EEG adult  Result Date: 06/29/2019 Charlsie Quest, MD     06/29/2019  2:00 PM Patient Name: COTY STUDENT MRN: [id number] Epilepsy Attending: Charlsie Quest Referring Physician/Provider: Dr. Marvel Plan Date: 06/29/2019 Duration: 25.15 minutes Patient history: 86 year old female with history of prior stroke now status post cardiac arrest and seizure-like activity.  EEG to evaluate for seizures. Level of alertness: Comatose AEDs during EEG study: Keppra, lorazepam Technical aspects: This EEG study was done with scalp electrodes positioned according to the 10-20 International system of electrode placement. Electrical activity was acquired at a sampling rate of 500Hz  and reviewed with a high frequency filter of 70Hz  and a low frequency filter of 1Hz . EEG data were recorded continuously and digitally stored. Description: EEG showed continuous generalized low amplitude 2 to 3 Hz delta slowing as well as intermittent 5 to 6 Hz generalized theta slowing.  EEG was reactive to noxious stimuli.  Hyperventilation and photic stimulation were not performed. Abnormality -Continuous slow, generalized IMPRESSION: This study is suggestive of severe diffuse encephalopathy, nonspecific etiology. No seizures or epileptiform discharges were seen throughout the recording. Charlsie Quest   ECHOCARDIOGRAM COMPLETE  Result Date: 06/29/2019    ECHOCARDIOGRAM REPORT   Patient Name:   THU BAGGETT Date of Exam: 06/29/2019 Medical Rec #:  [id number]        Height:       64.0 in Accession #:    [id number]       Weight:       157.2 lb Date of Birth:  [birth year]         BSA:          1.77 m Patient Age:    86 years         BP:           95/52 mmHg Patient Gender: F                HR:  86 bpm. Exam Location:  Inpatient Procedure: 2D Echo Indications:    Acute myocardial infarction 410  History:        Patient  has no prior history of Echocardiogram examinations.                 CAD, Stroke, Arrythmias:Atrial Fibrillation; Risk                 Factors:Hypertension and Dyslipidemia. Chronic kidney disease.                 V. fib arrest. Awake and altert while intubated, when extubated                 altered mental status.  Sonographer:    Leta Junglingiffany Cooper RDCS Referring Phys: [id number] STEPHANIE FALK MARTIN IMPRESSIONS  1. Left ventricular ejection fraction, by estimation, is 55 to 60%. The left ventricle has normal function. The left ventricle has no regional wall motion abnormalities. Left ventricular diastolic function could not be evaluated.  2. Right ventricular systolic function is normal. The right ventricular size is normal. There is mildly elevated pulmonary artery systolic pressure.  3. The mitral valve is degenerative. Mild mitral valve regurgitation. No evidence of mitral stenosis.  4. Tricuspid valve regurgitation is mild to moderate.  5. The aortic valve is tricuspid. Aortic valve regurgitation is not visualized. Mild to moderate aortic valve sclerosis/calcification is present, without any evidence of aortic stenosis. Comparison(s): No prior Echocardiogram. FINDINGS  Left Ventricle: Left ventricular ejection fraction, by estimation, is 55 to 60%. The left ventricle has normal function. The left ventricle has no regional wall motion abnormalities. There is no left ventricular hypertrophy. The left ventricular diastology could not be evaluated due to atrial fibrillation. Left ventricular diastolic function could not be evaluated. Right Ventricle: The right ventricular size is normal. No increase in right ventricular wall thickness. Right ventricular systolic function is normal. There is mildly elevated pulmonary artery systolic pressure. Left Atrium: Left atrial size was normal in size. Right Atrium: Right atrial size was normal in size. Pericardium: There is no evidence of pericardial effusion. Presence of  pericardial fat pad. Mitral Valve: The mitral valve is degenerative in appearance. Severe mitral annular calcification. Mild mitral valve regurgitation. No evidence of mitral valve stenosis. Tricuspid Valve: The tricuspid valve is grossly normal. Tricuspid valve regurgitation is mild to moderate. Aortic Valve: The aortic valve is tricuspid. . There is moderate thickening and moderate calcification of the aortic valve. Aortic valve regurgitation is not visualized. Mild to moderate aortic valve sclerosis/calcification is present, without any evidence of aortic stenosis. There is moderate thickening of the aortic valve. There is moderate calcification of the aortic valve. Pulmonic Valve: The pulmonic valve was grossly normal. Pulmonic valve regurgitation is trivial. Aorta: The aortic root and ascending aorta are structurally normal, with no evidence of dilitation. Venous: IVC assessment for right atrial pressure unable to be performed due to mechanical ventilation. IAS/Shunts: No atrial level shunt detected by color flow Doppler.  LEFT VENTRICLE PLAX 2D LVIDd:         3.23 cm  Diastology LVIDs:         1.70 cm  LV e' lateral:   5.84 cm/s LV PW:         1.11 cm  LV E/e' lateral: 18.2 LV IVS:        1.12 cm  LV e' medial:    6.20 cm/s LVOT diam:     1.80 cm  LV E/e' medial:  17.2 LV SV:         30.79 ml LV SV Index:   18.50 LVOT Area:     2.54 cm  RIGHT VENTRICLE RVOT diam:      2.20 cm TAPSE (M-mode): 1.0 cm LEFT ATRIUM             Index       RIGHT ATRIUM           Index LA diam:        3.30 cm 1.87 cm/m  RA Area:     20.50 cm LA Vol (A2C):   43.3 ml 24.52 ml/m RA Volume:   56.80 ml  32.16 ml/m LA Vol (A4C):   49.1 ml 27.80 ml/m LA Biplane Vol: 46.9 ml 26.56 ml/m  AORTIC VALVE LVOT Vmax:   64.30 cm/s LVOT Vmean:  40.800 cm/s LVOT VTI:    0.121 m  AORTA Ao Root diam: 2.70 cm Ao Asc diam:  2.80 cm MITRAL VALVE                TRICUSPID VALVE MV Area (PHT): 5.32 cm     TR Peak grad:   30.2 mmHg MV Decel Time: 143  msec     TR Vmax:        275.00 cm/s MV E velocity: 106.33 cm/s                             SHUNTS                             Systemic VTI:  0.12 m                             Systemic Diam: 1.80 cm                             Pulmonic Diam: 2.20 cm Lennie Odor MD Electronically signed by Lennie Odor MD Signature Date/Time: 06/29/2019/3:24:25 PM    Final    CT HEAD CODE STROKE WO CONTRAST  Result Date: 06/29/2019 CLINICAL DATA:  Code stroke.  Change in mental status EXAM: CT HEAD WITHOUT CONTRAST TECHNIQUE: Contiguous axial images were obtained from the base of the skull through the vertex without intravenous contrast. COMPARISON:  Head CT from yesterday FINDINGS: Brain: No evidence of acute infarction, hemorrhage, hydrocephalus, extra-axial collection or mass lesion/mass effect. Remote right superior temporal cortically based infarct. Brain atrophy with ventriculomegaly. Chronic small vessel ischemia in the cerebral white matter. Vascular: No hyperdense vessel. Skull: No acute or aggressive finding Sinuses/Orbits: Negative Other: These results were communicated to Xu at 10:07 amon 2/15/2021by text page via the [facility] messaging system. ASPECTS [facility] Stroke Program Early CT Score) Not scored without localizing symptoms. IMPRESSION: 1. No acute finding. 2. Atrophy, chronic small vessel ischemia, and small remote right superior temporal infarct. Electronically Signed   By: Marnee Spring M.D.   On: 06/29/2019 10:08    Cardiac Studies   Echo 06/29/2019:  IMPRESSIONS   1. Left ventricular ejection fraction, by estimation, is 55 to 60%. The  left ventricle has normal function. The left ventricle has no regional  wall motion abnormalities. Left ventricular diastolic function could not  be evaluated.  2. Right ventricular systolic function is normal. The right ventricular  size is normal. There is mildly  elevated pulmonary artery systolic  pressure.  3. The mitral valve is degenerative. Mild  mitral valve regurgitation. No  evidence of mitral stenosis.  4. Tricuspid valve regurgitation is mild to moderate.  5. The aortic valve is tricuspid. Aortic valve regurgitation is not  visualized. Mild to moderate aortic valve sclerosis/calcification is  present, without any evidence of aortic stenosis.   Comparison(s): No prior Echocardiogram.  Patient Profile     86 y.o. female with history of HLD, HTN, CKD, A. fib on Pradaxa at home, hypothyroidism, CVA, who presented with acute lateral STEMI with cardiac arrest in the ED prior to her cath. A drug eluting stent was placed in the Circumflex artery. Pt transferred to [facility] [facility]. Extubated at 2/15 followed by mental status changes. Her mental status is now back to normal.  Assessment & Plan    Lateral STEMI Comlicated with cardiac arrest in ED prior to heart cath. S/o cpr and cardioversion>ROSC>intubated>Cardiac cath and stent placement in circumflex artery 2/15. Now extubated and stable.  -On ASA, Plavix -Continue Statin -Starting BB today as BP improved -May add ACE I/ARBtomorrow if BP and kidney function allow -Patient is on DAPT and also has been on anticoagulation for Afib (IV Heparin athospital and Dabigatran at home). Will continue tripple treatment now but given her age and high risk for bleeding, will likely DC ASA at discharge.  Afib: On Digoxin, Dabigatran, and Metoprolol at home Her HR has been elevated at ~120-130 since last night.  -Will resume BB today  -Will switch IV Heparin to home Dabigatran today if passes swallow eval and can take PO -Continue cardiac monitoring  HFrEF: EF 25-30% per heart cath report. TTE 06/29/2019 showed improved EF at 55-60%. She has some crackles on exam and CXR today showed some increased intrastitial opacity.  Will give one dose of IV lasix today. -IV Lasix 40 mg once -BMP daily -Strict intake output -Starting Metoprolol today -May consider starting ACE I/ARB tomorrow id BP and  kidney function will be stable   Decreased urine output: Patient has CKD. Cr in hospital stable at ~1.05, 1.2 which is close to her baseline  Some response to IV fluid yesterday but still not enough output. Bladder scan yesterday did not show retention. Will do lasix trial as she has some volume overload on CXR. If no improvement, will consider further work up such as renal US.  Mental status change post extubation: Resolved. Patient is now alert and oriented x 3. Unclear etiology.  Did not find any metabolic of infectious cause.  Neurology evaluated patients at the moment: Could be sizure vs LAA thrombus, given history of afib and CTA head and neck per neuro evaluation No seizure activity on EEG but can be seizure and loaded with Keppra. Neurology is following.   No acute finding on CT head. Brain MRI is pending Per neuro comments yesterday, given her hx of afib and CTA head and neck, concerning for LAA thrombus. She is on IV heparin, ASA and plavix (OK to continue per neurology team) per neuro.   EEG did not show seizure activity. Patient is on Keppra. -On Keppra -Appreciate neurology follow up and recommendations -F/u swallow eval today and if passess, may consider PO diet if neurology agrees  For questions or updates, please contact [facility] Please consult [email or web address] for contact info under        Signed, Chevis Pretty, MD  06/30/2019, 7:47 AM    I have personally seen and examined this patient. I agree  with the assessment and plan as outlined above. Please note, she had a NSTEMI with cardiac arrest. This was reviewed with Dr. Herbie Baltimore who managed her initially at [facility].  She is much improved this am. Mental status seems to be back at baseline. CT head with no IC bleeding. Brain MRI pending. Neurology following.  Echo with preserved LV systolic function.  Will continue ASA, Plavix, statin and beta blocker.  Mild volume overload on exam with evidence of mild  interstitial edema on chest x-ray.  Will given Lasix 40 mg IV x 1 now.  Anticipate another 24-48 hours in the ICU.  Family updated in waiting area.   Verne Carrow 06/30/2019 12:08 PM

## 2019-06-30 NOTE — Discharge Summary (Signed)
Patient was never fully admitted to [facility].  Underwent cardiac catheterization after initial evaluation in the ER.  Was transferred post-cath to [facility].  See consult note and cath note for details.  Bryan Lemma, MD

## 2019-06-30 NOTE — Evaluation (Addendum)
Physical Therapy Evaluation Patient Details Name: Courtney Chan MRN: [id number] DOB: 04/18/1931 Today's Date: 06/30/2019   History of Present Illness  Courtney Chan is a 86 y.o. female with a hx of CAD with PCI to the RCA and Circumflex in 2010 along with chronic persistent A. fib, prior CVA, HTN, HLD, who presented to [facility] on 06/28/19 with cardiac arrest-V fib and elevated troponin. Pt was revived in ED after undergoing CPR and defibrilation. She was found to be in A. fib RVR. Pt now s/p cardiac cath on 06/28/19. Pt now found to have small acute infarct of posterior aspect of the leftsuperior frontal gyrus.    Clinical Impression  Courtney Chan is 86 y.o. female admitted with above HPI and diagnosis. Patient is currently limited by functional impairments below (see PT problem list). Patient lives with her husband and is independent at baseline; pt was very active prior to cardiac event and was walking 2 miles/day. Patient was limited today by poor safety awareness and required multimodal cues for safe sequencing for bed mob, transfers, and gait with 1 to 2 HHA. Patient will benefit from continued skilled PT interventions to address impairments and progress independence with mobility, recommending HHPT follow up with 24/7 assist at this time. Acute PT will follow and progress as able.     Follow Up Recommendations Home health PT;Supervision/Assistance - 24 hour(will continue to assess and plan with family)    Equipment Recommendations  None recommended by PT    Recommendations for Other Services       Precautions / Restrictions Precautions Precautions: Fall Restrictions Weight Bearing Restrictions: No      Mobility  Bed Mobility Overal bed mobility: Needs Assistance Bed Mobility: Supine to Sit     Supine to sit: Min assist;HOB elevated     General bed mobility comments: pt impulsive with mobility attempting to get out of bed on opposite side as instructed. pt required  verbal/visual cues for sequencing roll to Lt and min assist to raise trunk up due to chest pain.  Transfers Overall transfer level: Needs assistance Equipment used: 2 person hand held assist;1 person hand held assist Transfers: Stand Pivot Transfers;Sit to/from Stand Sit to Stand: Min assist Stand pivot transfers: +2 safety/equipment;Min assist       General transfer comment: upon sitting up at EOB pt quickly attempted to lean forward to [facility] with poor safety awareness and poor planning for transfers. Pt required repeated verbal/tactile cues to redirect attention and improve safety with hand placement for power up. Min assist required for sit<>stand from EOB with 1HHA. min assist for stand pivot transfer to [facility] with verbal/tactile cues for safe reach back and slow lowering.  Ambulation/Gait Ambulation/Gait assistance: Min assist;+2 safety/equipment Gait Distance (Feet): 6 Feet Assistive device: 2 person hand held assist Gait Pattern/deviations: Step-to pattern;Decreased stride length;Narrow base of support Gait velocity: decreased   General Gait Details: pt requried [facility] for safety to take several steps from [facility] to recliner. HR max reached 141 bpm during session and pt going in and out of A-fib throughout session.  Stairs            Wheelchair Mobility    Modified Rankin (Stroke Patients Only)       Balance Overall balance assessment: Needs assistance Sitting-balance support: Feet supported Sitting balance-Leahy Scale: Fair     Standing balance support: During functional activity;Single extremity supported;Bilateral upper extremity supported Standing balance-Leahy Scale: Poor          Pertinent Vitals/Pain  Pain Assessment: Faces Faces Pain Scale: Hurts even more Pain Location: chest Pain Descriptors / Indicators: Sore;Grimacing Pain Intervention(s): Monitored during session;Repositioned;Limited activity within patient's tolerance    Home Living Family/patient  expects to be discharged to:: Private residence Living Arrangements: Spouse/significant other Available Help at Discharge: Family(pt's husband available, her daughter Oley Balm live close by as well, one son lives in [place]) Type of Home: House Home Access: Level entry     Home Layout: One level Home Equipment: Environmental consultant - 2 wheels;Cane - single point      Prior Function Level of Independence: Independent         Comments: pt has been very active and walks ~ 2 miles every morning with no device. she enjoys gardening and her and her husband both take care of the yard and cooking/cleaning. she is independent for all ADL's.     Hand Dominance        Extremity/Trunk Assessment   Upper Extremity Assessment Upper Extremity Assessment: Generalized weakness    Lower Extremity Assessment Lower Extremity Assessment: Generalized weakness    Cervical / Trunk Assessment Cervical / Trunk Assessment: Normal  Communication   Communication: No difficulties  Cognition Arousal/Alertness: Awake/alert Behavior During Therapy: Impulsive Overall Cognitive Status: Impaired/Different from baseline Area of Impairment: Memory;Safety/judgement;Problem solving;Following commands;Orientation        Orientation Level: (slight impairments but pt able to correct them with cues)   Memory: Decreased short-term memory Following Commands: Follows multi-step commands inconsistently;Follows one step commands inconsistently;Follows one step commands with increased time Safety/Judgement: Decreased awareness of deficits;Decreased awareness of safety   Problem Solving: Requires verbal cues;Slow processing General Comments: pt initially getting her birthdate year wrong but then correcting when questioned again, she was able to get place and situation accurate. Pt stated it was Monday and stated year is 2010. pt impulsive with mobility.      General Comments      Exercises     Assessment/Plan    PT  Assessment Patient needs continued PT services  PT Problem List Decreased safety awareness;Decreased strength;Decreased balance;Decreased cognition;Decreased knowledge of use of DME;Decreased mobility;Decreased range of motion;Decreased activity tolerance       PT Treatment Interventions DME instruction;Functional mobility training;Balance training;Patient/family education;Therapeutic activities;Gait training;Stair training;Therapeutic exercise    PT Goals (Current goals can be found in the Care Plan section)  Acute Rehab PT Goals Patient Stated Goal: to get back to walking every day and get the garden planted PT Goal Formulation: With patient/family Time For Goal Achievement: 07/14/19 Potential to Achieve Goals: Good    Frequency Min 4X/week    AM-PAC PT "6 Clicks" Mobility  Outcome Measure Help needed turning from your back to your side while in a flat bed without using bedrails?: A Little Help needed moving from lying on your back to sitting on the side of a flat bed without using bedrails?: A Little Help needed moving to and from a bed to a chair (including a wheelchair)?: A Little Help needed standing up from a chair using your arms (e.g., wheelchair or bedside chair)?: A Little Help needed to walk in hospital room?: A Little Help needed climbing 3-5 steps with a railing? : A Lot 6 Click Score: 17    End of Session   Activity Tolerance: Patient tolerated treatment well Patient left: with call bell/phone within reach;in chair;with chair alarm set Nurse Communication: Mobility status PT Visit Diagnosis: Muscle weakness (generalized) (M62.81);Difficulty in walking, not elsewhere classified (R26.2);Unsteadiness on feet (R26.81)    Time: 2831-5176  PT Time Calculation (min) (ACUTE ONLY): 22 min   Charges:   PT Evaluation $PT Eval Low Complexity: 1 Low          Verner Mould, DPT Physical Therapist with [facility] [phone]  06/30/2019 3:15  PM

## 2019-06-30 NOTE — Progress Notes (Signed)
NAME:  Courtney Chan, MRN:  [id number], DOB:  15-Sep-1930, LOS: 2 ADMISSION DATE:  06/28/2019, CONSULTATION DATE:  06/30/19 REFERRING MD:  Herbie Baltimore, CHIEF COMPLAINT:  NSTEMI   Brief History   86 year old female with history of hyperlipidemia, atrial fibrillation on Pradaxa, CKD, hypothyroidism who presented with chest pain and suffered NSTEMI in V. fib arrest in the ED.  ROSC achieved and taken to the Cath Lab for L circumflex stenting.  Intubated and transferred to [facility] due to lack of ICU beds at [facility].  History of present illness   86 year old female with history of hyperlipidemia, atrial fibrillation, CKD, hypothyroidism who presented to [facility] emergency department with 1 day of chest pain and had V. fib arrest in the ED with approximately 5 minutes ACLS and defibrillation and ROSC.  Found to have NSTEMI with elevated troponin and new RBBB and was taken to the Cath Lab for urgent cardiac catheterization and stenting of the L circumflex.  She was intubated and sedated though with improving neurologic exam and the decision was made not to cool.   She was transferred to [facility] due to lack of ICU bed at [facility].  On arrival she is hemodynamically stable off pressors; opening eyes to voice and following commands on minimal vent settings.  PCCM asked to consult alongside cardiology.  Past Medical History   has a past medical history of Atrial fibrillation (HCC) (2011), Hyperlipidemia, Hypertension, Stroke [facility]), and Thyroid disease.  Significant Hospital Events   2/14 presented to [facility], status post Cath Lab there and transferred to [facility]  Consults:  CCM  Procedures:  2/14-ETT 2/14-cardiac catheterization  Significant Diagnostic Tests:  2/14 CT head>> no acute findings 2/14 CXR>>central pulmonary vascular congestion and RIGHT suprahilar pulmonary edema  Micro Data:  2/14 SARS-Cov-2 and influenza>> negative  Antimicrobials:    Interim  history/subjective:  Episode of unresponsiveness yesterday. CT negative and no evidence of seizure on EEG. Today awake and interactive.  Objective   Blood pressure 128/64, pulse 90, temperature (!) 97.3 F (36.3 C), temperature source Oral, resp. rate 16, height 5\' 4"  (1.626 m), weight 71.5 kg, SpO2 98 %.        Intake/Output Summary (Last 24 hours) at 06/30/2019 1452 Last data filed at 06/30/2019 1400 Gross per 24 hour  Intake 695.52 ml  Output 475 ml  Net 220.52 ml   Filed Weights   06/28/19 1948 06/30/19 0600  Weight: 71.3 kg 71.5 kg    General appearance: 86 y.o., female extubated. Eyes: anicteric sclerae, moist conjunctivae; no lid-lag; PERRLA, tracking appropriately HENT: NCAT; oropharynx, MMM, no mucosal ulcerations; normal hard and soft palate Neck: Trachea midline; FROM, supple, lymphadenopathy, no JVD,  Lungs:chest clear. CV: RRR, S1, S2, no MRGs  Abdomen: Soft, non-tender; non-distended Extremities: No peripheral edema, radial and DP pulses present bilaterally  Skin: Normal temperature, turgor and texture; no rash Psych: Appropriate affect Neuro: Alert to voice, moves all 4 ext, follows commands and conversation appropriate.   Resolved Hospital Problem list     Assessment & Plan:   Acute episode of unresponsiveness of unclear etiology, possibly due to small CVA seen on MRI Appears near baseline. - No actionable pathology on CT or EEG. - Medical management for CVA - continue secondary prevention. Needs eventual anticoagulation for AF.  NSTEMI with V. Fib arrest s/p ROSC and urgent cardiac catheterization and PCI, baseline paroxysmal atrial fibrillation. Echo normal. -Continue anticoagulation per cardiology -Atrial fibrillation rate controlled on home dose of digoxin  plus metoprolol -Patient awake alert following commands this morning post cardiac arrest.  Was critically ill due to acute hypoxic respiratory failure secondary to cardiac arrest Successfully  extubated.  - progressive ambulation.  History of CKD -Creatinine 1.05 near baseline P: -Continue to monitor urine output  History of hypothyroidism -Remains on levothyroxine at home dose.   Best practice:  Diet: N.p.o. Pain/Anxiety/Delirium protocol (if indicated): Fentanyl drip VAP protocol (if indicated): HOB 30 degrees, daily wake-up assessment DVT prophylaxis: Per primary GI prophylaxis: Protonix Glucose control: SSI Mobility: Bedrest Code Status: Full code Family Communication: Per primary Disposition: Ready for transfer.   Labs   CBC: Recent Labs  Lab 06/28/19 1302 06/28/19 2051 06/29/19 0550 06/30/19 0215  WBC 8.0  --  10.9* 9.7  HGB 12.7 11.2* 10.9* 10.4*  HCT 40.3 33.0* 35.8* 34.5*  MCV 94.8  --  98.4 99.4  PLT 386  --  338 956    Basic Metabolic Panel: Recent Labs  Lab 06/28/19 1302 06/28/19 2051 06/29/19 0550 06/29/19 1200 06/30/19 0215  NA 141 140 141 142 143  K 4.4 4.9 4.5 4.4 4.0  CL 108  --  109 109 114*  CO2 24  --  20* 23 22  GLUCOSE 91  --  120* 115* 106*  BUN 19  --  18 18 18   CREATININE 1.05*  --  1.18* 1.24* 1.05*  CALCIUM 9.2  --  8.3* 8.4* 8.2*   GFR: Estimated Creatinine Clearance: 37.3 mL/min (A) (by C-G formula based on SCr of 1.05 mg/dL (H)). Recent Labs  Lab 06/28/19 1302 06/29/19 0550 06/30/19 0215  WBC 8.0 10.9* 9.7    Liver Function Tests: Recent Labs  Lab 06/29/19 1200  AST 70*  ALT 29  ALKPHOS 71  BILITOT 0.8  PROT 5.4*  ALBUMIN 2.9*   No results for input(s): LIPASE, AMYLASE in the last 168 hours. No results for input(s): AMMONIA in the last 168 hours.  ABG    Component Value Date/Time   PHART 7.388 06/28/2019 2051   PCO2ART 35.3 06/28/2019 2051   PO2ART 88.0 06/28/2019 2051   HCO3 21.4 06/28/2019 2051   TCO2 22 06/28/2019 2051   ACIDBASEDEF 3.0 (H) 06/28/2019 2051   O2SAT 97.0 06/28/2019 2051     Coagulation Profile: Recent Labs  Lab 06/28/19 1522  INR 2.3*    Cardiac Enzymes: No  results for input(s): CKTOTAL, CKMB, CKMBINDEX, TROPONINI in the last 168 hours.  HbA1C: Hgb A1c MFr Bld  Date/Time Value Ref Range Status  06/28/2019 10:16 PM 6.1 (H) 4.8 - 5.6 % Final    Comment:    (NOTE) Pre diabetes:          5.7%-6.4% Diabetes:              >6.4% Glycemic control for   <7.0% adults with diabetes     CBG: Recent Labs  Lab 06/29/19 1547 06/29/19 1936 06/29/19 2332 06/30/19 0340 06/30/19 0758  GLUCAP 94 93 93 95 90    This patient is critically ill with multiple organ system failure; which, requires frequent high complexity decision making, assessment, support, evaluation, and titration of therapies. This was completed through the application of advanced monitoring technologies and extensive interpretation of multiple databases. During this encounter critical care time was devoted to patient care services described in this note for 32 minutes.  Kipp Brood, DO [facility] 06/30/2019 2:52 PM

## 2019-06-30 NOTE — Progress Notes (Signed)
STROKE TEAM PROGRESS NOTE   INTERVAL HISTORY Her husband and son are at the bedside. She is sitting up in the bed awake, alert, talkative.  She states she has no significant deficits from the stroke and is able to give a clear cohesive history of what happened.  Vitals:   06/30/19 0600 06/30/19 0700 06/30/19 0800 06/30/19 0801  BP: 123/76 118/61 (!) 133/115   Pulse: (!) 102 (!) 105 (!) 108   Resp: 16 15 18    Temp:    (!) 97.3 F (36.3 C)  TempSrc:    Oral  SpO2: 98% 99% 98%   Weight: 71.5 kg     Height:        CBC:  Recent Labs  Lab 06/29/19 0550 06/30/19 0215  WBC 10.9* 9.7  HGB 10.9* 10.4*  HCT 35.8* 34.5*  MCV 98.4 99.4  PLT 338 289    Basic Metabolic Panel:  Recent Labs  Lab 06/29/19 1200 06/30/19 0215  NA 142 143  K 4.4 4.0  CL 109 114*  CO2 23 22  GLUCOSE 115* 106*  BUN 18 18  CREATININE 1.24* 1.05*  CALCIUM 8.4* 8.2*   Lipid Panel: No results found for: CHOL, TRIG, HDL, CHOLHDL, VLDL, LDLCALC  HgbA1c:  Lab Results  Component Value Date   HGBA1C 6.1 (H) 06/28/2019   IMAGING past 48 hours CT ANGIO HEAD W OR WO CONTRAST  Result Date: 06/29/2019 CLINICAL DATA:  Stroke follow-up EXAM: CT ANGIOGRAPHY HEAD AND NECK TECHNIQUE: Multidetector CT imaging of the head and neck was performed using the standard protocol during bolus administration of intravenous contrast. Multiplanar CT image reconstructions and MIPs were obtained to evaluate the vascular anatomy. Carotid stenosis measurements (when applicable) are obtained utilizing NASCET criteria, using the distal internal carotid diameter as the denominator. CONTRAST:  Dose is currently not available COMPARISON:  None. FINDINGS: CTA NECK FINDINGS Aortic arch: Atherosclerotic plaque.  Two vessel branching. Right carotid system: Mild to moderate mainly calcified plaque at the bifurcation/bulb. No flow limiting stenosis or ulceration. Left carotid system: Atheromatous plaque of the distal common carotid which is mild  for age. No flow limiting stenosis or ulceration. Vertebral arteries: Proximal subclavian atherosclerosis on the left more than right without flow limiting stenosis or ulceration. Codominant vertebral arteries that show robust flow to the dura. Skeleton: Degenerative changes without acute or aggressive finding. Other neck: No evidence of mass or inflammation Upper chest: Ground-glass and streaky opacity in the right more than left lung. No opacification at the left atrial appendage. Review of the MIP images confirms the above findings CTA HEAD FINDINGS Anterior circulation: Atherosclerotic plaque on the carotid siphons. No branch occlusion or flow limiting stenosis. Negative for aneurysm Posterior circulation: Vertebral and basilar arteries are smooth and widely patent. No branch occlusion or aneurysm. Venous sinuses: Unremarkable Anatomic variants: None significant Review of the MIP images confirms the above findings IMPRESSION: 1. No emergent vascular finding 2. Atherosclerosis without flow limiting stenosis or ulceration in the head and neck. 3. Non opacified left atrial appendage in the setting of atrial fibrillation that could be delayed filling versus thrombus. 4. Right more than left pneumonia and atelectasis. Electronically Signed   By: Marnee Spring M.D.   On: 06/29/2019 10:29   DG Chest 2 View  Result Date: 06/28/2019 CLINICAL DATA:  Chest pain shortness of breath EXAM: CHEST - 2 VIEW COMPARISON:  August 25, 2017 FINDINGS: The mediastinal contour and cardiac silhouette are normal. Mild opacity of left lung base is identified  at least in part due to atelectasis but superimposed early pneumonia is not excluded. There is no pulmonary edema or pleural effusion. The bony structures are stable. IMPRESSION: Mild opacity of left lung base at least in part due to atelectasis but superimposed early pneumonia is not excluded. Electronically Signed   By: Abelardo Diesel M.D.   On: 06/28/2019 14:41   DG Abd 1  View  Result Date: 06/29/2019 CLINICAL DATA:  NG tube placement. EXAM: ABDOMEN - 1 VIEW COMPARISON:  06/28/2019 FINDINGS: The tip of the NG tube is in the midbody of the stomach. The side hole is near the gastroesophageal junction. Bowel gas pattern is normal. Contrast is seen in the nondistended renal collecting systems. IMPRESSION: NG tube tip in the body of the stomach. Electronically Signed   By: Lorriane Shire M.D.   On: 06/29/2019 14:59   CT Head Wo Contrast  Result Date: 06/28/2019 CLINICAL DATA:  Cardiac arrest, unresponsive and pulseless upon arrival to ED, intubated EXAM: CT HEAD WITHOUT CONTRAST TECHNIQUE: Contiguous axial images were obtained from the base of the skull through the vertex without intravenous contrast. COMPARISON:  06/28/2019 FINDINGS: Brain: Chronic small vessel ischemic changes are seen within the bilateral frontal periventricular white matter and left external capsule. No acute infarct or hemorrhage. Lateral ventricles and remaining midline structures are unremarkable. There are no acute extra-axial fluid collections. There is no mass effect. Vascular: No hyperdense vessel or unexpected calcification. Skull: Normal. Negative for fracture or focal lesion. Sinuses/Orbits: No acute finding. Other: None IMPRESSION: 1. Stable head CT, no acute intracranial process. Electronically Signed   By: Randa Ngo M.D.   On: 06/28/2019 15:57   CT ANGIO NECK W OR WO CONTRAST  Result Date: 06/29/2019 CLINICAL DATA:  Stroke follow-up EXAM: CT ANGIOGRAPHY HEAD AND NECK TECHNIQUE: Multidetector CT imaging of the head and neck was performed using the standard protocol during bolus administration of intravenous contrast. Multiplanar CT image reconstructions and MIPs were obtained to evaluate the vascular anatomy. Carotid stenosis measurements (when applicable) are obtained utilizing NASCET criteria, using the distal internal carotid diameter as the denominator. CONTRAST:  Dose is currently not  available COMPARISON:  None. FINDINGS: CTA NECK FINDINGS Aortic arch: Atherosclerotic plaque.  Two vessel branching. Right carotid system: Mild to moderate mainly calcified plaque at the bifurcation/bulb. No flow limiting stenosis or ulceration. Left carotid system: Atheromatous plaque of the distal common carotid which is mild for age. No flow limiting stenosis or ulceration. Vertebral arteries: Proximal subclavian atherosclerosis on the left more than right without flow limiting stenosis or ulceration. Codominant vertebral arteries that show robust flow to the dura. Skeleton: Degenerative changes without acute or aggressive finding. Other neck: No evidence of mass or inflammation Upper chest: Ground-glass and streaky opacity in the right more than left lung. No opacification at the left atrial appendage. Review of the MIP images confirms the above findings CTA HEAD FINDINGS Anterior circulation: Atherosclerotic plaque on the carotid siphons. No branch occlusion or flow limiting stenosis. Negative for aneurysm Posterior circulation: Vertebral and basilar arteries are smooth and widely patent. No branch occlusion or aneurysm. Venous sinuses: Unremarkable Anatomic variants: None significant Review of the MIP images confirms the above findings IMPRESSION: 1. No emergent vascular finding 2. Atherosclerosis without flow limiting stenosis or ulceration in the head and neck. 3. Non opacified left atrial appendage in the setting of atrial fibrillation that could be delayed filling versus thrombus. 4. Right more than left pneumonia and atelectasis. Electronically Signed   By:  Marnee SpringJonathon  Watts M.D.   On: 06/29/2019 10:29   CARDIAC CATHETERIZATION  Result Date: 06/28/2019  Previously placed Prox Cx to Mid Cx stent (unknown type) is widely patent -the lesion began just distal to the stent  CULPRIT LESION: Mid Cx lesion is 95% stenosed.  A drug-eluting stent was successfully placed in order to overlap the previously  placed stent, using a STENT RESOLUTE ONYX 2.0X15. Postdilated to 2.3 mm - 2.1 mm  Post intervention, there is a 0% residual stenosis.  -----------------------------------  Ost RCA to Prox RCA lesion is 30% stenosed. Previously placed Prox RCA to Mid RCA stent (unknown type) is widely patent.  Mid LAD lesion is 30% stenosed.  -----------------------------------  There is moderate to severe left ventricular systolic dysfunction. The Left Ventricular Ejection Fraction is 25-35% by visual estimate.  LV end diastolic pressure is moderately elevated. There is mild (2+) mitral regurgitation.  SUMMARY  Severe single-vessel CAD with 95% stenosis in mid-distal LCx beyond previously placed stent  Successful DES PCI of LCx with stent overlapping proximal stent (resolute Onyx DES 2.0 mm x 15 mm--2.3 mm)  Otherwise mild to moderate 30% ostial and proximal RCA and mid LAD stenosis.  ACUTE COMBINED SYSTOLIC AND DIASTOLIC HEART FAILURE: Severely reduced LVEF with Takotsubo pattern, EF estimated 25 to 30%; Moderately elevated LVEDP of 20 mmHg  Persistent atrial fibrillation, rates notably improved following PCI  Borderline hypotension with improved blood pressures following PCI-no evidence of Cardiogenic Shock  Resolution of Right Bundle Branch Block  Evidence of neurologic recovery with the patient opening eyes and following commands while intubated. ->  Follows finger with eyes, squeezes hands. RECOMMENDATIONS  Based on bed availability, [facility]2 [facility]  With A. fib, will start IV heparin 8 hours after sheath removal and convert back to Pradaxa in the morning  Have ordered 2D echocardiogram, suspect reduced EF is partly related to stress cardiomyopathy from cardiac arrest as the reduced EF is well at proportion with her coronary disease.  Unclear if the patient has permanent A. fib versus paroxysmal, did not convert with defibrillation -> was on digoxin and  high-dose metoprolol prior to arrival, neither have been ordered until the patient's blood pressure stabilized, would probably start with low-dose metoprolol in the morning.  Have not ordered other antihypertensives due to borderline blood pressures. [facility] and upon discharge will be returned to the care of Dr. Jamse MeadAlex Paraschos. Bryan Lemmaavid Harding, MD  DG Chest Port 1 View  Result Date: 06/30/2019 CLINICAL DATA:  Tachypnea.  Recent stent placement EXAM: PORTABLE CHEST 1 VIEW COMPARISON:  Two days ago FINDINGS: The endotracheal tube is no longer seen. Lung volumes are lower and there is increased diffuse interstitial opacity. The enteric tube reaches the stomach. Stable heart size and mediastinal contours when allowing for overlapping defibrillator pad. No visible effusion or pneumothorax IMPRESSION: Low volume chest with increased interstitial opacity, likely edema and atelectasis. Electronically Signed   By: Marnee SpringJonathon  Watts M.D.   On: 06/30/2019 05:39   DG CHEST PORT 1 VIEW  Result Date: 06/28/2019 CLINICAL DATA:  Post cardiac arrest earlier today. EXAM: PORTABLE CHEST 1 VIEW COMPARISON:  Chest x-rays from earlier same day. FINDINGS: Endotracheal tube is well positioned with tip just above the level of the carina. Enteric tube passes below the diaphragm. Pacer pads overlie the heart. There is persistent central pulmonary vascular congestion and RIGHT suprahilar edema. Probable  atelectasis at the LEFT lung base. No pleural effusion or pneumothorax is seen. IMPRESSION: 1. No significant change compared to today's earlier chest x-ray. Persistent central pulmonary vascular congestion and RIGHT suprahilar pulmonary edema. No pleural effusion or pneumothorax. 2. Endotracheal tube well positioned with tip just above the level of the carina. Electronically Signed   By: Bary RichardStan  Maynard M.D.   On: 06/28/2019 20:53   DG Chest Portable 1 View  Result Date:  06/28/2019 CLINICAL DATA:  Status post arrest, intubated EXAM: PORTABLE CHEST 1 VIEW COMPARISON:  06/28/2019 FINDINGS: Single frontal view of the chest demonstrates defibrillator pads overlying left chest. Endotracheal tube overlies tracheal air column, tip just below thoracic inlet. Enteric catheter passes below diaphragm, tip excluded by collimation. Numerous cardiac leads overlie the chest. Cardiac silhouette is stable. Since the prior exam, diffuse interstitial and ground-glass opacities have developed consistent with pulmonary edema. Small left pleural effusion is noted. There is no pneumothorax on this supine exam. No acute displaced fractures. IMPRESSION: 1. Support devices as above.  No evidence of malpositioning. 2. Interval development of mild pulmonary edema. Electronically Signed   By: Sharlet SalinaMichael  Brown M.D.   On: 06/28/2019 15:13   DG Abd Portable 1 View  Result Date: 06/28/2019 CLINICAL DATA:  Intubated, enteric catheter placement, arrest EXAM: PORTABLE ABDOMEN - 1 VIEW COMPARISON:  None. FINDINGS: Frontal view of the lower chest and upper abdomen demonstrates enteric catheter tip and side port projecting over gastric body. Numerous cardiac leads are identified. Defibrillator pads overlie the left chest. Bowel gas pattern is unremarkable. Interstitial and ground-glass opacities and small left pleural effusion consistent with pulmonary edema. IMPRESSION: 1. Enteric catheter overlying gastric body. 2. Pulmonary edema. Electronically Signed   By: Sharlet SalinaMichael  Brown M.D.   On: 06/28/2019 15:14   EEG adult  Result Date: 06/29/2019 Charlsie QuestYadav, Priyanka O, MD     06/29/2019  2:00 PM Patient Name: Courtney MoatCamille J Chan MRN: [id number] Epilepsy Attending: Charlsie QuestPriyanka O Yadav Referring Physician/Provider: Dr. Marvel PlanJindong Xu Date: 06/29/2019 Duration: 25.15 minutes Patient history: 86 year old female with history of prior stroke now status post cardiac arrest and seizure-like activity.  EEG to evaluate for seizures. Level of  alertness: Comatose AEDs during EEG study: Keppra, lorazepam Technical aspects: This EEG study was done with scalp electrodes positioned according to the 10-20 International system of electrode placement. Electrical activity was acquired at a sampling rate of 500Hz  and reviewed with a high frequency filter of 70Hz  and a low frequency filter of 1Hz . EEG data were recorded continuously and digitally stored. Description: EEG showed continuous generalized low amplitude 2 to 3 Hz delta slowing as well as intermittent 5 to 6 Hz generalized theta slowing.  EEG was reactive to noxious stimuli.  Hyperventilation and photic stimulation were not performed. Abnormality -Continuous slow, generalized IMPRESSION: This study is suggestive of severe diffuse encephalopathy, nonspecific etiology. No seizures or epileptiform discharges were seen throughout the recording. Charlsie Questriyanka O Yadav   ECHOCARDIOGRAM COMPLETE  Result Date: 06/29/2019    ECHOCARDIOGRAM REPORT   Patient Name:   Courtney MoatCAMILLE J Finck Date of Exam: 06/29/2019 Medical Rec #:  [id number]        Height:       64.0 in Accession #:    [id number]       Weight:       157.2 lb Date of Birth:  07/01/1930         BSA:          1.77 m Patient Age:    [age]  years         BP:           95/52 mmHg Patient Gender: F                HR:           86 bpm. Exam Location:  Inpatient Procedure: 2D Echo Indications:    Acute myocardial infarction 410  History:        Patient has no prior history of Echocardiogram examinations.                 CAD, Stroke, Arrythmias:Atrial Fibrillation; Risk                 Factors:Hypertension and Dyslipidemia. Chronic kidney disease.                 V. fib arrest. Awake and altert while intubated, when extubated                 altered mental status.  Sonographer:    Leta Jungling RDCS Referring Phys: [id number] STEPHANIE FALK MARTIN IMPRESSIONS  1. Left ventricular ejection fraction, by estimation, is 55 to 60%. The left ventricle has normal function. The left  ventricle has no regional wall motion abnormalities. Left ventricular diastolic function could not be evaluated.  2. Right ventricular systolic function is normal. The right ventricular size is normal. There is mildly elevated pulmonary artery systolic pressure.  3. The mitral valve is degenerative. Mild mitral valve regurgitation. No evidence of mitral stenosis.  4. Tricuspid valve regurgitation is mild to moderate.  5. The aortic valve is tricuspid. Aortic valve regurgitation is not visualized. Mild to moderate aortic valve sclerosis/calcification is present, without any evidence of aortic stenosis. Comparison(s): No prior Echocardiogram. FINDINGS  Left Ventricle: Left ventricular ejection fraction, by estimation, is 55 to 60%. The left ventricle has normal function. The left ventricle has no regional wall motion abnormalities. There is no left ventricular hypertrophy. The left ventricular diastology could not be evaluated due to atrial fibrillation. Left ventricular diastolic function could not be evaluated. Right Ventricle: The right ventricular size is normal. No increase in right ventricular wall thickness. Right ventricular systolic function is normal. There is mildly elevated pulmonary artery systolic pressure. Left Atrium: Left atrial size was normal in size. Right Atrium: Right atrial size was normal in size. Pericardium: There is no evidence of pericardial effusion. Presence of pericardial fat pad. Mitral Valve: The mitral valve is degenerative in appearance. Severe mitral annular calcification. Mild mitral valve regurgitation. No evidence of mitral valve stenosis. Tricuspid Valve: The tricuspid valve is grossly normal. Tricuspid valve regurgitation is mild to moderate. Aortic Valve: The aortic valve is tricuspid. . There is moderate thickening and moderate calcification of the aortic valve. Aortic valve regurgitation is not visualized. Mild to moderate aortic valve sclerosis/calcification is present,  without any evidence of aortic stenosis. There is moderate thickening of the aortic valve. There is moderate calcification of the aortic valve. Pulmonic Valve: The pulmonic valve was grossly normal. Pulmonic valve regurgitation is trivial. Aorta: The aortic root and ascending aorta are structurally normal, with no evidence of dilitation. Venous: IVC assessment for right atrial pressure unable to be performed due to mechanical ventilation. IAS/Shunts: No atrial level shunt detected by color flow Doppler.  LEFT VENTRICLE PLAX 2D LVIDd:         3.23 cm  Diastology LVIDs:         1.70 cm  LV e' lateral:   5.84  cm/s LV PW:         1.11 cm  LV E/e' lateral: 18.2 LV IVS:        1.12 cm  LV e' medial:    6.20 cm/s LVOT diam:     1.80 cm  LV E/e' medial:  17.2 LV SV:         30.79 ml LV SV Index:   18.50 LVOT Area:     2.54 cm  RIGHT VENTRICLE RVOT diam:      2.20 cm TAPSE (M-mode): 1.0 cm LEFT ATRIUM             Index       RIGHT ATRIUM           Index LA diam:        3.30 cm 1.87 cm/m  RA Area:     20.50 cm LA Vol (A2C):   43.3 ml 24.52 ml/m RA Volume:   56.80 ml  32.16 ml/m LA Vol (A4C):   49.1 ml 27.80 ml/m LA Biplane Vol: 46.9 ml 26.56 ml/m  AORTIC VALVE LVOT Vmax:   64.30 cm/s LVOT Vmean:  40.800 cm/s LVOT VTI:    0.121 m  AORTA Ao Root diam: 2.70 cm Ao Asc diam:  2.80 cm MITRAL VALVE                TRICUSPID VALVE MV Area (PHT): 5.32 cm     TR Peak grad:   30.2 mmHg MV Decel Time: 143 msec     TR Vmax:        275.00 cm/s MV E velocity: 106.33 cm/s                             SHUNTS                             Systemic VTI:  0.12 m                             Systemic Diam: 1.80 cm                             Pulmonic Diam: 2.20 cm Lennie Odor MD Electronically signed by Lennie Odor MD Signature Date/Time: 06/29/2019/3:24:25 PM    Final    CT HEAD CODE STROKE WO CONTRAST  Result Date: 06/29/2019 CLINICAL DATA:  Code stroke.  Change in mental status EXAM: CT HEAD WITHOUT CONTRAST TECHNIQUE: Contiguous  axial images were obtained from the base of the skull through the vertex without intravenous contrast. COMPARISON:  Head CT from yesterday FINDINGS: Brain: No evidence of acute infarction, hemorrhage, hydrocephalus, extra-axial collection or mass lesion/mass effect. Remote right superior temporal cortically based infarct. Brain atrophy with ventriculomegaly. Chronic small vessel ischemia in the cerebral white matter. Vascular: No hyperdense vessel. Skull: No acute or aggressive finding Sinuses/Orbits: Negative Other: These results were communicated to Xu at 10:07 amon 2/15/2021by text page via the [facility] messaging system. ASPECTS [facility] Stroke Program Early CT Score) Not scored without localizing symptoms. IMPRESSION: 1. No acute finding. 2. Atrophy, chronic small vessel ischemia, and small remote right superior temporal infarct. Electronically Signed   By: Marnee Spring M.D.   On: 06/29/2019 10:08    PHYSICAL EXAM Pleasant elderly Caucasian lady sitting up comfortably in bed.  Not in  distress. . Afebrile. Head is nontraumatic. Neck is supple without bruit.    Cardiac exam no murmur or gallop. Lungs are clear to auscultation. Distal pulses are well felt. Neurological Exam ;  Awake  Alert oriented x 3. Normal speech and language.eye movements full without nystagmus.fundi were not visualized. Vision acuity and fields appear normal. Hearing is normal. Palatal movements are normal. Face symmetric. Tongue midline. Normal strength, tone, reflexes and coordination.  Mild diminished fine finger movements on the left.  Orbits right over left upper extremity.  Normal sensation. Gait deferred.  ASSESSMENT/PLAN Courtney Chan is a 86 y.o. female with history of afib, HLD, CKD admitted after CP and V. fib arrest in the ED with approximately 5 minutes ACLS and defibrillation and ROSC. Found to have NSTEMI and treated w/ PCI and on heparin IV. Following extubation she was normal. Later that morning, found to  have acute AMS, unresponsive, eyes closed, not following commands and nonverbal, loss of consciousness.   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 Small vessel disease. Old R superior temporal infarct. ASPECTS 10.     CTA head & neck no ELVO. Atherosclerosis. L atrial appendage. R>L PNA and atx.   MRI  Small L frontal gyrus infarct. Encephalomalacia and gliosis poster R insula into R temporal lobe. Small vessel disease. Atrophy.   2D Echo EF 55-60%. No source of embolus   LDL pending    HgbA1c 6.1  IV heparin for VTE prophylaxis    Diet   Diet NPO time specified    No antithrombotic prior to admission, now on clopidogrel 75 mg daily, heparin IV and aspirin 150 supp or asa 81 po. Recommend NOAC once able to take POs.  Therapy recommendations:  HH PT  Disposition:  pending   Possible seizure  Loaded w/ Keppra  EEG no sz  D/c Keppra  Atrial Fibrillation  Home anticoagulation:  none   Now on IV heparin . Recommend NOAC at d/c for secondary stroke prevention   Hypertension . Stable . Ok for normotensive BP goal from stroke standpoint   Hyperlipidemia  Home meds:  No statin  Now on lipitor 80  LDL pending, stroke goal < 70  Continue statin at discharge  Other Stroke Risk Factors  Advanced age  Coronary artery disease s/p NSTEMI w/ defibrillation followed by PCI   Other Active Problems  Hx CKD, stable now  Hypothyroidism on synthroid  HFrEF. EF 25-30% previously. Now 55-60%.  Hospital day # 2 Patient presented with MI and had a witnessed cardiac arrest and had altered mental status MRI scan shows a small embolic left frontal infarct likely from A. fib and MI and she is currently on IV heparin drip.  Recommend start oral anticoagulation when she is able to swallow safely.  Maintain aggressive  risk factor modification.  Long discussion with patient, husband and son at the bedside and answered questions.  Greater than 50% time during this 35-minute visit was spent in counseling and coordination of care about her embolic stroke and answering questions. Delia Heady, MD To contact Stroke Continuity provider, please refer to [email or web address]. After hours, contact General Neurology

## 2019-06-30 NOTE — Progress Notes (Signed)
eLink Physician-Brief Progress Note Patient Name: Courtney Chan DOB: Feb 14, 1931 MRN: [id number]   Date of Service  06/30/2019  HPI/Events of Note  Notified that patient is slightly more tachypneic  and in chronic afib. Oliguirc  eICU Interventions  CXR ordered     Intervention Category Major Interventions: Arrhythmia - evaluation and management Intermediate Interventions: Respiratory distress - evaluation and management  Rosalie Gums Keara Pagliarulo 06/30/2019, 4:30 AM

## 2019-06-30 NOTE — H&P (Signed)
See consult note from [facility] as full H&P.  Bryan Lemma, MD

## 2019-06-30 NOTE — Progress Notes (Signed)
ANTICOAGULATION CONSULT NOTE  Consult  Pharmacy Consult for Heparin  Indication: Atrial fibrillation  Allergies  Allergen Reactions  . Metronidazole Diarrhea and Nausea And Vomiting  . Oxycodone Other (See Comments)    Altered mental status    Patient Measurements: Height: 5\' 4"  (162.6 cm) Weight: 157 lb 10.1 oz (71.5 kg) IBW/kg (Calculated) : 54.7 Heparin Dosing Weight: 68 kg   Vital Signs: Temp: 97.3 F (36.3 C) (02/16 0801) Temp Source: Oral (02/16 0801) BP: 133/115 (02/16 0800) Pulse Rate: 108 (02/16 0800)  Labs: Recent Labs    06/28/19 1302 06/28/19 1302 06/28/19 1522 06/28/19 2051 06/28/19 2051 06/29/19 0550 06/29/19 1200 06/29/19 2012 06/30/19 0215  HGB 12.7   < >  --  11.2*   < > 10.9*  --   --  10.4*  HCT 40.3   < >  --  33.0*  --  35.8*  --   --  34.5*  PLT 386  --   --   --   --  338  --   --  289  APTT  --   --  66*  --   --   --   --   --   --   LABPROT  --   --  25.2*  --   --   --   --   --   --   INR  --   --  2.3*  --   --   --   --   --   --   HEPARINUNFRC  --   --   --   --   --   --   --  0.30 0.33  CREATININE 1.05*   < >  --   --   --  1.18* 1.24*  --  1.05*  TROPONINIHS 11,576*  --  13,408*  --   --   --   --   --   --    < > = values in this interval not displayed.    Estimated Creatinine Clearance: 37.3 mL/min (A) (by C-G formula based on SCr of 1.05 mg/dL (H)).  Assessment: 86 yo female with h/o Afib s/p LCx PCI 2/14, Pradaxa on hold - plan to resume closer to DC.  CVA/head bleed ruled out after having altered mental status post extubation. Will resume heparin, plavix, and aspirin 2/15. Heparin level this AM is therapeutic at 0.3, CBC stable no bleeding  Goal of Therapy:  Heparin level 0.3-0.7 units/ml Monitor platelets by anticoagulation protocol: Yes   Plan:  Continue heparin 800 units/hr Check heparin level with am labs.   3/15 Pharm.D. CPP, BCPS Clinical Pharmacist [phone] 06/30/2019 8:45 AM    Please see  AMION for all Pharmacists' Contact Phone Numbers 06/30/2019, 8:16 AM

## 2019-07-01 ENCOUNTER — Inpatient Hospital Stay (HOSPITAL_COMMUNITY): Payer: Medicare PPO

## 2019-07-01 DIAGNOSIS — I4819 Other persistent atrial fibrillation: Secondary | ICD-10-CM

## 2019-07-01 LAB — CBC
HCT: 38.4 % (ref 36.0–46.0)
Hemoglobin: 12 g/dL (ref 12.0–15.0)
MCH: 30.3 pg (ref 26.0–34.0)
MCHC: 31.3 g/dL (ref 30.0–36.0)
MCV: 97 fL (ref 80.0–100.0)
Platelets: 274 10*3/uL (ref 150–400)
RBC: 3.96 MIL/uL (ref 3.87–5.11)
RDW: 14.2 % (ref 11.5–15.5)
WBC: 10.4 10*3/uL (ref 4.0–10.5)
nRBC: 0 % (ref 0.0–0.2)

## 2019-07-01 LAB — LIPID PANEL
Cholesterol: 122 mg/dL (ref 0–200)
HDL: 35 mg/dL — ABNORMAL LOW (ref >40)
LDL Cholesterol: 61 mg/dL (ref 0–99)
Total CHOL/HDL Ratio: 3.5 RATIO
Triglycerides: 128 mg/dL (ref <150)
VLDL: 26 mg/dL (ref 0–40)

## 2019-07-01 LAB — BASIC METABOLIC PANEL
Anion gap: 14 (ref 5–15)
BUN: 18 mg/dL (ref 8–23)
CO2: 22 mmol/L (ref 22–32)
Calcium: 9 mg/dL (ref 8.9–10.3)
Chloride: 108 mmol/L (ref 98–111)
Creatinine, Ser: 1.02 mg/dL — ABNORMAL HIGH (ref 0.44–1.00)
GFR calc Af Amer: 58 mL/min — ABNORMAL LOW (ref >60)
GFR calc non Af Amer: 50 mL/min — ABNORMAL LOW (ref >60)
Glucose, Bld: 91 mg/dL (ref 70–99)
Potassium: 3.9 mmol/L (ref 3.5–5.1)
Sodium: 144 mmol/L (ref 135–145)

## 2019-07-01 LAB — HEPARIN LEVEL (UNFRACTIONATED): Heparin Unfractionated: 0.23 IU/mL — ABNORMAL LOW (ref 0.30–0.70)

## 2019-07-01 MED ORDER — FUROSEMIDE 10 MG/ML IJ SOLN
40.0000 mg | Freq: Once | INTRAMUSCULAR | Status: AC
  Administered 2019-07-01: 40 mg via INTRAVENOUS
  Filled 2019-07-01: qty 4

## 2019-07-01 MED ORDER — LEVOTHYROXINE SODIUM 88 MCG PO TABS
88.0000 ug | ORAL_TABLET | Freq: Every day | ORAL | Status: DC
  Administered 2019-07-02 – 2019-07-04 (×3): 88 ug via ORAL
  Filled 2019-07-01 (×3): qty 1

## 2019-07-01 MED ORDER — LOSARTAN POTASSIUM 25 MG PO TABS
25.0000 mg | ORAL_TABLET | Freq: Every day | ORAL | Status: DC
  Administered 2019-07-01 – 2019-07-03 (×3): 25 mg via ORAL
  Filled 2019-07-01 (×3): qty 1

## 2019-07-01 MED ORDER — ATORVASTATIN CALCIUM 80 MG PO TABS
80.0000 mg | ORAL_TABLET | Freq: Every day | ORAL | Status: DC
  Administered 2019-07-01 – 2019-07-03 (×3): 80 mg via ORAL
  Filled 2019-07-01 (×3): qty 1

## 2019-07-01 MED ORDER — LIDOCAINE 5 % EX PTCH
1.0000 | MEDICATED_PATCH | CUTANEOUS | Status: DC
  Administered 2019-07-01 – 2019-07-04 (×4): 1 via TRANSDERMAL
  Filled 2019-07-01 (×4): qty 1

## 2019-07-01 MED ORDER — ACETAMINOPHEN 160 MG/5ML PO SOLN
650.0000 mg | Freq: Four times a day (QID) | ORAL | Status: DC | PRN
  Administered 2019-07-01 – 2019-07-02 (×3): 650 mg via ORAL
  Filled 2019-07-01 (×5): qty 20.3

## 2019-07-01 MED ORDER — DABIGATRAN ETEXILATE MESYLATE 150 MG PO CAPS
150.0000 mg | ORAL_CAPSULE | Freq: Two times a day (BID) | ORAL | Status: DC
  Administered 2019-07-01 – 2019-07-04 (×7): 150 mg via ORAL
  Filled 2019-07-01 (×7): qty 1

## 2019-07-01 NOTE — Progress Notes (Signed)
STROKE TEAM PROGRESS NOTE   INTERVAL HISTORY Patient is sitting up in a bedside chair.  She is doing well.  She has no neurological complaints.  She is on IV heparin.  With plan to change to Pradaxa when she is able to swallow she pulled out NG tube last night.  She is awaiting swallow eval before reinsertion.  Vitals:   07/01/19 0424 07/01/19 0737 07/01/19 1036 07/01/19 1141  BP: 137/69 (!) 158/62 (!) 151/95 135/76  Pulse: 100 100 (!) 110 (!) 104  Resp: 18 18 18 18   Temp: (!) 97.3 F (36.3 C) 98 F (36.7 C)    TempSrc:      SpO2: 93% 91% 94% 95%  Weight: 69 kg     Height:        CBC:  Recent Labs  Lab 06/30/19 0215 07/01/19 0507  WBC 9.7 10.4  HGB 10.4* 12.0  HCT 34.5* 38.4  MCV 99.4 97.0  PLT 289 274    Basic Metabolic Panel:  Recent Labs  Lab 06/30/19 0215 07/01/19 0507  NA 143 144  K 4.0 3.9  CL 114* 108  CO2 22 22  GLUCOSE 106* 91  BUN 18 18  CREATININE 1.05* 1.02*  CALCIUM 8.2* 9.0   Lipid Panel:     Component Value Date/Time   CHOL 122 07/01/2019 0507   TRIG 128 07/01/2019 0507   HDL 35 (L) 07/01/2019 0507   CHOLHDL 3.5 07/01/2019 0507   VLDL 26 07/01/2019 0507   LDLCALC 61 07/01/2019 0507    HgbA1c:  Lab Results  Component Value Date   HGBA1C 6.1 (H) 06/28/2019   IMAGING past 48 hours DG Abd 1 View  Result Date: 06/29/2019 CLINICAL DATA:  NG tube placement. EXAM: ABDOMEN - 1 VIEW COMPARISON:  06/28/2019 FINDINGS: The tip of the NG tube is in the midbody of the stomach. The side hole is near the gastroesophageal junction. Bowel gas pattern is normal. Contrast is seen in the nondistended renal collecting systems. IMPRESSION: NG tube tip in the body of the stomach. Electronically Signed   By: 06/30/2019 M.D.   On: 06/29/2019 14:59   MR BRAIN WO CONTRAST  Result Date: 06/30/2019 CLINICAL DATA:  Stroke follow-up EXAM: MRI HEAD WITHOUT CONTRAST TECHNIQUE: Multiplanar, multiecho pulse sequences of the brain and surrounding structures were  obtained without intravenous contrast. COMPARISON:  Head CT June 28, 2019 FINDINGS: Brain: A focus of restricted diffusion is seen in the posterior aspect of the left superior frontal gyrus, consistent with an acute infarct. Area of encephalomalacia and gliosis is seen involving the posterior right insula extending into the right temporal lobe. Scattered and confluent foci of T2 hyperintensity are seen within the white matter of the cerebral hemispheres, nonspecific, most likely related to chronic small vessel ischemia. Prominence of the supratentorial ventricles, cerebral and cerebellar sulci reflecting parenchymal volume loss. No hemorrhage, extra-axial collection or mass lesion. Vascular: Normal flow voids. Skull and upper cervical spine: Normal marrow signal. Sinuses/Orbits: Bilateral lens surgery noted. Visualized sinuses are clear. Other: None. IMPRESSION: 1. Small acute infarct involving the posterior aspect of the left superior frontal gyrus. 2. Encephalomalacia and gliosis involving the posterior right insula extending into the right temporal lobe. 3. Chronic small vessel ischemia and parenchymal volume loss. Electronically Signed   By: June 30, 2019 M.D.   On: 06/30/2019 13:32   DG Chest Port 1 View  Result Date: 07/01/2019 CLINICAL DATA:  Shortness of breath.  CHF. EXAM: PORTABLE CHEST 1 VIEW  COMPARISON:  06/30/2019 FINDINGS: Normal heart size. Probable small bilateral pleural effusions. No pneumothorax. Extremely low lung volumes. Perihilar and basilar predominant interstitial and airspace disease is felt to be slightly improved. IMPRESSION: Minimal improvement in aeration. Interstitial and airspace disease, at least partially felt to represent interstitial edema and atelectasis. Infection at the lung bases cannot be excluded. Electronically Signed   By: Jeronimo Greaves M.D.   On: 07/01/2019 09:42   DG Chest Port 1 View  Result Date: 06/30/2019 CLINICAL DATA:  Tachypnea.  Recent  stent placement EXAM: PORTABLE CHEST 1 VIEW COMPARISON:  Two days ago FINDINGS: The endotracheal tube is no longer seen. Lung volumes are lower and there is increased diffuse interstitial opacity. The enteric tube reaches the stomach. Stable heart size and mediastinal contours when allowing for overlapping defibrillator pad. No visible effusion or pneumothorax IMPRESSION: Low volume chest with increased interstitial opacity, likely edema and atelectasis. Electronically Signed   By: Marnee Spring M.D.   On: 06/30/2019 05:39    PHYSICAL EXAM Pleasant elderly Caucasian lady sitting up comfortably in bed.  Not in distress. . Afebrile. Head is nontraumatic. Neck is supple without bruit.    Cardiac exam no murmur or gallop. Lungs are clear to auscultation. Distal pulses are well felt. Neurological Exam ;  Awake  Alert oriented x 3. Normal speech and language.eye movements full without nystagmus.fundi were not visualized. Vision acuity and fields appear normal. Hearing is normal. Palatal movements are normal. Face symmetric. Tongue midline. Normal strength, tone, reflexes and coordination.  Mild diminished fine finger movements on the left.  Orbits right over left upper extremity.  Normal sensation. Gait deferred.  ASSESSMENT/PLAN Courtney Chan is a 86 y.o. female with history of afib, HLD, CKD admitted after CP and V. fib arrest in the ED with approximately 5 minutes ACLS and defibrillation and ROSC. Found to have NSTEMI and treated w/ PCI and on heparin IV. Following extubation she was normal. Later that morning, found to have acute AMS, unresponsive, eyes closed, not following commands and nonverbal, loss of consciousness.   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 Small vessel disease. Old R superior temporal infarct. ASPECTS 10.     CTA  head & neck no ELVO. Atherosclerosis. L atrial appendage. R>L PNA and atx.   MRI  Small L frontal gyrus infarct. Encephalomalacia and gliosis poster R insula into R temporal lobe. Small vessel disease. Atrophy.   2D Echo EF 55-60%. No source of embolus   LDL pending    HgbA1c 6.1  IV heparin for VTE prophylaxis    Diet   Diet regular Room service appropriate? Yes; Fluid consistency: Thin    No antithrombotic prior to admission, now on clopidogrel 75 mg daily, heparin IV and aspirin 150 supp or asa 81 po. Recommend NOAC once able to take POs.  Therapy recommendations:  HH PT  Disposition:  pending   Possible seizure  Loaded w/ Keppra  EEG no sz  D/c Keppra  Atrial Fibrillation  Home anticoagulation:  none   Now on IV heparin . Recommend NOAC at d/c for secondary stroke prevention   Hypertension . Stable . Ok for normotensive BP goal from stroke standpoint   Hyperlipidemia  Home meds:  No statin  Now on lipitor 80  LDL pending, stroke goal < 70  Continue statin at discharge  Other Stroke Risk Factors  Advanced age  Coronary artery disease s/p NSTEMI w/ defibrillation followed by PCI   Other Active Problems  Hx CKD, stable now  Hypothyroidism on synthroid  HFrEF. EF 25-30% previously. Now 55-60%.  Hospital day # 3 Patient presented with MI and had a witnessed cardiac arrest and had altered mental status MRI scan shows a small embolic left frontal infarct likely from A. fib and MI and she is currently on IV heparin drip.  Recommend start oral anticoagulation when she is able to swallow safely.  Maintain aggressive risk factor modification.  Stroke team will sign off.  Kindly call for questions.  Antony Contras, MD To contact Stroke Continuity provider, please refer to [email or web address] After hours, contact General Neurology

## 2019-07-01 NOTE — Evaluation (Addendum)
Clinical/Bedside Swallow Evaluation Patient Details  Name: Courtney Chan MRN: [id number] Date of Birth: 08-18-1930  Today's Date: 07/01/2019 Time: SLP Start Time (ACUTE ONLY): 1050 SLP Stop Time (ACUTE ONLY): 1111 SLP Time Calculation (min) (ACUTE ONLY): 21 min  Past Medical History:  Past Medical History:  Diagnosis Date  . Atrial fibrillation (HCC) 2011  . Hyperlipidemia   . Hypertension   . Stroke (HCC)   . Thyroid disease    Past Surgical History:  Past Surgical History:  Procedure Laterality Date  . ABDOMINAL HYSTERECTOMY    . BREAST BIOPSY Left 1980   Negative  . CORONARY/GRAFT ACUTE MI REVASCULARIZATION N/A 06/28/2019   Procedure: Coronary/Graft Acute MI Revascularization;  Surgeon: Marykay Lex, MD;  Location: [facility] INVASIVE CV LAB;  Service: Cardiovascular;  Laterality: N/A;  . LEFT HEART CATH AND CORONARY ANGIOGRAPHY N/A 06/28/2019   Procedure: LEFT HEART CATH AND CORONARY ANGIOGRAPHY;  Surgeon: Marykay Lex, MD;  Location: [facility] INVASIVE CV LAB;  Service: Cardiovascular;  Laterality: N/A;   HPI:  Pt is an 86 year old female with history of dyslipidemia atrial fibrillation essential hypertension CKD, hypothyroidism, who reportedly started having chest pain day prior to admission. She was admitted in an unresponsive state and was found to have V. fib arrest status post ACLS with defibrillation and ROSC. Chest x-ray revealed minimal improvement in aeration. Interstitial and airspace disease, at least partially felt to represent interstitial edema and atelectasis. Infection at the lung bases cannot be excluded. EEG of 2/15: severe diffuse encephalopathy. Intubated 2/14-2/15; on 2/15 found unresponsive and code stroke was called. 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 Encephalomalacia and gliosis involving the posterior right insula extending into the right temporal lobe.   Assessment / Plan / Recommendation Clinical  Impression  Pt was seen for bedside swallow evaluation and she denied a history of dysphagia. Oral mechanism exam was within functional limits but lingual resistance to tongue depressor was mildly reduced. Dentition was adequate. She tolerated all solids and liquids without signs or symptoms of oropharyngeal dysphagia. A regular texture diet with thin liquids is recommended. SLP will follow for one additional session to ensure tolerance of the recommended diet.   SLP Visit Diagnosis: Dysphagia, unspecified (R13.10)    Aspiration Risk  No limitations    Diet Recommendation Regular;Thin liquid   Liquid Administration via: Straw;Cup Medication Administration: Whole meds with liquid Supervision: Patient able to self feed Postural Changes: Seated upright at 90 degrees    Other  Recommendations Oral Care Recommendations: Oral care BID   Follow up Recommendations None      Frequency and Duration (1 additional visit)  1 week       Prognosis Prognosis for Safe Diet Advancement: Good      Swallow Study   General Date of Onset: 06/30/19 HPI: Pt is an 86 year old female with history of dyslipidemia atrial fibrillation essential hypertension CKD, hypothyroidism, who reportedly started having chest pain day prior to admission. She was admitted in an unresponsive state and was found to have V. fib arrest status post ACLS with defibrillation and ROSC. Chest x-ray revealed minimal improvement in aeration. Interstitial and airspace disease, at least partially felt to represent interstitial edema and atelectasis. Infection at the lung bases cannot be excluded. EEG of 2/15: severe diffuse encephalopathy. Intubated 2/14-2/15; on 2/15 found unresponsive and code stroke was called. 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 Encephalomalacia and gliosis involving the posterior  right insula extending into the right temporal lobe. Type of Study: Bedside Swallow  Evaluation Previous Swallow Assessment: none Diet Prior to this Study: NPO Temperature Spikes Noted: No Respiratory Status: Nasal cannula History of Recent Intubation: Yes Length of Intubations (days): 2 days Date extubated: 06/29/19 Behavior/Cognition: Cooperative;Alert;Pleasant mood Oral Cavity Assessment: Within Functional Limits Oral Care Completed by SLP: No Vision: Functional for self-feeding Self-Feeding Abilities: Able to feed self Patient Positioning: Upright in bed;Upright in chair Baseline Vocal Quality: Normal Volitional Cough: Strong Volitional Swallow: Able to elicit    Oral/Motor/Sensory Function Overall Oral Motor/Sensory Function: Within functional limits   Ice Chips Ice chips: Within functional limits Presentation: Spoon   Thin Liquid Thin Liquid: Within functional limits Presentation: Cup;Spoon;Straw    Nectar Thick Nectar Thick Liquid: Not tested   Honey Thick Honey Thick Liquid: Not tested   Puree Puree: Within functional limits Presentation: Spoon   Solid     Solid: Within functional limits Presentation: [place] Negus, [place], [place] Office number [phone] Pager [phone]  [name] 07/01/2019,11:20 AM

## 2019-07-01 NOTE — Progress Notes (Addendum)
Progress Note  Patient Name: Courtney Chan Date of Encounter: 07/01/2019  Primary Cardiologist: [facility])  Subjective   Chest wall is sore. She has been coughing.   Inpatient Medications    Scheduled Meds: . aspirin  81 mg Per Tube Daily   Or  . aspirin  150 mg Rectal Daily  . atorvastatin  80 mg Per Tube q1800  . chlorhexidine  15 mL Mouth Rinse BID  . Chlorhexidine Gluconate Cloth  6 each Topical Daily  . clopidogrel  75 mg Per Tube Daily  . levothyroxine  88 mcg Per Tube Q0600  . lidocaine  1 patch Transdermal Q24H  . mouth rinse  15 mL Mouth Rinse q12n4p  . metoprolol tartrate  25 mg Oral BID   Continuous Infusions: . heparin 800 Units/hr (06/30/19 1228)   PRN Meds: acetaminophen (TYLENOL) oral liquid 160 mg/5 mL   Vital Signs    Vitals:   07/01/19 0127 07/01/19 0128 07/01/19 0424 07/01/19 0737  BP: 128/71 128/71 137/69 (!) 158/62  Pulse: 91 91 100 100  Resp: 20 20 18 18   Temp: 97.6 F (36.4 C) 97.6 F (36.4 C) (!) 97.3 F (36.3 C) 98 F (36.7 C)  TempSrc:      SpO2: 97% 97% 93% 91%  Weight:   69 kg   Height:        Intake/Output Summary (Last 24 hours) at 07/01/2019 0831 Last data filed at 07/01/2019 0432 Gross per 24 hour  Intake 157.66 ml  Output 600 ml  Net -442.34 ml   Last 3 Weights 07/01/2019 06/30/2019 06/28/2019  Weight (lbs) 152 lb 1.9 oz 157 lb 10.1 oz 157 lb 3 oz  Weight (kg) 69 kg 71.5 kg 71.3 kg      Telemetry    Atrial fib, rate 100s- Personally Reviewed  ECG    No AM EKG - Personally Reviewed  Physical Exam   GEN: No acute distress.   Neck: No JVD Cardiac: Irreg irreg. No murmurs, rubs, or gallops.  Respiratory: Clear to auscultation bilaterally. GI: Soft, nontender, non-distended  MS: No edema; No deformity. Neuro:  Nonfocal  Psych: Normal affect   Labs    High Sensitivity Troponin:   Recent Labs  Lab 06/28/19 1302 06/28/19 1522  TROPONINIHS 11,576* 13,408*      Chemistry Recent Labs    Lab 06/29/19 1200 06/30/19 0215 07/01/19 0507  NA 142 143 144  K 4.4 4.0 3.9  CL 109 114* 108  CO2 23 22 22   GLUCOSE 115* 106* 91  BUN 18 18 18   CREATININE 1.24* 1.05* 1.02*  CALCIUM 8.4* 8.2* 9.0  PROT 5.4*  --   --   ALBUMIN 2.9*  --   --   AST 70*  --   --   ALT 29  --   --   ALKPHOS 71  --   --   BILITOT 0.8  --   --   GFRNONAA 39* 48* 50*  GFRAA 46* 56* 58*  ANIONGAP 10 7 14      Hematology Recent Labs  Lab 06/29/19 0550 06/30/19 0215 07/01/19 0507  WBC 10.9* 9.7 10.4  RBC 3.64* 3.47* 3.96  HGB 10.9* 10.4* 12.0  HCT 35.8* 34.5* 38.4  MCV 98.4 99.4 97.0  MCH 29.9 30.0 30.3  MCHC 30.4 30.1 31.3  RDW 14.2 14.6 14.2  PLT 338 289 274    BNPNo results for input(s): BNP, PROBNP in the last 168 hours.   DDimer No results for  input(s): DDIMER in the last 168 hours.   Radiology    CT ANGIO HEAD W OR WO CONTRAST  Result Date: 06/29/2019 CLINICAL DATA:  Stroke follow-up EXAM: CT ANGIOGRAPHY HEAD AND NECK TECHNIQUE: Multidetector CT imaging of the head and neck was performed using the standard protocol during bolus administration of intravenous contrast. Multiplanar CT image reconstructions and MIPs were obtained to evaluate the vascular anatomy. Carotid stenosis measurements (when applicable) are obtained utilizing NASCET criteria, using the distal internal carotid diameter as the denominator. CONTRAST:  Dose is currently not available COMPARISON:  None. FINDINGS: CTA NECK FINDINGS Aortic arch: Atherosclerotic plaque.  Two vessel branching. Right carotid system: Mild to moderate mainly calcified plaque at the bifurcation/bulb. No flow limiting stenosis or ulceration. Left carotid system: Atheromatous plaque of the distal common carotid which is mild for age. No flow limiting stenosis or ulceration. Vertebral arteries: Proximal subclavian atherosclerosis on the left more than right without flow limiting stenosis or ulceration. Codominant vertebral arteries that show robust  flow to the dura. Skeleton: Degenerative changes without acute or aggressive finding. Other neck: No evidence of mass or inflammation Upper chest: Ground-glass and streaky opacity in the right more than left lung. No opacification at the left atrial appendage. Review of the MIP images confirms the above findings CTA HEAD FINDINGS Anterior circulation: Atherosclerotic plaque on the carotid siphons. No branch occlusion or flow limiting stenosis. Negative for aneurysm Posterior circulation: Vertebral and basilar arteries are smooth and widely patent. No branch occlusion or aneurysm. Venous sinuses: Unremarkable Anatomic variants: None significant Review of the MIP images confirms the above findings IMPRESSION: 1. No emergent vascular finding 2. Atherosclerosis without flow limiting stenosis or ulceration in the head and neck. 3. Non opacified left atrial appendage in the setting of atrial fibrillation that could be delayed filling versus thrombus. 4. Right more than left pneumonia and atelectasis. Electronically Signed   By: Marnee Spring M.D.   On: 06/29/2019 10:29   DG Abd 1 View  Result Date: 06/29/2019 CLINICAL DATA:  NG tube placement. EXAM: ABDOMEN - 1 VIEW COMPARISON:  06/28/2019 FINDINGS: The tip of the NG tube is in the midbody of the stomach. The side hole is near the gastroesophageal junction. Bowel gas pattern is normal. Contrast is seen in the nondistended renal collecting systems. IMPRESSION: NG tube tip in the body of the stomach. Electronically Signed   By: Francene Boyers M.D.   On: 06/29/2019 14:59   CT ANGIO NECK W OR WO CONTRAST  Result Date: 06/29/2019 CLINICAL DATA:  Stroke follow-up EXAM: CT ANGIOGRAPHY HEAD AND NECK TECHNIQUE: Multidetector CT imaging of the head and neck was performed using the standard protocol during bolus administration of intravenous contrast. Multiplanar CT image reconstructions and MIPs were obtained to evaluate the vascular anatomy. Carotid stenosis measurements  (when applicable) are obtained utilizing NASCET criteria, using the distal internal carotid diameter as the denominator. CONTRAST:  Dose is currently not available COMPARISON:  None. FINDINGS: CTA NECK FINDINGS Aortic arch: Atherosclerotic plaque.  Two vessel branching. Right carotid system: Mild to moderate mainly calcified plaque at the bifurcation/bulb. No flow limiting stenosis or ulceration. Left carotid system: Atheromatous plaque of the distal common carotid which is mild for age. No flow limiting stenosis or ulceration. Vertebral arteries: Proximal subclavian atherosclerosis on the left more than right without flow limiting stenosis or ulceration. Codominant vertebral arteries that show robust flow to the dura. Skeleton: Degenerative changes without acute or aggressive finding. Other neck: No evidence of mass or  inflammation Upper chest: Ground-glass and streaky opacity in the right more than left lung. No opacification at the left atrial appendage. Review of the MIP images confirms the above findings CTA HEAD FINDINGS Anterior circulation: Atherosclerotic plaque on the carotid siphons. No branch occlusion or flow limiting stenosis. Negative for aneurysm Posterior circulation: Vertebral and basilar arteries are smooth and widely patent. No branch occlusion or aneurysm. Venous sinuses: Unremarkable Anatomic variants: None significant Review of the MIP images confirms the above findings IMPRESSION: 1. No emergent vascular finding 2. Atherosclerosis without flow limiting stenosis or ulceration in the head and neck. 3. Non opacified left atrial appendage in the setting of atrial fibrillation that could be delayed filling versus thrombus. 4. Right more than left pneumonia and atelectasis. Electronically Signed   By: Marnee Spring M.D.   On: 06/29/2019 10:29   MR BRAIN WO CONTRAST  Result Date: 06/30/2019 CLINICAL DATA:  Stroke follow-up EXAM: MRI HEAD WITHOUT CONTRAST TECHNIQUE: Multiplanar, multiecho pulse  sequences of the brain and surrounding structures were obtained without intravenous contrast. COMPARISON:  Head CT June 28, 2019 FINDINGS: Brain: A focus of restricted diffusion is seen in the posterior aspect of the left superior frontal gyrus, consistent with an acute infarct. Area of encephalomalacia and gliosis is seen involving the posterior right insula extending into the right temporal lobe. Scattered and confluent foci of T2 hyperintensity are seen within the white matter of the cerebral hemispheres, nonspecific, most likely related to chronic small vessel ischemia. Prominence of the supratentorial ventricles, cerebral and cerebellar sulci reflecting parenchymal volume loss. No hemorrhage, extra-axial collection or mass lesion. Vascular: Normal flow voids. Skull and upper cervical spine: Normal marrow signal. Sinuses/Orbits: Bilateral lens surgery noted. Visualized sinuses are clear. Other: None. IMPRESSION: 1. Small acute infarct involving the posterior aspect of the left superior frontal gyrus. 2. Encephalomalacia and gliosis involving the posterior right insula extending into the right temporal lobe. 3. Chronic small vessel ischemia and parenchymal volume loss. Electronically Signed   By: Baldemar Lenis M.D.   On: 06/30/2019 13:32   DG Chest Port 1 View  Result Date: 06/30/2019 CLINICAL DATA:  Tachypnea.  Recent stent placement EXAM: PORTABLE CHEST 1 VIEW COMPARISON:  Two days ago FINDINGS: The endotracheal tube is no longer seen. Lung volumes are lower and there is increased diffuse interstitial opacity. The enteric tube reaches the stomach. Stable heart size and mediastinal contours when allowing for overlapping defibrillator pad. No visible effusion or pneumothorax IMPRESSION: Low volume chest with increased interstitial opacity, likely edema and atelectasis. Electronically Signed   By: Marnee Spring M.D.   On: 06/30/2019 05:39   EEG adult  Result Date: 06/29/2019 Charlsie Quest, MD     06/29/2019  2:00 PM Patient Name: Courtney Chan MRN: [id number] Epilepsy Attending: Charlsie Quest Referring Physician/Provider: Dr. Marvel Plan Date: 06/29/2019 Duration: 25.15 minutes Patient history: 86 year old female with history of prior stroke now status post cardiac arrest and seizure-like activity.  EEG to evaluate for seizures. Level of alertness: Comatose AEDs during EEG study: Keppra, lorazepam Technical aspects: This EEG study was done with scalp electrodes positioned according to the 10-20 International system of electrode placement. Electrical activity was acquired at a sampling rate of 500Hz  and reviewed with a high frequency filter of 70Hz  and a low frequency filter of 1Hz . EEG data were recorded continuously and digitally stored. Description: EEG showed continuous generalized low amplitude 2 to 3 Hz delta slowing as well as intermittent 5 to 6  Hz generalized theta slowing.  EEG was reactive to noxious stimuli.  Hyperventilation and photic stimulation were not performed. Abnormality -Continuous slow, generalized IMPRESSION: This study is suggestive of severe diffuse encephalopathy, nonspecific etiology. No seizures or epileptiform discharges were seen throughout the recording. Charlsie Quest   ECHOCARDIOGRAM COMPLETE  Result Date: 06/29/2019    ECHOCARDIOGRAM REPORT   Patient Name:   ARBOR STORMONT Date of Exam: 06/29/2019 Medical Rec #:  [id number]        Height:       64.0 in Accession #:    [id number]       Weight:       157.2 lb Date of Birth:  [birth year]         BSA:          1.77 m Patient Age:    86 years         BP:           95/52 mmHg Patient Gender: F                HR:           86 bpm. Exam Location:  Inpatient Procedure: 2D Echo Indications:    Acute myocardial infarction 410  History:        Patient has no prior history of Echocardiogram examinations.                 CAD, Stroke, Arrythmias:Atrial Fibrillation; Risk                 Factors:Hypertension and  Dyslipidemia. Chronic kidney disease.                 V. fib arrest. Awake and altert while intubated, when extubated                 altered mental status.  Sonographer:    Leta Jungling RDCS Referring Phys: [id number] STEPHANIE FALK MARTIN IMPRESSIONS  1. Left ventricular ejection fraction, by estimation, is 55 to 60%. The left ventricle has normal function. The left ventricle has no regional wall motion abnormalities. Left ventricular diastolic function could not be evaluated.  2. Right ventricular systolic function is normal. The right ventricular size is normal. There is mildly elevated pulmonary artery systolic pressure.  3. The mitral valve is degenerative. Mild mitral valve regurgitation. No evidence of mitral stenosis.  4. Tricuspid valve regurgitation is mild to moderate.  5. The aortic valve is tricuspid. Aortic valve regurgitation is not visualized. Mild to moderate aortic valve sclerosis/calcification is present, without any evidence of aortic stenosis. Comparison(s): No prior Echocardiogram. FINDINGS  Left Ventricle: Left ventricular ejection fraction, by estimation, is 55 to 60%. The left ventricle has normal function. The left ventricle has no regional wall motion abnormalities. There is no left ventricular hypertrophy. The left ventricular diastology could not be evaluated due to atrial fibrillation. Left ventricular diastolic function could not be evaluated. Right Ventricle: The right ventricular size is normal. No increase in right ventricular wall thickness. Right ventricular systolic function is normal. There is mildly elevated pulmonary artery systolic pressure. Left Atrium: Left atrial size was normal in size. Right Atrium: Right atrial size was normal in size. Pericardium: There is no evidence of pericardial effusion. Presence of pericardial fat pad. Mitral Valve: The mitral valve is degenerative in appearance. Severe mitral annular calcification. Mild mitral valve regurgitation. No evidence of  mitral valve stenosis. Tricuspid Valve: The tricuspid valve is grossly normal. Tricuspid valve regurgitation is mild  to moderate. Aortic Valve: The aortic valve is tricuspid. . There is moderate thickening and moderate calcification of the aortic valve. Aortic valve regurgitation is not visualized. Mild to moderate aortic valve sclerosis/calcification is present, without any evidence of aortic stenosis. There is moderate thickening of the aortic valve. There is moderate calcification of the aortic valve. Pulmonic Valve: The pulmonic valve was grossly normal. Pulmonic valve regurgitation is trivial. Aorta: The aortic root and ascending aorta are structurally normal, with no evidence of dilitation. Venous: IVC assessment for right atrial pressure unable to be performed due to mechanical ventilation. IAS/Shunts: No atrial level shunt detected by color flow Doppler.  LEFT VENTRICLE PLAX 2D LVIDd:         3.23 cm  Diastology LVIDs:         1.70 cm  LV e' lateral:   5.84 cm/s LV PW:         1.11 cm  LV E/e' lateral: 18.2 LV IVS:        1.12 cm  LV e' medial:    6.20 cm/s LVOT diam:     1.80 cm  LV E/e' medial:  17.2 LV SV:         30.79 ml LV SV Index:   18.50 LVOT Area:     2.54 cm  RIGHT VENTRICLE RVOT diam:      2.20 cm TAPSE (M-mode): 1.0 cm LEFT ATRIUM             Index       RIGHT ATRIUM           Index LA diam:        3.30 cm 1.87 cm/m  RA Area:     20.50 cm LA Vol (A2C):   43.3 ml 24.52 ml/m RA Volume:   56.80 ml  32.16 ml/m LA Vol (A4C):   49.1 ml 27.80 ml/m LA Biplane Vol: 46.9 ml 26.56 ml/m  AORTIC VALVE LVOT Vmax:   64.30 cm/s LVOT Vmean:  40.800 cm/s LVOT VTI:    0.121 m  AORTA Ao Root diam: 2.70 cm Ao Asc diam:  2.80 cm MITRAL VALVE                TRICUSPID VALVE MV Area (PHT): 5.32 cm     TR Peak grad:   30.2 mmHg MV Decel Time: 143 msec     TR Vmax:        275.00 cm/s MV E velocity: 106.33 cm/s                             SHUNTS                             Systemic VTI:  0.12 m                              Systemic Diam: 1.80 cm                             Pulmonic Diam: 2.20 cm Lennie OdorWesley O'Neal MD Electronically signed by Lennie OdorWesley O'Neal MD Signature Date/Time: 06/29/2019/3:24:25 PM    Final    CT HEAD CODE STROKE WO CONTRAST  Result Date: 06/29/2019 CLINICAL DATA:  Code stroke.  Change in mental status EXAM: CT HEAD WITHOUT CONTRAST TECHNIQUE: Contiguous axial images  were obtained from the base of the skull through the vertex without intravenous contrast. COMPARISON:  Head CT from yesterday FINDINGS: Brain: No evidence of acute infarction, hemorrhage, hydrocephalus, extra-axial collection or mass lesion/mass effect. Remote right superior temporal cortically based infarct. Brain atrophy with ventriculomegaly. Chronic small vessel ischemia in the cerebral white matter. Vascular: No hyperdense vessel. Skull: No acute or aggressive finding Sinuses/Orbits: Negative Other: These results were communicated to Xu at 10:07 amon 2/15/2021by text page via the [facility] messaging system. ASPECTS [facility] Stroke Program Early CT Score) Not scored without localizing symptoms. IMPRESSION: 1. No acute finding. 2. Atrophy, chronic small vessel ischemia, and small remote right superior temporal infarct. Electronically Signed   By: Monte Fantasia M.D.   On: 06/29/2019 10:08    Cardiac Studies     Patient Profile     86 y.o. female with history of HTN, HLD, CKD, persistent atrial fib on Pradaxa, prior CVA, hypothyroidism admitted to [facility] 06/28/19 with a NSTEMI followed by cardiac arrest in the ED. She was resuscitated and emergent cath showed severe Circumflex stenosis. This was treated with a drug eluting stent. Post extubation, mental status change felt to be due to stroke that was seen on brain MRI on 06/30/19.   Assessment & Plan    1. CAD with NSTEMI: Notes reviewed. Pt did not have ST elevation on presentation. Emergent cardiac cath following cardiac arrest. She is doing well this am. No chest pain. LV function normal  by echo. Will continue ASA and Plavix today. Once we resume her Pradaxa (when taking po well), will stop ASA. Continue statin and beta blocker.  -Of note, she pulled out her NG tube last night. Will need swallowing evaluation this am before giving oral medications.  -Resume Losartan tomorrow if BP tolerates  2. Atrial fibrillation, persistent: Will resume Pradaxa when taking po medications. Continue IV heparin until we can restart her Pradaxa. Will resume her home dosage of Toprol 100 mg daily today.   3. CVA: Small left MCA frontal gyrus infarct felt to be embolic by Neurology team. Recommendation to continue anti-coagulation. Resume Pradaxa when taking po meds.   4. Cough: No elevation in WBC count. No fevers. Mild edema on chest x-ray yesterday. Still with basilar crackles. Will give Lasix 40 mg IV today. Repeat portable chest x-ray today.   Will try to arrange early swallowing study to facilitate giving oral medications this morning.   Addendum: She passed her swallowing study. Will stop heparin and start Pradaxa. Will restart Losartan.    For questions or updates, please contact [place] Please consult [email or web address] for contact info under        Signed, Lauree Chandler, MD  07/01/2019, 8:31 AM

## 2019-07-01 NOTE — Plan of Care (Signed)
  Problem: Education: Goal: Knowledge of General Education information will improve Description: Including pain rating scale, medication(s)/side effects and non-pharmacologic comfort measures Outcome: Progressing   Problem: Health Behavior/Discharge Planning: Goal: Ability to manage health-related needs will improve Outcome: Progressing   Problem: Clinical Measurements: Goal: Ability to maintain clinical measurements within normal limits will improve Outcome: Progressing Goal: Will remain free from infection Outcome: Progressing Goal: Diagnostic test results will improve Outcome: Progressing Goal: Respiratory complications will improve Outcome: Progressing Goal: Cardiovascular complication will be avoided Outcome: Progressing   Problem: Activity: Goal: Risk for activity intolerance will decrease Outcome: Progressing   Problem: Nutrition: Goal: Adequate nutrition will be maintained Outcome: Progressing   Problem: Coping: Goal: Level of anxiety will decrease Outcome: Progressing   Problem: Elimination: Goal: Will not experience complications related to bowel motility Outcome: Progressing Goal: Will not experience complications related to urinary retention Outcome: Progressing   Problem: Pain Managment: Goal: General experience of comfort will improve Outcome: Progressing   Problem: Safety: Goal: Ability to remain free from injury will improve Outcome: Progressing   Problem: Skin Integrity: Goal: Risk for impaired skin integrity will decrease Outcome: Progressing   Problem: Education: Goal: Understanding of CV disease, CV risk reduction, and recovery process will improve Outcome: Progressing Goal: Individualized Educational Video(s) Outcome: Progressing   Problem: Activity: Goal: Ability to return to baseline activity level will improve Outcome: Progressing   Problem: Cardiovascular: Goal: Ability to achieve and maintain adequate cardiovascular perfusion  will improve Outcome: Progressing Goal: Vascular access site(s) Level 0-1 will be maintained Outcome: Progressing   Problem: Health Behavior/Discharge Planning: Goal: Ability to safely manage health-related needs after discharge will improve Outcome: Progressing   Problem: Education: Goal: Knowledge of disease or condition will improve Outcome: Progressing Goal: Knowledge of secondary prevention will improve Outcome: Progressing Goal: Knowledge of patient specific risk factors addressed and post discharge goals established will improve Outcome: Progressing Goal: Individualized Educational Video(s) Outcome: Progressing   Problem: Coping: Goal: Will verbalize positive feelings about self Outcome: Progressing Goal: Will identify appropriate support needs Outcome: Progressing   Problem: Health Behavior/Discharge Planning: Goal: Ability to manage health-related needs will improve Outcome: Progressing   Problem: Self-Care: Goal: Ability to participate in self-care as condition permits will improve Outcome: Progressing Goal: Verbalization of feelings and concerns over difficulty with self-care will improve Outcome: Progressing Goal: Ability to communicate needs accurately will improve Outcome: Progressing   Problem: Nutrition: Goal: Risk of aspiration will decrease Outcome: Progressing Goal: Dietary intake will improve Outcome: Progressing   Problem: Intracerebral Hemorrhage Tissue Perfusion: Goal: Complications of Intracerebral Hemorrhage will be minimized Outcome: Progressing   Problem: Ischemic Stroke/TIA Tissue Perfusion: Goal: Complications of ischemic stroke/TIA will be minimized Outcome: Progressing   Problem: Spontaneous Subarachnoid Hemorrhage Tissue Perfusion: Goal: Complications of Spontaneous Subarachnoid Hemorrhage will be minimized Outcome: Progressing

## 2019-07-01 NOTE — Discharge Instructions (Signed)

## 2019-07-01 NOTE — Progress Notes (Signed)
Rehab Admissions Coordinator Note:  Per PTA recommendation, patient was screened by Stephania Fragmin for appropriateness for an Inpatient Acute Rehab Consult.  At this time, we are recommending Inpatient Rehab consult.  Please place a consult order if pt would like to be considered.   Stephania Fragmin 07/01/2019, 3:51 PM  I can be reached at [phone].

## 2019-07-01 NOTE — Progress Notes (Signed)
ANTICOAGULATION CONSULT NOTE  Consult  Pharmacy Consult for Heparin  Indication: Atrial fibrillation  Allergies  Allergen Reactions  . Metronidazole Diarrhea and Nausea And Vomiting  . Oxycodone Other (See Comments)    Altered mental status    Patient Measurements: Height: 5\' 4"  (162.6 cm) Weight: 152 lb 1.9 oz (69 kg) IBW/kg (Calculated) : 54.7 Heparin Dosing Weight: 68 kg   Vital Signs: Temp: 97.3 F (36.3 C) (02/17 0424) BP: 137/69 (02/17 0424) Pulse Rate: 100 (02/17 0424)  Labs: Recent Labs     0000 06/28/19 1302 06/28/19 1522 06/28/19 2051 06/29/19 0550 06/29/19 0550 06/29/19 1200 06/29/19 2012 06/30/19 0215 07/01/19 0507  HGB  --  12.7  --    < > 10.9*   < >  --   --  10.4* 12.0  HCT  --  40.3  --    < > 35.8*  --   --   --  34.5* 38.4  PLT   < > 386  --   --  338  --   --   --  289 274  APTT  --   --  66*  --   --   --   --   --   --   --   LABPROT  --   --  25.2*  --   --   --   --   --   --   --   INR  --   --  2.3*  --   --   --   --   --   --   --   HEPARINUNFRC  --   --   --   --   --   --   --  0.30 0.33 0.23*  CREATININE   < > 1.05*  --   --  1.18*   < > 1.24*  --  1.05* 1.02*  TROPONINIHS  --  11,576* 13,408*  --   --   --   --   --   --   --    < > = values in this interval not displayed.    Estimated Creatinine Clearance: 37.8 mL/min (A) (by C-G formula based on SCr of 1.02 mg/dL (H)).  Assessment: 86 yo female with h/o Afib s/p LCx PCI 2/14, Pradaxa on hold - plan to resume closer to DC.  Heparin level this morning is low at 0.23, CBC stable no bleeding  Goal of Therapy:  Heparin level 0.3-0.7 units/ml Monitor platelets by anticoagulation protocol: Yes   Plan:  Increase heparin 900 units/hr Check heparin level with am labs.  3/14 PharmD., BCPS Clinical Pharmacist 07/01/2019 7:27 AM

## 2019-07-01 NOTE — Progress Notes (Addendum)
Physical Therapy Treatment Patient Details Name: Courtney Chan MRN: [id number] DOB: [birth year] Today's Date: 07/01/2019    History of Present Illness Courtney Chan is a 86 y.o. female with a hx of CAD with PCI to the RCA and Circumflex in 2010 along with chronic persistent A. fib, prior CVA, HTN, HLD, who presented to [facility] on 06/28/19 with cardiac arrest-V fib and elevated troponin. Pt was revived in ED after undergoing CPR and defibrilation. She was found to be in A. fib RVR. Pt now s/p cardiac cath on 06/28/19. Pt now found to have small acute infarct of posterior aspect of the leftsuperior frontal gyrus.    PT Comments    Patient seen for mobility progression. Pt in bed upon arrival and eager to participate in therapy. Pt presents with impaired balance and decreased awareness of safety and deficits. Pt with multiple LOB during session requiring assistance to recover. Pt is independent at baseline and very active PTA. Given pt's current mobility level recommend CIR for further skilled PT services to maximize independence and safety with mobility.    SpO2 88% on 2L O2 via [place] at rest and HR into 130s with mobility. Pt on 3L O2 end of session.    Follow Up Recommendations  Supervision/Assistance - 24 hour;CIR     Equipment Recommendations  None recommended by PT    Recommendations for Other Services       Precautions / Restrictions Precautions Precautions: Fall    Mobility  Bed Mobility Overal bed mobility: Needs Assistance Bed Mobility: Supine to Sit     Supine to sit: Min assist;HOB elevated     General bed mobility comments: assist to elevate trunk into sitting   Transfers Overall transfer level: Needs assistance Equipment used: Rolling walker (2 wheeled);1 person hand held assist Transfers: Stand Pivot Transfers;Sit to/from Stand Sit to Stand: Min assist Stand pivot transfers: Mod assist       General transfer comment: assist to power up into standing and for  balance; cues for safety and for use of AD  Ambulation/Gait Ambulation/Gait assistance: Mod assist Gait Distance (Feet): 25 Feet Assistive device: Rolling walker (2 wheeled) Gait Pattern/deviations: Decreased stride length;Step-through pattern;Staggering left;Staggering right;Trunk flexed Gait velocity: decreased   General Gait Details: assistance required for balance and managing RW; pt with multiple lateral LOB requiring assist to recover   Stairs             Wheelchair Mobility    Modified Rankin (Stroke Patients Only)       Balance Overall balance assessment: Needs assistance Sitting-balance support: Feet supported Sitting balance-Leahy Scale: Fair     Standing balance support: During functional activity;Single extremity supported;Bilateral upper extremity supported Standing balance-Leahy Scale: Poor                              Cognition Arousal/Alertness: Awake/alert Behavior During Therapy: Impulsive Overall Cognitive Status: Impaired/Different from baseline Area of Impairment: Memory;Safety/judgement;Problem solving;Following commands                     Memory: Decreased short-term memory Following Commands: Follows one step commands inconsistently Safety/Judgement: Decreased awareness of deficits;Decreased awareness of safety   Problem Solving: Requires verbal cues;Difficulty sequencing General Comments: pt is impulsive to stand and with near fall attempting transfer BSC to EOB      Exercises      General Comments        Pertinent Vitals/Pain Pain  Assessment: Faces Pain Score: 10-Worst pain ever Faces Pain Scale: Hurts little more Pain Location: chest Pain Descriptors / Indicators: Sore Pain Intervention(s): Monitored during session;Heat applied    Home Living                      Prior Function            PT Goals (current goals can now be found in the care plan section) Progress towards PT goals:  Progressing toward goals    Frequency    Min 4X/week      PT Plan Discharge plan needs to be updated    Co-evaluation              AM-PAC PT "6 Clicks" Mobility   Outcome Measure  Help needed turning from your back to your side while in a flat bed without using bedrails?: A Little Help needed moving from lying on your back to sitting on the side of a flat bed without using bedrails?: A Little Help needed moving to and from a bed to a chair (including a wheelchair)?: A Little Help needed standing up from a chair using your arms (e.g., wheelchair or bedside chair)?: A Little Help needed to walk in hospital room?: A Little Help needed climbing 3-5 steps with a railing? : A Lot 6 Click Score: 17    End of Session Equipment Utilized During Treatment: Gait belt Activity Tolerance: Patient tolerated treatment well Patient left: with call bell/phone within reach;in chair;with chair alarm set Nurse Communication: Mobility status PT Visit Diagnosis: Muscle weakness (generalized) (M62.81);Difficulty in walking, not elsewhere classified (R26.2);Unsteadiness on feet (R26.81)     Time: 9604-5409 PT Time Calculation (min) (ACUTE ONLY): 30 min  Charges:  $Gait Training: 23-37 mins                     Earney Navy, PTA Acute Rehabilitation Services Pager: [phone] Office: [phone]     Darliss Cheney 07/01/2019, 1:27 PM

## 2019-07-01 NOTE — Progress Notes (Signed)
   Vital Signs MEWS/VS Documentation       07/01/2019 0128 07/01/2019 0424 07/01/2019 0737 07/01/2019 0900   MEWS Score:  0  0  0  2   MEWS Score Color:  Green  Green  Green  Yellow   Resp:  20  18  18   --   Pulse:  91  100  100  --   BP:  128/71  137/69  (!) 158/62  --   Temp:  97.6 F (36.4 C)  (!) 97.3 F (36.3 C)  98 F (36.7 C)  --   O2 Device:  Nasal Cannula  Nasal Cannula  Nasal Cannula  --        CCMD called about patient's HR nonsustaining in the 130s. Physical Therapy is in the room working with the patient.    07/01/2019,10:26 AM

## 2019-07-02 DIAGNOSIS — I4891 Unspecified atrial fibrillation: Secondary | ICD-10-CM

## 2019-07-02 DIAGNOSIS — R5381 Other malaise: Secondary | ICD-10-CM

## 2019-07-02 LAB — CBC
HCT: 36.1 % (ref 36.0–46.0)
Hemoglobin: 11.5 g/dL — ABNORMAL LOW (ref 12.0–15.0)
MCH: 30.3 pg (ref 26.0–34.0)
MCHC: 31.9 g/dL (ref 30.0–36.0)
MCV: 95.3 fL (ref 80.0–100.0)
Platelets: 344 10*3/uL (ref 150–400)
RBC: 3.79 MIL/uL — ABNORMAL LOW (ref 3.87–5.11)
RDW: 14 % (ref 11.5–15.5)
WBC: 10.6 10*3/uL — ABNORMAL HIGH (ref 4.0–10.5)
nRBC: 0 % (ref 0.0–0.2)

## 2019-07-02 LAB — BASIC METABOLIC PANEL
Anion gap: 13 (ref 5–15)
BUN: 21 mg/dL (ref 8–23)
CO2: 27 mmol/L (ref 22–32)
Calcium: 8.9 mg/dL (ref 8.9–10.3)
Chloride: 100 mmol/L (ref 98–111)
Creatinine, Ser: 1.15 mg/dL — ABNORMAL HIGH (ref 0.44–1.00)
GFR calc Af Amer: 50 mL/min — ABNORMAL LOW (ref >60)
GFR calc non Af Amer: 43 mL/min — ABNORMAL LOW (ref >60)
Glucose, Bld: 149 mg/dL — ABNORMAL HIGH (ref 70–99)
Potassium: 3.1 mmol/L — ABNORMAL LOW (ref 3.5–5.1)
Sodium: 140 mmol/L (ref 135–145)

## 2019-07-02 MED ORDER — TRAMADOL HCL 50 MG PO TABS
50.0000 mg | ORAL_TABLET | Freq: Four times a day (QID) | ORAL | Status: DC | PRN
  Administered 2019-07-02 – 2019-07-03 (×4): 50 mg via ORAL
  Filled 2019-07-02 (×4): qty 1

## 2019-07-02 MED ORDER — TRAMADOL HCL 50 MG PO TABS: 50.0000 mg | ORAL_TABLET | Freq: Four times a day (QID) | ORAL | Status: DC

## 2019-07-02 MED ORDER — FUROSEMIDE 20 MG PO TABS
20.0000 mg | ORAL_TABLET | Freq: Every day | ORAL | Status: DC
  Administered 2019-07-02 – 2019-07-04 (×3): 20 mg via ORAL
  Filled 2019-07-02 (×3): qty 1

## 2019-07-02 MED ORDER — POTASSIUM CHLORIDE CRYS ER 20 MEQ PO TBCR
40.0000 meq | EXTENDED_RELEASE_TABLET | ORAL | Status: AC
  Administered 2019-07-02 (×2): 40 meq via ORAL
  Filled 2019-07-02 (×2): qty 2

## 2019-07-02 MED ORDER — CLOPIDOGREL BISULFATE 75 MG PO TABS
75.0000 mg | ORAL_TABLET | Freq: Every day | ORAL | Status: DC
  Administered 2019-07-02 – 2019-07-04 (×3): 75 mg via ORAL
  Filled 2019-07-02 (×3): qty 1

## 2019-07-02 MED ORDER — METOPROLOL SUCCINATE ER 100 MG PO TB24
100.0000 mg | ORAL_TABLET | Freq: Every day | ORAL | Status: DC
  Administered 2019-07-02 – 2019-07-03 (×2): 100 mg via ORAL
  Filled 2019-07-02 (×2): qty 1

## 2019-07-02 NOTE — Consult Note (Signed)
Physical Medicine and Rehabilitation Consult Reason for Consult: Altered mental status Referring Physician: Dr. Herbie Baltimore   HPI: Courtney Chan is a 86 y.o. right-handed female with history of atrial fibrillation, hyperlipidemia, hypertension, CAD with PCI to the RCA and circumflex 2010.  Per chart review patient lives with spouse.  Independent prior to admission.  1 level home.  Patient very active prior to admission.  Presented on 06/27/2018 with persistent chest discomfort and was brought to the emergency room at [facility].  She was in the process of waiting in the waiting room when she became unresponsive and pulseless.  Patient appeared to be in ventricular tachycardia.  She did undergo defibrillation x1 with additional 3 to 4 minutes of CPR.  She was found to have non-STEMI with PCI completed per cardiology services.  On 06/28/2018 after extubation patient with altered mental status not following commands.  MRI and imaging showed small acute infarct involving the posterior aspect of the left superior frontal gyrus.  CT angiogram of head and neck with no emergent vascular findings.  Echocardiogram with ejection fraction of 60% without emboli.  EEG negative for seizure.  Neurology follow-up patient currently is maintained on Plavix as well as Pradaxa.  Tolerating a regular diet.  Therapy evaluations completed with recommendations of physical medicine rehab consult. Discussed with patient's nurse, working on diuresis today, has periwick because of frequent urination.  Generally has good bladder control  Review of Systems  Constitutional: Negative for fever.  HENT: Negative for hearing loss.   Eyes: Negative for blurred vision and double vision.  Respiratory: Negative for cough and shortness of breath.   Cardiovascular: Positive for palpitations and leg swelling. Negative for chest pain.  Gastrointestinal: Positive for constipation. Negative for heartburn and vomiting.  Genitourinary: Negative  for dysuria, flank pain and hematuria.  Musculoskeletal: Positive for myalgias.  Skin: Negative for rash.  All other systems reviewed and are negative.  Past Medical History:  Diagnosis Date  . Atrial fibrillation (HCC) 2011  . Hyperlipidemia   . Hypertension   . Stroke (HCC)   . Thyroid disease    Past Surgical History:  Procedure Laterality Date  . ABDOMINAL HYSTERECTOMY    . BREAST BIOPSY Left 1980   Negative  . CORONARY/GRAFT ACUTE MI REVASCULARIZATION N/A 06/28/2019   Procedure: Coronary/Graft Acute MI Revascularization;  Surgeon: Marykay Lex, MD;  Location: [facility] INVASIVE CV LAB;  Service: Cardiovascular;  Laterality: N/A;  . LEFT HEART CATH AND CORONARY ANGIOGRAPHY N/A 06/28/2019   Procedure: LEFT HEART CATH AND CORONARY ANGIOGRAPHY;  Surgeon: Marykay Lex, MD;  Location: [facility] INVASIVE CV LAB;  Service: Cardiovascular;  Laterality: N/A;   Family History  Problem Relation Age of Onset  . Breast cancer Maternal Grandmother        4's   Social History:  reports that she has never smoked. She has never used smokeless tobacco. She reports that she does not drink alcohol or use drugs. Allergies:  Allergies  Allergen Reactions  . Metronidazole Diarrhea and Nausea And Vomiting  . Oxycodone Other (See Comments)    Altered mental status   Medications Prior to Admission  Medication Sig Dispense Refill  . levothyroxine (SYNTHROID, LEVOTHROID) 88 MCG tablet Take 88 mcg by mouth daily.       Home: Home Living Family/patient expects to be discharged to:: Private residence Living Arrangements: Spouse/significant other Available Help at Discharge: Family(pt's husband available, her daughter Oley Balm live close by as well, one son lives in  [place]) Type of Home: House Home Access: Level entry Home Layout: One level Bathroom Accessibility: Yes Home Equipment: Walker - 2 wheels, Cane - single point  Functional History: Prior Function Level of Independence:  Independent Comments: pt has been very active and walks ~ 2 miles every morning with no device. she enjoys gardening and her and her husband both take care of the yard and cooking/cleaning. she is independent for all ADL's. Functional Status:  Mobility: Bed Mobility Overal bed mobility: Needs Assistance Bed Mobility: Supine to Sit Supine to sit: Min assist, HOB elevated General bed mobility comments: assist to elevate trunk into sitting  Transfers Overall transfer level: Needs assistance Equipment used: Rolling walker (2 wheeled), 1 person hand held assist Transfers: Stand Pivot Transfers, Sit to/from Stand Sit to Stand: Min assist Stand pivot transfers: Mod assist General transfer comment: assist to power up into standing and for balance; cues for safety and for use of AD Ambulation/Gait Ambulation/Gait assistance: Mod assist Gait Distance (Feet): 25 Feet Assistive device: Rolling walker (2 wheeled) Gait Pattern/deviations: Decreased stride length, Step-through pattern, Staggering left, Staggering right, Trunk flexed General Gait Details: assistance required for balance and managing RW; pt with multiple lateral LOB requiring assist to recover Gait velocity: decreased    ADL:    Cognition: Cognition Overall Cognitive Status: Impaired/Different from baseline Orientation Level: Oriented X4 Cognition Arousal/Alertness: Awake/alert Behavior During Therapy: Impulsive Overall Cognitive Status: Impaired/Different from baseline Area of Impairment: Memory, Safety/judgement, Problem solving, Following commands Orientation Level: (slight impairments but pt able to correct them with cues) Memory: Decreased short-term memory Following Commands: Follows one step commands inconsistently Safety/Judgement: Decreased awareness of deficits, Decreased awareness of safety Problem Solving: Requires verbal cues, Difficulty sequencing General Comments: pt is impulsive to stand and with near fall  attempting transfer BSC to EOB  Blood pressure 119/86, pulse 86, temperature 97.6 F (36.4 C), temperature source Oral, resp. rate 20, height 5\' 4"  (1.626 m), weight 66.9 kg, SpO2 93 %. Physical Exam  Neurological:  Patient is alert sitting up in bed no acute distress oriented x3 and follows commands.    General: No acute distress Mood and affect are appropriate Heart: Regular rate and rhythm no rubs murmurs or extra sounds Lungs: Decreased breath sounds at bases clear to auscultation, breathing unlabored, no rales or wheezes Abdomen: Positive bowel sounds, soft nontender to palpation, nondistended Extremities: No clubbing, cyanosis, or edema, no calf tenderness to palpation negative Homans Skin: No evidence of breakdown, no evidence of rash Neurologic: Cranial nerves II through XII intact, motor strength is 4/5 in bilateral deltoid, bicep, tricep, grip, hip flexor, knee extensors, ankle dorsiflexor and plantar flexor Sensory exam normal sensation to light touch  in bilateral upper and lower extremities Cerebellar exam normal finger to nose to finger as well as heel to shin in bilateral upper extremities Musculoskeletal: Full range of motion in all 4 extremities.  Multiple joint deformities both hands DIPs and PIPs Results for orders placed or performed during the hospital encounter of 06/28/19 (from the past 24 hour(s))  CBC     Status: Abnormal   Collection Time: 07/02/19 12:21 AM  Result Value Ref Range   WBC 10.6 (H) 4.0 - 10.5 K/uL   RBC 3.79 (L) 3.87 - 5.11 MIL/uL   Hemoglobin 11.5 (L) 12.0 - 15.0 g/dL   HCT 07/04/19 35.0 - 09.3 %   MCV 95.3 80.0 - 100.0 fL   MCH 30.3 26.0 - 34.0 pg   MCHC 31.9 30.0 - 36.0 g/dL  RDW 14.0 11.5 - 15.5 %   Platelets 344 150 - 400 K/uL   nRBC 0.0 0.0 - 0.2 %  Basic metabolic panel     Status: Abnormal   Collection Time: 07/02/19 12:21 AM  Result Value Ref Range   Sodium 140 135 - 145 mmol/L   Potassium 3.1 (L) 3.5 - 5.1 mmol/L   Chloride 100 98  - 111 mmol/L   CO2 27 22 - 32 mmol/L   Glucose, Bld 149 (H) 70 - 99 mg/dL   BUN 21 8 - 23 mg/dL   Creatinine, Ser 1.15 (H) 0.44 - 1.00 mg/dL   Calcium 8.9 8.9 - 10.3 mg/dL   GFR calc non Af Amer 43 (L) >60 mL/min   GFR calc Af Amer 50 (L) >60 mL/min   Anion gap 13 5 - 15   MR BRAIN WO CONTRAST  Result Date: 06/30/2019 CLINICAL DATA:  Stroke follow-up EXAM: MRI HEAD WITHOUT CONTRAST TECHNIQUE: Multiplanar, multiecho pulse sequences of the brain and surrounding structures were obtained without intravenous contrast. COMPARISON:  Head CT June 28, 2019 FINDINGS: Brain: A focus of restricted diffusion is seen in the posterior aspect of the left superior frontal gyrus, consistent with an acute infarct. Area of encephalomalacia and gliosis is seen involving the posterior right insula extending into the right temporal lobe. Scattered and confluent foci of T2 hyperintensity are seen within the white matter of the cerebral hemispheres, nonspecific, most likely related to chronic small vessel ischemia. Prominence of the supratentorial ventricles, cerebral and cerebellar sulci reflecting parenchymal volume loss. No hemorrhage, extra-axial collection or mass lesion. Vascular: Normal flow voids. Skull and upper cervical spine: Normal marrow signal. Sinuses/Orbits: Bilateral lens surgery noted. Visualized sinuses are clear. Other: None. IMPRESSION: 1. Small acute infarct involving the posterior aspect of the left superior frontal gyrus. 2. Encephalomalacia and gliosis involving the posterior right insula extending into the right temporal lobe. 3. Chronic small vessel ischemia and parenchymal volume loss. Electronically Signed   By: Pedro Earls M.D.   On: 06/30/2019 13:32   DG Chest Port 1 View  Result Date: 07/01/2019 CLINICAL DATA:  Shortness of breath.  CHF. EXAM: PORTABLE CHEST 1 VIEW COMPARISON:  06/30/2019 FINDINGS: Normal heart size. Probable small bilateral pleural effusions. No  pneumothorax. Extremely low lung volumes. Perihilar and basilar predominant interstitial and airspace disease is felt to be slightly improved. IMPRESSION: Minimal improvement in aeration. Interstitial and airspace disease, at least partially felt to represent interstitial edema and atelectasis. Infection at the lung bases cannot be excluded. Electronically Signed   By: Abigail Miyamoto M.D.   On: 07/01/2019 09:42     Assessment/Plan: Diagnosis: Left superior frontal infarct and debility following V. tach arrest 1. Does the need for close, 24 hr/day medical supervision in concert with the patient's rehab needs make it unreasonable for this patient to be served in a less intensive setting? Yes 2. Co-Morbidities requiring supervision/potential complications: Congestive heart failure-diastolic, atrial fibrillation, history of coronary artery disease status post stenting 3. Due to bladder management, bowel management, safety, skin/wound care, disease management, medication administration, pain management and patient education, does the patient require 24 hr/day rehab nursing? Yes 4. Does the patient require coordinated care of a physician, rehab nurse, therapy disciplines of PT, OT to address physical and functional deficits in the context of the above medical diagnosis(es)? Yes Addressing deficits in the following areas: balance, endurance, locomotion, strength, transferring, bowel/bladder control, bathing, dressing, feeding, toileting and psychosocial support 5. Can the patient  actively participate in an intensive therapy program of at least 3 hrs of therapy per day at least 5 days per week? Yes 6. The potential for patient to make measurable gains while on inpatient rehab is good 7. Anticipated functional outcomes upon discharge from inpatient rehab are modified independent and supervision  with PT, modified independent and supervision with OT, n/a with SLP. 8. Estimated rehab length of stay to reach the  above functional goals is: 10 to 14 days 9. Anticipated discharge destination: Home 10. Overall Rehab/Functional Prognosis: good  RECOMMENDATIONS: This patient's condition is appropriate for continued rehabilitative care in the following setting: CIR Patient has agreed to participate in recommended program. Yes Note that insurance prior authorization may be required for reimbursement for recommended care.  Comment: Needs additional diuresis before she is ready for more aggressive rehabilitation.  Should be ready tomorrow   Charlton Amor, PA-C 07/02/2019   "I have personally performed a face to face diagnostic evaluation of this patient.  Additionally, I have reviewed and concur with the physician assistant's documentation above." Erick Colace M.D. [facility] FAAPM&R (Neuromuscular Med) Diplomate Am Board of Electrodiagnostic Med Fellow Am Board of Interventional Pain

## 2019-07-02 NOTE — Progress Notes (Signed)
Progress Note  Patient Name: Courtney Chan Date of Encounter: 07/02/2019  Primary Cardiologist: [facility])  Subjective   Only c/o chest wall pain.   Inpatient Medications    Scheduled Meds: . atorvastatin  80 mg Oral q1800  . chlorhexidine  15 mL Mouth Rinse BID  . Chlorhexidine Gluconate Cloth  6 each Topical Daily  . clopidogrel  75 mg Per Tube Daily  . dabigatran  150 mg Oral Q12H  . levothyroxine  88 mcg Oral Q0600  . lidocaine  1 patch Transdermal Q24H  . losartan  25 mg Oral Daily  . mouth rinse  15 mL Mouth Rinse q12n4p  . metoprolol tartrate  25 mg Oral BID  . potassium chloride  40 mEq Oral Q4H   Continuous Infusions:  PRN Meds: acetaminophen (TYLENOL) oral liquid 160 mg/5 mL   Vital Signs    Vitals:   07/01/19 1141 07/01/19 2044 07/02/19 0350 07/02/19 0400  BP: 135/76 117/75 119/86   Pulse: (!) 104 86 86   Resp: 18 18 20    Temp:   97.6 F (36.4 C)   TempSrc:   Oral   SpO2: 95% 96% 93%   Weight:    66.9 kg  Height:        Intake/Output Summary (Last 24 hours) at 07/02/2019 0817 Last data filed at 07/02/2019 0350 Gross per 24 hour  Intake 840 ml  Output 1400 ml  Net -560 ml   Last 3 Weights 07/02/2019 07/01/2019 06/30/2019  Weight (lbs) 147 lb 7.8 oz 152 lb 1.9 oz 157 lb 10.1 oz  Weight (kg) 66.9 kg 69 kg 71.5 kg      Telemetry   Atrial fib, rate 110s- Personally Reviewed  ECG    No AM EKG - Personally Reviewed  Physical Exam    General: Well developed, well nourished, NAD  HEENT: OP clear, mucus membranes moist  SKIN: warm, dry. No rashes. Neuro: No focal deficits  Musculoskeletal: Muscle strength 5/5 all ext  Psychiatric: Mood and affect normal  Neck: No JVD, no carotid bruits, no thyromegaly, no lymphadenopathy.  Lungs:Clear bilaterally, no wheezes, rhonci, crackles Cardiovascular: Irreg irreg. No murmurs, gallops or rubs. Abdomen:Soft. Bowel sounds present. Non-tender.  Extremities: No lower extremity edema.  Pulses are 2 + in the bilateral DP/PT.   Labs    High Sensitivity Troponin:   Recent Labs  Lab 06/28/19 1302 06/28/19 1522  TROPONINIHS 11,576* 13,408*      Chemistry Recent Labs  Lab 06/29/19 1200 06/29/19 1200 06/30/19 0215 07/01/19 0507 07/02/19 0021  NA 142   < > 143 144 140  K 4.4   < > 4.0 3.9 3.1*  CL 109   < > 114* 108 100  CO2 23   < > 22 22 27   GLUCOSE 115*   < > 106* 91 149*  BUN 18   < > 18 18 21   CREATININE 1.24*   < > 1.05* 1.02* 1.15*  CALCIUM 8.4*   < > 8.2* 9.0 8.9  PROT 5.4*  --   --   --   --   ALBUMIN 2.9*  --   --   --   --   AST 70*  --   --   --   --   ALT 29  --   --   --   --   ALKPHOS 71  --   --   --   --   BILITOT 0.8  --   --   --   --  GFRNONAA 39*   < > 48* 50* 43*  GFRAA 46*   < > 56* 58* 50*  ANIONGAP 10   < > 7 14 13    < > = values in this interval not displayed.     Hematology Recent Labs  Lab 06/30/19 0215 07/01/19 0507 07/02/19 0021  WBC 9.7 10.4 10.6*  RBC 3.47* 3.96 3.79*  HGB 10.4* 12.0 11.5*  HCT 34.5* 38.4 36.1  MCV 99.4 97.0 95.3  MCH 30.0 30.3 30.3  MCHC 30.1 31.3 31.9  RDW 14.6 14.2 14.0  PLT 289 274 344    BNPNo results for input(s): BNP, PROBNP in the last 168 hours.   DDimer No results for input(s): DDIMER in the last 168 hours.   Radiology    MR BRAIN WO CONTRAST  Result Date: 06/30/2019 CLINICAL DATA:  Stroke follow-up EXAM: MRI HEAD WITHOUT CONTRAST TECHNIQUE: Multiplanar, multiecho pulse sequences of the brain and surrounding structures were obtained without intravenous contrast. COMPARISON:  Head CT June 28, 2019 FINDINGS: Brain: A focus of restricted diffusion is seen in the posterior aspect of the left superior frontal gyrus, consistent with an acute infarct. Area of encephalomalacia and gliosis is seen involving the posterior right insula extending into the right temporal lobe. Scattered and confluent foci of T2 hyperintensity are seen within the white matter of the cerebral hemispheres,  nonspecific, most likely related to chronic small vessel ischemia. Prominence of the supratentorial ventricles, cerebral and cerebellar sulci reflecting parenchymal volume loss. No hemorrhage, extra-axial collection or mass lesion. Vascular: Normal flow voids. Skull and upper cervical spine: Normal marrow signal. Sinuses/Orbits: Bilateral lens surgery noted. Visualized sinuses are clear. Other: None. IMPRESSION: 1. Small acute infarct involving the posterior aspect of the left superior frontal gyrus. 2. Encephalomalacia and gliosis involving the posterior right insula extending into the right temporal lobe. 3. Chronic small vessel ischemia and parenchymal volume loss. Electronically Signed   By: June 30, 2019 M.D.   On: 06/30/2019 13:32   DG Chest Port 1 View  Result Date: 07/01/2019 CLINICAL DATA:  Shortness of breath.  CHF. EXAM: PORTABLE CHEST 1 VIEW COMPARISON:  06/30/2019 FINDINGS: Normal heart size. Probable small bilateral pleural effusions. No pneumothorax. Extremely low lung volumes. Perihilar and basilar predominant interstitial and airspace disease is felt to be slightly improved. IMPRESSION: Minimal improvement in aeration. Interstitial and airspace disease, at least partially felt to represent interstitial edema and atelectasis. Infection at the lung bases cannot be excluded. Electronically Signed   By: 07/02/2019 M.D.   On: 07/01/2019 09:42    Cardiac Studies     Patient Profile     86 y.o. female with history of HTN, HLD, CKD, persistent atrial fib on Pradaxa, prior CVA, hypothyroidism admitted to [facility] 06/28/19 with a NSTEMI followed by cardiac arrest in the ED. She was resuscitated and emergent cath showed severe Circumflex stenosis. This was treated with a drug eluting stent. Post extubation, mental status change felt to be due to stroke that was seen on brain MRI on 06/30/19.   Assessment & Plan    1. CAD with NSTEMI: Notes reviewed. Pt did not have ST elevation on  presentation. Emergent cardiac cath following cardiac arrest. Stable today. She only has chest wall pain. LV function normal by echo. Continue Plavix, statin, beta blocker. She is now off of ASA since she is on Pradaxa.   2. Atrial fibrillation, persistent: Will change metoprolol to Toprol 100 mg daily (her home dose) and continue Pradaxa.  3. CVA: Small left MCA frontal gyrus infarct felt to be embolic by Neurology team. Recommendation to continue anti-coagulation. Continue Pradaxa.   4. Hypokalemia: Will replace potassium this morning.   5. Diastolic CHF: Will change Lasix to 20 mg po daily today.   Inpatient rehab consult.   For questions or updates, please contact [facility] Please consult [email or web address] for contact info under      Signed, Verne Carrow, MD  07/02/2019, 8:17 AM

## 2019-07-02 NOTE — Progress Notes (Signed)
Physical Therapy Treatment Patient Details Name: Courtney Chan MRN: [id number] DOB: 1930/06/05 Today's Date: 07/02/2019    History of Present Illness Courtney Chan is a 86 y.o. female with a hx of CAD with PCI to the RCA and Circumflex in 2010 along with chronic persistent A. fib, prior CVA, HTN, HLD, who presented to [facility] on 06/28/19 with cardiac arrest-V fib and elevated troponin. Pt was revived in ED after undergoing CPR and defibrilation. She was found to be in A. fib RVR. Pt now s/p cardiac cath on 06/28/19. Pt now found to have small acute infarct of posterior aspect of the leftsuperior frontal gyrus.    PT Comments    Patient seen for mobility progression. Pt continues to present with impaired balance and requires assistance for OOB mobility. Continue to recommend CIR for further skilled PT services to maximize independence and safety with mobility.     Follow Up Recommendations  Supervision/Assistance - 24 hour;CIR     Equipment Recommendations  None recommended by PT    Recommendations for Other Services       Precautions / Restrictions Precautions Precautions: Fall Restrictions Weight Bearing Restrictions: No    Mobility  Bed Mobility Overal bed mobility: Needs Assistance Bed Mobility: Supine to Sit     Supine to sit: HOB elevated;Min guard     General bed mobility comments: use of bed rail and increased time and effort to get to EOB  Transfers Overall transfer level: Needs assistance Equipment used: Rolling walker (2 wheeled);1 person hand held assist Transfers: Sit to/from Stand Sit to Stand: Min assist         General transfer comment: assistance for balance  Ambulation/Gait Ambulation/Gait assistance: Mod assist;Min assist Gait Distance (Feet): 120 Feet Assistive device: Rolling walker (2 wheeled);1 person hand held assist Gait Pattern/deviations: Decreased stride length;Step-through pattern;Trunk flexed;Drifts right/left;Staggering  left;Staggering right Gait velocity: decreased   General Gait Details: min A with RW and mod A with HHA for balance/weight shifting; cues for safe use of AD   Stairs             Wheelchair Mobility    Modified Rankin (Stroke Patients Only)       Balance Overall balance assessment: Needs assistance Sitting-balance support: Feet supported Sitting balance-Leahy Scale: Fair     Standing balance support: During functional activity;Single extremity supported;Bilateral upper extremity supported Standing balance-Leahy Scale: Poor                              Cognition Arousal/Alertness: Awake/alert Behavior During Therapy: WFL for tasks assessed/performed;Flat affect Overall Cognitive Status: Impaired/Different from baseline Area of Impairment: Safety/judgement;Problem solving;Following commands                     Memory: Decreased short-term memory Following Commands: Follows one step commands consistently Safety/Judgement: Decreased awareness of deficits;Decreased awareness of safety   Problem Solving: Requires verbal cues;Difficulty sequencing General Comments: pt demonstrates less impulsivity this session       Exercises      General Comments        Pertinent Vitals/Pain Pain Assessment: Faces Faces Pain Scale: Hurts little more Pain Location: chest Pain Descriptors / Indicators: Sore Pain Intervention(s): Monitored during session;Repositioned;Limited activity within patient's tolerance    Home Living                      Prior Function  PT Goals (current goals can now be found in the care plan section) Progress towards PT goals: Progressing toward goals    Frequency    Min 4X/week      PT Plan Current plan remains appropriate    Co-evaluation              AM-PAC PT "6 Clicks" Mobility   Outcome Measure  Help needed turning from your back to your side while in a flat bed without using  bedrails?: A Little Help needed moving from lying on your back to sitting on the side of a flat bed without using bedrails?: A Little Help needed moving to and from a bed to a chair (including a wheelchair)?: A Little Help needed standing up from a chair using your arms (e.g., wheelchair or bedside chair)?: A Little Help needed to walk in hospital room?: A Little Help needed climbing 3-5 steps with a railing? : A Lot 6 Click Score: 17    End of Session Equipment Utilized During Treatment: Gait belt Activity Tolerance: Patient tolerated treatment well Patient left: with call bell/phone within reach;in chair;with chair alarm set Nurse Communication: Mobility status PT Visit Diagnosis: Muscle weakness (generalized) (M62.81);Difficulty in walking, not elsewhere classified (R26.2);Unsteadiness on feet (R26.81)     Time: 3536-1443 PT Time Calculation (min) (ACUTE ONLY): 23 min  Charges:  $Gait Training: 8-22 mins                     Earney Navy, PTA Acute Rehabilitation Services Pager: [phone] Office: [phone]     Darliss Cheney 07/02/2019, 12:38 PM

## 2019-07-02 NOTE — PMR Pre-admission (Addendum)
PMR Admission Coordinator Pre-Admission Assessment  Patient: Courtney Chan is an 86 y.o., female MRN: [id number] DOB: Mar 20, 1931 Height: 5\' 4"  (162.6 cm) Weight: 68.5 kg              Insurance Information HMO:     PPO: yes     PCP:      IPA:      80/20:      OTHER:  PRIMARY: [employer]      Policy#:[id number]       Subscriber: pt CM Name: Dot      Phone#: [phone] ext [phone]    Fax#: [phone] Pre-Cert#: [id number] approved for 7 days with f/u [id number] ext Montel Clock same fax      Employer:  Benefits:  Phone #: [phone]     Name: 2/19 Eff. Date: 05/15/2019     Deduct: none      Out of Pocket Max: $4000      Life Max: none CIR: $160 co pay per day days 1 until 10      SNF: no copay days 1 until 20; $50 co pay per days days 21 until 100 Outpatient: $20 per visit     Co-Pay: visitw per medical necceisty Home Health: 100%      Co-Pay: visits per medical necceisty DME: 80%     Co-Pay: 20% Providers: in netowrk  SECONDARY: none       Medicaid Application Date:       Case Manager:  Disability Application Date:       Case Worker:   The "Data Collection Information Summary" for patients in Inpatient Rehabilitation Facilities with attached "Privacy Act Statement-Health Care Records" was provided and verbally reviewed with: Patient and Family  Emergency Contact Information Contact Information    Name Relation Home Work Courtney Chan [place]     Courtney Chan [phone]     [phone] Daughter Courtney Chan     Courtney Chan   [phone]     Current Medical History  Patient Admitting Diagnosis: CVA and debility following V. Tach arrest  History of Present Illness:: Courtney Chan is a 86 y.o. right-handed female with history of atrial fibrillation, hyperlipidemia, hypertension, CAD with PCI to the RCA and circumflex 2010.   Patient very active prior to admission.  Presented on 06/28/2019 with persistent chest discomfort and was brought to the  emergency room at [facility].  She was in the process of waiting in the waiting room when she became unresponsive and pulseless.  Patient appeared to be in ventricular tachycardia.  She did undergo defibrillation x1 with additional 3 to 4 minutes of CPR.  She was found to have non-STEMI with PCI completed per cardiology services.  On 06/29/2019 after extubation patient with altered mental status not following commands.  MRI and imaging showed small acute infarct involving the posterior aspect of the left superior frontal gyrus.  CT angiogram of head and neck with no emergent vascular findings.  Echocardiogram with ejection fraction of 60% without emboli.  EEG negative for seizure.  Neurology follow-up patient currently is maintained on Plavix as well as Pradaxa.  Tolerating a regular diet.    Past Medical History  Past Medical History:  Diagnosis Date  . Atrial fibrillation (HCC) 2011  . Hyperlipidemia   . Hypertension   . Stroke (HCC)   . Thyroid disease     Family History  family history includes Breast cancer in her maternal grandmother.  Prior Rehab/Hospitalizations:  Has the patient  had prior rehab or hospitalizations prior to admission? Yes  Has the patient had major surgery during 100 days prior to admission? Yes  Current Medications   Current Facility-Administered Medications:  .  acetaminophen (TYLENOL) 160 MG/5ML solution 650 mg, 650 mg, Oral, Q6H PRN, Laverda Page B, NP, 650 mg at 07/02/19 0802 .  atorvastatin (LIPITOR) tablet 80 mg, 80 mg, Oral, q1800, Marjie Skiff E, PA-C, 80 mg at 07/03/19 1728 .  chlorhexidine (PERIDEX) 0.12 % solution 15 mL, 15 mL, Mouth Rinse, BID, Agarwala, Ravi, MD, 15 mL at 07/04/19 1015 .  Chlorhexidine Gluconate Cloth 2 % PADS 6 each, 6 each, Topical, Daily, Lynnell Catalan, MD, 6 each at 07/04/19 1006 .  clopidogrel (PLAVIX) tablet 75 mg, 75 mg, Oral, Daily, Mosetta Anis, RPH, 75 mg at 07/04/19 0954 .  dabigatran (PRADAXA) capsule 150 mg, 150  mg, Oral, Q12H, Kathleene Hazel, MD, 150 mg at 07/04/19 0954 .  furosemide (LASIX) tablet 20 mg, 20 mg, Oral, Daily, Kathleene Hazel, MD, 20 mg at 07/04/19 0955 .  levothyroxine (SYNTHROID) tablet 88 mcg, 88 mcg, Oral, Q0600, Laverda Page B, NP, 88 mcg at 07/04/19 0540 .  lidocaine (LIDODERM) 5 % 1 patch, 1 patch, Transdermal, Q24H, Laverda Page B, NP, 1 patch at 07/04/19 [phone] .  losartan (COZAAR) tablet 12.5 mg, 12.5 mg, Oral, Daily, Bhagat, Bhavinkumar, PA, 12.5 mg at 07/04/19 0954 .  MEDLINE mouth rinse, 15 mL, Mouth Rinse, q12n4p, Agarwala, Ravi, MD, 15 mL at 07/03/19 1728 .  metoprolol succinate (TOPROL-XL) 24 hr tablet 150 mg, 150 mg, Oral, Daily, Bhagat, Bhavinkumar, PA, 150 mg at 07/04/19 0954 .  traMADol (ULTRAM) tablet 50 mg, 50 mg, Oral, Q6H PRN, Mosetta Anis, RPH, 50 mg at 07/03/19 1739  Patients Current Diet:  Diet Order            Diet regular Room service appropriate? Yes; Fluid consistency: Thin  Diet effective now              Precautions / Restrictions Precautions Precautions: Fall Precaution Comments: chest pain from compressions Restrictions Weight Bearing Restrictions: No   Has the patient had 2 or more falls or a fall with injury in the past year?No  Prior Activity Level Community (5-7x/wk): very independent and active  Prior Functional Level Prior Function Level of Independence: Independent Comments: pt has been very active and walks ~ 2 miles every morning with no device. she enjoys gardening and her and her husband both take care of the yard and cooking/cleaning. she is independent for all ADL's.  Self Care: Did the patient need help bathing, dressing, using the toilet or eating?  Independent  Indoor Mobility: Did the patient need assistance with walking from room to room (with or without device)? Independent  Stairs: Did the patient need assistance with internal or external stairs (with or without device)?  Independent  Functional Cognition: Did the patient need help planning regular tasks such as shopping or remembering to take medications? Independent  Home Assistive Devices / Equipment Home Assistive Devices/Equipment: None Home Equipment: Walker - 2 wheels, Cane - single point  Prior Device Use: Indicate devices/aids used by the patient prior to current illness, exacerbation or injury? None of the above  Current Functional Level Cognition  Overall Cognitive Status: Impaired/Different from baseline Orientation Level: Oriented X4 Following Commands: Follows one step commands consistently Safety/Judgement: Decreased awareness of deficits General Comments: pt presents with STM deficits and son present and also endorses this stating pt has been  asking similar questions twice. Overall has poor recall to current deficits    Extremity Assessment (includes Sensation/Coordination)  Upper Extremity Assessment: Generalized weakness  Lower Extremity Assessment: Defer to PT evaluation    ADLs  Overall ADL's : Needs assistance/impaired Eating/Feeding: Set up, Sitting Grooming: Min guard, Standing Upper Body Bathing: Minimal assistance, Sitting Lower Body Bathing: Minimal assistance, Moderate assistance, Sit to/from stand, Sitting/lateral leans Upper Body Dressing : Minimal assistance, Sitting Lower Body Dressing: Minimal assistance, Moderate assistance, Sit to/from stand Lower Body Dressing Details (indicate cue type and reason): limited from chest pain Toilet Transfer: Min guard, Regular Toilet, Grab bars, RW Toileting- Clothing Manipulation and Hygiene: Minimal assistance, Sit to/from stand Tub/ Banker: Minimal assistance, Ambulation, Shower seat, Rolling walker Functional mobility during ADLs: Min guard, Rolling walker, Cueing for safety General ADL Comments: pt needing safety cues to complete BADL. Also presents generally weak and fatigues easily from baseline    Mobility   Overal bed mobility: Needs Assistance Bed Mobility: Rolling, Sidelying to Sit Rolling: Min assist Sidelying to sit: Mod assist Supine to sit: HOB elevated, Min guard General bed mobility comments: up in recliner    Transfers  Overall transfer level: Needs assistance Equipment used: Rolling walker (2 wheeled) Transfers: Sit to/from Stand Sit to Stand: Min guard, Min assist Stand pivot transfers: Mod assist General transfer comment: assist to rise and steady, up to min A for balance    Ambulation / Gait / Stairs / Wheelchair Mobility  Ambulation/Gait Ambulation/Gait assistance: Mod assist, Min assist Gait Distance (Feet): (~60 ft total in room) Assistive device: 1 person hand held assist Gait Pattern/deviations: Decreased stride length, Step-through pattern, Drifts right/left General Gait Details: assist for balance; increased need for assistance with challenges Gait velocity: decreased    Posture / Balance Balance Overall balance assessment: Needs assistance Sitting-balance support: Feet supported Sitting balance-Leahy Scale: Fair Standing balance support: During functional activity, Single extremity supported Standing balance-Leahy Scale: Poor Standing balance comment: requires external support High level balance activites: Side stepping, Backward walking, Head turns High Level Balance Comments: pt requires assistance to maintain balance with horizontal head turns being most difficult and pt with lateral LOB    Special needs/care consideration BiPAP/CPAP Continuous Drip IV Dialysis        Life Vest Oxygen Special Bed Trach Size Wound Vac Skin                               Bowel mgmt:continent Bladder mgmt:external catheter Diabetic mgmt Hgb A1c 6.1 Behavioral consideration  Chemo/radiation  Designated visitor is spouse, Juanda Crumble   Previous Home Environment  Living Arrangements: Spouse/significant other  Lives With: Spouse, Family Available Help at Discharge:  Family, Available 24 hours/day Type of Home: House Home Layout: One level Home Access: Level entry Bathroom Shower/Tub: Chiropodist: Standard Bathroom Accessibility: Yes Home Care Services: No  Discharge Living Setting Plans for Discharge Living Setting: Patient's home, Lives with (comment)(spouse) Type of Home at Discharge: House Discharge Home Layout: One level Discharge Home Access: Level entry Discharge Bathroom Shower/Tub: Tub/shower unit Discharge Bathroom Toilet: Standard Discharge Bathroom Accessibility: Yes How Accessible: Accessible via walker Does the patient have any problems obtaining your medications?: No  Social/Family/Support Systems Patient Roles: Spouse, Parent Contact Information: spouse and son Anticipated Caregiver: family Anticipated Caregiver's Contact Information: see above Ability/Limitations of Caregiver: no limits Caregiver Availability: 24/7 Discharge Plan Discussed with Primary Caregiver: Yes Is Caregiver In Agreement with Plan?: Yes  Does Caregiver/Family have Issues with Lodging/Transportation while Pt is in Rehab?: No   Goals/Additional Needs Patient/Family Goal for Rehab: Mod I to supervision with PT and OT Expected length of stay: ELOS 10 to 14 days, but patient and family want to get home sooner for they feel she will do better at home once at a manageable level Pt/Family Agrees to Admission and willing to participate: Yes Program Orientation Provided & Reviewed with Pt/Caregiver Including Roles  & Responsibilities: Yes  Decrease burden of Care through IP rehab admission:   Possible need for SNF placement upon discharge:  Patient Condition: This patient's condition remains as documented in the consult dated 07/02/2019, in which the Rehabilitation Physician determined and documented that the patient's condition is appropriate for intensive rehabilitative care in an inpatient rehabilitation facility. Will admit to inpatient  rehab today.  Preadmission Screen Completed By:  Clois Dupes, RN, 07/04/2019 10:36 AM ______________________________________________________________________   Discussed status with Dr. Allena Katz on 07/04/2019 at  1037 and received approval for admission today.  Admission Coordinator:  Clois Dupes, time 2010 Date 07/04/2019

## 2019-07-02 NOTE — Progress Notes (Signed)
Inpatient Rehabilitation Admissions Coordinator  I contacted patient by phone and spoke with her and her son, Carney Bern at bedside. We discussed goals and expectations of an inpt rehab admit. They want to discuss with other family members before final decision, but want to get her some rehab and then get home as soon as possible. I will begin insurance authorization with [facility] and follow up tomorrow.  Ottie Glazier, RN, MSN Rehab Admissions Coordinator [phone] 07/02/2019 1:32 PM

## 2019-07-02 NOTE — Progress Notes (Signed)
Patient is alert and oriented x 4. Requested that she would like to walk in the hallway with son nearby. Patient walked from patient's room to nurses station and back without difficulty using front wheel walker. Patient is currently resting.

## 2019-07-03 LAB — CBC
HCT: 35.3 % — ABNORMAL LOW (ref 36.0–46.0)
Hemoglobin: 11.1 g/dL — ABNORMAL LOW (ref 12.0–15.0)
MCH: 30.1 pg (ref 26.0–34.0)
MCHC: 31.4 g/dL (ref 30.0–36.0)
MCV: 95.7 fL (ref 80.0–100.0)
Platelets: 359 10*3/uL (ref 150–400)
RBC: 3.69 MIL/uL — ABNORMAL LOW (ref 3.87–5.11)
RDW: 14.4 % (ref 11.5–15.5)
WBC: 9 10*3/uL (ref 4.0–10.5)
nRBC: 0 % (ref 0.0–0.2)

## 2019-07-03 LAB — BASIC METABOLIC PANEL
Anion gap: 8 (ref 5–15)
BUN: 20 mg/dL (ref 8–23)
CO2: 27 mmol/L (ref 22–32)
Calcium: 8.7 mg/dL — ABNORMAL LOW (ref 8.9–10.3)
Chloride: 108 mmol/L (ref 98–111)
Creatinine, Ser: 0.86 mg/dL (ref 0.44–1.00)
GFR calc Af Amer: >60 mL/min (ref >60)
GFR calc non Af Amer: >60 mL/min (ref >60)
Glucose, Bld: 116 mg/dL — ABNORMAL HIGH (ref 70–99)
Potassium: 4.2 mmol/L (ref 3.5–5.1)
Sodium: 143 mmol/L (ref 135–145)

## 2019-07-03 MED ORDER — METOPROLOL SUCCINATE ER 50 MG PO TB24
150.0000 mg | ORAL_TABLET | Freq: Every day | ORAL | Status: DC
  Administered 2019-07-04: 150 mg via ORAL
  Filled 2019-07-03: qty 1

## 2019-07-03 MED ORDER — LOSARTAN POTASSIUM 25 MG PO TABS
12.5000 mg | ORAL_TABLET | Freq: Every day | ORAL | Status: DC
  Administered 2019-07-04: 12.5 mg via ORAL
  Filled 2019-07-03: qty 1

## 2019-07-03 NOTE — Progress Notes (Signed)
Inpatient Rehabilitation Admissions Coordinator  I continue to await insurance approval for a possible inpt rehab admit Saturday pending their approval. I am on call to receive decision today through tomorrow. I met with patient and her son , Kela Millin, to discuss plans. I will follow up tomorrow to let acute team know if insurance will approve admit to CIR Saturday.  Danne Baxter, RN, MSN Rehab Admissions Coordinator [phone] 07/03/2019 6:24 PM

## 2019-07-03 NOTE — Progress Notes (Signed)
  Speech Language Pathology Treatment: Dysphagia  Patient Details Name: Courtney Chan MRN: [id number] DOB: 07/23/1930 Today's Date: 07/03/2019 Time: 3403-5248 SLP Time Calculation (min) (ACUTE ONLY): 11 min  Assessment / Plan / Recommendation Clinical Impression  Pt was seen for dysphagia treatment and was cooperative throughout the session. Pt, nursing, and his son reported that the pt has been tolerating the current diet without overt s/sx of aspiration. Pt tolerated regular texture solids and thin liquids via straw using individual and consecutive swallows without symptoms of oropharyngeal dysphagia. It is recommended that the current diet be continued. Further skilled SLP services are not clinically indicated at this time.    HPI HPI: Pt is an 86 year old female with history of dyslipidemia atrial fibrillation essential hypertension CKD, hypothyroidism, who reportedly started having chest pain day prior to admission. She was admitted in an unresponsive state and was found to have V. fib arrest status post ACLS with defibrillation and ROSC. Chest x-ray revealed minimal improvement in aeration. Interstitial and airspace disease, at least partially felt to represent interstitial edema and atelectasis. Infection at the lung bases cannot be excluded. EEG of 2/15: severe diffuse encephalopathy. Intubated 2/14-2/15; on 2/15 found unresponsive and code stroke was called. 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 Encephalomalacia and gliosis involving the posterior right insula extending into the right temporal lobe.      SLP Plan  Discharge SLP treatment due to (comment);All goals met       Recommendations  Diet recommendations: Regular;Thin liquid Medication Administration: Whole meds with liquid Supervision: Patient able to self feed                Oral Care Recommendations: Oral care BID Follow up Recommendations: None SLP Visit  Diagnosis: Dysphagia, unspecified (R13.10) Plan: Discharge SLP treatment due to (comment);All goals met       Amorah Sebring I. Hardin Negus, [place], [place] Office number [phone] Pager [place] 07/03/2019, 11:31 AM

## 2019-07-03 NOTE — Progress Notes (Signed)
Patient c/o sternal chest pain and coughed up red & dark bloody sputum this morning. Made cardiology aware.

## 2019-07-03 NOTE — Discharge Summary (Addendum)
Discharge Summary    Patient ID: Courtney Chan MRN: [id number]; DOB: May 20, 1930  Admit date: 06/28/2019 Discharge date: 07/04/2019  Primary Care Provider: Marisue Ivan, MD  Primary Cardiologist: Courtney Chan [facility])  Discharge Diagnoses    Principal Problem:   Cardiac arrest [facility]) Active Problems:   Persistent atrial fibrillation [facility])   Atrial fibrillation with RVR (HCC)   Essential hypertension   Pure hypercholesterolemia   Ventilator dependent (HCC)   Non-ST elevation (NSTEMI) myocardial infarction [facility])    Diagnostic Studies/Procedures    Coronary/Graft Acute MI Revascularization  06/28/19  LEFT HEART CATH AND CORONARY ANGIOGRAPHY  Conclusion    Previously placed Prox Cx to Mid Cx stent (unknown type) is widely patent -the lesion began just distal to the stent  CULPRIT LESION: Mid Cx lesion is 95% stenosed.  A drug-eluting stent was successfully placed in order to overlap the previously placed stent, using a STENT RESOLUTE ONYX 2.0X15. Postdilated to 2.3 mm - 2.1 mm  Post intervention, there is a 0% residual stenosis.  -----------------------------------  Ost RCA to Prox RCA lesion is 30% stenosed. Previously placed Prox RCA to Mid RCA stent (unknown type) is widely patent.  Mid LAD lesion is 30% stenosed.  -----------------------------------  There is moderate to severe left ventricular systolic dysfunction. The Left Ventricular Ejection Fraction is 25-35% by visual estimate.  LV end diastolic pressure is moderately elevated. There is mild (2+) mitral regurgitation.  SUMMARY  Severe single-vessel CAD with 95% stenosis in mid-distal LCx beyond previously placed stent ? Successful DES PCI of LCx with stent overlapping proximal stent (resolute Onyx DES 2.0 mm x 15 mm--2.3 mm)  Otherwise mild to moderate 30% ostial and proximal RCA and mid LAD stenosis.  ACUTE COMBINED SYSTOLIC AND DIASTOLIC HEART FAILURE: Severely reduced LVEF with  Takotsubo pattern, EF estimated 25 to 30%; Moderately elevated LVEDP of 20 mmHg  Persistent atrial fibrillation, rates notably improved following PCI  Borderline hypotension with improved blood pressures following PCI-no evidence of Cardiogenic Shock  Resolution of Right Bundle Branch Block  Evidence of neurologic recovery with the patient opening eyes and following commands while intubated. ->Follows finger with eyes, squeezes hands.   RECOMMENDATIONS  Based on bed availability, the patient will be transferred to [facility] CVICU (2 Heart).  With A. fib, will start IV heparin 8 hours after sheath removal and convert back to Pradaxa in the morning  Have ordered 2D echocardiogram, suspect reduced EF is partly related to stress cardiomyopathy from cardiac arrest as the reduced EF is well at proportion with her coronary disease.  Unclear if the patient has permanent A. fib versus paroxysmal, did not convert with defibrillation -> was on digoxin and high-dose metoprolol prior to arrival, neither have been ordered until the patient's blood pressure stabilized, would probably start with low-dose metoprolol in the morning.  Have not ordered other antihypertensives due to borderline blood pressures.   The patient will be cared for under the [facility] HEART CARE service and upon discharge will be returned to the care of Dr. Jamse Chan.   Diagnostic Dominance: Right  Intervention    Echo 06/19/19 1. Left ventricular ejection fraction, by estimation, is 55 to 60%. The  left ventricle has normal function. The left ventricle has no regional  wall motion abnormalities. Left ventricular diastolic function could not  be evaluated.  2. Right ventricular systolic function is normal. The right ventricular  size is normal. There is mildly elevated pulmonary artery systolic  pressure.  3. The mitral  valve is degenerative. Mild mitral valve regurgitation. No  evidence of mitral  stenosis.  4. Tricuspid valve regurgitation is mild to moderate.  5. The aortic valve is tricuspid. Aortic valve regurgitation is not  visualized. Mild to moderate aortic valve sclerosis/calcification is  present, without any evidence of aortic stenosis.      History of Present Illness     Courtney Chan is a 86 y.o. female with with history of CAD, HTN, HLD, CKD, persistent atrial fib on Pradaxa, prior CVA, hypothyroidism admitted to [facility] 06/28/19 with a NSTEMI followed by cardiac arrest in the ED. She was resuscitated and transferred.  Hx of CAD with stents in April 2010 to the RCA and circumflex.  Last stress test was in February 2019 with GXT walked 6 minutes with no ischemic changes.  Patient had her second COVID-19 vaccine on Friday, June 26, 2019, and was otherwise doing well in the usual state of health.  She started having chest discomfort the evening of February 13 leading into the morning of February 14.  Intermittent chest discomfort overnight which brought her to the emergency room at [facility] presenting at roughly 12:50 PM. While in waiting room she suddenly became unresponsive. When she was placed on the monitor she was found to be pulseless and then what appeared to be ventricular tachycardia.  She did undergo defibrillation x1 with additional 3 to 4 minutes of CPR with ROSC with afib. Noted elevated troponin. She was intubated and sedated. BP improved on fluids. She was taken to cath lab at [facility] then transferred to [facility] as no ICU bed at [facility].   Hospital Course     Consultants: Critical care and neurology  Emergent cath showed severe single-vessel CAD s/p PCI of Lcx. Otherwise non obstructive CAD. Treated with heparin and Plavix. She was transferred to [facility] intubated for further care. Extubated at 2/15 followed by mental status changes. No acute finding on CT head. MR of brain showed Small left MCA frontal gyrus infarct felt to be embolic by Neurology team.   1.  Cardiac arrest/NSTEMI - Emergent cath showed severe single-vessel CAD with 95% stenosis in mid-distal LCx beyond previously placed stent s/p successful DES PCI of LCx with stent overlapping proximal stent (resolute Onyx DES 2.0 mm x 15 mm--2.3 mm) - Echo with preserved LVEF - continued to have chest wall pain (due to CPR) >improved with heat pad - Off ASA since on anticoagulation - Continue Plavix statin and beta-blocker  2.  Persistent atrial fibrillation -Metoprolol changed to Toprol this admission  -Rate in 80-110s. Increased Toprol XL to 150mg  qd -On Pradaxa for anticoagulation  3. CVA - Small left MCA frontal gyrus infarct felt to be embolic by Neurology team. Recommendation to continue anti-coagulation.Continue Pradaxa.   4.  Chronic diastolic heart failure -Remained euvolemic at the time of discharge.  Net I&O positive. Weigh down 8lb (157>>149lb). Euvolemic. - Continue  Lasix p.o. 20 mg.  5.  Hypertension -Blood pressure stable  - Increased Toprol XL to 150mg  qd - Reduced Losartan to 12.5mg  qd  6.  Hyperlipidemia - 07/01/2019: Cholesterol 122; HDL 35; LDL Cholesterol 61; Triglycerides 128; VLDL 26  - Continue high intensity statin  7.  Hemoptysis - isolated episode. No fever, chills.  Lungs clear.  Afebrile. - Likely related to intubation and CPR  She was evaluated by PT with recommendations for CIR. Chart reviewed and accepted to CIR with insurance approval.   Did the patient have an acute coronary syndrome (MI, NSTEMI,  STEMI, etc) this admission?:  Yes                               AHA/ACC Clinical Performance & Quality Measures: 1. Aspirin prescribed? - No - on OAC, not a good candidate for triple therapy 2. ADP Receptor Inhibitor (Plavix/Clopidogrel, Brilinta/Ticagrelor or Effient/Prasugrel) prescribed (includes medically managed patients)? - Yes 3. Beta Blocker prescribed? - Yes 4. High Intensity Statin (Lipitor 40-80mg  or Crestor 20-40mg ) prescribed? -  Yes 5. EF assessed during THIS hospitalization? - Yes 6. For EF <40%, was ACEI/ARB prescribed? - Yes 7. For EF <40%, Aldosterone Antagonist (Spironolactone or Eplerenone) prescribed? - Not Applicable (EF >/= 40%) 8. Cardiac Rehab Phase II ordered (Included Medically managed Patients)? - Yes   _____________  Discharge Vitals Blood pressure 109/60, pulse (!) 107, temperature (!) 97.4 F (36.3 C), temperature source Oral, resp. rate 16, height 5\' 4"  (1.626 m), weight 68.5 kg, SpO2 95 %.  Filed Weights   07/02/19 0400 07/03/19 0439 07/04/19 0457  Weight: 66.9 kg 67.8 kg 68.5 kg    Labs & Radiologic Studies    CBC Recent Labs    07/02/19 0021 07/03/19 0435  WBC 10.6* 9.0  HGB 11.5* 11.1*  HCT 36.1 35.3*  MCV 95.3 95.7  PLT 344 359   Basic Metabolic Panel Recent Labs    45/40/9802/18/21 0021 07/03/19 0435  NA 140 143  K 3.1* 4.2  CL 100 108  CO2 27 27  GLUCOSE 149* 116*  BUN 21 20  CREATININE 1.15* 0.86  CALCIUM 8.9 8.7*   High Sensitivity Troponin:   Recent Labs  Lab 06/28/19 1302 06/28/19 1522  TROPONINIHS 11,576* 13,408*   Fasting Lipid Panel No results for input(s): CHOL, HDL, LDLCALC, TRIG, CHOLHDL, LDLDIRECT in the last 72 hours. _____________  CT ANGIO HEAD W OR WO CONTRAST  Result Date: 06/29/2019 CLINICAL DATA:  Stroke follow-up EXAM: CT ANGIOGRAPHY HEAD AND NECK TECHNIQUE: Multidetector CT imaging of the head and neck was performed using the standard protocol during bolus administration of intravenous contrast. Multiplanar CT image reconstructions and MIPs were obtained to evaluate the vascular anatomy. Carotid stenosis measurements (when applicable) are obtained utilizing NASCET criteria, using the distal internal carotid diameter as the denominator. CONTRAST:  Dose is currently not available COMPARISON:  None. FINDINGS: CTA NECK FINDINGS Aortic arch: Atherosclerotic plaque.  Two vessel branching. Right carotid system: Mild to moderate mainly calcified plaque at  the bifurcation/bulb. No flow limiting stenosis or ulceration. Left carotid system: Atheromatous plaque of the distal common carotid which is mild for age. No flow limiting stenosis or ulceration. Vertebral arteries: Proximal subclavian atherosclerosis on the left more than right without flow limiting stenosis or ulceration. Codominant vertebral arteries that show robust flow to the dura. Skeleton: Degenerative changes without acute or aggressive finding. Other neck: No evidence of mass or inflammation Upper chest: Ground-glass and streaky opacity in the right more than left lung. No opacification at the left atrial appendage. Review of the MIP images confirms the above findings CTA HEAD FINDINGS Anterior circulation: Atherosclerotic plaque on the carotid siphons. No branch occlusion or flow limiting stenosis. Negative for aneurysm Posterior circulation: Vertebral and basilar arteries are smooth and widely patent. No branch occlusion or aneurysm. Venous sinuses: Unremarkable Anatomic variants: None significant Review of the MIP images confirms the above findings IMPRESSION: 1. No emergent vascular finding 2. Atherosclerosis without flow limiting stenosis or ulceration in the head and neck. 3. Non  opacified left atrial appendage in the setting of atrial fibrillation that could be delayed filling versus thrombus. 4. Right more than left pneumonia and atelectasis. Electronically Signed   By: Marnee Spring M.D.   On: 06/29/2019 10:29   DG Chest 2 View  Result Date: 06/28/2019 CLINICAL DATA:  Chest pain shortness of breath EXAM: CHEST - 2 VIEW COMPARISON:  August 25, 2017 FINDINGS: The mediastinal contour and cardiac silhouette are normal. Mild opacity of left lung base is identified at least in part due to atelectasis but superimposed early pneumonia is not excluded. There is no pulmonary edema or pleural effusion. The bony structures are stable. IMPRESSION: Mild opacity of left lung base at least in part due to  atelectasis but superimposed early pneumonia is not excluded. Electronically Signed   By: Sherian Rein M.D.   On: 06/28/2019 14:41   DG Abd 1 View  Result Date: 06/29/2019 CLINICAL DATA:  NG tube placement. EXAM: ABDOMEN - 1 VIEW COMPARISON:  06/28/2019 FINDINGS: The tip of the NG tube is in the midbody of the stomach. The side hole is near the gastroesophageal junction. Bowel gas pattern is normal. Contrast is seen in the nondistended renal collecting systems. IMPRESSION: NG tube tip in the body of the stomach. Electronically Signed   By: Francene Boyers M.D.   On: 06/29/2019 14:59   CT Head Wo Contrast  Result Date: 06/28/2019 CLINICAL DATA:  Cardiac arrest, unresponsive and pulseless upon arrival to ED, intubated EXAM: CT HEAD WITHOUT CONTRAST TECHNIQUE: Contiguous axial images were obtained from the base of the skull through the vertex without intravenous contrast. COMPARISON:  06/28/2019 FINDINGS: Brain: Chronic small vessel ischemic changes are seen within the bilateral frontal periventricular white matter and left external capsule. No acute infarct or hemorrhage. Lateral ventricles and remaining midline structures are unremarkable. There are no acute extra-axial fluid collections. There is no mass effect. Vascular: No hyperdense vessel or unexpected calcification. Skull: Normal. Negative for fracture or focal lesion. Sinuses/Orbits: No acute finding. Other: None IMPRESSION: 1. Stable head CT, no acute intracranial process. Electronically Signed   By: Sharlet Salina M.D.   On: 06/28/2019 15:57   CT ANGIO NECK W OR WO CONTRAST  Result Date: 06/29/2019 CLINICAL DATA:  Stroke follow-up EXAM: CT ANGIOGRAPHY HEAD AND NECK TECHNIQUE: Multidetector CT imaging of the head and neck was performed using the standard protocol during bolus administration of intravenous contrast. Multiplanar CT image reconstructions and MIPs were obtained to evaluate the vascular anatomy. Carotid stenosis measurements (when  applicable) are obtained utilizing NASCET criteria, using the distal internal carotid diameter as the denominator. CONTRAST:  Dose is currently not available COMPARISON:  None. FINDINGS: CTA NECK FINDINGS Aortic arch: Atherosclerotic plaque.  Two vessel branching. Right carotid system: Mild to moderate mainly calcified plaque at the bifurcation/bulb. No flow limiting stenosis or ulceration. Left carotid system: Atheromatous plaque of the distal common carotid which is mild for age. No flow limiting stenosis or ulceration. Vertebral arteries: Proximal subclavian atherosclerosis on the left more than right without flow limiting stenosis or ulceration. Codominant vertebral arteries that show robust flow to the dura. Skeleton: Degenerative changes without acute or aggressive finding. Other neck: No evidence of mass or inflammation Upper chest: Ground-glass and streaky opacity in the right more than left lung. No opacification at the left atrial appendage. Review of the MIP images confirms the above findings CTA HEAD FINDINGS Anterior circulation: Atherosclerotic plaque on the carotid siphons. No branch occlusion or flow limiting stenosis. Negative for  aneurysm Posterior circulation: Vertebral and basilar arteries are smooth and widely patent. No branch occlusion or aneurysm. Venous sinuses: Unremarkable Anatomic variants: None significant Review of the MIP images confirms the above findings IMPRESSION: 1. No emergent vascular finding 2. Atherosclerosis without flow limiting stenosis or ulceration in the head and neck. 3. Non opacified left atrial appendage in the setting of atrial fibrillation that could be delayed filling versus thrombus. 4. Right more than left pneumonia and atelectasis. Electronically Signed   By: Marnee Spring M.D.   On: 06/29/2019 10:29   MR BRAIN WO CONTRAST  Result Date: 06/30/2019 CLINICAL DATA:  Stroke follow-up EXAM: MRI HEAD WITHOUT CONTRAST TECHNIQUE: Multiplanar, multiecho pulse  sequences of the brain and surrounding structures were obtained without intravenous contrast. COMPARISON:  Head CT June 28, 2019 FINDINGS: Brain: A focus of restricted diffusion is seen in the posterior aspect of the left superior frontal gyrus, consistent with an acute infarct. Area of encephalomalacia and gliosis is seen involving the posterior right insula extending into the right temporal lobe. Scattered and confluent foci of T2 hyperintensity are seen within the white matter of the cerebral hemispheres, nonspecific, most likely related to chronic small vessel ischemia. Prominence of the supratentorial ventricles, cerebral and cerebellar sulci reflecting parenchymal volume loss. No hemorrhage, extra-axial collection or mass lesion. Vascular: Normal flow voids. Skull and upper cervical spine: Normal marrow signal. Sinuses/Orbits: Bilateral lens surgery noted. Visualized sinuses are clear. Other: None. IMPRESSION: 1. Small acute infarct involving the posterior aspect of the left superior frontal gyrus. 2. Encephalomalacia and gliosis involving the posterior right insula extending into the right temporal lobe. 3. Chronic small vessel ischemia and parenchymal volume loss. Electronically Signed   By: Baldemar Lenis M.D.   On: 06/30/2019 13:32   CARDIAC CATHETERIZATION  Result Date: 06/28/2019  Previously placed Prox Cx to Mid Cx stent (unknown type) is widely patent -the lesion began just distal to the stent  CULPRIT LESION: Mid Cx lesion is 95% stenosed.  A drug-eluting stent was successfully placed in order to overlap the previously placed stent, using a STENT RESOLUTE ONYX 2.0X15. Postdilated to 2.3 mm - 2.1 mm  Post intervention, there is a 0% residual stenosis.  -----------------------------------  Ost RCA to Prox RCA lesion is 30% stenosed. Previously placed Prox RCA to Mid RCA stent (unknown type) is widely patent.  Mid LAD lesion is 30% stenosed.   -----------------------------------  There is moderate to severe left ventricular systolic dysfunction. The Left Ventricular Ejection Fraction is 25-35% by visual estimate.  LV end diastolic pressure is moderately elevated. There is mild (2+) mitral regurgitation.  SUMMARY  Severe single-vessel CAD with 95% stenosis in mid-distal LCx beyond previously placed stent  Successful DES PCI of LCx with stent overlapping proximal stent (resolute Onyx DES 2.0 mm x 15 mm--2.3 mm)  Otherwise mild to moderate 30% ostial and proximal RCA and mid LAD stenosis.  ACUTE COMBINED SYSTOLIC AND DIASTOLIC HEART FAILURE: Severely reduced LVEF with Takotsubo pattern, EF estimated 25 to 30%; Moderately elevated LVEDP of 20 mmHg  Persistent atrial fibrillation, rates notably improved following PCI  Borderline hypotension with improved blood pressures following PCI-no evidence of Cardiogenic Shock  Resolution of Right Bundle Branch Block  Evidence of neurologic recovery with the patient opening eyes and following commands while intubated. ->  Follows finger with eyes, squeezes hands. RECOMMENDATIONS  Based on bed availability, the patient will be transferred to [facility] CVICU (2 Heart).  With A. fib, will start IV  heparin 8 hours after sheath removal and convert back to Pradaxa in the morning  Have ordered 2D echocardiogram, suspect reduced EF is partly related to stress cardiomyopathy from cardiac arrest as the reduced EF is well at proportion with her coronary disease.  Unclear if the patient has permanent A. fib versus paroxysmal, did not convert with defibrillation -> was on digoxin and high-dose metoprolol prior to arrival, neither have been ordered until the patient's blood pressure stabilized, would probably start with low-dose metoprolol in the morning.  Have not ordered other antihypertensives due to borderline blood pressures. The patient will be cared for under the [place] service and upon  discharge will be returned to the care of Dr. Miquel Dunn. Glenetta Hew, MD  DG Chest Port 1 View  Result Date: 07/01/2019 CLINICAL DATA:  Shortness of breath.  CHF. EXAM: PORTABLE CHEST 1 VIEW COMPARISON:  06/30/2019 FINDINGS: Normal heart size. Probable small bilateral pleural effusions. No pneumothorax. Extremely low lung volumes. Perihilar and basilar predominant interstitial and airspace disease is felt to be slightly improved. IMPRESSION: Minimal improvement in aeration. Interstitial and airspace disease, at least partially felt to represent interstitial edema and atelectasis. Infection at the lung bases cannot be excluded. Electronically Signed   By: Abigail Miyamoto M.D.   On: 07/01/2019 09:42   DG Chest Port 1 View  Result Date: 06/30/2019 CLINICAL DATA:  Tachypnea.  Recent stent placement EXAM: PORTABLE CHEST 1 VIEW COMPARISON:  Two days ago FINDINGS: The endotracheal tube is no longer seen. Lung volumes are lower and there is increased diffuse interstitial opacity. The enteric tube reaches the stomach. Stable heart size and mediastinal contours when allowing for overlapping defibrillator pad. No visible effusion or pneumothorax IMPRESSION: Low volume chest with increased interstitial opacity, likely edema and atelectasis. Electronically Signed   By: Monte Fantasia M.D.   On: 06/30/2019 05:39   DG CHEST PORT 1 VIEW  Result Date: 06/28/2019 CLINICAL DATA:  Post cardiac arrest earlier today. EXAM: PORTABLE CHEST 1 VIEW COMPARISON:  Chest x-rays from earlier same day. FINDINGS: Endotracheal tube is well positioned with tip just above the level of the carina. Enteric tube passes below the diaphragm. Pacer pads overlie the heart. There is persistent central pulmonary vascular congestion and RIGHT suprahilar edema. Probable atelectasis at the LEFT lung base. No pleural effusion or pneumothorax is seen. IMPRESSION: 1. No significant change compared to today's earlier chest x-ray. Persistent central  pulmonary vascular congestion and RIGHT suprahilar pulmonary edema. No pleural effusion or pneumothorax. 2. Endotracheal tube well positioned with tip just above the level of the carina. Electronically Signed   By: Franki Cabot M.D.   On: 06/28/2019 20:53   DG Chest Portable 1 View  Result Date: 06/28/2019 CLINICAL DATA:  Status post arrest, intubated EXAM: PORTABLE CHEST 1 VIEW COMPARISON:  06/28/2019 FINDINGS: Single frontal view of the chest demonstrates defibrillator pads overlying left chest. Endotracheal tube overlies tracheal air column, tip just below thoracic inlet. Enteric catheter passes below diaphragm, tip excluded by collimation. Numerous cardiac leads overlie the chest. Cardiac silhouette is stable. Since the prior exam, diffuse interstitial and ground-glass opacities have developed consistent with pulmonary edema. Small left pleural effusion is noted. There is no pneumothorax on this supine exam. No acute displaced fractures. IMPRESSION: 1. Support devices as above.  No evidence of malpositioning. 2. Interval development of mild pulmonary edema. Electronically Signed   By: Randa Ngo M.D.   On: 06/28/2019 15:13   DG Abd Portable 1 View  Result Date: 06/28/2019 CLINICAL DATA:  Intubated, enteric catheter placement, arrest EXAM: PORTABLE ABDOMEN - 1 VIEW COMPARISON:  None. FINDINGS: Frontal view of the lower chest and upper abdomen demonstrates enteric catheter tip and side port projecting over gastric body. Numerous cardiac leads are identified. Defibrillator pads overlie the left chest. Bowel gas pattern is unremarkable. Interstitial and ground-glass opacities and small left pleural effusion consistent with pulmonary edema. IMPRESSION: 1. Enteric catheter overlying gastric body. 2. Pulmonary edema. Electronically Signed   By: Sharlet Salina M.D.   On: 06/28/2019 15:14   EEG adult  Result Date: 06/29/2019 Charlsie Quest, MD     06/29/2019  2:00 PM Patient Name: Courtney Chan MRN:  [id number] Epilepsy Attending: Charlsie Quest Referring Physician/Provider: Dr. Marvel Plan Date: 06/29/2019 Duration: 25.15 minutes Patient history: 86 year old female with history of prior stroke now status post cardiac arrest and seizure-like activity.  EEG to evaluate for seizures. Level of alertness: Comatose AEDs during EEG study: Keppra, lorazepam Technical aspects: This EEG study was done with scalp electrodes positioned according to the 10-20 International system of electrode placement. Electrical activity was acquired at a sampling rate of 500Hz  and reviewed with a high frequency filter of 70Hz  and a low frequency filter of 1Hz . EEG data were recorded continuously and digitally stored. Description: EEG showed continuous generalized low amplitude 2 to 3 Hz delta slowing as well as intermittent 5 to 6 Hz generalized theta slowing.  EEG was reactive to noxious stimuli.  Hyperventilation and photic stimulation were not performed. Abnormality -Continuous slow, generalized IMPRESSION: This study is suggestive of severe diffuse encephalopathy, nonspecific etiology. No seizures or epileptiform discharges were seen throughout the recording.   ECHOCARDIOGRAM COMPLETE  Result Date: 06/29/2019    ECHOCARDIOGRAM REPORT   Patient Name:   Courtney Chan Date of Exam: 06/29/2019 Medical Rec #:  07/01/2019        Height:       64.0 in Accession #:    [id number] Moat       Weight:       157.2 lb Date of Birth:  04-17-1931         BSA:          1.77 m Patient Age:    86 years         BP:           95/52 mmHg Patient Gender: F                HR:           86 bpm. Exam Location:  Inpatient Procedure: 2D Echo Indications:    Acute myocardial infarction 410  History:        Patient has no prior history of Echocardiogram examinations.                 CAD, Stroke, Arrythmias:Atrial Fibrillation; Risk                 Factors:Hypertension and Dyslipidemia. Chronic kidney disease.                 V. fib arrest. Awake  and altert while intubated, when extubated                 altered mental status.  Sonographer:    [id number] RDCS Referring Phys: [id number] STEPHANIE FALK MARTIN IMPRESSIONS  1. Left ventricular ejection fraction, by estimation, is 55 to 60%. The left ventricle has normal function. The left ventricle  has no regional wall motion abnormalities. Left ventricular diastolic function could not be evaluated.  2. Right ventricular systolic function is normal. The right ventricular size is normal. There is mildly elevated pulmonary artery systolic pressure.  3. The mitral valve is degenerative. Mild mitral valve regurgitation. No evidence of mitral stenosis.  4. Tricuspid valve regurgitation is mild to moderate.  5. The aortic valve is tricuspid. Aortic valve regurgitation is not visualized. Mild to moderate aortic valve sclerosis/calcification is present, without any evidence of aortic stenosis. Comparison(s): No prior Echocardiogram. FINDINGS  Left Ventricle: Left ventricular ejection fraction, by estimation, is 55 to 60%. The left ventricle has normal function. The left ventricle has no regional wall motion abnormalities. There is no left ventricular hypertrophy. The left ventricular diastology could not be evaluated due to atrial fibrillation. Left ventricular diastolic function could not be evaluated. Right Ventricle: The right ventricular size is normal. No increase in right ventricular wall thickness. Right ventricular systolic function is normal. There is mildly elevated pulmonary artery systolic pressure. Left Atrium: Left atrial size was normal in size. Right Atrium: Right atrial size was normal in size. Pericardium: There is no evidence of pericardial effusion. Presence of pericardial fat pad. Mitral Valve: The mitral valve is degenerative in appearance. Severe mitral annular calcification. Mild mitral valve regurgitation. No evidence of mitral valve stenosis. Tricuspid Valve: The tricuspid valve is grossly  normal. Tricuspid valve regurgitation is mild to moderate. Aortic Valve: The aortic valve is tricuspid. . There is moderate thickening and moderate calcification of the aortic valve. Aortic valve regurgitation is not visualized. Mild to moderate aortic valve sclerosis/calcification is present, without any evidence of aortic stenosis. There is moderate thickening of the aortic valve. There is moderate calcification of the aortic valve. Pulmonic Valve: The pulmonic valve was grossly normal. Pulmonic valve regurgitation is trivial. Aorta: The aortic root and ascending aorta are structurally normal, with no evidence of dilitation. Venous: IVC assessment for right atrial pressure unable to be performed due to mechanical ventilation. IAS/Shunts: No atrial level shunt detected by color flow Doppler.  LEFT VENTRICLE PLAX 2D LVIDd:         3.23 cm  Diastology LVIDs:         1.70 cm  LV e' lateral:   5.84 cm/s LV PW:         1.11 cm  LV E/e' lateral: 18.2 LV IVS:        1.12 cm  LV e' medial:    6.20 cm/s LVOT diam:     1.80 cm  LV E/e' medial:  17.2 LV SV:         30.79 ml LV SV Index:   18.50 LVOT Area:     2.54 cm  RIGHT VENTRICLE RVOT diam:      2.20 cm TAPSE (M-mode): 1.0 cm LEFT ATRIUM             Index       RIGHT ATRIUM           Index LA diam:        3.30 cm 1.87 cm/m  RA Area:     20.50 cm LA Vol (A2C):   43.3 ml 24.52 ml/m RA Volume:   56.80 ml  32.16 ml/m LA Vol (A4C):   49.1 ml 27.80 ml/m LA Biplane Vol: 46.9 ml 26.56 ml/m  AORTIC VALVE LVOT Vmax:   64.30 cm/s LVOT Vmean:  40.800 cm/s LVOT VTI:    0.121 m  AORTA Ao Root diam:  2.70 cm Ao Asc diam:  2.80 cm MITRAL VALVE                TRICUSPID VALVE MV Area (PHT): 5.32 cm     TR Peak grad:   30.2 mmHg MV Decel Time: 143 msec     TR Vmax:        275.00 cm/s MV E velocity: 106.33 cm/s                             SHUNTS                             Systemic VTI:  0.12 m                             Systemic Diam: 1.80 cm                             Pulmonic  Diam: 2.20 cm Lennie Odor MD Electronically signed by Lennie Odor MD Signature Date/Time: 06/29/2019/3:24:25 PM    Final    CT HEAD CODE STROKE WO CONTRAST  Result Date: 06/29/2019 CLINICAL DATA:  Code stroke.  Change in mental status EXAM: CT HEAD WITHOUT CONTRAST TECHNIQUE: Contiguous axial images were obtained from the base of the skull through the vertex without intravenous contrast. COMPARISON:  Head CT from yesterday FINDINGS: Brain: No evidence of acute infarction, hemorrhage, hydrocephalus, extra-axial collection or mass lesion/mass effect. Remote right superior temporal cortically based infarct. Brain atrophy with ventriculomegaly. Chronic small vessel ischemia in the cerebral white matter. Vascular: No hyperdense vessel. Skull: No acute or aggressive finding Sinuses/Orbits: Negative Other: These results were communicated to Xu at 10:07 amon 2/15/2021by text page via the [facility] messaging system. ASPECTS [facility] Stroke Program Early CT Score) Not scored without localizing symptoms. IMPRESSION: 1. No acute finding. 2. Atrophy, chronic small vessel ischemia, and small remote right superior temporal infarct. Electronically Signed   By: Marnee Spring M.D.   On: 06/29/2019 10:08   Disposition   Pt is being discharged home today in good condition.  Follow-up Plans & Appointments    Follow-up Information    Paraschos, Lyn Hollingshead, MD Follow up.   Specialty: Cardiology Why: Will need to arrange for outpatient follow up with the [facility] clinic at the time of DC from CIR. Contact information: [address] [facility] [place] [address] [phone]          Discharge Instructions    Diet - low sodium heart healthy   Complete by: As directed    Increase activity slowly   Complete by: As directed       Discharge Medications   Allergies as of 07/04/2019      Reactions   Metronidazole Diarrhea, Nausea And Vomiting   Oxycodone Other (See Comments)    Altered mental status      Medication List    TAKE these medications   atorvastatin 80 MG tablet Commonly known as: LIPITOR Take 1 tablet (80 mg total) by mouth daily at 6 PM.   clopidogrel 75 MG tablet Commonly known as: PLAVIX Take 1 tablet (75 mg total) by mouth daily. Start taking on: July 05, 2019   dabigatran 150 MG Caps capsule Commonly known as: PRADAXA Take 1 capsule (150 mg total) by mouth every 12 (  twelve) hours.   furosemide 20 MG tablet Commonly known as: LASIX Take 1 tablet (20 mg total) by mouth daily. Start taking on: July 05, 2019   levothyroxine 88 MCG tablet Commonly known as: SYNTHROID Take 88 mcg by mouth daily.   lidocaine 5 % Commonly known as: LIDODERM Place 1 patch onto the skin daily. Remove & Discard patch within 12 hours or as directed by MD Start taking on: July 05, 2019   losartan 25 MG tablet Commonly known as: COZAAR Take 0.5 tablets (12.5 mg total) by mouth daily. Start taking on: July 05, 2019   metoprolol succinate 50 MG 24 hr tablet Commonly known as: TOPROL-XL Take 3 tablets (150 mg total) by mouth daily. Start taking on: July 05, 2019         Outstanding Labs/Studies    None  Duration of Discharge Encounter   Greater than 30 minutes including physician time.  Signed, Laverda PageLindsay Roberts, NP 07/04/2019, 10:57 AM  Personally seen and examined. Agree with above.   Primary Cardiologist: Marcina MillardAlexander Paraschos, MD  Subjective  Resting comfortably in bed. Yesterday she did cough up a small clot of blood. This morning, this has cleared. Still feeling some soreness in her chest. Breathing comfortably.  Inpatient Medications  Scheduled Meds:  . atorvastatin 80 mg Oral q1800  . chlorhexidine 15 mL Mouth Rinse BID  . Chlorhexidine Gluconate Cloth 6 each Topical Daily  . clopidogrel 75 mg Oral Daily  . dabigatran 150 mg Oral Q12H  . furosemide 20 mg Oral Daily  . levothyroxine 88 mcg Oral Q0600  .  lidocaine 1 patch Transdermal Q24H  . losartan 12.5 mg Oral Daily  . mouth rinse 15 mL Mouth Rinse q12n4p  . metoprolol succinate 150 mg Oral Daily   Continuous Infusions:   PRN Meds:  acetaminophen (TYLENOL) oral liquid 160 mg/5 mL, traMADol  Vital Signs         Vitals:   07/03/19 1158 07/03/19 2015 07/04/19 0447 07/04/19 0457  BP: 105/65 126/76 109/60   Pulse: 85 79 (!) 107   Resp: 16 16 16    Temp: 97.6 F (36.4 C) 98.2 F (36.8 C) (!) 97.4 F (36.3 C)   TempSrc: Oral Oral Oral   SpO2: 94% 94% 95%   Weight:    68.5 kg  Height:        Intake/Output Summary (Last 24 hours) at 07/04/2019 1048  Last data filed at 07/04/2019 [id number]     Gross per 24 hour  Intake 900 ml  Output 275 ml  Net 625 ml   Last 3 Weights 07/04/2019 07/03/2019 07/02/2019  Weight (lbs) 151 lb 0.2 oz 149 lb 7.6 oz 147 lb 7.8 oz  Weight (kg) 68.5 kg 67.8 kg 66.9 kg   Telemetry  Atrial fibrillation heart rates have been in the 80s to 90s during rest- Personally Reviewed  ECG  No new- Personally Reviewed  Physical Exam  GEN: No acute distress. Comfortable in bed  Neck: No JVD  Cardiac: IRRR, no murmurs, rubs, or gallops.  Respiratory: Clear to auscultation bilaterally.  GI: Soft, nontender, non-distended  MS: No edema; No deformity.  Neuro: Nonfocal  Psych: Normal affect  Labs  High Sensitivity Troponin:  Last Labs       Recent Labs  Lab 06/28/19  1302 06/28/19  1522  TROPONINIHS 11,576* 13,408*  Chemistry  Last Labs          Recent Labs  Lab 06/29/19  1200 06/30/19  0215 07/01/19  0507  07/02/19  0021 07/03/19  0435  NA 142 < > 144 140 143  K 4.4 < > 3.9 3.1* 4.2  CL 109 < > 108 100 108  CO2 23 < > 22 27 27   GLUCOSE 115* < > 91 149* 116*  BUN 18 < > 18 21 20   CREATININE 1.24* < > 1.02* 1.15* 0.86  CALCIUM 8.4* < > 9.0 8.9 8.7*  PROT 5.4* --  --  --  --   ALBUMIN 2.9* --  --  --  --   AST 70* --  --  --  --   ALT 29 --  --  --  --   ALKPHOS 71 --  --  --  --   BILITOT 0.8 --   --  --  --   GFRNONAA 39* < > 50* 43* >60  GFRAA 46* < > 58* 50* >60  ANIONGAP 10 < > 14 13 8   < > = values in this interval not displayed.  Hematology  Last Labs        Recent Labs  Lab 07/01/19  0507 07/02/19  0021 07/03/19  0435  WBC 10.4 10.6* 9.0  RBC 3.96 3.79* 3.69*  HGB 12.0 11.5* 11.1*  HCT 38.4 36.1 35.3*  MCV 97.0 95.3 95.7  MCH 30.3 30.3 30.1  MCHC 31.3 31.9 31.4  RDW 14.2 14.0 14.4  PLT 274 344 359  BNP  Last Labs   No results for input(s): BNP, PROBNP in the last 168 hours.  DDimer  Last Labs   No results for input(s): DDIMER in the last 168 hours.   Radiology   Imaging Results (Last 48 hours)     Cardiac Studies  Cath, echo reviewed  Patient Profile  86 y.o. female cardiac arrest, new circumflex stent, non-STEMI, normal EF, stroke, persistent atrial fibrillation  Assessment & Plan  Cardiac arrest non-STEMI CAD  -95% stenosis mid to distal circumflex beyond previously placed stent with successful DES PCI of left circumflex with stent overlapping proximal stent resolute Onyx DES 2 mm x 15 mm - 2.3 mm  -Echo with normal EF  -Rib fractures, chest wall pain  -No aspirin  -On both Pradaxa as well as Plavix.  -Beta-blocker.  Persistent atrial fibrillation  -Pradaxa, metoprolol, rate in the 80s.  Stroke  -Neurology team following. Small left MCA. Anticoagulation.  Chronic diastolic heart failure  -Euvolemic, low-dose Lasix p.o.  Hypertension  -Stable on beta-blocker and angiotensin receptor blocker  Hyperlipidemia  -LDL 61. Continue with high intensity statin.  Hemoptysis  -Improved. She did cough up a small clot of blood yesterday.  Okay with DC to rehab  For questions or updates, please contact [facility]  Please consult [email or web address] for contact info under  Signed,  Donato Schultz, MD

## 2019-07-03 NOTE — H&P (Addendum)
Physical Medicine and Rehabilitation Admission H&P     HPI: Courtney Chan is an 86 year old right-handed female with history of atrial fibrillation followed by Dr.Paraschos at [facility] clinic , hyperlipidemia, hypertension, CKD with creatinine 1.05-1.18 CAD with PCI to RCA and circumflex 2010.  History taken from chart review, son, and patient due to cognitive slowing.  Patient lives with spouse.  Independent prior to admission.  1 level home.  Patient very active prior to admission.  She presented on 06/28/2019 with chest pain to [facility].  She was in the process of waiting in the waiting room when she became unresponsive and pulseless.  Patient appeared to be in ventricular tachycardia.  She did undergo defibrillation x1 with additional 3 to 4 minutes of CPR.  She was found to have NSTEMI with PCI completed per cardiology services.  On 06/29/2019 after extubation patient had altered mental status and was not following commands.  MRI brain ordered, personally reviewed, small acute infarct.  Per report,small acute infarct involving the posterior aspect of the left superior frontal gyrus.  CT angiogram of head and neck with no emergent vascular findings.  Echocardiogram with ejection fraction of 60% without emboli.  EEG negative for seizures.  Neurology follow-up maintained on Plavix as well as Pradaxa.  Tolerating a regular diet.  Therapy evaluations completed with recommendations of physical medicine rehab consult.  Patient was admitted for a comprehensive rehab program.  Please see preadmission assessment from earlier today as well.  Review of Systems  Constitutional: Positive for malaise/fatigue. Negative for chills and fever.  HENT: Negative for hearing loss.   Eyes: Negative for blurred vision and double vision.  Respiratory: Negative for cough and shortness of breath.   Gastrointestinal: Positive for constipation. Negative for heartburn, nausea and vomiting.  Genitourinary: Negative for  dysuria, flank pain and hematuria.  Musculoskeletal: Positive for joint pain and myalgias.  Skin: Negative for rash.  Neurological: Positive for weakness. Negative for sensory change, speech change and focal weakness.   Past Medical History:  Diagnosis Date  . Atrial fibrillation ([place]) 2011  . Hyperlipidemia   . Hypertension   . Stroke ([place])   . Thyroid disease    Past Surgical History:  Procedure Laterality Date  . ABDOMINAL HYSTERECTOMY    . BREAST BIOPSY Left 1980   Negative  . CORONARY/GRAFT ACUTE MI REVASCULARIZATION N/A 06/28/2019   Procedure: Coronary/Graft Acute MI Revascularization;  Surgeon: Leonie Man, MD;  Location: [place] [facility];  Service: Cardiovascular;  Laterality: N/A;  . LEFT HEART CATH AND CORONARY ANGIOGRAPHY N/A 06/28/2019   Procedure: LEFT HEART CATH AND CORONARY ANGIOGRAPHY;  Surgeon: Leonie Man, MD;  Location: [place] [facility];  Service: Cardiovascular;  Laterality: N/A;   Family History  Problem Relation Age of Onset  . Breast cancer Maternal Grandmother        73's   Social History:  reports that she has never smoked. She has never used smokeless tobacco. She reports that she does not drink alcohol or use drugs. Allergies:  Allergies  Allergen Reactions  . Metronidazole Diarrhea and Nausea And Vomiting  . Oxycodone Other (See Comments)    Altered mental status   Medications Prior to Admission  Medication Sig Dispense Refill  . atorvastatin (LIPITOR) 80 MG tablet Take 1 tablet (80 mg total) by mouth daily at 6 PM.    . [START ON 07/05/2019] clopidogrel (PLAVIX) 75 MG tablet Take 1 tablet (75 mg total) by mouth daily.    Marland Kitchen  dabigatran (PRADAXA) 150 MG CAPS capsule Take 1 capsule (150 mg total) by mouth every 12 (twelve) hours. 60 capsule   . [START ON 07/05/2019] furosemide (LASIX) 20 MG tablet Take 1 tablet (20 mg total) by mouth daily. 30 tablet   . levothyroxine (SYNTHROID, LEVOTHROID) 88 MCG tablet Take 88 mcg by mouth daily.       Melene Muller ON 07/05/2019] lidocaine (LIDODERM) 5 % Place 1 patch onto the skin daily. Remove & Discard patch within 12 hours or as directed by MD 30 patch 0  . [START ON 07/05/2019] losartan (COZAAR) 25 MG tablet Take 0.5 tablets (12.5 mg total) by mouth daily.    Melene Muller ON 07/05/2019] metoprolol succinate (TOPROL-XL) 50 MG 24 hr tablet Take 3 tablets (150 mg total) by mouth daily.      Drug Regimen Review Drug regimen was reviewed and remains appropriate with no significant issues identified  Home: Home Living Family/patient expects to be discharged to:: Private residence Living Arrangements: Spouse/significant other Available Help at Discharge: Family, Available 24 hours/day Type of Home: House Home Access: Level entry Home Layout: One level Bathroom Shower/Tub: Engineer, manufacturing systems: Standard Bathroom Accessibility: Yes Home Equipment: Environmental consultant - 2 wheels, Cane - single point  Lives With: Spouse, Family   Functional History: Prior Function Level of Independence: Independent Comments: pt has been very active and walks ~ 2 miles every morning with no device. she enjoys gardening and her and her husband both take care of the yard and cooking/cleaning. she is independent for all ADL's.  Functional Status:  Mobility: Bed Mobility Overal bed mobility: Needs Assistance Bed Mobility: Rolling, Sidelying to Sit Rolling: Min assist Sidelying to sit: Mod assist Supine to sit: HOB elevated, Min guard General bed mobility comments: up in recliner Transfers Overall transfer level: Needs assistance Equipment used: Rolling walker (2 wheeled) Transfers: Sit to/from Stand Sit to Stand: Min guard, Min assist Stand pivot transfers: Mod assist General transfer comment: assist to rise and steady, up to min A for balance Ambulation/Gait Ambulation/Gait assistance: Mod assist, Min assist Gait Distance (Feet): (~60 ft total in room) Assistive device: 1 person hand held assist Gait  Pattern/deviations: Decreased stride length, Step-through pattern, Drifts right/left General Gait Details: assist for balance; increased need for assistance with challenges Gait velocity: decreased    ADL: ADL Overall ADL's : Needs assistance/impaired Eating/Feeding: Set up, Sitting Grooming: Min guard, Standing Upper Body Bathing: Minimal assistance, Sitting Lower Body Bathing: Minimal assistance, Moderate assistance, Sit to/from stand, Sitting/lateral leans Upper Body Dressing : Minimal assistance, Sitting Lower Body Dressing: Minimal assistance, Moderate assistance, Sit to/from stand Lower Body Dressing Details (indicate cue type and reason): limited from chest pain Toilet Transfer: Min guard, Regular Toilet, Grab bars, RW Toileting- Clothing Manipulation and Hygiene: Minimal assistance, Sit to/from stand Tub/ Engineer, structural: Minimal assistance, Ambulation, Shower seat, Rolling walker Functional mobility during ADLs: Min guard, Rolling walker, Cueing for safety General ADL Comments: pt needing safety cues to complete BADL. Also presents generally weak and fatigues easily from baseline  Cognition: Cognition Overall Cognitive Status: Impaired/Different from baseline Orientation Level: Oriented X4 Cognition Arousal/Alertness: Awake/alert Behavior During Therapy: WFL for tasks assessed/performed, Flat affect Overall Cognitive Status: Impaired/Different from baseline Area of Impairment: Memory, Safety/judgement, Problem solving Orientation Level: (slight impairments but pt able to correct them with cues) Memory: Decreased short-term memory Following Commands: Follows one step commands consistently Safety/Judgement: Decreased awareness of deficits Problem Solving: Difficulty sequencing, Requires verbal cues General Comments: pt presents with STM deficits and  son present and also endorses this stating pt has been asking similar questions twice. Overall has poor recall to current  deficits  Physical Exam: Blood pressure 114/65, pulse (!) 101, temperature 98.2 F (36.8 C), temperature source Oral, resp. rate 16, height 5\' 4"  (1.626 m), weight 68.5 kg, SpO2 96 %. Physical Exam  Vitals reviewed. Constitutional: She appears well-developed and well-nourished.  HENT:  Head: Normocephalic and atraumatic.  Eyes: EOM are normal. Right eye exhibits no discharge. Left eye exhibits no discharge.  Neck: No tracheal deviation present. No thyromegaly present.  Respiratory: Effort normal. No respiratory distress.  + [place]  GI: Soft. She exhibits no distension.  Musculoskeletal:     Comments: No edema or tenderness in extremities  Neurological: She is alert.  Patient is alert in no acute distress Makes good eye contact with examiner.   Oriented x3 and follows full commands.   Fair awareness of deficits. Some confusion noted. Motor: 4 --4/5 throughout Sensation intact light touch  Skin: Skin is warm and dry.  Psychiatric: Her affect is blunt. Her speech is delayed.    Results for orders placed or performed during the hospital encounter of 06/28/19 (from the past 48 hour(s))  CBC     Status: Abnormal   Collection Time: 07/03/19  4:35 AM  Result Value Ref Range   WBC 9.0 4.0 - 10.5 K/uL   RBC 3.69 (L) 3.87 - 5.11 MIL/uL   Hemoglobin 11.1 (L) 12.0 - 15.0 g/dL   HCT 07/05/19 (L) 94.1 - 74.0 %   MCV 95.7 80.0 - 100.0 fL   MCH 30.1 26.0 - 34.0 pg   MCHC 31.4 30.0 - 36.0 g/dL   RDW 81.4 48.1 - 85.6 %   Platelets 359 150 - 400 K/uL   nRBC 0.0 0.0 - 0.2 %    Comment: Performed at [facility], [address], [place], [place]  Basic metabolic panel     Status: Abnormal   Collection Time: 07/03/19  4:35 AM  Result Value Ref Range   Sodium 143 135 - 145 mmol/L   Potassium 4.2 3.5 - 5.1 mmol/L   Chloride 108 98 - 111 mmol/L   CO2 27 22 - 32 mmol/L   Glucose, Bld 116 (H) 70 - 99 mg/dL   BUN 20 8 - 23 mg/dL   Creatinine, Ser 07/05/19 0.44 - 1.00 mg/dL   Calcium 8.7  (L) 8.9 - 10.3 mg/dL   GFR calc non Af Amer >60 >60 mL/min   GFR calc Af Amer >60 >60 mL/min   Anion gap 8 5 - 15    Comment: Performed at [facility], [address] [place], [place]   No results found.     Medical Problem List and Plan: 1.  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 fib  -patient may shower  -ELOS/Goals: 8-12 days/supervision.  Admit to CIR 2.  Antithrombotics: -DVT/anticoagulation: Pradaxa 150 mg every 12 hours  -antiplatelet therapy: Plavix 75 mg daily 3. Pain Management: Lidoderm patch, Ultram as needed 4. Mood: Provide emotional support  -antipsychotic agents: N/A 5. Neuropsych: This patient is?  Fully capable of making decisions on her own behalf. 6. Skin/Wound Care: Routine skin checks 7. Fluids/Electrolytes/Nutrition: Routine in and outs.  CMP ordered. 8.  Atrial fibrillation.  Continue Pradaxa.  Cardiac rate controlled.  Continue Toprol 100 mg daily  Monitor with increased exertion 9.  Diastolic congestive heart failure.  Lasix 20 mg daily as well as Cozaar 25 mg daily.  Monitor for any signs of fluid overload  Daily weights 10.  Hypothyroidism.  Synthroid 11.  Hyperlipidemia Lipitor 12.  CKD.  Creatinine baseline 1.05-1.18.    CMP ordered  Charlton Amor, PA-C 07/04/2019  I have personally performed a face to face diagnostic evaluation, including, but not limited to relevant history and physical exam findings, of this patient and developed relevant assessment and plan.  Additionally, I have reviewed and concur with the physician assistant's documentation above.  Maryla Morrow, MD, ABPMR

## 2019-07-03 NOTE — Progress Notes (Signed)
Patient is ready for inpatient rehab. Pending insurance approval. Increase toprol XL to 150mg  qd.

## 2019-07-03 NOTE — Evaluation (Signed)
Occupational Therapy Evaluation Patient Details Name: Courtney Chan MRN: [id number] DOB: [birth year] Today's Date: 07/03/2019    History of Present Illness CHENIKA Chan is a 86 y.o. female with a hx of CAD with PCI to the RCA and Circumflex in 2010 along with chronic persistent A. fib, prior CVA, HTN, HLD, who presented to [facility] on 06/28/19 with cardiac arrest-V fib and elevated troponin. Pt was revived in ED after undergoing CPR and defibrilation. She was found to be in A. fib RVR. Pt now s/p cardiac cath on 06/28/19. Pt now found to have small acute infarct of posterior aspect of the leftsuperior frontal gyrus.   Clinical Impression   PTA pt very independent, mowing lawn, working in yard and walking few miles a day. At time of eval, pt needing min guard- min A for sit <> stand to steady self considering poor balance. Pt able to complete functional mobility beyond household distance with safety cues and pillow close by to splint cough. Pt limited in UB/LB self care due to chest pain. Pt showing some higher level cognitive deficits as described below. Son present and supportive throughout session. Recommend CIR level therapies at d/c to support PLOF of high independence. Will continue to follow while acute.    Follow Up Recommendations  CIR    Equipment Recommendations  None recommended by OT;3 in 1 bedside commode    Recommendations for Other Services       Precautions / Restrictions Precautions Precautions: Fall Precaution Comments: chest pain from compressions Restrictions Weight Bearing Restrictions: No      Mobility Bed Mobility               General bed mobility comments: up in recliner  Transfers Overall transfer level: Needs assistance Equipment used: Rolling walker (2 wheeled) Transfers: Sit to/from Stand Sit to Stand: Min guard;Min assist         General transfer comment: assist to rise and steady, up to min A for balance    Balance Overall balance  assessment: Needs assistance Sitting-balance support: Feet supported Sitting balance-Leahy Scale: Fair     Standing balance support: During functional activity;Single extremity supported Standing balance-Leahy Scale: Poor Standing balance comment: requires external support                           ADL either performed or assessed with clinical judgement   ADL Overall ADL's : Needs assistance/impaired Eating/Feeding: Set up;Sitting   Grooming: Min guard;Standing   Upper Body Bathing: Minimal assistance;Sitting   Lower Body Bathing: Minimal assistance;Moderate assistance;Sit to/from stand;Sitting/lateral leans   Upper Body Dressing : Minimal assistance;Sitting   Lower Body Dressing: Minimal assistance;Moderate assistance;Sit to/from stand Lower Body Dressing Details (indicate cue type and reason): limited from chest pain Toilet Transfer: Min guard;Regular Toilet;Grab bars;RW   Toileting- Clothing Manipulation and Hygiene: Minimal assistance;Sit to/from stand   Tub/ Shower Transfer: Minimal assistance;Ambulation;Shower seat;Rolling walker   Functional mobility during ADLs: Min guard;Rolling walker;Cueing for safety General ADL Comments: pt needing safety cues to complete BADL. Also presents generally weak and fatigues easily from baseline     Vision Baseline Vision/History: Wears glasses Wears Glasses: At all times Patient Visual Report: No change from baseline Vision Assessment?: No apparent visual deficits     Perception     Praxis      Pertinent Vitals/Pain Pain Assessment: Faces Faces Pain Scale: Hurts even more Pain Location: chest Pain Descriptors / Indicators: Sore;Grimacing;Guarding Pain Intervention(s): Limited activity  within patient's tolerance;Monitored during session;Heat applied;Other (comment)(edu on splinting with pillow)     Hand Dominance     Extremity/Trunk Assessment Upper Extremity Assessment Upper Extremity Assessment:  Generalized weakness   Lower Extremity Assessment Lower Extremity Assessment: Defer to PT evaluation       Communication Communication Communication: No difficulties   Cognition Arousal/Alertness: Awake/alert Behavior During Therapy: WFL for tasks assessed/performed;Flat affect Overall Cognitive Status: Impaired/Different from baseline Area of Impairment: Memory;Safety/judgement;Problem solving                     Memory: Decreased short-term memory   Safety/Judgement: Decreased awareness of deficits   Problem Solving: Difficulty sequencing;Requires verbal cues General Comments: pt presents with STM deficits and son present and also endorses this stating pt has been asking similar questions twice. Overall has poor recall to current deficits   General Comments       Exercises     Shoulder Instructions      Home Living Family/patient expects to be discharged to:: Private residence Living Arrangements: Spouse/significant other Available Help at Discharge: Family;Available 24 hours/day Type of Home: House Home Access: Level entry     Home Layout: One level     Bathroom Shower/Tub: Chief Strategy Officer: Standard Bathroom Accessibility: Yes   Home Equipment: Environmental consultant - 2 wheels;Cane - single point          Prior Functioning/Environment Level of Independence: Independent        Comments: pt has been very active and walks ~ 2 miles every morning with no device. she enjoys gardening and her and her husband both take care of the yard and cooking/cleaning. she is independent for all ADL's.        OT Problem List: Decreased strength;Decreased knowledge of use of DME or AE;Decreased knowledge of precautions;Decreased activity tolerance;Impaired balance (sitting and/or standing);Pain      OT Treatment/Interventions: Self-care/ADL training;Therapeutic exercise;Patient/family education;Balance training;Energy conservation;Neuromuscular  education;Therapeutic activities;DME and/or AE instruction;Cognitive remediation/compensation    OT Goals(Current goals can be found in the care plan section) Acute Rehab OT Goals Patient Stated Goal: to get back to walking every day and get the garden planted OT Goal Formulation: With patient Time For Goal Achievement: 07/17/19 Potential to Achieve Goals: Good  OT Frequency: Min 2X/week   Barriers to D/C:            Co-evaluation              AM-PAC OT "6 Clicks" Daily Activity     Outcome Measure Help from another person eating meals?: None Help from another person taking care of personal grooming?: None Help from another person toileting, which includes using toliet, bedpan, or urinal?: A Little Help from another person bathing (including washing, rinsing, drying)?: A Little Help from another person to put on and taking off regular upper body clothing?: A Little Help from another person to put on and taking off regular lower body clothing?: A Little 6 Click Score: 20   End of Session Equipment Utilized During Treatment: Rolling walker Nurse Communication: Mobility status  Activity Tolerance: Patient tolerated treatment well Patient left: in chair;with call bell/phone within reach;with family/visitor present  OT Visit Diagnosis: Unsteadiness on feet (R26.81);Other abnormalities of gait and mobility (R26.89);Muscle weakness (generalized) (M62.81);Pain Pain - part of body: (chest)                Time: 6283-1517 OT Time Calculation (min): 26 min Charges:  OT General Charges $OT Visit: 1  Visit OT Evaluation $OT Eval Moderate Complexity: 1 Mod OT Treatments $Self Care/Home Management : 8-22 mins  Zenovia Jarred, MSOT, OTR/L Acute Rehabilitation Services [facility] Office Number: [phone]  Zenovia Jarred 07/03/2019, 5:29 PM

## 2019-07-03 NOTE — Progress Notes (Signed)
Physical Therapy Treatment Patient Details Name: Courtney Chan MRN: [id number] DOB: 1930-06-03 Today's Date: 07/03/2019    History of Present Illness Courtney Chan is a 86 y.o. female with a hx of CAD with PCI to the RCA and Circumflex in 2010 along with chronic persistent A. fib, prior CVA, HTN, HLD, who presented to [facility] on 06/28/19 with cardiac arrest-V fib and elevated troponin. Pt was revived in ED after undergoing CPR and defibrilation. She was found to be in A. fib RVR. Pt now s/p cardiac cath on 06/28/19. Pt now found to have small acute infarct of posterior aspect of the leftsuperior frontal gyrus.    PT Comments    Patient seen for mobility progression. Pt continues to be a good candidate for CIR level therapies. PT will continue to follow and progress as tolerated.    Follow Up Recommendations  Supervision/Assistance - 24 hour;CIR     Equipment Recommendations  None recommended by PT    Recommendations for Other Services       Precautions / Restrictions Precautions Precautions: Fall Restrictions Weight Bearing Restrictions: No    Mobility  Bed Mobility Overal bed mobility: Needs Assistance Bed Mobility: Rolling;Sidelying to Sit Rolling: Min assist Sidelying to sit: Mod assist       General bed mobility comments: pt hugging pillow for comfort; assistance to elevate trunk into sitting and scoot hips to EOB due to painful chest  Transfers Overall transfer level: Needs assistance Equipment used: 1 person hand held assist Transfers: Sit to/from Stand Sit to Stand: Min assist         General transfer comment: assistance for balance  Ambulation/Gait Ambulation/Gait assistance: Mod assist;Min assist Gait Distance (Feet): (~60 ft total in room) Assistive device: 1 person hand held assist Gait Pattern/deviations: Decreased stride length;Step-through pattern;Drifts right/left Gait velocity: decreased   General Gait Details: assist for balance; increased  need for assistance with challenges   Stairs             Wheelchair Mobility    Modified Rankin (Stroke Patients Only)       Balance Overall balance assessment: Needs assistance Sitting-balance support: Feet supported Sitting balance-Leahy Scale: Fair     Standing balance support: During functional activity;Single extremity supported Standing balance-Leahy Scale: Poor Standing balance comment: pt unable to maintain balance with rhomberg (eyes closed) and bilat tandem stance; did not attempt single leg stance             High level balance activites: Side stepping;Backward walking;Head turns High Level Balance Comments: pt requires assistance to maintain balance with horizontal head turns being most difficult and pt with lateral LOB            Cognition Arousal/Alertness: Awake/alert Behavior During Therapy: WFL for tasks assessed/performed;Flat affect Overall Cognitive Status: Within Functional Limits for tasks assessed                                 General Comments: WFL for simple mobility tasks      Exercises      General Comments        Pertinent Vitals/Pain Pain Assessment: Faces Pain Score: 10-Worst pain ever Faces Pain Scale: Hurts even more Pain Location: chest Pain Descriptors / Indicators: Sore;Grimacing;Guarding Pain Intervention(s): Limited activity within patient's tolerance;Monitored during session;Premedicated before session;Repositioned;Heat applied    Home Living  Prior Function            PT Goals (current goals can now be found in the care plan section) Progress towards PT goals: Progressing toward goals    Frequency    Min 4X/week      PT Plan Current plan remains appropriate    Co-evaluation              AM-PAC PT "6 Clicks" Mobility   Outcome Measure  Help needed turning from your back to your side while in a flat bed without using bedrails?: A Little Help  needed moving from lying on your back to sitting on the side of a flat bed without using bedrails?: A Lot Help needed moving to and from a bed to a chair (including a wheelchair)?: A Little Help needed standing up from a chair using your arms (e.g., wheelchair or bedside chair)?: A Little Help needed to walk in hospital room?: A Little Help needed climbing 3-5 steps with a railing? : A Lot 6 Click Score: 16    End of Session Equipment Utilized During Treatment: Gait belt Activity Tolerance: Patient tolerated treatment well Patient left: with call bell/phone within reach;in chair;with chair alarm set Nurse Communication: Mobility status PT Visit Diagnosis: Muscle weakness (generalized) (M62.81);Difficulty in walking, not elsewhere classified (R26.2);Unsteadiness on feet (R26.81)     Time: 5631-4970 PT Time Calculation (min) (ACUTE ONLY): 22 min  Charges:  $Neuromuscular Re-education: 8-22 mins                     Earney Navy, PTA Acute Rehabilitation Services Pager: [phone] Office: [phone]     Darliss Cheney 07/03/2019, 11:26 AM

## 2019-07-03 NOTE — Progress Notes (Addendum)
Progress Note  Patient Name: Courtney Chan Date of Encounter: 07/03/2019  Primary Cardiologist: Paraschos ([place]   Had some hemoptysis this morning.  Continues to have chest wall soreness.  Inpatient Medications    Scheduled Meds: . atorvastatin  80 mg Oral q1800  . chlorhexidine  15 mL Mouth Rinse BID  . Chlorhexidine Gluconate Cloth  6 each Topical Daily  . clopidogrel  75 mg Oral Daily  . dabigatran  150 mg Oral Q12H  . furosemide  20 mg Oral Daily  . levothyroxine  88 mcg Oral Q0600  . lidocaine  1 patch Transdermal Q24H  . [START ON 07/04/2019] losartan  12.5 mg Oral Daily  . mouth rinse  15 mL Mouth Rinse q12n4p  . [START ON 07/04/2019] metoprolol succinate  150 mg Oral Daily   Continuous Infusions:  PRN Meds: acetaminophen (TYLENOL) oral liquid 160 mg/5 mL, traMADol   Vital Signs    Vitals:   07/02/19 1632 07/02/19 2303 07/03/19 0439 07/03/19 0827  BP: 112/68 121/69 124/71 114/81  Pulse: 89 91 79 (!) 103  Resp: 18 18 20    Temp: (!) 97.4 F (36.3 C) 97.6 F (36.4 C) (!) 97.5 F (36.4 C)   TempSrc: Oral Oral Oral   SpO2: 98% 97% 95% 96%  Weight:   67.8 kg   Height:        Intake/Output Summary (Last 24 hours) at 07/03/2019 0956 Last data filed at 07/02/2019 2200 Gross per 24 hour  Intake 480 ml  Output --  Net 480 ml   Last 3 Weights 07/03/2019 07/02/2019 07/01/2019  Weight (lbs) 149 lb 7.6 oz 147 lb 7.8 oz 152 lb 1.9 oz  Weight (kg) 67.8 kg 66.9 kg 69 kg      Telemetry    Atrial fibrillation at rate of 80-100- Personally Reviewed  ECG  No new tracing  Physical Exam   GEN: No acute distress.   Neck: No JVD Cardiac: RRR, no murmurs, rubs, or gallops.  Respiratory: Clear to auscultation bilaterally. GI: Soft, nontender, non-distended  MS: No edema; No deformity. Neuro:  Nonfocal  Psych: Normal affect   Labs    High Sensitivity Troponin:   Recent Labs  Lab 06/28/19 1302 06/28/19 1522  TROPONINIHS 11,576*  13,408*      Chemistry Recent Labs  Lab 06/29/19 1200 06/30/19 0215 07/01/19 0507 07/02/19 0021 07/03/19 0435  NA 142   < > 144 140 143  K 4.4   < > 3.9 3.1* 4.2  CL 109   < > 108 100 108  CO2 23   < > 22 27 27   GLUCOSE 115*   < > 91 149* 116*  BUN 18   < > 18 21 20   CREATININE 1.24*   < > 1.02* 1.15* 0.86  CALCIUM 8.4*   < > 9.0 8.9 8.7*  PROT 5.4*  --   --   --   --   ALBUMIN 2.9*  --   --   --   --   AST 70*  --   --   --   --   ALT 29  --   --   --   --   ALKPHOS 71  --   --   --   --   BILITOT 0.8  --   --   --   --   GFRNONAA 39*   < > 50* 43* >60  GFRAA 46*   < > 58* 50* >  60  ANIONGAP 10   < > 14 13 8    < > = values in this interval not displayed.     Hematology Recent Labs  Lab 07/01/19 0507 07/02/19 0021 07/03/19 0435  WBC 10.4 10.6* 9.0  RBC 3.96 3.79* 3.69*  HGB 12.0 11.5* 11.1*  HCT 38.4 36.1 35.3*  MCV 97.0 95.3 95.7  MCH 30.3 30.3 30.1  MCHC 31.3 31.9 31.4  RDW 14.2 14.0 14.4  PLT 274 344 359    Radiology    No results found.  Cardiac Studies   Coronary/Graft Acute MI Revascularization  LEFT HEART CATH AND CORONARY ANGIOGRAPHY  Conclusion    Previously placed Prox Cx to Mid Cx stent (unknown type) is widely patent -the lesion began just distal to the stent  CULPRIT LESION: Mid Cx lesion is 95% stenosed.  A drug-eluting stent was successfully placed in order to overlap the previously placed stent, using a STENT RESOLUTE ONYX 2.0X15. Postdilated to 2.3 mm - 2.1 mm  Post intervention, there is a 0% residual stenosis.  -----------------------------------  Ost RCA to Prox RCA lesion is 30% stenosed. Previously placed Prox RCA to Mid RCA stent (unknown type) is widely patent.  Mid LAD lesion is 30% stenosed.  -----------------------------------  There is moderate to severe left ventricular systolic dysfunction. The Left Ventricular Ejection Fraction is 25-35% by visual estimate.  LV end diastolic pressure is moderately elevated.  There is mild (2+) mitral regurgitation.   SUMMARY  Severe single-vessel CAD with 95% stenosis in mid-distal LCx beyond previously placed stent ? Successful DES PCI of LCx with stent overlapping proximal stent (resolute Onyx DES 2.0 mm x 15 mm--2.3 mm)  Otherwise mild to moderate 30% ostial and proximal RCA and mid LAD stenosis.  ACUTE COMBINED SYSTOLIC AND DIASTOLIC HEART FAILURE: Severely reduced LVEF with Takotsubo pattern, EF estimated 25 to 30%; Moderately elevated LVEDP of 20 mmHg  Persistent atrial fibrillation, rates notably improved following PCI  Borderline hypotension with improved blood pressures following PCI-no evidence of Cardiogenic Shock  Resolution of Right Bundle Branch Block  Evidence of neurologic recovery with the patient opening eyes and following commands while intubated. ->  Follows finger with eyes, squeezes hands.   RECOMMENDATIONS  Based on bed availability, the patient will be transferred to [facility] CVICU (2 Heart).  With A. fib, will start IV heparin 8 hours after sheath removal and convert back to Pradaxa in the morning  Have ordered 2D echocardiogram, suspect reduced EF is partly related to stress cardiomyopathy from cardiac arrest as the reduced EF is well at proportion with her coronary disease.  Unclear if the patient has permanent A. fib versus paroxysmal, did not convert with defibrillation -> was on digoxin and high-dose metoprolol prior to arrival, neither have been ordered until the patient's blood pressure stabilized, would probably start with low-dose metoprolol in the morning.  Have not ordered other antihypertensives due to borderline blood pressures.   The patient will be cared for under the [facility] HEART CARE service and upon discharge will be returned to the care of Dr. [facility].   1. Left ventricular ejection fraction, by estimation, is 55 to 60%. The  left ventricle has normal function. The left ventricle has no  regional  wall motion abnormalities. Left ventricular diastolic function could not  be evaluated.  2. Right ventricular systolic function is normal. The right ventricular  size is normal. There is mildly elevated pulmonary artery systolic  pressure.  3. The mitral valve is  degenerative. Mild mitral valve regurgitation. No  evidence of mitral stenosis.  4. Tricuspid valve regurgitation is mild to moderate.  5. The aortic valve is tricuspid. Aortic valve regurgitation is not  visualized. Mild to moderate aortic valve sclerosis/calcification is  present, without any evidence of aortic stenosis.   Patient Profile     86 y.o. female with history of HTN, HLD, CKD, persistent atrial fib on Pradaxa, prior CVA, hypothyroidism admitted to [facility] 06/28/19 with a NSTEMI followed by cardiac arrest in the ED. She was resuscitated and emergent cath showed severe Circumflex stenosis. This was treated with a drug eluting stent. Post extubation, mental status change felt to be due to stroke that was seen on brain MRI on 06/30/19.   Assessment & Plan    1. NSTEMI/CAD - Emergent cath showed severe single-vessel CAD with 95% stenosis in mid-distal LCx beyond previously placed stent s/p successful DES PCI of LCx with stent overlapping proximal stent (resolute Onyx DES 2.0 mm x 15 mm--2.3 mm) - Echo with preserved LVEF - continue to have chest wall pain >improves with heat pad - Off ASA since on anticoagulation -Continue Plavix statin and beta-blocker  2.  Persistent atrial fibrillation -Metoprolol changed to Toprol this admission (may need up titration) -Rate in 80-100 -On Pradaxa for anticoagulation  3. CVA - Small left MCA frontal gyrus infarct felt to be embolic by Neurology team. Recommendation to continue anti-coagulation. Continue Pradaxa.   4.  Chronic diastolic heart failure -Appears euvolemic.  Net I&O positive.  Lasix p.o. 20 mg.  5.  Hypertension -Blood pressure stable on beta-blocker and  ARB  6.  Hyperlipidemia - 07/01/2019: Cholesterol 122; HDL 35; LDL Cholesterol 61; Triglycerides 128; VLDL 26  -Continue high intensity statin  7.  Hemoptysis -First time this morning.  No fever, chills.  Lungs clear.  Afebrile.  For questions or updates, please contact [facility] Please consult [email or web address] for contact info under        Signed, Verne Carrow, MD  07/03/2019, 9:56 AM    I have personally seen and examined this patient. I agree with the assessment and plan as outlined above.  She is progressing well post cardiac arrest, NSTEMI. Only with chest wall pain this am likely due to rib fractures from CPR. BP is stable. Heart rate is controlled. She is back on Pradaxa for persistent atrial fib. She will be treated with Plavix and Pradaxa post MI/stenting. No ASA given high bleeding risk. We have placed a consult for inpatient rehab. She is ready for inpatient rehab when a bed is available.   Verne Carrow 07/03/2019 9:56 AM

## 2019-07-04 ENCOUNTER — Inpatient Hospital Stay (HOSPITAL_COMMUNITY)
Admission: RE | Admit: 2019-07-04 | Discharge: 2019-07-12 | DRG: 056 | Disposition: A | Discharge status: Home / Self Care | Payer: Medicare PPO | Source: Intra-hospital | Attending: Physical Medicine & Rehabilitation | Admitting: Physical Medicine & Rehabilitation

## 2019-07-04 ENCOUNTER — Encounter (HOSPITAL_COMMUNITY): Payer: Self-pay | Admitting: Physical Medicine & Rehabilitation

## 2019-07-04 ENCOUNTER — Other Ambulatory Visit: Payer: Self-pay

## 2019-07-04 DIAGNOSIS — R042 Hemoptysis: Secondary | ICD-10-CM | POA: Diagnosis present

## 2019-07-04 DIAGNOSIS — Z79899 Other long term (current) drug therapy: Secondary | ICD-10-CM

## 2019-07-04 DIAGNOSIS — N183 Chronic kidney disease, stage 3 unspecified: Secondary | ICD-10-CM

## 2019-07-04 DIAGNOSIS — I130 Hypertensive heart and chronic kidney disease with heart failure and stage 1 through stage 4 chronic kidney disease, or unspecified chronic kidney disease: Secondary | ICD-10-CM | POA: Diagnosis present

## 2019-07-04 DIAGNOSIS — I5030 Unspecified diastolic (congestive) heart failure: Secondary | ICD-10-CM | POA: Diagnosis present

## 2019-07-04 DIAGNOSIS — K5900 Constipation, unspecified: Secondary | ICD-10-CM | POA: Diagnosis present

## 2019-07-04 DIAGNOSIS — Z7901 Long term (current) use of anticoagulants: Secondary | ICD-10-CM | POA: Diagnosis not present

## 2019-07-04 DIAGNOSIS — E785 Hyperlipidemia, unspecified: Secondary | ICD-10-CM | POA: Diagnosis present

## 2019-07-04 DIAGNOSIS — Z8674 Personal history of sudden cardiac arrest: Secondary | ICD-10-CM

## 2019-07-04 DIAGNOSIS — Z79890 Hormone replacement therapy (postmenopausal): Secondary | ICD-10-CM | POA: Diagnosis not present

## 2019-07-04 DIAGNOSIS — E039 Hypothyroidism, unspecified: Secondary | ICD-10-CM | POA: Diagnosis present

## 2019-07-04 DIAGNOSIS — I63512 Cerebral infarction due to unspecified occlusion or stenosis of left middle cerebral artery: Secondary | ICD-10-CM | POA: Diagnosis not present

## 2019-07-04 DIAGNOSIS — I4891 Unspecified atrial fibrillation: Secondary | ICD-10-CM | POA: Diagnosis present

## 2019-07-04 DIAGNOSIS — R5381 Other malaise: Secondary | ICD-10-CM

## 2019-07-04 DIAGNOSIS — I214 Non-ST elevation (NSTEMI) myocardial infarction: Secondary | ICD-10-CM | POA: Diagnosis present

## 2019-07-04 DIAGNOSIS — N189 Chronic kidney disease, unspecified: Secondary | ICD-10-CM | POA: Diagnosis present

## 2019-07-04 DIAGNOSIS — Z955 Presence of coronary angioplasty implant and graft: Secondary | ICD-10-CM

## 2019-07-04 DIAGNOSIS — Z9981 Dependence on supplemental oxygen: Secondary | ICD-10-CM | POA: Diagnosis not present

## 2019-07-04 DIAGNOSIS — I69398 Other sequelae of cerebral infarction: Principal | ICD-10-CM

## 2019-07-04 DIAGNOSIS — Z7902 Long term (current) use of antithrombotics/antiplatelets: Secondary | ICD-10-CM | POA: Diagnosis not present

## 2019-07-04 DIAGNOSIS — Z90710 Acquired absence of both cervix and uterus: Secondary | ICD-10-CM | POA: Diagnosis not present

## 2019-07-04 DIAGNOSIS — I5031 Acute diastolic (congestive) heart failure: Secondary | ICD-10-CM

## 2019-07-04 DIAGNOSIS — I2510 Atherosclerotic heart disease of native coronary artery without angina pectoris: Secondary | ICD-10-CM | POA: Diagnosis present

## 2019-07-04 DIAGNOSIS — I639 Cerebral infarction, unspecified: Secondary | ICD-10-CM

## 2019-07-04 DIAGNOSIS — I509 Heart failure, unspecified: Secondary | ICD-10-CM

## 2019-07-04 DIAGNOSIS — I10 Essential (primary) hypertension: Secondary | ICD-10-CM

## 2019-07-04 MED ORDER — LIDOCAINE 5 % EX PTCH: 1.0000 | MEDICATED_PATCH | CUTANEOUS | 0 refills | Status: DC

## 2019-07-04 MED ORDER — LIDOCAINE 5 % EX PTCH
1.0000 | MEDICATED_PATCH | CUTANEOUS | Status: DC
  Administered 2019-07-05 – 2019-07-12 (×8): 1 via TRANSDERMAL
  Filled 2019-07-04 (×8): qty 1

## 2019-07-04 MED ORDER — DABIGATRAN ETEXILATE MESYLATE 150 MG PO CAPS
150.0000 mg | ORAL_CAPSULE | Freq: Two times a day (BID) | ORAL | Status: DC
  Administered 2019-07-04 – 2019-07-12 (×16): 150 mg via ORAL
  Filled 2019-07-04 (×16): qty 1

## 2019-07-04 MED ORDER — SORBITOL 70 % SOLN
30.0000 mL | Freq: Every day | Status: DC | PRN
  Administered 2019-07-05: 30 mL via ORAL
  Filled 2019-07-04: qty 30

## 2019-07-04 MED ORDER — ACETAMINOPHEN 160 MG/5ML PO SOLN
650.0000 mg | Freq: Four times a day (QID) | ORAL | Status: DC | PRN
  Administered 2019-07-08: 650 mg via ORAL
  Filled 2019-07-04 (×2): qty 20.3

## 2019-07-04 MED ORDER — TRAMADOL HCL 50 MG PO TABS
50.0000 mg | ORAL_TABLET | Freq: Four times a day (QID) | ORAL | Status: DC | PRN
  Administered 2019-07-04 – 2019-07-12 (×16): 50 mg via ORAL
  Filled 2019-07-04 (×16): qty 1

## 2019-07-04 MED ORDER — METOPROLOL SUCCINATE ER 50 MG PO TB24
150.0000 mg | ORAL_TABLET | Freq: Every day | ORAL | Status: DC
  Administered 2019-07-05 – 2019-07-12 (×7): 150 mg via ORAL
  Filled 2019-07-04 (×8): qty 3

## 2019-07-04 MED ORDER — FUROSEMIDE 20 MG PO TABS: 20.0000 mg | ORAL_TABLET | Freq: Every day | ORAL | Status: DC

## 2019-07-04 MED ORDER — DABIGATRAN ETEXILATE MESYLATE 150 MG PO CAPS: 150.0000 mg | ORAL_CAPSULE | Freq: Two times a day (BID) | ORAL | Status: DC

## 2019-07-04 MED ORDER — CLOPIDOGREL BISULFATE 75 MG PO TABS: 75.0000 mg | ORAL_TABLET | Freq: Every day | ORAL | Status: DC

## 2019-07-04 MED ORDER — ONDANSETRON HCL 4 MG PO TABS: 4.0000 mg | ORAL_TABLET | Freq: Three times a day (TID) | ORAL | Status: DC | PRN

## 2019-07-04 MED ORDER — FUROSEMIDE 20 MG PO TABS
20.0000 mg | ORAL_TABLET | Freq: Every day | ORAL | Status: DC
  Administered 2019-07-05 – 2019-07-12 (×8): 20 mg via ORAL
  Filled 2019-07-04 (×8): qty 1

## 2019-07-04 MED ORDER — METOPROLOL SUCCINATE ER 50 MG PO TB24: 150.0000 mg | ORAL_TABLET | Freq: Every day | ORAL | Status: DC

## 2019-07-04 MED ORDER — ATORVASTATIN CALCIUM 80 MG PO TABS
80.0000 mg | ORAL_TABLET | Freq: Every day | ORAL | Status: DC
  Administered 2019-07-04 – 2019-07-11 (×8): 80 mg via ORAL
  Filled 2019-07-04 (×8): qty 1

## 2019-07-04 MED ORDER — LOSARTAN POTASSIUM 25 MG PO TABS
12.5000 mg | ORAL_TABLET | Freq: Every day | ORAL | Status: DC
  Administered 2019-07-05 – 2019-07-12 (×8): 12.5 mg via ORAL
  Filled 2019-07-04 (×8): qty 0.5

## 2019-07-04 MED ORDER — LOSARTAN POTASSIUM 25 MG PO TABS: 12.5000 mg | ORAL_TABLET | Freq: Every day | ORAL | Status: DC

## 2019-07-04 MED ORDER — CLOPIDOGREL BISULFATE 75 MG PO TABS
75.0000 mg | ORAL_TABLET | Freq: Every day | ORAL | Status: DC
  Administered 2019-07-05 – 2019-07-12 (×8): 75 mg via ORAL
  Filled 2019-07-04 (×8): qty 1

## 2019-07-04 MED ORDER — LEVOTHYROXINE SODIUM 88 MCG PO TABS
88.0000 ug | ORAL_TABLET | Freq: Every day | ORAL | Status: DC
  Administered 2019-07-05 – 2019-07-12 (×8): 88 ug via ORAL
  Filled 2019-07-04 (×8): qty 1

## 2019-07-04 MED ORDER — ATORVASTATIN CALCIUM 80 MG PO TABS: 80.0000 mg | ORAL_TABLET | Freq: Every day | ORAL | Status: DC

## 2019-07-04 NOTE — Progress Notes (Addendum)
Erick Colace, MD  Physician  Physical Medicine and Rehabilitation  Consult Note  Signed  Date of Service:  07/02/2019  8:32 AM      Related encounter: Admission (Current) from 06/28/2019 in [facility] 3E HF PCU      Signed      Expand AllCollapse All   Show:Clear all [x] Manual[x] Template[] Copied  Added by: [x] Angiulli, , PA-C[x] Kirsteins, , MD  [] Hover for details          Physical Medicine and Rehabilitation Consult Reason for Consult: Altered mental status Referring Physician: Dr. Mcarthur Rossetti     HPI: Courtney Chan is a 86 y.o. right-handed female with history of atrial fibrillation, hyperlipidemia, hypertension, CAD with PCI to the RCA and circumflex 2010.  Per chart review patient lives with spouse.  Independent prior to admission.  1 level home.  Patient very active prior to admission.  Presented on 06/28/2019 with persistent chest discomfort and was brought to the emergency room at [facility].  She was in the process of waiting in the waiting room when she became unresponsive and pulseless.  Patient appeared to be in ventricular tachycardia.  She did undergo defibrillation x1 with additional 3 to 4 minutes of CPR.  She was found to have non-STEMI with PCI completed per cardiology services.  On 06/29/2019 after extubation patient with altered mental status not following commands.  MRI and imaging showed small acute infarct involving the posterior aspect of the left superior frontal gyrus.  CT angiogram of head and neck with no emergent vascular findings.  Echocardiogram with ejection fraction of 60% without emboli.  EEG negative for seizure.  Neurology follow-up patient currently is maintained on Plavix as well as Pradaxa.  Tolerating a regular diet.  Therapy evaluations completed with recommendations of physical medicine rehab consult. Discussed with patient's nurse, working on diuresis today, has periwick because of frequent urination.  Generally has good  bladder control   Review of Systems  Constitutional: Negative for fever.  HENT: Negative for hearing loss.   Eyes: Negative for blurred vision and double vision.  Respiratory: Negative for cough and shortness of breath.   Cardiovascular: Positive for palpitations and leg swelling. Negative for chest pain.  Gastrointestinal: Positive for constipation. Negative for heartburn and vomiting.  Genitourinary: Negative for dysuria, flank pain and hematuria.  Musculoskeletal: Positive for myalgias.  Skin: Negative for rash.  All other systems reviewed and are negative.       Past Medical History:  Diagnosis Date  . Atrial fibrillation (HCC) 2011  . Hyperlipidemia    . Hypertension    . Stroke (HCC)    . Thyroid disease           Past Surgical History:  Procedure Laterality Date  . ABDOMINAL HYSTERECTOMY      . BREAST BIOPSY Left 1980    Negative  . CORONARY/GRAFT ACUTE MI REVASCULARIZATION N/A 06/28/2019    Procedure: Coronary/Graft Acute MI Revascularization;  Surgeon: 07/01/2019, MD;  Location: [facility] INVASIVE CV LAB;  Service: Cardiovascular;  Laterality: N/A;  . LEFT HEART CATH AND CORONARY ANGIOGRAPHY N/A 06/28/2019    Procedure: LEFT HEART CATH AND CORONARY ANGIOGRAPHY;  Surgeon: Marykay Lex, MD;  Location: [facility] INVASIVE CV LAB;  Service: Cardiovascular;  Laterality: N/A;         Family History  Problem Relation Age of Onset  . Breast cancer Maternal Grandmother          55's    Social History:  reports  that she has never smoked. She has never used smokeless tobacco. She reports that she does not drink alcohol or use drugs. Allergies:       Allergies  Allergen Reactions  . Metronidazole Diarrhea and Nausea And Vomiting  . Oxycodone Other (See Comments)      Altered mental status          Medications Prior to Admission  Medication Sig Dispense Refill  . levothyroxine (SYNTHROID, LEVOTHROID) 88 MCG tablet Take 88 mcg by mouth daily.           Home: Home  Living Family/patient expects to be discharged to:: Private residence Living Arrangements: Spouse/significant other Available Help at Discharge: Family(pt's husband available, her daughter Oley Balm live close by as well, one son lives in [place]) Type of Home: House Home Access: Level entry Home Layout: One level Bathroom Accessibility: Yes Home Equipment: Environmental consultant - 2 wheels, Cane - single point  Functional History: Prior Function Level of Independence: Independent Comments: pt has been very active and walks ~ 2 miles every morning with no device. she enjoys gardening and her and her husband both take care of the yard and cooking/cleaning. she is independent for all ADL's. Functional Status:  Mobility: Bed Mobility Overal bed mobility: Needs Assistance Bed Mobility: Supine to Sit Supine to sit: Min assist, HOB elevated General bed mobility comments: assist to elevate trunk into sitting  Transfers Overall transfer level: Needs assistance Equipment used: Rolling walker (2 wheeled), 1 person hand held assist Transfers: Stand Pivot Transfers, Sit to/from Stand Sit to Stand: Min assist Stand pivot transfers: Mod assist General transfer comment: assist to power up into standing and for balance; cues for safety and for use of AD Ambulation/Gait Ambulation/Gait assistance: Mod assist Gait Distance (Feet): 25 Feet Assistive device: Rolling walker (2 wheeled) Gait Pattern/deviations: Decreased stride length, Step-through pattern, Staggering left, Staggering right, Trunk flexed General Gait Details: assistance required for balance and managing RW; pt with multiple lateral LOB requiring assist to recover Gait velocity: decreased   ADL:   Cognition: Cognition Overall Cognitive Status: Impaired/Different from baseline Orientation Level: Oriented X4 Cognition Arousal/Alertness: Awake/alert Behavior During Therapy: Impulsive Overall Cognitive Status: Impaired/Different from baseline Area of  Impairment: Memory, Safety/judgement, Problem solving, Following commands Orientation Level: (slight impairments but pt able to correct them with cues) Memory: Decreased short-term memory Following Commands: Follows one step commands inconsistently Safety/Judgement: Decreased awareness of deficits, Decreased awareness of safety Problem Solving: Requires verbal cues, Difficulty sequencing General Comments: pt is impulsive to stand and with near fall attempting transfer BSC to EOB   Blood pressure 119/86, pulse 86, temperature 97.6 F (36.4 C), temperature source Oral, resp. rate 20, height 5\' 4"  (1.626 m), weight 66.9 kg, SpO2 93 %. Physical Exam  Neurological:  Patient is alert sitting up in bed no acute distress oriented x3 and follows commands.      General: No acute distress Mood and affect are appropriate Heart: Regular rate and rhythm no rubs murmurs or extra sounds Lungs: Decreased breath sounds at bases clear to auscultation, breathing unlabored, no rales or wheezes Abdomen: Positive bowel sounds, soft nontender to palpation, nondistended Extremities: No clubbing, cyanosis, or edema, no calf tenderness to palpation negative Homans Skin: No evidence of breakdown, no evidence of rash Neurologic: Cranial nerves II through XII intact, motor strength is 4/5 in bilateral deltoid, bicep, tricep, grip, hip flexor, knee extensors, ankle dorsiflexor and plantar flexor Sensory exam normal sensation to light touch  in bilateral upper and lower extremities Cerebellar  exam normal finger to nose to finger as well as heel to shin in bilateral upper extremities Musculoskeletal: Full range of motion in all 4 extremities.  Multiple joint deformities both hands DIPs and PIPs Lab Results Last 24 Hours       Results for orders placed or performed during the hospital encounter of 06/28/19 (from the past 24 hour(s))  CBC     Status: Abnormal    Collection Time: 07/02/19 12:21 AM  Result Value Ref Range     WBC 10.6 (H) 4.0 - 10.5 K/uL    RBC 3.79 (L) 3.87 - 5.11 MIL/uL    Hemoglobin 11.5 (L) 12.0 - 15.0 g/dL    HCT 21.2 24.8 - 25.0 %    MCV 95.3 80.0 - 100.0 fL    MCH 30.3 26.0 - 34.0 pg    MCHC 31.9 30.0 - 36.0 g/dL    RDW 03.7 04.8 - 88.9 %    Platelets 344 150 - 400 K/uL    nRBC 0.0 0.0 - 0.2 %  Basic metabolic panel     Status: Abnormal    Collection Time: 07/02/19 12:21 AM  Result Value Ref Range    Sodium 140 135 - 145 mmol/L    Potassium 3.1 (L) 3.5 - 5.1 mmol/L    Chloride 100 98 - 111 mmol/L    CO2 27 22 - 32 mmol/L    Glucose, Bld 149 (H) 70 - 99 mg/dL    BUN 21 8 - 23 mg/dL    Creatinine, Ser 1.69 (H) 0.44 - 1.00 mg/dL    Calcium 8.9 8.9 - 45.0 mg/dL    GFR calc non Af Amer 43 (L) >60 mL/min    GFR calc Af Amer 50 (L) >60 mL/min    Anion gap 13 5 - 15       Imaging Results (Last 48 hours)  MR BRAIN WO CONTRAST   Result Date: 06/30/2019 CLINICAL DATA:  Stroke follow-up EXAM: MRI HEAD WITHOUT CONTRAST TECHNIQUE: Multiplanar, multiecho pulse sequences of the brain and surrounding structures were obtained without intravenous contrast. COMPARISON:  Head CT June 28, 2019 FINDINGS: Brain: A focus of restricted diffusion is seen in the posterior aspect of the left superior frontal gyrus, consistent with an acute infarct. Area of encephalomalacia and gliosis is seen involving the posterior right insula extending into the right temporal lobe. Scattered and confluent foci of T2 hyperintensity are seen within the white matter of the cerebral hemispheres, nonspecific, most likely related to chronic small vessel ischemia. Prominence of the supratentorial ventricles, cerebral and cerebellar sulci reflecting parenchymal volume loss. No hemorrhage, extra-axial collection or mass lesion. Vascular: Normal flow voids. Skull and upper cervical spine: Normal marrow signal. Sinuses/Orbits: Bilateral lens surgery noted. Visualized sinuses are clear. Other: None. IMPRESSION: 1. Small acute  infarct involving the posterior aspect of the left superior frontal gyrus. 2. Encephalomalacia and gliosis involving the posterior right insula extending into the right temporal lobe. 3. Chronic small vessel ischemia and parenchymal volume loss. Electronically Signed   By: Baldemar Lenis M.D.   On: 06/30/2019 13:32    DG Chest Port 1 View   Result Date: 07/01/2019 CLINICAL DATA:  Shortness of breath.  CHF. EXAM: PORTABLE CHEST 1 VIEW COMPARISON:  06/30/2019 FINDINGS: Normal heart size. Probable small bilateral pleural effusions. No pneumothorax. Extremely low lung volumes. Perihilar and basilar predominant interstitial and airspace disease is felt to be slightly improved. IMPRESSION: Minimal improvement in aeration. Interstitial and airspace disease, at least  partially felt to represent interstitial edema and atelectasis. Infection at the lung bases cannot be excluded. Electronically Signed   By: Abigail Miyamoto M.D.   On: 07/01/2019 09:42         Assessment/Plan: Diagnosis: Left superior frontal infarct and debility following V. tach arrest 1. Does the need for close, 24 hr/day medical supervision in concert with the patient's rehab needs make it unreasonable for this patient to be served in a less intensive setting? Yes 2. Co-Morbidities requiring supervision/potential complications: Congestive heart failure-diastolic, atrial fibrillation, history of coronary artery disease status post stenting 3. Due to bladder management, bowel management, safety, skin/wound care, disease management, medication administration, pain management and patient education, does the patient require 24 hr/day rehab nursing? Yes 4. Does the patient require coordinated care of a physician, rehab nurse, therapy disciplines of PT, OT to address physical and functional deficits in the context of the above medical diagnosis(es)? Yes Addressing deficits in the following areas: balance, endurance, locomotion, strength,  transferring, bowel/bladder control, bathing, dressing, feeding, toileting and psychosocial support 5. Can the patient actively participate in an intensive therapy program of at least 3 hrs of therapy per day at least 5 days per week? Yes 6. The potential for patient to make measurable gains while on inpatient rehab is good 7. Anticipated functional outcomes upon discharge from inpatient rehab are modified independent and supervision  with PT, modified independent and supervision with OT, n/a with SLP. 8. Estimated rehab length of stay to reach the above functional goals is: 10 to 14 days 9. Anticipated discharge destination: Home 10. Overall Rehab/Functional Prognosis: good   RECOMMENDATIONS: This patient's condition is appropriate for continued rehabilitative care in the following setting: CIR Patient has agreed to participate in recommended program. Yes Note that insurance prior authorization may be required for reimbursement for recommended care.   Comment: Needs additional diuresis before she is ready for more aggressive rehabilitation.  Should be ready tomorrow     Cathlyn Parsons, PA-C 07/02/2019    "I have personally performed a face to face diagnostic evaluation of this patient.  Additionally, I have reviewed and concur with the physician assistant's documentation above." Charlett Blake M.D. [facility] FAAPM&R (Neuromuscular Med) Diplomate Am Board of Electrodiagnostic Med Fellow Am Board of Interventional Pain          Revision History                     Routing History

## 2019-07-04 NOTE — Progress Notes (Signed)
Standley Brooking, RN  Rehab Admission Coordinator  Physical Medicine and Rehabilitation  PMR Pre-admission  Signed  Date of Service:  07/02/2019  1:41 PM      Related encounter: Admission (Current) from 06/28/2019 in [facility] 3E HF PCU      Signed        Show:Clear all [x] Manual[x] Template[x] Copied  Added by: [x] , RN  [] Hover for details PMR Admission Coordinator Pre-Admission Assessment   Patient: Courtney Chan is an 86 y.o., female MRN: DOB: Jun 26, 1930 Height: 5\' 4"  (162.6 cm) Weight: 68.5 kg                                                                                                                                                  Insurance Information HMO:     PPO: yes     PCP:      IPA:      80/20:      OTHER:  PRIMARY: [employer]      Policy#:[id number]       Subscriber: pt CM Name: Dot      Phone#: [phone] ext [phone]    Fax#: 02/16/1933 Pre-Cert#: approved for 7 days with f/u [facility] ext [id number] same fax      Employer:  Benefits:  Phone #: [phone]     Name: 2/19 Eff. Date: 05/15/2019     Deduct: none      Out of Pocket Max: $4000      Life Max: none CIR: $160 co pay per day days 1 until 10      SNF: no copay days 1 until 20; $50 co pay per days days 21 until 100 Outpatient: $20 per visit     Co-Pay: visitw per medical necceisty Home Health: 100%      Co-Pay: visits per medical necceisty DME: 80%     Co-Pay: 20% Providers: in netowrk  SECONDARY: none        Medicaid Application Date:       Case Manager:  Disability Application Date:       Case Worker:    The "Data Collection Information Summary" for patients in Inpatient Rehabilitation Facilities with attached "Privacy Act Statement-Health Care Records" was provided and verbally reviewed with: Patient and Family   Emergency Contact Information         Contact Information     Name Relation Home Work [place] C [phone] 3/19         Courtney Chan, Courtney Chan [phone]        [place] Daughter [phone]        [place], Vanaken     [phone]       Current Medical History  Patient Admitting Diagnosis: CVA and debility following V. Tach arrest   History of Present Illness:: Courtney Chan is a  86 y.o. right-handed female with history of atrial fibrillation, hyperlipidemia, hypertension, CAD with PCI to the RCA and circumflex 2010.   Patient very active prior to admission.  Presented on 06/27/2018 with persistent chest discomfort and was brought to the emergency room at [facility].  She was in the process of waiting in the waiting room when she became unresponsive and pulseless.  Patient appeared to be in ventricular tachycardia.  She did undergo defibrillation x1 with additional 3 to 4 minutes of CPR.  She was found to have non-STEMI with PCI completed per cardiology services.  On 06/28/2018 after extubation patient with altered mental status not following commands.  MRI and imaging showed small acute infarct involving the posterior aspect of the left superior frontal gyrus.  CT angiogram of head and neck with no emergent vascular findings.  Echocardiogram with ejection fraction of 60% without emboli.  EEG negative for seizure.  Neurology follow-up patient currently is maintained on Plavix as well as Pradaxa.  Tolerating a regular diet.     Past Medical History      Past Medical History:  Diagnosis Date  . Atrial fibrillation (HCC) 2011  . Hyperlipidemia    . Hypertension    . Stroke (HCC)    . Thyroid disease        Family History  family history includes Breast cancer in her maternal grandmother.   Prior Rehab/Hospitalizations:  Has the patient had prior rehab or hospitalizations prior to admission? Yes   Has the patient had major surgery during 100 days prior to admission? Yes   Current Medications    Current Facility-Administered Medications:  .  acetaminophen (TYLENOL) 160 MG/5ML solution 650 mg, 650 mg,  Oral, Q6H PRN, Laverda Page B, NP, 650 mg at 07/02/19 0802 .  atorvastatin (LIPITOR) tablet 80 mg, 80 mg, Oral, q1800, Marjie Skiff E, PA-C, 80 mg at 07/03/19 1728 .  chlorhexidine (PERIDEX) 0.12 % solution 15 mL, 15 mL, Mouth Rinse, BID, Agarwala, Ravi, MD, 15 mL at 07/04/19 1015 .  Chlorhexidine Gluconate Cloth 2 % PADS 6 each, 6 each, Topical, Daily, Lynnell Catalan, MD, 6 each at 07/04/19 1006 .  clopidogrel (PLAVIX) tablet 75 mg, 75 mg, Oral, Daily, Mosetta Anis, RPH, 75 mg at 07/04/19 0954 .  dabigatran (PRADAXA) capsule 150 mg, 150 mg, Oral, Q12H, Kathleene Hazel, MD, 150 mg at 07/04/19 0954 .  furosemide (LASIX) tablet 20 mg, 20 mg, Oral, Daily, Kathleene Hazel, MD, 20 mg at 07/04/19 0955 .  levothyroxine (SYNTHROID) tablet 88 mcg, 88 mcg, Oral, Q0600, Laverda Page B, NP, 88 mcg at 07/04/19 0540 .  lidocaine (LIDODERM) 5 % 1 patch, 1 patch, Transdermal, Q24H, Laverda Page B, NP, 1 patch at 07/04/19 [phone] .  losartan (COZAAR) tablet 12.5 mg, 12.5 mg, Oral, Daily, Bhagat, Bhavinkumar, PA, 12.5 mg at 07/04/19 0954 .  MEDLINE mouth rinse, 15 mL, Mouth Rinse, q12n4p, Agarwala, Ravi, MD, 15 mL at 07/03/19 1728 .  metoprolol succinate (TOPROL-XL) 24 hr tablet 150 mg, 150 mg, Oral, Daily, Bhagat, Bhavinkumar, PA, 150 mg at 07/04/19 0954 .  traMADol (ULTRAM) tablet 50 mg, 50 mg, Oral, Q6H PRN, Mosetta Anis, RPH, 50 mg at 07/03/19 1739   Patients Current Diet:     Diet Order                      Diet regular Room service appropriate? Yes; Fluid consistency: Thin  Diet effective now  Precautions / Restrictions Precautions Precautions: Fall Precaution Comments: chest pain from compressions Restrictions Weight Bearing Restrictions: No    Has the patient had 2 or more falls or a fall with injury in the past year?No   Prior Activity Level Community (5-7x/wk): very independent and active   Prior Functional Level Prior  Function Level of Independence: Independent Comments: pt has been very active and walks ~ 2 miles every morning with no device. she enjoys gardening and her and her husband both take care of the yard and cooking/cleaning. she is independent for all ADL's.   Self Care: Did the patient need help bathing, dressing, using the toilet or eating?  Independent   Indoor Mobility: Did the patient need assistance with walking from room to room (with or without device)? Independent   Stairs: Did the patient need assistance with internal or external stairs (with or without device)? Independent   Functional Cognition: Did the patient need help planning regular tasks such as shopping or remembering to take medications? Independent   Home Assistive Devices / Equipment Home Assistive Devices/Equipment: None Home Equipment: Walker - 2 wheels, Cane - single point   Prior Device Use: Indicate devices/aids used by the patient prior to current illness, exacerbation or injury? None of the above   Current Functional Level Cognition   Overall Cognitive Status: Impaired/Different from baseline Orientation Level: Oriented X4 Following Commands: Follows one step commands consistently Safety/Judgement: Decreased awareness of deficits General Comments: pt presents with STM deficits and son present and also endorses this stating pt has been asking similar questions twice. Overall has poor recall to current deficits    Extremity Assessment (includes Sensation/Coordination)   Upper Extremity Assessment: Generalized weakness  Lower Extremity Assessment: Defer to PT evaluation     ADLs   Overall ADL's : Needs assistance/impaired Eating/Feeding: Set up, Sitting Grooming: Min guard, Standing Upper Body Bathing: Minimal assistance, Sitting Lower Body Bathing: Minimal assistance, Moderate assistance, Sit to/from stand, Sitting/lateral leans Upper Body Dressing : Minimal assistance, Sitting Lower Body Dressing:  Minimal assistance, Moderate assistance, Sit to/from stand Lower Body Dressing Details (indicate cue type and reason): limited from chest pain Toilet Transfer: Min guard, Regular Toilet, Grab bars, RW Toileting- Clothing Manipulation and Hygiene: Minimal assistance, Sit to/from stand Tub/ Banker: Minimal assistance, Ambulation, Shower seat, Rolling walker Functional mobility during ADLs: Min guard, Rolling walker, Cueing for safety General ADL Comments: pt needing safety cues to complete BADL. Also presents generally weak and fatigues easily from baseline     Mobility   Overal bed mobility: Needs Assistance Bed Mobility: Rolling, Sidelying to Sit Rolling: Min assist Sidelying to sit: Mod assist Supine to sit: HOB elevated, Min guard General bed mobility comments: up in recliner     Transfers   Overall transfer level: Needs assistance Equipment used: Rolling walker (2 wheeled) Transfers: Sit to/from Stand Sit to Stand: Min guard, Min assist Stand pivot transfers: Mod assist General transfer comment: assist to rise and steady, up to min A for balance     Ambulation / Gait / Stairs / Wheelchair Mobility   Ambulation/Gait Ambulation/Gait assistance: Mod assist, Min assist Gait Distance (Feet): (~60 ft total in room) Assistive device: 1 person hand held assist Gait Pattern/deviations: Decreased stride length, Step-through pattern, Drifts right/left General Gait Details: assist for balance; increased need for assistance with challenges Gait velocity: decreased     Posture / Balance Balance Overall balance assessment: Needs assistance Sitting-balance support: Feet supported Sitting balance-Leahy Scale: Fair Standing balance support: During  functional activity, Single extremity supported Standing balance-Leahy Scale: Poor Standing balance comment: requires external support High level balance activites: Side stepping, Backward walking, Head turns High Level Balance Comments:  pt requires assistance to maintain balance with horizontal head turns being most difficult and pt with lateral LOB     Special needs/care consideration BiPAP/CPAP Continuous Drip IV Dialysis        Life Vest Oxygen Special Bed Trach Size Wound Vac Skin                               Bowel mgmt:continent Bladder mgmt:external catheter Diabetic mgmt Hgb A1c 6.1 Behavioral consideration  Chemo/radiation  Designated visitor is spouse, Leonette Most    Previous Home Environment  Living Arrangements: Spouse/significant other  Lives With: Spouse, Family Available Help at Discharge: Family, Available 24 hours/day Type of Home: House Home Layout: One level Home Access: Level entry Bathroom Shower/Tub: Associate Professor: Yes Home Care Services: No   Discharge Living Setting Plans for Discharge Living Setting: Patient's home, Lives with (comment)(spouse) Type of Home at Discharge: House Discharge Home Layout: One level Discharge Home Access: Level entry Discharge Bathroom Shower/Tub: Tub/shower unit Discharge Bathroom Toilet: Standard Discharge Bathroom Accessibility: Yes How Accessible: Accessible via walker Does the patient have any problems obtaining your medications?: No   Social/Family/Support Systems Patient Roles: Spouse, Parent Contact Information: spouse and son Anticipated Caregiver: family Anticipated Caregiver's Contact Information: see above Ability/Limitations of Caregiver: no limits Caregiver Availability: 24/7 Discharge Plan Discussed with Primary Caregiver: Yes Is Caregiver In Agreement with Plan?: Yes Does Caregiver/Family have Issues with Lodging/Transportation while Pt is in Rehab?: No     Goals/Additional Needs Patient/Family Goal for Rehab: Mod I to supervision with PT and OT Expected length of stay: ELOS 10 to 14 days, but patient and family want to get home sooner for they feel she will do better at home once  at a manageable level Pt/Family Agrees to Admission and willing to participate: Yes Program Orientation Provided & Reviewed with Pt/Caregiver Including Roles  & Responsibilities: Yes   Decrease burden of Care through IP rehab admission:    Possible need for SNF placement upon discharge:   Patient Condition: This patient's condition remains as documented in the consult dated 07/02/2019, in which the Rehabilitation Physician determined and documented that the patient's condition is appropriate for intensive rehabilitative care in an inpatient rehabilitation facility. Will admit to inpatient rehab today.   Preadmission Screen Completed By:  Clois Dupes, RN, 07/04/2019 10:36 AM ______________________________________________________________________   Discussed status with Dr. Allena Katz on 07/04/2019 at  1037 and received approval for admission today.   Admission Coordinator:  Clois Dupes, time 5809 Date 07/04/2019         Cosigned by: Marcello Fennel, MD at 07/04/2019 11:36 AM  Revision History

## 2019-07-04 NOTE — Progress Notes (Signed)
Progress Note  Patient Name: Courtney Chan Date of Encounter: 07/04/2019  Primary Cardiologist: Marcina Millard, MD   Subjective   Resting comfortably in bed.  Yesterday she did cough up a small clot of blood.  This morning, this has cleared.  Still feeling some soreness in her chest.  Breathing comfortably.  Inpatient Medications    Scheduled Meds: . atorvastatin  80 mg Oral q1800  . chlorhexidine  15 mL Mouth Rinse BID  . Chlorhexidine Gluconate Cloth  6 each Topical Daily  . clopidogrel  75 mg Oral Daily  . dabigatran  150 mg Oral Q12H  . furosemide  20 mg Oral Daily  . levothyroxine  88 mcg Oral Q0600  . lidocaine  1 patch Transdermal Q24H  . losartan  12.5 mg Oral Daily  . mouth rinse  15 mL Mouth Rinse q12n4p  . metoprolol succinate  150 mg Oral Daily   Continuous Infusions:  PRN Meds: acetaminophen (TYLENOL) oral liquid 160 mg/5 mL, traMADol   Vital Signs    Vitals:   07/03/19 1158 07/03/19 2015 07/04/19 0447 07/04/19 0457  BP: 105/65 126/76 109/60   Pulse: 85 79 (!) 107   Resp: 16 16 16    Temp: 97.6 F (36.4 C) 98.2 F (36.8 C) (!) 97.4 F (36.3 C)   TempSrc: Oral Oral Oral   SpO2: 94% 94% 95%   Weight:    68.5 kg  Height:        Intake/Output Summary (Last 24 hours) at 07/04/2019 1048 Last data filed at 07/04/2019 07/06/2019 Gross per 24 hour  Intake 900 ml  Output 275 ml  Net 625 ml   Last 3 Weights 07/04/2019 07/03/2019 07/02/2019  Weight (lbs) 151 lb 0.2 oz 149 lb 7.6 oz 147 lb 7.8 oz  Weight (kg) 68.5 kg 67.8 kg 66.9 kg      Telemetry    Atrial fibrillation heart rates have been in the 80s to 90s during rest- Personally Reviewed  ECG    No new- Personally Reviewed  Physical Exam   GEN: No acute distress.  Comfortable in bed Neck: No JVD Cardiac: IRRR, no murmurs, rubs, or gallops.  Respiratory: Clear to auscultation bilaterally. GI: Soft, nontender, non-distended  MS: No edema; No deformity. Neuro:  Nonfocal  Psych: Normal  affect   Labs    High Sensitivity Troponin:   Recent Labs  Lab 06/28/19 1302 06/28/19 1522  TROPONINIHS 11,576* 13,408*      Chemistry Recent Labs  Lab 06/29/19 1200 06/30/19 0215 07/01/19 0507 07/02/19 0021 07/03/19 0435  NA 142   < > 144 140 143  K 4.4   < > 3.9 3.1* 4.2  CL 109   < > 108 100 108  CO2 23   < > 22 27 27   GLUCOSE 115*   < > 91 149* 116*  BUN 18   < > 18 21 20   CREATININE 1.24*   < > 1.02* 1.15* 0.86  CALCIUM 8.4*   < > 9.0 8.9 8.7*  PROT 5.4*  --   --   --   --   ALBUMIN 2.9*  --   --   --   --   AST 70*  --   --   --   --   ALT 29  --   --   --   --   ALKPHOS 71  --   --   --   --   BILITOT 0.8  --   --   --   --  GFRNONAA 39*   < > 50* 43* >60  GFRAA 46*   < > 58* 50* >60  ANIONGAP 10   < > 14 13 8    < > = values in this interval not displayed.     Hematology Recent Labs  Lab 07/01/19 0507 07/02/19 0021 07/03/19 0435  WBC 10.4 10.6* 9.0  RBC 3.96 3.79* 3.69*  HGB 12.0 11.5* 11.1*  HCT 38.4 36.1 35.3*  MCV 97.0 95.3 95.7  MCH 30.3 30.3 30.1  MCHC 31.3 31.9 31.4  RDW 14.2 14.0 14.4  PLT 274 344 359    BNPNo results for input(s): BNP, PROBNP in the last 168 hours.   DDimer No results for input(s): DDIMER in the last 168 hours.   Radiology    No results found.  Cardiac Studies   Cath, echo reviewed  Patient Profile     86 y.o. female cardiac arrest, new circumflex stent, non-STEMI, normal EF, stroke, persistent atrial fibrillation  Assessment & Plan    Cardiac arrest non-STEMI CAD -95% stenosis mid to distal circumflex beyond previously placed stent with successful DES PCI of left circumflex with stent overlapping proximal stent resolute Onyx DES 2 mm x 15 mm - 2.3 mm -Echo with normal EF -Rib fractures, chest wall pain -No aspirin -On both Pradaxa as well as Plavix. -Beta-blocker.  Persistent atrial fibrillation -Pradaxa, metoprolol, rate in the 80s.  Stroke -Neurology team following.  Small left MCA.   Anticoagulation.  Chronic diastolic heart failure -Euvolemic, low-dose Lasix p.o.  Hypertension -Stable on beta-blocker and angiotensin receptor blocker  Hyperlipidemia -LDL 61.  Continue with high intensity statin.  Hemoptysis -Improved.  She did cough up a small clot of blood yesterday.  Okay with DC to rehab  For questions or updates, please contact [place] Please consult [email or web address] for contact info under        Signed, Candee Furbish, MD  07/04/2019, 10:48 AM

## 2019-07-04 NOTE — H&P (Addendum)
Physical Medicine and Rehabilitation Admission H&P     HPI: Courtney Chan is an 86 year old right-handed female with history of atrial fibrillation followed by Dr.Paraschos at [facility] clinic , hyperlipidemia, hypertension, CKD with creatinine 1.05-1.18 CAD with PCI to RCA and circumflex 2010.  History taken from chart review, son, and patient due to cognitive slowing.  Patient lives with spouse.  Independent prior to admission.  1 level home.  Patient very active prior to admission.  She presented on 06/28/2019 with chest pain to [facility].  She was in the process of waiting in the waiting room when she became unresponsive and pulseless.  Patient appeared to be in ventricular tachycardia.  She did undergo defibrillation x1 with additional 3 to 4 minutes of CPR.  She was found to have NSTEMI with PCI completed per cardiology services.  On 06/29/2019 after extubation patient had altered mental status and was not following commands.  MRI brain ordered, personally reviewed, small acute infarct.  Per report,small acute infarct involving the posterior aspect of the left superior frontal gyrus.  CT angiogram of head and neck with no emergent vascular findings.  Echocardiogram with ejection fraction of 60% without emboli.  EEG negative for seizures.  Neurology follow-up maintained on Plavix as well as Pradaxa.  Tolerating a regular diet.  Therapy evaluations completed with recommendations of physical medicine rehab consult.  Patient was admitted for a comprehensive rehab program.  Please see preadmission assessment from earlier today as well.  Review of Systems  Constitutional: Positive for malaise/fatigue. Negative for chills and fever.  HENT: Negative for hearing loss.   Eyes: Negative for blurred vision and double vision.  Respiratory: Negative for cough and shortness of breath.   Gastrointestinal: Positive for constipation. Negative for heartburn, nausea and vomiting.  Genitourinary: Negative for  dysuria, flank pain and hematuria.  Musculoskeletal: Positive for joint pain and myalgias.  Skin: Negative for rash.  Neurological: Positive for weakness. Negative for sensory change, speech change and focal weakness.   Past Medical History:  Diagnosis Date  . Atrial fibrillation ([place]) 2011  . Hyperlipidemia   . Hypertension   . Stroke ([place])   . Thyroid disease    Past Surgical History:  Procedure Laterality Date  . ABDOMINAL HYSTERECTOMY    . BREAST BIOPSY Left 1980   Negative  . CORONARY/GRAFT ACUTE MI REVASCULARIZATION N/A 06/28/2019   Procedure: Coronary/Graft Acute MI Revascularization;  Surgeon: Leonie Man, MD;  Location: [place] [facility];  Service: Cardiovascular;  Laterality: N/A;  . LEFT HEART CATH AND CORONARY ANGIOGRAPHY N/A 06/28/2019   Procedure: LEFT HEART CATH AND CORONARY ANGIOGRAPHY;  Surgeon: Leonie Man, MD;  Location: [place] [facility];  Service: Cardiovascular;  Laterality: N/A;   Family History  Problem Relation Age of Onset  . Breast cancer Maternal Grandmother        29's   Social History:  reports that she has never smoked. She has never used smokeless tobacco. She reports that she does not drink alcohol or use drugs. Allergies:  Allergies  Allergen Reactions  . Metronidazole Diarrhea and Nausea And Vomiting  . Oxycodone Other (See Comments)    Altered mental status   Medications Prior to Admission  Medication Sig Dispense Refill  . atorvastatin (LIPITOR) 80 MG tablet Take 1 tablet (80 mg total) by mouth daily at 6 PM.    . [START ON 07/05/2019] clopidogrel (PLAVIX) 75 MG tablet Take 1 tablet (75 mg total) by mouth daily.    Marland Kitchen  dabigatran (PRADAXA) 150 MG CAPS capsule Take 1 capsule (150 mg total) by mouth every 12 (twelve) hours. 60 capsule   . [START ON 07/05/2019] furosemide (LASIX) 20 MG tablet Take 1 tablet (20 mg total) by mouth daily. 30 tablet   . levothyroxine (SYNTHROID, LEVOTHROID) 88 MCG tablet Take 88 mcg by mouth daily.      Melene Muller ON 07/05/2019] lidocaine (LIDODERM) 5 % Place 1 patch onto the skin daily. Remove & Discard patch within 12 hours or as directed by MD 30 patch 0  . [START ON 07/05/2019] losartan (COZAAR) 25 MG tablet Take 0.5 tablets (12.5 mg total) by mouth daily.    Melene Muller ON 07/05/2019] metoprolol succinate (TOPROL-XL) 50 MG 24 hr tablet Take 3 tablets (150 mg total) by mouth daily.      Drug Regimen Review Drug regimen was reviewed and remains appropriate with no significant issues identified  Home: Home Living Family/patient expects to be discharged to:: Private residence Living Arrangements: Spouse/significant other Available Help at Discharge: Family, Available 24 hours/day Type of Home: House Home Access: Level entry Home Layout: One level Bathroom Shower/Tub: Engineer, manufacturing systems: Standard Bathroom Accessibility: Yes Home Equipment: Environmental consultant - 2 wheels, Cane - single point  Lives With: Spouse, Family   Functional History: Prior Function Level of Independence: Independent Comments: pt has been very active and walks ~ 2 miles every morning with no device. she enjoys gardening and her and her husband both take care of the yard and cooking/cleaning. she is independent for all ADL's.  Functional Status:  Mobility: Bed Mobility Overal bed mobility: Needs Assistance Bed Mobility: Rolling, Sidelying to Sit Rolling: Min assist Sidelying to sit: Mod assist Supine to sit: HOB elevated, Min guard General bed mobility comments: up in recliner Transfers Overall transfer level: Needs assistance Equipment used: Rolling walker (2 wheeled) Transfers: Sit to/from Stand Sit to Stand: Min guard, Min assist Stand pivot transfers: Mod assist General transfer comment: assist to rise and steady, up to min A for balance Ambulation/Gait Ambulation/Gait assistance: Mod assist, Min assist Gait Distance (Feet): (~60 ft total in room) Assistive device: 1 person hand held assist Gait  Pattern/deviations: Decreased stride length, Step-through pattern, Drifts right/left General Gait Details: assist for balance; increased need for assistance with challenges Gait velocity: decreased    ADL: ADL Overall ADL's : Needs assistance/impaired Eating/Feeding: Set up, Sitting Grooming: Min guard, Standing Upper Body Bathing: Minimal assistance, Sitting Lower Body Bathing: Minimal assistance, Moderate assistance, Sit to/from stand, Sitting/lateral leans Upper Body Dressing : Minimal assistance, Sitting Lower Body Dressing: Minimal assistance, Moderate assistance, Sit to/from stand Lower Body Dressing Details (indicate cue type and reason): limited from chest pain Toilet Transfer: Min guard, Regular Toilet, Grab bars, RW Toileting- Clothing Manipulation and Hygiene: Minimal assistance, Sit to/from stand Tub/ Engineer, structural: Minimal assistance, Ambulation, Shower seat, Rolling walker Functional mobility during ADLs: Min guard, Rolling walker, Cueing for safety General ADL Comments: pt needing safety cues to complete BADL. Also presents generally weak and fatigues easily from baseline  Cognition: Cognition Overall Cognitive Status: Impaired/Different from baseline Orientation Level: Oriented X4 Cognition Arousal/Alertness: Awake/alert Behavior During Therapy: WFL for tasks assessed/performed, Flat affect Overall Cognitive Status: Impaired/Different from baseline Area of Impairment: Memory, Safety/judgement, Problem solving Orientation Level: (slight impairments but pt able to correct them with cues) Memory: Decreased short-term memory Following Commands: Follows one step commands consistently Safety/Judgement: Decreased awareness of deficits Problem Solving: Difficulty sequencing, Requires verbal cues General Comments: pt presents with STM deficits and son  present and also endorses this stating pt has been asking similar questions twice. Overall has poor recall to current  deficits  Physical Exam: Blood pressure 114/65, pulse (!) 101, temperature 98.2 F (36.8 C), temperature source Oral, resp. rate 16, height 5\' 4"  (1.626 m), weight 68.5 kg, SpO2 96 %. Physical Exam  Vitals reviewed. Constitutional: She appears well-developed and well-nourished.  HENT:  Head: Normocephalic and atraumatic.  Eyes: EOM are normal. Right eye exhibits no discharge. Left eye exhibits no discharge.  Neck: No tracheal deviation present. No thyromegaly present.  Respiratory: Effort normal. No respiratory distress.  + Dana  GI: Soft. She exhibits no distension.  Musculoskeletal:     Comments: No edema or tenderness in extremities  Neurological: She is alert.  Patient is alert in no acute distress Makes good eye contact with examiner.   Oriented x3 and follows full commands.   Fair awareness of deficits. Some confusion noted. Motor: 4 --4/5 throughout Sensation intact light touch  Skin: Skin is warm and dry.  Psychiatric: Her affect is blunt. Her speech is delayed.    Results for orders placed or performed during the hospital encounter of 06/28/19 (from the past 48 hour(s))  CBC     Status: Abnormal   Collection Time: 07/03/19  4:35 AM  Result Value Ref Range   WBC 9.0 4.0 - 10.5 K/uL   RBC 3.69 (L) 3.87 - 5.11 MIL/uL   Hemoglobin 11.1 (L) 12.0 - 15.0 g/dL   HCT 07/05/19 (L) 59.9 - 35.7 %   MCV 95.7 80.0 - 100.0 fL   MCH 30.1 26.0 - 34.0 pg   MCHC 31.4 30.0 - 36.0 g/dL   RDW 01.7 79.3 - 90.3 %   Platelets 359 150 - 400 K/uL   nRBC 0.0 0.0 - 0.2 %    Comment: Performed at [facility], [address], [place], [place]  Basic metabolic panel     Status: Abnormal   Collection Time: 07/03/19  4:35 AM  Result Value Ref Range   Sodium 143 135 - 145 mmol/L   Potassium 4.2 3.5 - 5.1 mmol/L   Chloride 108 98 - 111 mmol/L   CO2 27 22 - 32 mmol/L   Glucose, Bld 116 (H) 70 - 99 mg/dL   BUN 20 8 - 23 mg/dL   Creatinine, Ser 07/05/19 0.44 - 1.00 mg/dL   Calcium 8.7  (L) 8.9 - 10.3 mg/dL   GFR calc non Af Amer >60 >60 mL/min   GFR calc Af Amer >60 >60 mL/min   Anion gap 8 5 - 15    Comment: Performed at [facility], [address], [place], [place]   No results found.     Medical Problem List and Plan: 1.  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 fib  -patient may shower  -ELOS/Goals: 8-12 days/supervision.  Admit to CIR 2.  Antithrombotics: -DVT/anticoagulation: Pradaxa 150 mg every 12 hours  -antiplatelet therapy: Plavix 75 mg daily 3. Pain Management: Lidoderm patch, Ultram as needed 4. Mood: Provide emotional support  -antipsychotic agents: N/A 5. Neuropsych: This patient is?  Fully capable of making decisions on her own behalf. 6. Skin/Wound Care: Routine skin checks 7. Fluids/Electrolytes/Nutrition: Routine in and outs.  CMP ordered. 8.  Atrial fibrillation.  Continue Pradaxa.  Cardiac rate controlled.  Continue Toprol 100 mg daily  Monitor with increased exertion 9.  Diastolic congestive heart failure.  Lasix 20 mg daily as well as Cozaar 25 mg daily.  Monitor for any signs of fluid overload  Daily weights 10.  Hypothyroidism.  Synthroid 11.  Hyperlipidemia Lipitor 12.  CKD.  Creatinine baseline 1.05-1.18.    CMP ordered  Charlton Amor, PA-C 07/04/2019  I have personally performed a face to face diagnostic evaluation, including, but not limited to relevant history and physical exam findings, of this patient and developed relevant assessment and plan.  Additionally, I have reviewed and concur with the physician assistant's documentation above.  Maryla Morrow, MD, ABPMR  The patient's status has not changed. The original post admission physician evaluation remains appropriate, and any changes from the pre-admission screening or documentation from the acute chart are noted above.    Maryla Morrow, MD, ABPMR

## 2019-07-04 NOTE — Progress Notes (Signed)
Inpatient Rehabilitation Admissions Coordinator  I have insurance approval to admit pt to inpt rehab today. I spoke with Dr. Anne Fu to arrange d/c and patients is aware. I will make the arrangements to admit today.  Ottie Glazier, RN, MSN Rehab Admissions Coordinator [phone] 07/04/2019 10:35 AM

## 2019-07-04 NOTE — Progress Notes (Signed)
Patient admitted about 1330. Patient alert and oriented x4. Rehab process, fall prevention, booklet and therapy schedule reviewed with patient all questions answered. Patient left in recliner with belongings/call bell within reach. Patient given tramadol 50mg  at 1358 for sternal pain. Heat pad already applied.

## 2019-07-05 ENCOUNTER — Inpatient Hospital Stay (HOSPITAL_COMMUNITY): Payer: Medicare PPO | Admitting: Occupational Therapy

## 2019-07-05 ENCOUNTER — Inpatient Hospital Stay (HOSPITAL_COMMUNITY): Payer: Medicare PPO | Admitting: Physical Therapy

## 2019-07-05 DIAGNOSIS — I5030 Unspecified diastolic (congestive) heart failure: Secondary | ICD-10-CM

## 2019-07-05 DIAGNOSIS — N183 Chronic kidney disease, stage 3 unspecified: Secondary | ICD-10-CM

## 2019-07-05 DIAGNOSIS — I4891 Unspecified atrial fibrillation: Secondary | ICD-10-CM

## 2019-07-05 DIAGNOSIS — E039 Hypothyroidism, unspecified: Secondary | ICD-10-CM

## 2019-07-05 DIAGNOSIS — N189 Chronic kidney disease, unspecified: Secondary | ICD-10-CM

## 2019-07-05 DIAGNOSIS — Z9981 Dependence on supplemental oxygen: Secondary | ICD-10-CM

## 2019-07-05 DIAGNOSIS — I63512 Cerebral infarction due to unspecified occlusion or stenosis of left middle cerebral artery: Secondary | ICD-10-CM

## 2019-07-05 DIAGNOSIS — R5381 Other malaise: Secondary | ICD-10-CM

## 2019-07-05 NOTE — Plan of Care (Signed)
  Problem: Consults Goal: RH STROKE PATIENT EDUCATION Description: See Patient Education module for education specifics  Outcome: Progressing   Problem: RH BOWEL ELIMINATION Goal: RH STG MANAGE BOWEL WITH ASSISTANCE Description: STG Manage Bowel with mod I Assistance. Outcome: Progressing Goal: RH STG MANAGE BOWEL W/MEDICATION W/ASSISTANCE Description: STG Manage Bowel with Medication with mod I Assistance. Outcome: Progressing   Problem: RH BLADDER ELIMINATION Goal: RH STG MANAGE BLADDER WITH ASSISTANCE Description: STG Manage Bladder With mod I Assistance Outcome: Progressing   Problem: RH SKIN INTEGRITY Goal: RH STG MAINTAIN SKIN INTEGRITY WITH ASSISTANCE Description: STG Maintain Skin Integrity With mod I Assistance. Outcome: Progressing   Problem: RH PAIN MANAGEMENT Goal: RH STG PAIN MANAGED AT OR BELOW PT'S PAIN GOAL Description: Less than 3 out of 10 Outcome: Progressing   Problem: RH KNOWLEDGE DEFICIT Goal: RH STG INCREASE KNOWLEDGE OF HYPERTENSION Description: Patient will identify 2 blood pressure medications she is taking and indication Outcome: Progressing Goal: RH STG INCREASE KNOWLEGDE OF HYPERLIPIDEMIA Description: Patient will identify which medication on for HLD and 1 complimentary intervention Outcome: Progressing Goal: RH STG INCREASE KNOWLEDGE OF STROKE PROPHYLAXIS Outcome: Progressing   Problem: RH Vision Goal: RH LTG Vision (Specify) Outcome: Progressing   Problem: RH Pre-functional/Other (Specify) Goal: RH LTG Pre-functional (Specify) Outcome: Progressing Goal: RH LTG Interdisciplinary (Specify) 1 Description: RH LTG Interdisciplinary (Specify)1 Outcome: Progressing Goal: RH LTG Interdisciplinary (Specify) 2 Description: RH LTG Interdisciplinary (Specify) 2  Outcome: Progressing   

## 2019-07-05 NOTE — Evaluation (Addendum)
Physical Therapy Assessment and Plan  Patient Details  Name: Courtney Chan MRN: [id number] Date of Birth: June 10, 1930  PT Diagnosis: Abnormal posture, Abnormality of gait, Difficulty walking, Muscle weakness and Pain in chest Rehab Potential: Excellent ELOS: ~7-10 days   Today's Date: 07/05/2019 PT Individual Time: 4098-1191 and 1405-1500 PT Individual Time Calculation (min): 69 min  And 55 min  Problem List:  Patient Active Problem List   Diagnosis Date Noted  . 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 Debility 07/04/2019  . CHF (congestive heart failure) ([place])   . Atrial fibrillation ([place])   . Acute cerebral infarction ([place])   . Non-ST elevation (NSTEMI) myocardial infarction ([place])   . Non-ST elevation MI (NSTEMI) ([place]) 06/28/2019  . Cardiac arrest due to underlying cardiac condition ([place]) 06/28/2019  . Cardiac arrest ([place]) 06/28/2019  . Ventilator dependent ([place])   . PAD (peripheral artery disease) ([place]) 01/11/2019  . Acute cystitis without hematuria 12/31/2018  . Diarrhea 12/31/2018  . CKD (chronic kidney disease) stage 3, GFR 30-59 ml/min 05/27/2018  . History of TIA (transient ischemic attack) 05/27/2018  . Persistent atrial fibrillation ([place]) 08/25/2017  . Osteopenia of multiple sites 11/09/2016  . DNR (do not resuscitate) 10/31/2016  . Encounter for general adult medical examination without abnormal findings 05/02/2016  . Vaccine counseling 11/01/2015  . Borderline diabetes mellitus 11/03/2013  . Pure hypercholesterolemia 11/03/2013  . Atrial fibrillation with RVR ([place]) 09/14/2013  . Acquired hypothyroidism 09/14/2013  . Diverticulosis 09/14/2013  . Essential hypertension 09/14/2013  . GERD without esophagitis 09/14/2013  . Mitral valve prolapse 09/14/2013  . H/O cardiac catheterization 08/12/2008    Past Medical History:  Past Medical History:  Diagnosis Date  . Atrial fibrillation ([place]) 2011  . Hyperlipidemia   . Hypertension   . Stroke  ([place])   . Thyroid disease    Past Surgical History:  Past Surgical History:  Procedure Laterality Date  . ABDOMINAL HYSTERECTOMY    . BREAST BIOPSY Left 1980   Negative  . CORONARY/GRAFT ACUTE MI REVASCULARIZATION N/A 06/28/2019   Procedure: Coronary/Graft Acute MI Revascularization;  Surgeon: Leonie Man, MD;  Location: [place] [facility];  Service: Cardiovascular;  Laterality: N/A;  . LEFT HEART CATH AND CORONARY ANGIOGRAPHY N/A 06/28/2019   Procedure: LEFT HEART CATH AND CORONARY ANGIOGRAPHY;  Surgeon: Leonie Man, MD;  Location: [place] [facility];  Service: Cardiovascular;  Laterality: N/A;    Assessment & Plan Clinical Impression: Patient is a 86 y.o. year old right-handed female with history of atrial fibrillation followed by Dr.Paraschos at [facility] clinic , hyperlipidemia, hypertension, CKD with creatinine 1.05-1.18 CAD with PCI to RCA and circumflex 2010.  History taken from chart review, son, and patient due to cognitive slowing.  Patient lives with spouse.  Independent prior to admission. 1 level home.  Patient very active prior to admission.  She presented on 06/28/2019 with chest pain to [facility].  She was in the process of waiting in the waiting room when she became unresponsive and pulseless.  Patient appeared to be in ventricular tachycardia.  She did undergo defibrillation x1 with additional 3 to 4 minutes of CPR.  She was found to have NSTEMI with PCI completed per cardiology services.  On 06/29/2019 (Addendum: corrected year to 2021) after extubation patient had altered mental status and was not following commands.  MRI brain ordered, personally reviewed, small acute infarct.  Per report,small acute infarct involving the posterior aspect of the left superior frontal gyrus.  CT  angiogram of head and neck with no emergent vascular findings.  Echocardiogram with ejection fraction of 60% without emboli.  EEG negative for seizures.  Neurology follow-up maintained on Plavix as  well as Pradaxa.  Tolerating a regular diet.  Therapy evaluations completed with recommendations of physical medicine rehab consult.  Patient was admitted for a comprehensive rehab program.  Please see preadmission assessment from earlier today as well.  Patient transferred to CIR on 07/03/82 .   Patient currently requires min with mobility secondary to muscle weakness, decreased cardiorespiratoy endurance, unbalanced muscle activation and decreased standing balance, decreased postural control and decreased balance strategies.  Prior to hospitalization, patient was independent  with mobility and lived with Spouse, Family in a House home.  Home access is 2-3Stairs to enter.  Patient will benefit from skilled PT intervention to maximize safe functional mobility, minimize fall risk and decrease caregiver burden for planned discharge home with 24 hour supervision.  Anticipate patient will benefit from follow up [place] at discharge.  PT - End of Session Activity Tolerance: Tolerates 30+ min activity with multiple rests Endurance Deficit: Yes Endurance Deficit Description: requires frequent seated rest breaks PT Assessment Rehab Potential (ACUTE/IP ONLY): Excellent PT Barriers to Discharge: Home environment access/layout PT Patient demonstrates impairments in the following area(s): Balance;Endurance;Motor;Nutrition;Pain;Skin Integrity PT Transfers Functional Problem(s): Bed Mobility;Bed to Chair;Car;Furniture;Floor PT Locomotion Functional Problem(s): Ambulation;Stairs PT Plan PT Intensity: Minimum of 1-2 x/day ,45 to 90 minutes PT Frequency: 5 out of 7 days PT Duration Estimated Length of Stay: ~7-10 days PT Treatment/Interventions: Ambulation/gait training;Community reintegration;DME/adaptive equipment instruction;Neuromuscular re-education;Psychosocial support;Stair training;UE/LE Strength taining/ROM;Balance/vestibular training;Discharge planning;Functional electrical stimulation;Pain  management;Therapeutic Activities;UE/LE Coordination activities;Cognitive remediation/compensation;Disease management/prevention;Functional mobility training;Patient/family education;Splinting/orthotics;Therapeutic Exercise PT Transfers Anticipated Outcome(s): mod-I PT Locomotion Anticipated Outcome(s): supervision PT Recommendation Recommendations for Other Services: Therapeutic Recreation consult Therapeutic Recreation Interventions: Clinical cytogeneticist;Outing/community reintergration Follow Up Recommendations: Home health PT;24 hour supervision/assistance Patient destination: Home Equipment Recommended: To be determined  Skilled Therapeutic Intervention Session 1: Evaluation completed (see details above and below) with education on PT POC and goals and individual treatment initiated with focus on transfers, gait training, stair navigation, activity tolerance, standing balance, and education regarding daily therapy schedule, weekly team meetings, purpose of PT evaluation, and other CIR information. Pt received sitting in recliner with her husband present and pt agreeable to therapy session. Pt received and maintained on RA throughout session. Requests to use bathroom. Sit<>stands, no AD, with min assist for lifting/balance throughout session - cuing to cross arms over her chest when coming to stance for pain management. Gait ~53f x2 to/from bathroom, no AD, with min assist for balance. Standing with min assist for balance performed LB clothing management without assist. Sit<>stand on/off toilet using grab bar support with min assist. Continent of bladder and performed peri-care without assist. Standing hand hygiene at sink with min assist for balance.  Transported to/from gym in w/c. SpO2 94% and HR 83bpm Gait training 79fx2 (seated break between), no AD, with min assist for balance - demonstrates symmetrical gait pattern with no significant swing or stance phase gait mechanics  impairments, but overall slow gait speed and slightly wider BOS. SpO2 92% and HR 92bpm.  Ascended/descended 8 steps using B HRs (as pt's family planning to put handrails on her steps) with min assist for balance - demonstrating reciprocal pattern on ascent and step-to pattern on descent. SpO2 93% and HR 104bpm. Transported to ortho gym. Ambulatory simulated car transfer (sedan height) no AD with min assist for balance while walking  and when stepping into the car. Ambulated ~73f up/down ramp, ~142fx2 over mulch, and up/down curb step with pt self selecting use of handrails as needed throughout activity with min assist for balance. [facility] demonstrates increased fall risk as noted by score of  18/56. (<36= high risk for falls, close to 100%; 37-45 significant >80%; 46-51 moderate >50%; 52-55 lower >25%). Educated pt on results of test and implications on fall risk. Transported back to room in w/c. Stand pivot w/c>recliner, no AD, with min assist for balance. Pt left seated in recliner with needs in reach, Kpad applied to chest for pain management, chair alarm on, and her husband present.   Session 2: Pt received asleep in recliner with her husband present and easily awakens to verbal stimulus. Pt agreeable to therapy session. Sit<>stands, no AD, with min assist for balance throughout session - cuing again to keep arms close to side or across chest for pain management. Reports need to use bathroom. Gait ~1561f2 in/out of bathroom, no AD, with min assist for balance. Sit<>stand on/off toilet, minimal to no UE support this time, with min assist for balance. Standing with min assist for balance performed LB clothing management without assist - unable to void at this time. Standing hand hygiene at sink with min assist for balance.  Transported to/from gym in w/c. Gait training 140f68fo AD, with min assist for balance and again pt demonstrating wider BOS but otherwise no  significant gait impairments noted. Dynamic gait in // bars of side stepping and backwards walking, no UE support, with min assist for balance. Static standing balance of narrow BOS progressed to semi-tandem 2x30 seconds each with CGA for steadying progressed to min assist when LOB occurred.  Dynamic standing balance tasks of: - alternate B LE foot taps on 2 cones with min assist for balance due to intermittent L lateral LOB 2 sets to pt fatigue - lateral side stepping over hockey stick with min assist progressed to CGA - forward/backwards stepping over hockey stick with min assist progressed to CGA - 4 square stepping over hockey sticks with min assist for balance and pt demonstrating increased difficulty stepping backwards with L LE  Participated in TUG, no AD, timing 16.74seconds. Transported back to room in w/c. Stand pivot w/c>recliner, no AD, with min assist for balance. Pt left seated in recliner with needs in reach, chair alarm on, pt using Kpad to chest for pain management, and her husband present.   PT Evaluation Precautions/Restrictions Precautions Precautions: Fall Precaution Comments: chest pain from compressions Restrictions Weight Bearing Restrictions: No Pain Pain Assessment Pain Scale: 0-10 Pain Score: 9  Pain Type: Acute pain Pain Location: Chest(due to chest compressoins) Pain Orientation: Mid Pain Descriptors / Indicators: Sore Pain Onset: On-going Pain Intervention(s): Medication (See eMAR);Relaxation;Rest;Other (Comment)(uses a pillow for support) Home Living/Prior Functioning Home Living Available Help at Discharge: Family;Available 24 hours/day Type of Home: House Home Access: Stairs to enter EntrCenterPoint EnergySteps: 2-3 Entrance Stairs-Rails: None(on back door - family planning to put on handrails) Home Layout: One level  Lives With: Spouse;Family Prior Function Level of Independence: Independent with homemaking with ambulation;Independent with  gait;Independent with transfers  Able to Take Stairs?: Yes Driving: Yes Comments: pt has been very active and walks ~ 2 miles every morning with no device. she enjoys gardening and her and her husband both take care of the yard and cooking/cleaning. she is independent for all ADL's. Perception  Perception Perception: Within  Functional Limits Praxis Praxis: Intact  Cognition Arousal/Alertness: Awake/alert Orientation Level: Oriented X4 Attention: Focused;Sustained Awareness: Appears intact Safety/Judgment: Appears intact Sensation Sensation Light Touch: Appears Intact Hot/Cold: Not tested Proprioception: Appears Intact Stereognosis: Not tested Coordination Gross Motor Movements are Fluid and Coordinated: No Fine Motor Movements are Fluid and Coordinated: Yes Coordination and Movement Description: Guarded due to rib pain Motor  Motor Motor: Other (comment);Abnormal postural alignment and control Motor - Skilled Clinical Observations: Generalized weakness  Mobility Bed Mobility Bed Mobility: Supine to Sit;Sit to Supine Supine to Sit: Minimal Assistance - Patient > 75% Sit to Supine: Minimal Assistance - Patient > 75% Transfers Transfers: Sit to Stand;Stand to Sit;Stand Pivot Transfers Sit to Stand: Minimal Assistance - Patient > 75% Stand to Sit: Minimal Assistance - Patient > 75% Stand Pivot Transfers: Minimal Assistance - Patient > 75% Stand Pivot Transfer Details: Verbal cues for technique;Verbal cues for sequencing;Verbal cues for precautions/safety Transfer (Assistive device): None Locomotion  Gait Ambulation: Yes Gait Assistance: Minimal Assistance - Patient > 75% Gait Distance (Feet): 70 Feet Assistive device: None Gait Assistance Details: Verbal cues for gait pattern;Verbal cues for technique;Verbal cues for sequencing Gait Gait: Yes Gait Pattern: Step-through pattern;Wide base of support Gait velocity: decreased Stairs / Additional Locomotion Stairs:  Yes Stairs Assistance: Minimal Assistance - Patient > 75% Stair Management Technique: Two rails Number of Stairs: 8 Height of Stairs: 6 Ramp: Minimal Assistance - Patient >75% Curb: Minimal Assistance - Patient >75% Wheelchair Mobility Wheelchair Mobility: No  Trunk/Postural Assessment  Cervical Assessment Cervical Assessment: Exceptions to WFL(forward head) Thoracic Assessment Thoracic Assessment: Exceptions to WFL(thoracic kyphosis with rounded shoulders) Lumbar Assessment Lumbar Assessment: Exceptions to WFL(posterior pelvic tilt in sitting) Postural Control Postural Control: Deficits on evaluation Righting Reactions: delayed and inadequate Postural Limitations: uses wide BOS to improve standing balance  Balance Balance Balance Assessed: Yes Standardized Balance Assessment Standardized Balance Assessment: Berg Balance Test Berg Balance Test Sit to Stand: Needs minimal aid to stand or to stabilize Standing Unsupported: Able to stand 2 minutes with supervision Sitting with Back Unsupported but Feet Supported on Floor or Stool: Able to sit safely and securely 2 minutes Stand to Sit: Controls descent by using hands Transfers: Needs one person to assist Standing Unsupported with Eyes Closed: Able to stand 10 seconds with supervision Standing Ubsupported with Feet Together: Needs help to attain position and unable to hold for 15 seconds From Standing, Reach Forward with Outstretched Arm: Can reach forward >5 cm safely (2") From Standing Position, Pick up Object from Floor: Unable to try/needs assist to keep balance From Standing Position, Turn to Look Behind Over each Shoulder: Needs supervision when turning(only turns a little) Turn 360 Degrees: Needs assistance while turning Standing Unsupported, Alternately Place Feet on Step/Stool: Needs assistance to keep from falling or unable to try Standing Unsupported, One Foot in Front: Loses balance while stepping or standing Standing  on One Leg: Unable to try or needs assist to prevent fall Total Score: 18 Static Sitting Balance Static Sitting - Balance Support: Feet supported Static Sitting - Level of Assistance: 5: Stand by assistance Dynamic Sitting Balance Dynamic Sitting - Balance Support: Feet supported Dynamic Sitting - Level of Assistance: 5: Stand by assistance Static Standing Balance Static Standing - Balance Support: During functional activity Static Standing - Level of Assistance: 4: Min assist Dynamic Standing Balance Dynamic Standing - Balance Support: During functional activity Dynamic Standing - Level of Assistance: 4: Min assist;3: Mod assist Extremity Assessment      RLE Assessment  RLE Assessment: Within Functional Limits Active Range of Motion (AROM) Comments: WNL General Strength Comments: Grossly 4+/5 assessed in sitting LLE Assessment LLE Assessment: Within Functional Limits Active Range of Motion (AROM) Comments: WNL General Strength Comments: Grossly 4+/5 throughout    Refer to Care Plan for Long Term Goals  Recommendations for other services: Therapeutic Recreation  Kitchen group, Stress management and Outing/community reintegration  Discharge Criteria: Patient will be discharged from PT if patient refuses treatment 3 consecutive times without medical reason, if treatment goals not met, if there is a change in medical status, if patient makes no progress towards goals or if patient is discharged from hospital.  The above assessment, treatment plan, treatment alternatives and goals were discussed and mutually agreed upon: by patient and by family  Tawana Scale, PT, DPT 07/05/2019, 7:57 AM

## 2019-07-05 NOTE — Progress Notes (Signed)
[place] PHYSICAL MEDICINE & REHABILITATION PROGRESS NOTE  Subjective/Complaints: Patient seen sitting up in bed this morning.  She states she slept well overnight.  She states she is ready begin therapies today.  ROS: Denies CP, shortness of breath, nausea, vomiting, diarrhea.  Objective: Vital Signs: Blood pressure 132/88, pulse 78, temperature (!) 97.4 F (36.3 C), temperature source Oral, resp. rate 18, height 5' 3.5" (1.613 m), weight 68 kg, SpO2 96 %. No results found. Recent Labs    07/03/19 0435  WBC 9.0  HGB 11.1*  HCT 35.3*  PLT 359   Recent Labs    07/03/19 0435  NA 143  K 4.2  CL 108  CO2 27  GLUCOSE 116*  BUN 20  CREATININE 0.86  CALCIUM 8.7*    Physical Exam: BP 132/88 (BP Location: Right Arm)   Pulse 78   Temp (!) 97.4 F (36.3 C) (Oral)   Resp 18   Ht 5' 3.5" (1.613 m)   Wt 68 kg   SpO2 96%   BMI 26.14 kg/m  Constitutional: No distress . Vital signs reviewed. HENT: Normocephalic.  Atraumatic. Eyes: EOMI. No discharge. Cardiovascular: No JVD. Respiratory: Normal effort.  No stridor.  + [place] GI: Non-distended. Skin: Warm and dry.  Intact. Psych: Normal mood.  Normal behavior. Musc: No edema in extremities.  No tenderness in extremities. Neurological: Alert and oriented, except for date of month. Patient is alert in no acute distress Makes good eye contact with examiner.   Fair awareness of deficits. Motor: 4+/5 throughout  Assessment/Plan: 1. Functional deficits secondary to debility which require 3+ hours per day of interdisciplinary therapy in a comprehensive inpatient rehab setting.  Physiatrist is providing close team supervision and 24 hour management of active medical problems listed below.  Physiatrist and rehab team continue to assess barriers to discharge/monitor patient progress toward functional and medical goals  Care Tool:  Bathing    Body parts bathed by patient: Right arm, Left arm, Chest, Abdomen, Front perineal  area, Buttocks, Right upper leg, Left upper leg, Left lower leg, Right lower leg, Face         Bathing assist Assist Level: Minimal Assistance - Patient > 75%     Upper Body Dressing/Undressing Upper body dressing   What is the patient wearing?: Pull over shirt    Upper body assist Assist Level: Supervision/Verbal cueing    Lower Body Dressing/Undressing Lower body dressing      What is the patient wearing?: Pants, Underwear/pull up     Lower body assist Assist for lower body dressing: Minimal Assistance - Patient > 75%     Toileting Toileting Toileting Activity did not occur (Clothing management and hygiene only): N/A (no void or bm)  Toileting assist Assist for toileting: Minimal Assistance - Patient > 75%     Transfers Chair/bed transfer  Transfers assist     Chair/bed transfer assist level: Minimal Assistance - Patient > 75%     Locomotion Ambulation   Ambulation assist              Walk 10 feet activity   Assist           Walk 50 feet activity   Assist           Walk 150 feet activity   Assist           Walk 10 feet on uneven surface  activity   Assist           Wheelchair  Assist               Wheelchair 50 feet with 2 turns activity    Assist            Wheelchair 150 feet activity     Assist            Medical Problem List and Plan: 1.  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 fib  Begin CIR evaluations   Supplemental o: Symptomatic xygen dependent-wean as tolerated 2.  Antithrombotics: -DVT/anticoagulation: Pradaxa 150 mg every 12 hours             -antiplatelet therapy: Plavix 75 mg daily 3. Pain Management: Lidoderm patch, Ultram as needed 4. Mood: Provide emotional support             -antipsychotic agents: N/A 5. Neuropsych: This  patient is capable of making decisions on her own behalf. 6. Skin/Wound Care: Routine skin checks 7. Fluids/Electrolytes/Nutrition: Routine in and outs.  CMP ordered for tomorrow. 8.  Atrial fibrillation.  Continue Pradaxa.  Cardiac rate controlled.  Continue Toprol 100 mg daily             Monitor with increased exertion 9.  Diastolic congestive heart failure.  Lasix 20 mg daily as well as Cozaar 25 mg daily.  Monitor for any signs of fluid overload Filed Weights   07/05/19 0144  Weight: 68 kg  10.  Hypothyroidism.    Continue Synthroid 11.  Hyperlipidemia Lipitor 12.  CKD.  Creatinine baseline 1.05-1.18.               CMP ordered for tomorrow  LOS: 1 days A FACE TO FACE EVALUATION WAS PERFORMED  Courtney Chan Courtney Chan 07/05/2019, 12:38 PM

## 2019-07-05 NOTE — Evaluation (Signed)
Occupational Therapy Assessment and Plan  Patient Details  Name: Courtney Chan MRN: [id number] Date of Birth: 1930-08-30  OT Diagnosis: abnormal posture, cognitive deficits and muscle weakness (generalized) Rehab Potential: Rehab Potential (ACUTE ONLY): Excellent ELOS: 7-10 days   Today's Date: 07/05/2019 OT Individual Time: 0900-1000 OT Individual Time Calculation (min): 60 min     Problem List:  Patient Active Problem List   Diagnosis Date Noted  . 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 Debility 07/04/2019  . CHF (congestive heart failure) ([place])   . Atrial fibrillation ([place])   . Acute cerebral infarction ([place])   . Non-ST elevation (NSTEMI) myocardial infarction ([place])   . Non-ST elevation MI (NSTEMI) ([place]) 06/28/2019  . Cardiac arrest due to underlying cardiac condition ([place]) 06/28/2019  . Cardiac arrest ([place]) 06/28/2019  . Ventilator dependent ([place])   . PAD (peripheral artery disease) ([place]) 01/11/2019  . Acute cystitis without hematuria 12/31/2018  . Diarrhea 12/31/2018  . CKD (chronic kidney disease) stage 3, GFR 30-59 ml/min 05/27/2018  . History of TIA (transient ischemic attack) 05/27/2018  . Persistent atrial fibrillation ([place]) 08/25/2017  . Osteopenia of multiple sites 11/09/2016  . DNR (do not resuscitate) 10/31/2016  . Encounter for general adult medical examination without abnormal findings 05/02/2016  . Vaccine counseling 11/01/2015  . Borderline diabetes mellitus 11/03/2013  . Pure hypercholesterolemia 11/03/2013  . Atrial fibrillation with RVR ([place]) 09/14/2013  . Acquired hypothyroidism 09/14/2013  . Diverticulosis 09/14/2013  . Essential hypertension 09/14/2013  . GERD without esophagitis 09/14/2013  . Mitral valve prolapse 09/14/2013  . H/O cardiac catheterization 08/12/2008    Past Medical History:  Past Medical History:  Diagnosis Date  . Atrial fibrillation ([place]) 2011  . Hyperlipidemia   . Hypertension   . Stroke ([place])   .  Thyroid disease    Past Surgical History:  Past Surgical History:  Procedure Laterality Date  . ABDOMINAL HYSTERECTOMY    . BREAST BIOPSY Left 1980   Negative  . CORONARY/GRAFT ACUTE MI REVASCULARIZATION N/A 06/28/2019   Procedure: Coronary/Graft Acute MI Revascularization;  Surgeon: Leonie Man, MD;  Location: [facility];  Service: Cardiovascular;  Laterality: N/A;  . LEFT HEART CATH AND CORONARY ANGIOGRAPHY N/A 06/28/2019   Procedure: LEFT HEART CATH AND CORONARY ANGIOGRAPHY;  Surgeon: Leonie Man, MD;  Location: [facility];  Service: Cardiovascular;  Laterality: N/A;    Assessment & Plan Clinical Impression:   Courtney Chan is an 86 year old right-handed female with history of atrial fibrillation followed by Dr.Paraschos at [facility] , hyperlipidemia, hypertension, CKD with creatinine 1.05-1.18 CAD with PCI to RCA and circumflex 2010.  History taken from chart review, son, and patient due to cognitive slowing.  Patient lives with spouse.  Independent prior to admission.  1 level home.  Patient very active prior to admission.  She presented on 06/28/2019 with chest pain to [facility].  She was in the process of waiting in the waiting room when she became unresponsive and pulseless.  Patient appeared to be in ventricular tachycardia.  She did undergo defibrillation x1 with additional 3 to 4 minutes of CPR.  She was found to have NSTEMI with PCI completed per cardiology services.  On 06/28/2018 after extubation patient had altered mental status and was not following commands.  MRI brain ordered, personally reviewed, small acute infarct.  Per report,small acute infarct involving the posterior aspect of the left superior frontal gyrus.  CT angiogram of head and neck with no  emergent vascular findings.  Echocardiogram with ejection fraction of 60% without emboli.  EEG negative for seizures.  Neurology follow-up maintained on Plavix as well as Pradaxa.  Tolerating a regular  diet.  Therapy evaluations completed with recommendations of physical medicine rehab consult.  Patient was admitted for a comprehensive rehab program.  Please see preadmission assessment from earlier today as well.  Patient currently requires min with basic self-care skills secondary to muscle weakness, decreased cardiorespiratoy endurance, decreased initiation, decreased attention, decreased awareness, decreased problem solving and decreased memory and decreased standing balance and decreased balance strategies.  Prior to hospitalization, patient could complete BADLs with independent .  Patient will benefit from skilled intervention to increase independence with basic self-care skills prior to discharge home with spouse.  Anticipate patient will require 24 hour supervision and follow up home health.  OT - End of Session Endurance Deficit: Yes OT Assessment Rehab Potential (ACUTE ONLY): Excellent OT Barriers to Discharge: Medical stability OT Patient demonstrates impairments in the following area(s): Balance;Safety;Endurance;Motor OT Basic ADL's Functional Problem(s): Grooming;Bathing;Dressing;Toileting OT Advanced ADL's Functional Problem(s): Simple Meal Preparation;Laundry OT Transfers Functional Problem(s): Toilet;Tub/Shower OT Additional Impairment(s): None OT Plan OT Intensity: Minimum of 1-2 x/day, 45 to 90 minutes OT Frequency: 5 out of 7 days OT Duration/Estimated Length of Stay: 7-10 days OT Treatment/Interventions: Balance/vestibular training;Discharge planning;Pain management;Self Care/advanced ADL retraining;Therapeutic Activities;UE/LE Coordination activities;Disease mangement/prevention;Functional mobility training;Patient/family education;Therapeutic Exercise;Wheelchair propulsion/positioning;UE/LE Strength taining/ROM;Psychosocial support;DME/adaptive equipment instruction;Community reintegration OT Self Feeding Anticipated Outcome(s): No goal OT Basic Self-Care Anticipated  Outcome(s): Supervision OT Toileting Anticipated Outcome(s): Supervision OT Bathroom Transfers Anticipated Outcome(s): Supervision OT Recommendation Patient destination: Home Follow Up Recommendations: Home health OT Equipment Recommended: To be determined   Skilled Therapeutic Intervention Skilled OT session completed with focus on initial evaluation, education on OT role/POC, and establishment of patient-centered goals.   Pt greeted in bed with c/o 8/10 rib pain from CPR compressions. Pt premedicated for pain, agreeable to engage in bathing/dressing tasks EOB sit<stand without device. Supervision for reaching EOB and scooting forward for neutral alignment. She needed vcs to get started and questioning cues to make sure she washed all body areas. Min A for dynamic standing balance during pericare and LB dressing. Supervision assist for dynamic sitting involving using figure 4 functionally to meet demands of tasks. She then ambulated to the sink without device and Min A. She completed makeup application, oral care (+flossing teeth), and grooming tasks while standing with min guard. Simulated ambulatory transfer to toilet completed with Min A as well. Pt returned to the recliner and was left with all needs within reach and chair alarm set, in care of NT to set her up for online church service.   Throughout session, 02 sats on RA remained between 91-96%. She needed multiple rest breaks due to rib pain, holding both k-pad and pillow to chest. Educated pt to press pillow firmly to chest when coughing or sneezing for pain relief.   OT Evaluation Precautions/Restrictions  Precautions Precautions: Fall Precaution Comments: chest pain from compressions Restrictions Weight Bearing Restrictions: No Pain Pain Assessment Pain Scale: 0-10 Pain Score: 9  Pain Type: Acute pain Pain Location: Chest(due to chest compressoins) Pain Orientation: Mid Pain Descriptors / Indicators: Sore Pain Onset:  On-going Pain Intervention(s): Medication (See eMAR);Relaxation;Rest;Other (Comment)(uses a pillow for support) Home Living/Prior [place] expects to be discharged to:: Private residence Living Arrangements: Spouse/significant other Available Help at Discharge: Family, Available 24 hours/day Type of Home: House Home Access: Stairs to enter [employer] of  Steps: 2-3 Entrance Stairs-Rails: None(on back door - family planning to put on handrails) Home Layout: One level Bathroom Shower/Tub: Optometrist: Yes  Lives With: Spouse, Family IADL History Homemaking Responsibilities: Yes(Pt shared cooking and cleaning responsibilities with her spouse PTA, also tended her flower garden) Occupation: Retired Type of Occupation: Radio producer Leisure and Hobbies: Flower gardening, driving to her hair appointment Prior Function Level of Independence: Independent with homemaking with ambulation, Independent with gait, Independent with transfers, Independent with basic ADLs  Able to Take Stairs?: Yes Driving: Yes Comments: pt has been very active and walks ~ 2 miles every morning with no device. she enjoys gardening and her and her husband both take care of the yard and cooking/cleaning. she is independent for all ADL's. ADL ADL Eating: Not assessed Grooming: Contact guard Where Assessed-Grooming: Standing at sink Upper Body Bathing: Supervision/safety Where Assessed-Upper Body Bathing: Edge of bed Lower Body Bathing: Minimal assistance Where Assessed-Lower Body Bathing: Edge of bed Upper Body Dressing: Supervision/safety Where Assessed-Upper Body Dressing: Edge of bed Lower Body Dressing: Minimal assistance Toileting: Not assessed Where Assessed-Toileting: Glass blower/designer: Minimal Print production planner Method: Ambulating(no AD) Science writer: Energy manager:  Not assessed Vision Baseline Vision/History: Wears glasses;Cataracts Wears Glasses: At all times Patient Visual Report: No change from baseline Perception  Perception: Within Functional Limits Praxis Praxis: Intact Cognition Arousal/Alertness: Awake/alert Orientation Level: Person;Place;Situation Person: Oriented Place: Oriented Situation: Oriented Year: 2021 Month: February Day of Week: Correct Immediate Memory Recall: Sock;Blue;Bed Memory Recall Sock: With Cue Memory Recall Blue: Without Cue Memory Recall Bed: Without Cue Attention: Focused;Sustained Awareness: Appears intact Problem Solving: Impaired(needed vcs for functional problem solving during self care) Safety/Judgment: Appears intact Sensation Sensation Light Touch: Appears Intact Hot/Cold: Not tested Proprioception: Appears Intact Stereognosis: Not tested Coordination Gross Motor Movements are Fluid and Coordinated: No Fine Motor Movements are Fluid and Coordinated: Yes Coordination and Movement Description: Guarded due to rib pain Finger Nose Finger Test: WNL bilaterally Motor  Motor Motor: Other (comment) Motor - Skilled Clinical Observations: Generalized weakness Mobility    Min A ambulatory toilet transfer without device  Trunk/Postural Assessment  Cervical Assessment Cervical Assessment: Exceptions to WFL(forward head) Thoracic Assessment Thoracic Assessment: Exceptions to WFL(rounded shoulders) Lumbar Assessment Lumbar Assessment: Exceptions to WFL(posterior pelvic tilt) Postural Control Postural Control: Within Functional Limits  Balance Balance Balance Assessed: Yes Standardized Balance Assessment Standardized Balance Assessment: Berg Balance Test Berg Balance Test Sit to Stand: Needs minimal aid to stand or to stabilize Standing Unsupported: Able to stand 2 minutes with supervision Sitting with Back Unsupported but Feet Supported on Floor or Stool: Able to sit safely and securely 2  minutes Stand to Sit: Controls descent by using hands Transfers: Needs one person to assist Standing Unsupported with Eyes Closed: Able to stand 10 seconds with supervision Standing Ubsupported with Feet Together: Needs help to attain position and unable to hold for 15 seconds From Standing, Reach Forward with Outstretched Arm: Can reach forward >5 cm safely (2") From Standing Position, Pick up Object from Floor: Unable to try/needs assist to keep balance From Standing Position, Turn to Look Behind Over each Shoulder: Needs supervision when turning(only turns a little) Turn 360 Degrees: Needs assistance while turning Standing Unsupported, Alternately Place Feet on Step/Stool: Needs assistance to keep from falling or unable to try Standing Unsupported, One Foot in Front: Loses balance while stepping or standing Standing on One Leg: Unable to try or needs assist to prevent fall Total Score:  18 Dynamic Sitting Balance Dynamic Sitting - Level of Assistance: 5: Stand by assistance(washing her feet EOB) Dynamic Standing Balance Dynamic Standing - Level of Assistance: 4: Min assist(Using both UEs to elevate pants over hips) Extremity/Trunk Assessment RUE Assessment RUE Assessment: Within Functional Limits Active Range of Motion (AROM) Comments: WNL General Strength Comments: No MMT performed due to pain LUE Assessment LUE Assessment: Within Functional Limits Active Range of Motion (AROM) Comments: WNL General Strength Comments: No MMT performed due to pain   Refer to Care Plan for Long Term Goals  Recommendations for other services: None    Discharge Criteria: Patient will be discharged from OT if patient refuses treatment 3 consecutive times without medical reason, if treatment goals not met, if there is a change in medical status, if patient makes no progress towards goals or if patient is discharged from hospital.  The above assessment, treatment plan, treatment alternatives and goals  were discussed and mutually agreed upon: by patient  Skeet Simmer 07/05/2019, 12:35 PM

## 2019-07-06 ENCOUNTER — Inpatient Hospital Stay (HOSPITAL_COMMUNITY): Payer: Medicare PPO | Admitting: Occupational Therapy

## 2019-07-06 ENCOUNTER — Inpatient Hospital Stay (HOSPITAL_COMMUNITY): Payer: Medicare PPO | Admitting: Physical Therapy

## 2019-07-06 LAB — COMPREHENSIVE METABOLIC PANEL
ALT: 25 U/L (ref 0–44)
AST: 27 U/L (ref 15–41)
Albumin: 2.7 g/dL — ABNORMAL LOW (ref 3.5–5.0)
Alkaline Phosphatase: 99 U/L (ref 38–126)
Anion gap: 13 (ref 5–15)
BUN: 20 mg/dL (ref 8–23)
CO2: 24 mmol/L (ref 22–32)
Calcium: 9 mg/dL (ref 8.9–10.3)
Chloride: 102 mmol/L (ref 98–111)
Creatinine, Ser: 0.94 mg/dL (ref 0.44–1.00)
GFR calc Af Amer: >60 mL/min (ref >60)
GFR calc non Af Amer: 55 mL/min — ABNORMAL LOW (ref >60)
Glucose, Bld: 100 mg/dL — ABNORMAL HIGH (ref 70–99)
Potassium: 4.1 mmol/L (ref 3.5–5.1)
Sodium: 139 mmol/L (ref 135–145)
Total Bilirubin: 1.2 mg/dL (ref 0.3–1.2)
Total Protein: 5.6 g/dL — ABNORMAL LOW (ref 6.5–8.1)

## 2019-07-06 LAB — CBC WITH DIFFERENTIAL/PLATELET
Abs Immature Granulocytes: 0.23 10*3/uL — ABNORMAL HIGH (ref 0.00–0.07)
Basophils Absolute: 0 10*3/uL (ref 0.0–0.1)
Basophils Relative: 0 %
Eosinophils Absolute: 0.2 10*3/uL (ref 0.0–0.5)
Eosinophils Relative: 2 %
HCT: 36.6 % (ref 36.0–46.0)
Hemoglobin: 11.6 g/dL — ABNORMAL LOW (ref 12.0–15.0)
Immature Granulocytes: 2 %
Lymphocytes Relative: 16 %
Lymphs Abs: 1.7 10*3/uL (ref 0.7–4.0)
MCH: 30.1 pg (ref 26.0–34.0)
MCHC: 31.7 g/dL (ref 30.0–36.0)
MCV: 95.1 fL (ref 80.0–100.0)
Monocytes Absolute: 1.1 10*3/uL — ABNORMAL HIGH (ref 0.1–1.0)
Monocytes Relative: 10 %
Neutro Abs: 7.5 10*3/uL (ref 1.7–7.7)
Neutrophils Relative %: 70 %
Platelets: 364 10*3/uL (ref 150–400)
RBC: 3.85 MIL/uL — ABNORMAL LOW (ref 3.87–5.11)
RDW: 14.4 % (ref 11.5–15.5)
WBC: 10.8 10*3/uL — ABNORMAL HIGH (ref 4.0–10.5)
nRBC: 0 % (ref 0.0–0.2)

## 2019-07-06 MED ORDER — PRO-STAT SUGAR FREE PO LIQD
30.0000 mL | Freq: Two times a day (BID) | ORAL | Status: DC
  Administered 2019-07-06 – 2019-07-12 (×13): 30 mL via ORAL
  Filled 2019-07-06 (×13): qty 30

## 2019-07-06 MED ORDER — MAGIC MOUTHWASH W/LIDOCAINE
5.0000 mL | Freq: Three times a day (TID) | ORAL | Status: DC
  Administered 2019-07-06 – 2019-07-10 (×11): 5 mL via ORAL
  Filled 2019-07-06 (×12): qty 5

## 2019-07-06 MED ORDER — DM-GUAIFENESIN ER 30-600 MG PO TB12
1.0000 | ORAL_TABLET | Freq: Two times a day (BID) | ORAL | Status: DC
  Administered 2019-07-06 – 2019-07-12 (×13): 1 via ORAL
  Filled 2019-07-06 (×13): qty 1

## 2019-07-06 MED ORDER — PHENOL 1.4 % MT LIQD
1.0000 | OROMUCOSAL | Status: DC | PRN
  Administered 2019-07-06: 1 via OROMUCOSAL
  Filled 2019-07-06: qty 177

## 2019-07-06 NOTE — Progress Notes (Signed)
Physical Medicine and Rehabilitation Consult Reason for Consult: Altered mental status Referring Physician: Dr. Ellyn Hack     HPI: Courtney Chan is a 86 y.o. right-handed female with history of atrial fibrillation, hyperlipidemia, hypertension, CAD with PCI to the RCA and circumflex 2010.  Per chart review patient lives with spouse.  Independent prior to admission.  1 level home.  Patient very active prior to admission.  Presented on 06/28/2019 with persistent chest discomfort and was brought to the emergency room at [facility].  She was in the process of waiting in the waiting room when she became unresponsive and pulseless.  Patient appeared to be in ventricular tachycardia.  She did undergo defibrillation x1 with additional 3 to 4 minutes of CPR.  She was found to have non-STEMI with PCI completed per cardiology services.  On 06/29/2019 after extubation patient with altered mental status not following commands.  MRI and imaging showed small acute infarct involving the posterior aspect of the left superior frontal gyrus.  CT angiogram of head and neck with no emergent vascular findings.  Echocardiogram with ejection fraction of 60% without emboli.  EEG negative for seizure.  Neurology follow-up patient currently is maintained on Plavix as well as Pradaxa.  Tolerating a regular diet.  Therapy evaluations completed with recommendations of physical medicine rehab consult. Discussed with patient's nurse, working on diuresis today, has periwick because of frequent urination.  Generally has good bladder control   Review of Systems  Constitutional: Negative for fever.  HENT: Negative for hearing loss.   Eyes: Negative for blurred vision and double vision.  Respiratory: Negative for cough and shortness of breath.   Cardiovascular: Positive for palpitations and leg swelling. Negative for chest pain.  Gastrointestinal: Positive for constipation. Negative for heartburn and vomiting.  Genitourinary:  Negative for dysuria, flank pain and hematuria.  Musculoskeletal: Positive for myalgias.  Skin: Negative for rash.  All other systems reviewed and are negative.          Past Medical History:  Diagnosis Date  . Atrial fibrillation ([place]) 2011  . Hyperlipidemia    . Hypertension    . Stroke ([place])    . Thyroid disease               Past Surgical History:  Procedure Laterality Date  . ABDOMINAL HYSTERECTOMY      . BREAST BIOPSY Left 1980    Negative  . CORONARY/GRAFT ACUTE MI REVASCULARIZATION N/A 06/28/2019    Procedure: Coronary/Graft Acute MI Revascularization;  Surgeon: Leonie Man, MD;  Location: [facility];  Service: Cardiovascular;  Laterality: N/A;  . LEFT HEART CATH AND CORONARY ANGIOGRAPHY N/A 06/28/2019    Procedure: LEFT HEART CATH AND CORONARY ANGIOGRAPHY;  Surgeon: Leonie Man, MD;  Location: [facility];  Service: Cardiovascular;  Laterality: N/A;             Family History  Problem Relation Age of Onset  . Breast cancer Maternal Grandmother          58's    Social History:  reports that she has never smoked. She has never used smokeless tobacco. She reports that she does not drink alcohol or use drugs. Allergies:           Allergies  Allergen Reactions  . Metronidazole Diarrhea and Nausea And Vomiting  . Oxycodone Other (See Comments)      Altered mental status  Medications Prior to Admission  Medication Sig Dispense Refill  . levothyroxine (SYNTHROID, LEVOTHROID) 88 MCG tablet Take 88 mcg by mouth daily.           Home: Home Living Family/patient expects to be discharged to:: Private residence Living Arrangements: Spouse/significant other Available Help at Discharge: Family(pt's husband available, her daughter Oley Balm live close by as well, one son lives in [place]) Type of Home: House Home Access: Level entry Home Layout: One level Bathroom Accessibility: Yes Home Equipment: Environmental consultant - 2 wheels, Cane - single  point  Functional History: Prior Function Level of Independence: Independent Comments: pt has been very active and walks ~ 2 miles every morning with no device. she enjoys gardening and her and her husband both take care of the yard and cooking/cleaning. she is independent for all ADL's. Functional Status:  Mobility: Bed Mobility Overal bed mobility: Needs Assistance Bed Mobility: Supine to Sit Supine to sit: Min assist, HOB elevated General bed mobility comments: assist to elevate trunk into sitting  Transfers Overall transfer level: Needs assistance Equipment used: Rolling walker (2 wheeled), 1 person hand held assist Transfers: Stand Pivot Transfers, Sit to/from Stand Sit to Stand: Min assist Stand pivot transfers: Mod assist General transfer comment: assist to power up into standing and for balance; cues for safety and for use of AD Ambulation/Gait Ambulation/Gait assistance: Mod assist Gait Distance (Feet): 25 Feet Assistive device: Rolling walker (2 wheeled) Gait Pattern/deviations: Decreased stride length, Step-through pattern, Staggering left, Staggering right, Trunk flexed General Gait Details: assistance required for balance and managing RW; pt with multiple lateral LOB requiring assist to recover Gait velocity: decreased   ADL:   Cognition: Cognition Overall Cognitive Status: Impaired/Different from baseline Orientation Level: Oriented X4 Cognition Arousal/Alertness: Awake/alert Behavior During Therapy: Impulsive Overall Cognitive Status: Impaired/Different from baseline Area of Impairment: Memory, Safety/judgement, Problem solving, Following commands Orientation Level: (slight impairments but pt able to correct them with cues) Memory: Decreased short-term memory Following Commands: Follows one step commands inconsistently Safety/Judgement: Decreased awareness of deficits, Decreased awareness of safety Problem Solving: Requires verbal cues, Difficulty  sequencing General Comments: pt is impulsive to stand and with near fall attempting transfer BSC to EOB   Blood pressure 119/86, pulse 86, temperature 97.6 F (36.4 C), temperature source Oral, resp. rate 20, height 5\' 4"  (1.626 m), weight 66.9 kg, SpO2 93 %. Physical Exam  Neurological:  Patient is alert sitting up in bed no acute distress oriented x3 and follows commands.      General: No acute distress Mood and affect are appropriate Heart: Regular rate and rhythm no rubs murmurs or extra sounds Lungs: Decreased breath sounds at bases clear to auscultation, breathing unlabored, no rales or wheezes Abdomen: Positive bowel sounds, soft nontender to palpation, nondistended Extremities: No clubbing, cyanosis, or edema, no calf tenderness to palpation negative Homans Skin: No evidence of breakdown, no evidence of rash Neurologic: Cranial nerves II through XII intact, motor strength is 4/5 in bilateral deltoid, bicep, tricep, grip, hip flexor, knee extensors, ankle dorsiflexor and plantar flexor Sensory exam normal sensation to light touch  in bilateral upper and lower extremities Cerebellar exam normal finger to nose to finger as well as heel to shin in bilateral upper extremities Musculoskeletal: Full range of motion in all 4 extremities.  Multiple joint deformities both hands DIPs and PIPs Lab Results Last 24 Hours           Results for orders placed or performed during the hospital encounter of 06/28/19 (from the  past 24 hour(s))  CBC     Status: Abnormal    Collection Time: 07/02/19 12:21 AM  Result Value Ref Range    WBC 10.6 (H) 4.0 - 10.5 K/uL    RBC 3.79 (L) 3.87 - 5.11 MIL/uL    Hemoglobin 11.5 (L) 12.0 - 15.0 g/dL    HCT 35.0 09.3 - 81.8 %    MCV 95.3 80.0 - 100.0 fL    MCH 30.3 26.0 - 34.0 pg    MCHC 31.9 30.0 - 36.0 g/dL    RDW 29.9 37.1 - 69.6 %    Platelets 344 150 - 400 K/uL    nRBC 0.0 0.0 - 0.2 %  Basic metabolic panel     Status: Abnormal    Collection Time:  07/02/19 12:21 AM  Result Value Ref Range    Sodium 140 135 - 145 mmol/L    Potassium 3.1 (L) 3.5 - 5.1 mmol/L    Chloride 100 98 - 111 mmol/L    CO2 27 22 - 32 mmol/L    Glucose, Bld 149 (H) 70 - 99 mg/dL    BUN 21 8 - 23 mg/dL    Creatinine, Ser 7.89 (H) 0.44 - 1.00 mg/dL    Calcium 8.9 8.9 - 38.1 mg/dL    GFR calc non Af Amer 43 (L) >60 mL/min    GFR calc Af Amer 50 (L) >60 mL/min    Anion gap 13 5 - 15        Imaging Results (Last 48 hours)  MR BRAIN WO CONTRAST   Result Date: 06/30/2019 CLINICAL DATA:  Stroke follow-up EXAM: MRI HEAD WITHOUT CONTRAST TECHNIQUE: Multiplanar, multiecho pulse sequences of the brain and surrounding structures were obtained without intravenous contrast. COMPARISON:  Head CT June 28, 2019 FINDINGS: Brain: A focus of restricted diffusion is seen in the posterior aspect of the left superior frontal gyrus, consistent with an acute infarct. Area of encephalomalacia and gliosis is seen involving the posterior right insula extending into the right temporal lobe. Scattered and confluent foci of T2 hyperintensity are seen within the white matter of the cerebral hemispheres, nonspecific, most likely related to chronic small vessel ischemia. Prominence of the supratentorial ventricles, cerebral and cerebellar sulci reflecting parenchymal volume loss. No hemorrhage, extra-axial collection or mass lesion. Vascular: Normal flow voids. Skull and upper cervical spine: Normal marrow signal. Sinuses/Orbits: Bilateral lens surgery noted. Visualized sinuses are clear. Other: None. IMPRESSION: 1. Small acute infarct involving the posterior aspect of the left superior frontal gyrus. 2. Encephalomalacia and gliosis involving the posterior right insula extending into the right temporal lobe. 3. Chronic small vessel ischemia and parenchymal volume loss. Electronically Signed   By: Baldemar Lenis M.D.   On: 06/30/2019 13:32    DG Chest Port 1 View   Result Date:  07/01/2019 CLINICAL DATA:  Shortness of breath.  CHF. EXAM: PORTABLE CHEST 1 VIEW COMPARISON:  06/30/2019 FINDINGS: Normal heart size. Probable small bilateral pleural effusions. No pneumothorax. Extremely low lung volumes. Perihilar and basilar predominant interstitial and airspace disease is felt to be slightly improved. IMPRESSION: Minimal improvement in aeration. Interstitial and airspace disease, at least partially felt to represent interstitial edema and atelectasis. Infection at the lung bases cannot be excluded. Electronically Signed   By: Jeronimo Greaves M.D.   On: 07/01/2019 09:42         Assessment/Plan: Diagnosis: Left superior frontal infarct and debility following V. tach arrest 1. Does the need for close, 24 hr/day medical  supervision in concert with the patient's rehab needs make it unreasonable for this patient to be served in a less intensive setting? Yes 2. Co-Morbidities requiring supervision/potential complications: Congestive heart failure-diastolic, atrial fibrillation, history of coronary artery disease status post stenting 3. Due to bladder management, bowel management, safety, skin/wound care, disease management, medication administration, pain management and patient education, does the patient require 24 hr/day rehab nursing? Yes 4. Does the patient require coordinated care of a physician, rehab nurse, therapy disciplines of PT, OT to address physical and functional deficits in the context of the above medical diagnosis(es)? Yes Addressing deficits in the following areas: balance, endurance, locomotion, strength, transferring, bowel/bladder control, bathing, dressing, feeding, toileting and psychosocial support 5. Can the patient actively participate in an intensive therapy program of at least 3 hrs of therapy per day at least 5 days per week? Yes 6. The potential for patient to make measurable gains while on inpatient rehab is good 7. Anticipated functional outcomes upon  discharge from inpatient rehab are modified independent and supervision  with PT, modified independent and supervision with OT, n/a with SLP. 8. Estimated rehab length of stay to reach the above functional goals is: 10 to 14 days 9. Anticipated discharge destination: Home 10. Overall Rehab/Functional Prognosis: good   RECOMMENDATIONS: This patient's condition is appropriate for continued rehabilitative care in the following setting: CIR Patient has agreed to participate in recommended program. Yes Note that insurance prior authorization may be required for reimbursement for recommended care.   Comment: Needs additional diuresis before she is ready for more aggressive rehabilitation.  Should be ready tomorrow     Charlton Amor, PA-C 07/02/2019    "I have personally performed a face to face diagnostic evaluation of this patient.  Additionally, I have reviewed and concur with the physician assistant's documentation above." Erick Colace M.D. [facility] FAAPM&R (Neuromuscular Med) Diplomate Am Board of Electrodiagnostic Med Fellow Am Board of Interventional Pain          Revision History                                     Routing History                        Revision History

## 2019-07-06 NOTE — Progress Notes (Signed)
Occupational Therapy Session Note  Patient Details  Name: Courtney Chan MRN: [id number] Date of Birth: January 01, 1931  Today's Date: 07/06/2019 OT Individual Time: 1405-1500 OT Individual Time Calculation (min): 55 min    Short Term Goals: Week 1:  OT Short Term Goal 1 (Week 1): STGs=LTGs due to ELOS  Skilled Therapeutic Interventions/Progress Updates:    Treatment session with focus on functional transfers, dynamic standing balance, and endurance.  Pt received supine in bed reporting fatigue, but agreeable to therapy session.  Discussed at length with pt and her husband about home bathroom setup and recommendation for DME.  Pt and pt's husband agreeable to [facility] ordering a shower seat for home.  Pt has 3 in 1 already at home and elevated toilet seats.  Engaged in walk-in shower transfers with stepping over simulated 3" shower ledge with CGA fade to supervision with blocked practice.  Engaged in functional transfers in ADL apt with CGA during ambulation and sit > stand from lower surfaces.  Ambulated 13' without AD with CGA.  Engaged in Box and Blocks assessment for coordination, pt completed 44 blocks on Rt and 43 blocks on Lt.  Completed pipe tree puzzle in standing with focus on endurance as pt reports she cans green beans in the summer in standing.  Required mod cues for sequencing and problem solving during pipe tree puzzle, but able to complete it without any physical assist.  Returned to room and transferred back to bed stand pivot with CGA.  Therapy Documentation Precautions:  Precautions Precautions: Fall Precaution Comments: chest pain from compressions Restrictions Weight Bearing Restrictions: No General:   Vital Signs: Therapy Vitals Temp: 98.2 F (36.8 C) Pulse Rate: 95 Resp: 16 BP: 103/60 Patient Position (if appropriate): Sitting Oxygen Therapy SpO2: 95 % O2 Device: Room Air Pain:  Pt with c/o pain in chest with sit <> stand, utilized pillow for comfort at  chest.   Therapy/Group: Individual Therapy  Rosalio Loud 07/06/2019, 3:17 PM

## 2019-07-06 NOTE — Progress Notes (Signed)
Social Work Assessment and Plan   Patient Details  Name: Courtney Chan MRN: [id number] Date of Birth: 02-Apr-1931  Today's Date: 07/06/2019  Problem List:  Patient Active Problem List   Diagnosis Date Noted  . Hypothyroidism   . Chronic kidney disease   . Supplemental oxygen dependent   . 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 Debility 07/04/2019  . CHF (congestive heart failure) ([place])   . Atrial fibrillation ([place])   . Acute cerebral infarction ([place])   . Non-ST elevation (NSTEMI) myocardial infarction ([place])   . Non-ST elevation MI (NSTEMI) ([place]) 06/28/2019  . Cardiac arrest due to underlying cardiac condition ([place]) 06/28/2019  . Cardiac arrest ([place]) 06/28/2019  . Ventilator dependent ([place])   . PAD (peripheral artery disease) ([place]) 01/11/2019  . Acute cystitis without hematuria 12/31/2018  . Diarrhea 12/31/2018  . CKD (chronic kidney disease) stage 3, GFR 30-59 ml/min 05/27/2018  . History of TIA (transient ischemic attack) 05/27/2018  . Persistent atrial fibrillation ([place]) 08/25/2017  . Osteopenia of multiple sites 11/09/2016  . DNR (do not resuscitate) 10/31/2016  . Encounter for general adult medical examination without abnormal findings 05/02/2016  . Vaccine counseling 11/01/2015  . Borderline diabetes mellitus 11/03/2013  . Pure hypercholesterolemia 11/03/2013  . Atrial fibrillation with RVR ([place]) 09/14/2013  . Acquired hypothyroidism 09/14/2013  . Diverticulosis 09/14/2013  . Essential hypertension 09/14/2013  . GERD without esophagitis 09/14/2013  . Mitral valve prolapse 09/14/2013  . H/O cardiac catheterization 08/12/2008   Past Medical History:  Past Medical History:  Diagnosis Date  . Atrial fibrillation ([place]) 2011  . Hyperlipidemia   . Hypertension   . Stroke ([place])   . Thyroid disease    Past Surgical History:  Past Surgical History:  Procedure Laterality Date  . ABDOMINAL HYSTERECTOMY    . BREAST BIOPSY Left 1980   Negative  .  CORONARY/GRAFT ACUTE MI REVASCULARIZATION N/A 06/28/2019   Procedure: Coronary/Graft Acute MI Revascularization;  Surgeon: Leonie Man, MD;  Location: [facility];  Service: Cardiovascular;  Laterality: N/A;  . LEFT HEART CATH AND CORONARY ANGIOGRAPHY N/A 06/28/2019   Procedure: LEFT HEART CATH AND CORONARY ANGIOGRAPHY;  Surgeon: Leonie Man, MD;  Location: [facility];  Service: Cardiovascular;  Laterality: N/A;   Social History:  reports that she has never smoked. She has never used smokeless tobacco. She reports that she does not drink alcohol or use drugs.  Family / Support Systems Marital Status: Married How Long?: 46 uears Patient Roles: Spouse, Parent Spouse/Significant Other: Cindia Hustead (husband): [phone] Children: 3 adult children. 2 children live locally; 1 child in [place]. Other Supports: Pt reports children and grandchildren will assist PRN. Anticipated Caregiver: Primary caregiver to be pt husband Ability/Limitations of Caregiver: Pt husband has difficulty hearing Caregiver Availability: 24/7 Family Dynamics: Pt and husband live together. They have support from children PRN. Pt also reports support from friends PRN as well.  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 Education: 4 yr college- education Read: Yes Write: Yes Employment Status: Retired Public relations account executive Issues: Denies Guardian/Conservator: N/A   Abuse/Neglect Abuse/Neglect Assessment Can Be Completed: Yes Physical Abuse: Denies Verbal Abuse: Denies Sexual Abuse: Denies Exploitation of patient/patient's resources: Denies Self-Neglect: Denies  Emotional Status Pt's affect, behavior and adjustment status: Pt in good spirits. Pt is looking forward  to getting better. Recent Psychosocial Issues:  Denies Psychiatric History: Denies Substance Abuse History: Denies  Patient / Family Perceptions, Expectations & Goals Pt/Family understanding of illness & functional limitations: Pt and pt husband have a general understanding of pt care needs Premorbid pt/family roles/activities: Pt and husband participated in managing finances, medications, grocery shopping etc. Anticipated changes in roles/activities/participation: Pt husband and children will provide more assistance with managing house needs. Pt/family expectations/goals: Pt gals is to continue to work on balance and coordination.  Community Resources Express Scripts: None Premorbid Home Care/DME Agencies: None Transportation available at discharge: Husband to transport home  Discharge Planning Living Arrangements: Spouse/significant other Support Systems: Spouse/significant other, Children, Friends/neighbors Type of Residence: Private residence Insurance underwriter Resources: Multimedia programmer (specify)([employer] Medicare) Financial Resources: SSI Financial Screen Referred: No Living Expenses: Own Money Management: Patient, Spouse Does the patient have any problems obtaining your medications?: No Home Management: Both managed house needs. Patient/Family Preliminary Plans: Anticipate pt husband and children to run errands, perform house cleaning, and transport to/from medical appointments. Social Work Anticipated Follow Up Needs: [place] Additional Notes/Comments: D/c to home Expected length of stay: 7-10 days  Clinical Impression SW met with pt in room to introduce self, explain role, and discuss discharge process. Pt intends to have support 24/7 support from husband, and PRN assistance from children. Pt states she and husband have 54, no HCPOA on file. DME: RW and walking stick.   Loralee Pacas, MSW, [place] Office: [phone] Cell: [phone] Fax: [phone] 07/06/2019, 4:43 PM

## 2019-07-06 NOTE — Care Management (Signed)
Inpatient Rehabilitation Center Individual Statement of Services  Patient Name:  Courtney Chan  Date:  07/06/2019  Welcome to the Inpatient Rehabilitation Center.  Our goal is to provide you with an individualized program based on your diagnosis and situation, designed to meet your specific needs.  With this comprehensive rehabilitation program, you will be expected to participate in at least 3 hours of rehabilitation therapies Monday-Friday, with modified therapy programming on the weekends.  Your rehabilitation program will include the following services:  Physical Therapy (PT), Occupational Therapy (OT), Speech Therapy (ST), 24 hour per day rehabilitation nursing, Therapeutic Recreaction (TR), Psychology, Neuropsychology, Case Management (Social Worker), Rehabilitation Medicine, Nutrition Services, Pharmacy Services and Other  Weekly team conferences will be held on Tuesdays  to discuss your progress.  Your Social Worker will talk with you frequently to get your input and to update you on team discussions.  Team conferences with you and your family in attendance may also be held.  Expected length of stay: 7-10 days   Overall anticipated outcome: Supervision  Depending on your progress and recovery, your program may change. Your Social Worker will coordinate services and will keep you informed of any changes. Your Social Worker's name and contact numbers are listed  below.  The following services may also be recommended but are not provided by the Inpatient Rehabilitation Center:   Driving Evaluations  Home Health Rehabiltiation Services  Outpatient Rehabilitation Services  Vocational Rehabilitation   Arrangements will be made to provide these services after discharge if needed.  Arrangements include referral to agencies that provide these services.  Your insurance has been verified to be:  [employer]  Your primary doctor is:  Marisue Ivan  Pertinent information will  be shared with your doctor and your insurance company.  Social Worker:  Cecile Sheerer, LCSWA  Information discussed with and copy given to patient by: Gretchen Short, 07/06/2019, 10:23 AM

## 2019-07-06 NOTE — Progress Notes (Signed)
Physical Therapy Session Note  Patient Details  Name: Courtney Chan MRN: [id number] Date of Birth: 1930-07-26  Today's Date: 07/06/2019 PT Individual Time: 8527-7824 PT Individual Time Calculation (min): 57 min   Short Term Goals: Week 1:  PT Short Term Goal 1 (Week 1): = to LTGs based on ELOS  Skilled Therapeutic Interventions/Progress Updates:  Pt received in bed & agreeable to tx. Discussed home set up with pt reporting her son is installing rails at her steps & pt reporting 1 fall ~3 weeks ago PTA.   Supine<>sit with supervision, extra time 2/2 chest soreness, HOB slightly elevated, & bed rails with cuing to use them. Sit<>stand with close supervision from EOB & low toilet. Pt with continent void & BM on toilet, performing peri hygiene without assistance. Hand hygiene at sink with close supervision. Gait in room, bathroom & room<>dayroom without AD & min assist with pt requiring initial cuing not to hold to sink, door, etc.  Kinetron in sitting then standing with BUE support & min assist with focus on BLE strengthening, weight shifting & balance. 10x sit<>stand x 2 sets without BUE support with CGA<>min assist with focus on BLE strengthening. Retrograde gait x 30 ft x 2 without AD & min assist with focus on dynamic balance with cuing for increased step length.   Pt on room air throughout session & SpO2 intermittently checked & >90%. Rest breaks provided PRN 2/2 fatigue.  Pt left in bed with alarm set, call bell & all needs in reach.   Pt reports she's been coughing up blood - MD made aware.  Therapy Documentation Precautions:  Precautions Precautions: Fall Precaution Comments: chest pain from compressions Restrictions Weight Bearing Restrictions: No  Pain: 8/10 chest pain 2/2 compressions - using k pad at beginning & end of session. Instructed pt on hugging pillow for pain management during coughs with fair return demo. Nurse applied pain patch to chest at beginning of  session.   Therapy/Group: Individual Therapy  Sandi Mariscal 07/06/2019, 12:15 PM

## 2019-07-06 NOTE — Progress Notes (Signed)
Occupational Therapy Session Note  Patient Details  Name: Courtney Chan MRN: [id number] Date of Birth: February 15, 1931  Today's Date: 07/06/2019 OT Individual Time: [phone] OT Individual Time Calculation (min): 73 min    Short Term Goals: Week 1:  OT Short Term Goal 1 (Week 1): STGs=LTGs due to ELOS  Skilled Therapeutic Interventions/Progress Updates:    Upon entering the room, pt supine in bed with K pad over chest with c/o pain with movement. Pt is agreeable to OT intervention and performs supine >sit with min guard. Pt seated on EOB and coughs several times with report of increased pain and pt coughing up blood. MD notified and assessed pt during this session. Pt engaged in bathing and dressing tasks with sit <>stand at sink with min guard for balance. Pt performed UB self care with set up a to obtain all needed items. Pt asking therapist several times to assist her with self care tasks and OT educated pt on need to complete herself if able and she reports, " Oh, I don't know why I asked you to do that then. I can do it". Pt ambulating with min hand held assistance 100' to day room and taking seated rest break before returning back to room. Pt reports feeling very fatigued and requesting to return to bed with supervision for sit >supine. K pad applied again per pt request. Bed alarm activated and call bell within reach upon exiting the room.   Therapy Documentation Precautions:  Precautions Precautions: Fall Precaution Comments: chest pain from compressions Restrictions Weight Bearing Restrictions: No ADL: ADL Eating: Not assessed Grooming: Contact guard Where Assessed-Grooming: Standing at sink Upper Body Bathing: Supervision/safety Where Assessed-Upper Body Bathing: Edge of bed Lower Body Bathing: Minimal assistance Where Assessed-Lower Body Bathing: Edge of bed Upper Body Dressing: Supervision/safety Where Assessed-Upper Body Dressing: Edge of bed Lower Body Dressing: Minimal  assistance Toileting: Not assessed Where Assessed-Toileting: Teacher, adult education: Minimal Dentist Method: Ambulating(no AD) Acupuncturist: Chiropractor Transfer: Not assessed   Therapy/Group: Individual Therapy  Alen Bleacher 07/06/2019, 12:45 PM

## 2019-07-06 NOTE — Progress Notes (Signed)
[facility] PHYSICAL MEDICINE & REHABILITATION PROGRESS NOTE  Subjective/Complaints: Up with OT. Coughing up blood tinged phlegm. Has been doing it since being in hospital. Chest and throat are sore from repeated coughing. Otherwise doing fairly well  ROS: Patient denies fever, rash, sore throat, blurred vision, nausea, vomiting, diarrhea,  shortness of breath or chest pain, joint or back pain, headache, or mood change.    Objective: Vital Signs: Blood pressure 114/61, pulse 74, temperature (!) 97.4 F (36.3 C), temperature source Oral, resp. rate 18, height 5' 3.5" (1.613 m), weight 68 kg, SpO2 97 %. No results found. Recent Labs    07/06/19 0554  WBC 10.8*  HGB 11.6*  HCT 36.6  PLT 364   Recent Labs    07/06/19 0554  NA 139  K 4.1  CL 102  CO2 24  GLUCOSE 100*  BUN 20  CREATININE 0.94  CALCIUM 9.0    Physical Exam: BP 114/61 (BP Location: Left Arm)   Pulse 74   Temp (!) 97.4 F (36.3 C) (Oral)   Resp 18   Ht 5' 3.5" (1.613 m)   Wt 68 kg   SpO2 97%   BMI 26.14 kg/m  Constitutional: No distress . Vital signs reviewed. HEENT: EOMI, oral membranes moist Neck: supple. Good phonation Cardiovascular: RRR without murmur. No JVD    Respiratory: CTA Bilaterally without wheezes or rales. Normal effort    GI: BS +, non-tender, non-distended  Skin: Warm and dry.  Intact. Psych: Normal mood.  Normal behavior. Musc: No edema in extremities.  No tenderness in extremities. Neurological: alert and oriented to name, place, mo, year, why Strength 4 to 4+/5.sensation intact. Fair standing balance.   Assessment/Plan: 1. Functional deficits secondary to debility which require 3+ hours per day of interdisciplinary therapy in a comprehensive inpatient rehab setting.  Physiatrist is providing close team supervision and 24 hour management of active medical problems listed below.  Physiatrist and rehab team continue to assess barriers to discharge/monitor patient progress toward  functional and medical goals  Care Tool:  Bathing    Body parts bathed by patient: Right arm, Left arm, Chest, Abdomen, Front perineal area, Buttocks, Right upper leg, Left upper leg, Left lower leg, Right lower leg, Face         Bathing assist Assist Level: Minimal Assistance - Patient > 75%     Upper Body Dressing/Undressing Upper body dressing   What is the patient wearing?: Pull over shirt    Upper body assist Assist Level: Supervision/Verbal cueing    Lower Body Dressing/Undressing Lower body dressing      What is the patient wearing?: Pants, Underwear/pull up     Lower body assist Assist for lower body dressing: Minimal Assistance - Patient > 75%     Toileting Toileting Toileting Activity did not occur (Clothing management and hygiene only): N/A (no void or bm)  Toileting assist Assist for toileting: Minimal Assistance - Patient > 75%     Transfers Chair/bed transfer  Transfers assist     Chair/bed transfer assist level: Contact Guard/Touching assist     Locomotion Ambulation   Ambulation assist      Assist level: Minimal Assistance - Patient > 75% Assistive device: Other (comment) Max distance: 1107ft   Walk 10 feet activity   Assist     Assist level: Minimal Assistance - Patient > 75% Assistive device: No Device   Walk 50 feet activity   Assist    Assist level: Minimal Assistance - Patient >  75% Assistive device: No Device    Walk 150 feet activity   Assist Walk 150 feet activity did not occur: Safety/medical concerns         Walk 10 feet on uneven surface  activity   Assist     Assist level: Minimal Assistance - Patient > 75% Assistive device: Other (comment)(railing as needed)   Wheelchair     Assist Will patient use wheelchair at discharge?: No(anticipate pt will be a functional ambulator)             Wheelchair 50 feet with 2 turns activity    Assist            Wheelchair 150 feet  activity     Assist            Medical Problem List and Plan: 1.  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 fib  -Continue CIR therapies including PT, OT, and SLP  2.  Antithrombotics: -DVT/anticoagulation: Pradaxa 150 mg every 12 hours             -antiplatelet therapy: Plavix 75 mg daily 3. Pain Management: Lidoderm patch, Ultram as needed 4. Mood: Provide emotional support              -antipsychotic agents: N/A 5. Neuropsych: This patient is capable of making decisions on her own behalf. 6. Skin/Wound Care: Routine skin checks 7. Fluids/Electrolytes/Nutrition: encourage po   I personally reviewed the patient's labs today.   -protein supp for low albumin  8.  Atrial fibrillation.  Continue Pradaxa.   Continue Toprol 100 mg daily             Monitor with increased exertion  -HR controlled in general. Some increase with exertion 9.  Diastolic congestive heart failure.  Lasix 20 mg daily as well as Cozaar 25 mg daily.  need daily weights Filed Weights   07/05/19 0144  Weight: 68 kg  10.  Hypothyroidism.    Continue Synthroid 11.  Hyperlipidemia Lipitor 12.  CKD.  Creatinine baseline 1.05-1.18.               2/22 Cr 0.94 13. Blood tinged sputum:  -reassured patient  -HGB up to 11.6 2/22  -likely d/t repeated coughing, chest also sore from CPR  -added mucinex dm, MMW w/ lidocaine and chlorseptic spray   LOS: 2 days A FACE TO FACE EVALUATION WAS PERFORMED  Ranelle Oyster 07/06/2019, 10:40 AM

## 2019-07-06 NOTE — Progress Notes (Signed)
Patient information reviewed and entered into [facility] by Becky Andriea Hasegawa, PPS coordinator. Information including medical coding, function ability, and quality indicators will be reviewed and updated through discharge.   

## 2019-07-07 ENCOUNTER — Inpatient Hospital Stay (HOSPITAL_COMMUNITY): Payer: Medicare PPO

## 2019-07-07 ENCOUNTER — Inpatient Hospital Stay (HOSPITAL_COMMUNITY): Payer: Medicare PPO | Admitting: Occupational Therapy

## 2019-07-07 NOTE — Discharge Instructions (Signed)
Inpatient Rehab Discharge Instructions  Courtney Chan Discharge date and time: No discharge date for patient encounter.   Activities/Precautions/ Functional Status: Activity: activity as tolerated Diet: regular diet Wound Care: none needed Functional status:  ___ No restrictions     ___ Walk up steps independently ___ 24/7 supervision/assistance   ___ Walk up steps with assistance ___ Intermittent supervision/assistance  ___ Bathe/dress independently ___ Walk with walker     _x__ Bathe/dress with assistance ___ Walk Independently    ___ Shower independently ___ Walk with assistance    ___ Shower with assistance ___ No alcohol     ___ Return to work/school ________  Special Instructions: No driving smoking or alcohol STROKE/TIA DISCHARGE INSTRUCTIONS SMOKING Cigarette smoking nearly doubles your risk of having a stroke & is the single most alterable risk factor  If you smoke or have smoked in the last 12 months, you are advised to quit smoking for your health.  Most of the excess cardiovascular risk related to smoking disappears within a year of stopping.  Ask you doctor about anti-smoking medications  [place] Quit Line: 1-800-QUIT NOW  Free Smoking Cessation Classes [phone]  CHOLESTEROL Know your levels; limit fat & cholesterol in your diet  Lipid Panel     Component Value Date/Time   CHOL 122 07/01/2019 0507   TRIG 128 07/01/2019 0507   HDL 35 (L) 07/01/2019 0507   CHOLHDL 3.5 07/01/2019 0507   VLDL 26 07/01/2019 0507   LDLCALC 61 07/01/2019 0507      Many patients benefit from treatment even if their cholesterol is at goal.  Goal: Total Cholesterol (CHOL) less than 160  Goal:  Triglycerides (TRIG) less than 150  Goal:  HDL greater than 40  Goal:  LDL (LDLCALC) less than 100   BLOOD PRESSURE American Stroke Association blood pressure target is less that 120/80 mm/Hg  Your discharge blood pressure is:  BP: 120/74  Monitor your blood pressure  Limit your  salt and alcohol intake  Many individuals will require more than one medication for high blood pressure  DIABETES (A1c is a blood sugar average for last 3 months) Goal HGBA1c is under 7% (HBGA1c is blood sugar average for last 3 months)  Diabetes: No known diagnosis of diabetes    Lab Results  Component Value Date   HGBA1C 6.1 (H) 06/28/2019     Your HGBA1c can be lowered with medications, healthy diet, and exercise.  Check your blood sugar as directed by your physician  Call your physician if you experience unexplained or low blood sugars.  PHYSICAL ACTIVITY/REHABILITATION Goal is 30 minutes at least 4 days per week  Activity: Increase activity slowly, Therapies: Physical Therapy: Home Health Return to work:   Activity decreases your risk of heart attack and stroke and makes your heart stronger.  It helps control your weight and blood pressure; helps you relax and can improve your mood.  Participate in a regular exercise program.  Talk with your doctor about the best form of exercise for you (dancing, walking, swimming, cycling).  DIET/WEIGHT Goal is to maintain a healthy weight  Your discharge diet is:  Diet Order            Diet regular Room service appropriate? Yes; Fluid consistency: Thin  Diet effective now              liquids Your height is:  Height: 5' 3.5" (161.3 cm) Your current weight is: Weight: 68 kg Your Body Mass Index (BMI) is:  BMI (Calculated): 26.14  Following the type of diet specifically designed for you will help prevent another stroke.  Your goal weight range is:    Your goal Body Mass Index (BMI) is 19-24.  Healthy food habits can help reduce 3 risk factors for stroke:  High cholesterol, hypertension, and excess weight.  RESOURCES Stroke/Support Group:  Call [phone]   STROKE EDUCATION PROVIDED/REVIEWED AND GIVEN TO PATIENT Stroke warning signs and symptoms How to activate emergency medical system (call 911). Medications prescribed at  discharge. Need for follow-up after discharge. Personal risk factors for stroke. Pneumonia vaccine given:  Flu vaccine given:  My questions have been answered, the writing is legible, and I understand these instructions.  I will adhere to these goals & educational materials that have been provided to me after my discharge from the hospital.      My questions have been answered and I understand these instructions. I will adhere to these goals and the provided educational materials after my discharge from the hospital.  Patient/Caregiver Signature _______________________________ Date __________  Clinician Signature _______________________________________ Date __________  Please bring this form and your medication list with you to all your follow-up doctor's appointments.   Information on my medicine - Pradaxa (dabigatran)  This medication education was reviewed with me or my healthcare representative as part of my discharge preparation.    Why was Pradaxa prescribed for you? Pradaxa was prescribed for you to reduce the risk of forming blood clots that cause a stroke if you have a medical condition called atrial fibrillation (a type of irregular heartbeat).    What do you Need to know about PradAXa? Take your Pradaxa TWICE DAILY - one capsule in the morning and one tablet in the evening with or without food.  It would be best to take the doses about the same time each day.  The capsules should not be broken, chewed or opened - they must be swallowed whole.  Do not store Pradaxa in other medication containers - once the bottle is opened the Pradaxa should be used within FOUR months; throw away any capsules that haven't been by that time.  Take Pradaxa exactly as prescribed by your doctor.  DO NOT stop taking Pradaxa without talking to the doctor who prescribed the medication.  Stopping without other stroke prevention medication to take the place of Pradaxa may increase your risk of  developing a clot that causes a stroke.  Refill your prescription before you run out.  After discharge, you should have regular check-up appointments with your healthcare provider that is prescribing your Pradaxa.  In the future your dose may need to be changed if your kidney function or weight changes by a significant amount.  What do you do if you miss a dose? If you miss a dose, take it as soon as you remember on the same day.  If your next dose is less than 6 hours away, skip the missed dose.  Do not take two doses of PRADAXA at the same time.  Important Safety Information A possible side effect of Pradaxa is bleeding. You should call your healthcare provider right away if you experience any of the following: ? Bleeding from an injury or your nose that does not stop. ? Unusual colored urine (red or dark brown) or unusual colored stools (red or black). ? Unusual bruising for unknown reasons. ? A serious fall or if you hit your head (even if there is no bleeding).  Some medicines may interact with Pradaxa  and might increase your risk of bleeding or clotting while on Pradaxa. To help avoid this, consult your healthcare provider or pharmacist prior to using any new prescription or non-prescription medications, including herbals, vitamins, non-steroidal anti-inflammatory drugs (NSAIDs) and supplements.  This website has more information on Pradaxa (dabigatran): [email or web address]

## 2019-07-07 NOTE — Progress Notes (Signed)
[facility] PHYSICAL MEDICINE & REHABILITATION PROGRESS NOTE  Subjective/Complaints: No new complaints today. Still coughing up some blood tinged sputum. Sternum sore--lidocaine, heating pad helps.   ROS: Patient denies fever, rash, sore throat, blurred vision, nausea, vomiting, diarrhea, cough, shortness of breath or chest pain, joint or back pain, headache, or mood change.   Objective: Vital Signs: Blood pressure (!) 139/97, pulse 81, temperature 98.2 F (36.8 C), resp. rate 18, height 5' 3.5" (1.613 m), weight 68.4 kg, SpO2 96 %. No results found. Recent Labs    07/06/19 0554  WBC 10.8*  HGB 11.6*  HCT 36.6  PLT 364   Recent Labs    07/06/19 0554  NA 139  K 4.1  CL 102  CO2 24  GLUCOSE 100*  BUN 20  CREATININE 0.94  CALCIUM 9.0    Physical Exam: BP (!) 139/97 (BP Location: Right Arm)   Pulse 81   Temp 98.2 F (36.8 C)   Resp 18   Ht 5' 3.5" (1.613 m)   Wt 68.4 kg   SpO2 96%   BMI 26.29 kg/m  Constitutional: No distress . Vital signs reviewed. HEENT: EOMI, oral membranes moist Neck: supple Cardiovascular: IRR IRR without murmur. No JVD    Respiratory: CTA Bilaterally without wheezes or rales. Normal effort    GI: BS +, non-tender, non-distended  Skin: Warm and dry.  Intact. Psych: Normal mood.  Normal behavior. Musc: No edema in extremities.  No tenderness in extremities.  Neurological: alert and oriented to name, place, mo, year, why she's here. Voice strong. No CN findings Strength 4 to 4+/5. Normal sensation  Assessment/Plan: 1. Functional deficits secondary to debility which require 3+ hours per day of interdisciplinary therapy in a comprehensive inpatient rehab setting.  Physiatrist is providing close team supervision and 24 hour management of active medical problems listed below.  Physiatrist and rehab team continue to assess barriers to discharge/monitor patient progress toward functional and medical goals  Care Tool:  Bathing    Body parts  bathed by patient: Right arm, Left arm, Chest, Abdomen, Front perineal area, Buttocks, Right upper leg, Left upper leg, Left lower leg, Right lower leg, Face         Bathing assist Assist Level: Contact Guard/Touching assist     Upper Body Dressing/Undressing Upper body dressing   What is the patient wearing?: Pull over shirt    Upper body assist Assist Level: Set up assist    Lower Body Dressing/Undressing Lower body dressing      What is the patient wearing?: Incontinence brief, Pants     Lower body assist Assist for lower body dressing: Contact Guard/Touching assist     Toileting Toileting Toileting Activity did not occur (Clothing management and hygiene only): N/A (no void or bm)  Toileting assist Assist for toileting: Minimal Assistance - Patient > 75%     Transfers Chair/bed transfer  Transfers assist     Chair/bed transfer assist level: Minimal Assistance - Patient > 75%     Locomotion Ambulation   Ambulation assist      Assist level: Minimal Assistance - Patient > 75% Assistive device: No Device Max distance: 100 ft   Walk 10 feet activity   Assist     Assist level: Minimal Assistance - Patient > 75% Assistive device: No Device   Walk 50 feet activity   Assist    Assist level: Minimal Assistance - Patient > 75% Assistive device: No Device    Walk 150 feet activity  Assist Walk 150 feet activity did not occur: Safety/medical concerns         Walk 10 feet on uneven surface  activity   Assist     Assist level: Minimal Assistance - Patient > 75% Assistive device: Other (comment)(railing as needed)   Wheelchair     Assist Will patient use wheelchair at discharge?: No(anticipate pt will be a functional ambulator)             Wheelchair 50 feet with 2 turns activity    Assist            Wheelchair 150 feet activity     Assist            Medical Problem List and Plan: 1.  Decreased  functional mobility secondary to debility with small left MCA frontal gyrus infarction, likely embolic in setting of MI, post cardiac arrest with CPR/PCI and known A. fib  -Continue CIR therapies including PT, OT, and SLP   -team conference today 2.  Antithrombotics: -DVT/anticoagulation: Pradaxa 150 mg every 12 hours             -antiplatelet therapy: Plavix 75 mg daily 3. Pain Management: Lidoderm patch, Ultram as needed 4. Mood: Provide emotional support              -antipsychotic agents: N/A 5. Neuropsych: This patient is capable of making decisions on her own behalf. 6. Skin/Wound Care: Routine skin checks 7. Fluids/Electrolytes/Nutrition: encourage po   -protein supp for low albumin  8.  Atrial fibrillation.  Continue Pradaxa.   Continue Toprol 100 mg daily             Monitor with increased exertion  -HR controlled in general.   9.  Diastolic congestive heart failure.  Lasix 20 mg daily as well as Cozaar 25 mg daily.  2/23 weights stable Filed Weights   07/05/19 0144 07/06/19 1052 07/07/19 0600  Weight: 68 kg 67.1 kg 68.4 kg  10.  Hypothyroidism.    Continue Synthroid 11.  Hyperlipidemia Lipitor 12.  CKD.  Creatinine baseline 1.05-1.18.               2/22 Cr 0.94 13. Blood tinged sputum:  -no change  -HGB up to 11.6 2/22  -likely d/t repeated coughing, pradaxa/plavix.  chest also sore from CPR  -added mucinex dm, MMW w/ lidocaine and chlorseptic spray  -lidocaine, ice/heat to sternum   LOS: 3 days A FACE TO FACE EVALUATION WAS PERFORMED  Meredith Staggers 07/07/2019, 11:59 AM

## 2019-07-07 NOTE — Progress Notes (Addendum)
Occupational Therapy Session Note  Patient Details  Name: Courtney Chan MRN: [id number] Date of Birth: 05/09/1931  Today's Date: 07/07/2019  Session 1 OT Individual Time: 1040-4591 OT Individual Time Calculation (min): 70 min   Session 2 OT Individual Time: 1402-1500 OT Individual Time Calculation (min): 58 min   Short Term Goals: Week 1:  OT Short Term Goal 1 (Week 1): STGs=LTGs due to ELOS  Skilled Therapeutic Interventions/Progress Updates:  Session 1   Pt greeted semi-reclined in bed with heating pad over chest. Pt reported pain in chest from chest compressions and stated she is still coughing up phlegm at times. Pt came to sitting EOB with min A and HOB elevated. Pt completed stand-pivot to wc with CGA. Bathing/dressing completed from wc at the sink with set-up A and CGA for balance when standing to wash buttocks. Pt reported need to go to the bathroom so ambulated into bathroom with min HHA. Pt voided bladder and completed peri-care with supervision. Pt took rest break, then ambulated to therapy gym with min HHA. Worked on standing balance and endurance standing of foam block. Pt needed CGA for balance. Incorporated graded peg board puzzle using L UE with difficulty seeing diagonal pattern requiring mod verbal cues at times to see mistakes. Pt then completed 10 sit<>stands while holding pillow on chest for pain management. Pt ambulated back to room in similar fashion and left seated in recliner with chair alarm on, call bell in reach, and needs met.   Session 2 Pt greeted seated in recliner and agreeable to OT treatment session. Pt's spouse also present. Pt reported need to go to the bathroom. Pt ambulated from wc to bathroom with CGA. Pt completed clothing management, voided bladder and completed peri-care with CGA for standing balance. Pt washed hands at the sink, then ambulated to therapy day room with min HHA. Worked on problem solving and fine motor coordination with "Craxy Eights"  card game. Had pt work on shuffling and dealing cards. Pt needed min verbal cues at times to place matching suit card down on pile. Worked on functional ambulation with obstacle course around cones with CGA for balance when turning. UB there-ex 3 sets of 10 bicep curls and seated rows. Pt ambulated back to room and left seated in recliner with chair alarm on, call bell in reach and needs met.   Therapy Documentation Precautions:  Precautions Precautions: Fall Precaution Comments: chest pain from compressions Restrictions Weight Bearing Restrictions: No Pain: Pain Assessment Pain Scale: 0-10 Pain Score: 7  Pain Type: Acute pain Pain Location: Chest Pain Descriptors / Indicators: Aching Pain Onset: On-going Pain Intervention(s): Pillow hug;Heat applied   Therapy/Group: Individual Therapy  Valma Cava 07/07/2019, 3:25 PM

## 2019-07-07 NOTE — Progress Notes (Signed)
Physical Therapy Session Note  Patient Details  Name: Courtney Chan MRN: [id number] Date of Birth: Sep 23, 1930  Today's Date: 07/07/2019 PT Individual Time: 1101-1200 PT Individual Time Calculation (min): 59 min   Short Term Goals: Week 1:  PT Short Term Goal 1 (Week 1): = to LTGs based on ELOS  Skilled Therapeutic Interventions/Progress Updates:  Functional gait on unit with focus on balance and dual task activities at CGA level overall with occasional light min assist for balance small bouts and longer distances up to 150' at a time. Rest breaks needed due to decreased endurance.  Dynamic gait for higher level balance training through obstacles, sidestepping, and toe taps to simulate home environment and functional balance. Stair negotiation for home entry and community mobility training with close supervision to CGA using rails for support. Pt reports family is building rails.  Functional furniture transfers and bed mobility retraining in ADL apartment for home environment simulation. Performed transfer with CGA and requires CGA and mod verbal cues for technique for bed mobility to decrease strain and pain through chest. Pt difficulty with following sequence PT recommended but pt verbalized understanding. Would benefit from further practice. Pt performed rocking recliner transfer x 2 during session with CGA to min assist.   NMR for balance retraining on compliant surface for ankle strategies and postural control retraining including static stance, standing with eyes closed, and narrow BOS with eyes open. Min assist needed for balance due to sway. Pt unable to self correct.  Energy conservation education with patient and patient's husband. Also discussed possible recommendation for use of RW for independence with balance to decrease fall risk as well as for energy conservation. Both patient and husband in agreement and will have grandson get her walker down from attic this week. Plan to bring  in for assessment.    Therapy Documentation Precautions:  Precautions Precautions: Fall Precaution Comments: chest pain from compressions Restrictions Weight Bearing Restrictions: No  Pain: Unrated chest pain at site of compression - ice pack applied at end of session.    Therapy/Group: Individual Therapy  Courtney Chan Darrol Poke, PT, DPT, CBIS  07/07/2019, 12:05 PM

## 2019-07-07 NOTE — Patient Care Conference (Signed)
Inpatient RehabilitationTeam Conference and Plan of Care Update Date: 07/07/2019   Time: 10:30 AM   Patient Name: Courtney Chan      Medical Record Number: [id number]  Date of Birth: [birth year] Sex: Female         Room/Bed: 4W19C/4W19C-01 Payor Info: Payor: HU[employer] MEDICARE / Plan: [employer] MEDICARE CHOICE PPO / Product Type: *No Product type* /    Admit Date/Time:  07/04/2019  1:05 PM  Primary Diagnosis:  Debility  Patient Active Problem List   Diagnosis Date Noted  . Hypothyroidism   . Chronic kidney disease   . Supplemental oxygen dependent   . 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 Debility 07/04/2019  . CHF (congestive heart failure) ([place])   . Atrial fibrillation ([place])   . Acute cerebral infarction ([place])   . Non-ST elevation (NSTEMI) myocardial infarction ([place])   . Non-ST elevation MI (NSTEMI) ([place]) 06/28/2019  . Cardiac arrest due to underlying cardiac condition ([place]) 06/28/2019  . Cardiac arrest ([place]) 06/28/2019  . Ventilator dependent ([place])   . PAD (peripheral artery disease) ([place]) 01/11/2019  . Acute cystitis without hematuria 12/31/2018  . Diarrhea 12/31/2018  . CKD (chronic kidney disease) stage 3, GFR 30-59 ml/min 05/27/2018  . History of TIA (transient ischemic attack) 05/27/2018  . Persistent atrial fibrillation ([place]) 08/25/2017  . Osteopenia of multiple sites 11/09/2016  . DNR (do not resuscitate) 10/31/2016  . Encounter for general adult medical examination without abnormal findings 05/02/2016  . Vaccine counseling 11/01/2015  . Borderline diabetes mellitus 11/03/2013  . Pure hypercholesterolemia 11/03/2013  . Atrial fibrillation with RVR ([place]) 09/14/2013  . Acquired hypothyroidism 09/14/2013  . Diverticulosis 09/14/2013  . Essential hypertension 09/14/2013  . GERD without esophagitis 09/14/2013  . Mitral valve prolapse 09/14/2013  . H/O cardiac catheterization 08/12/2008    Expected Discharge Date: Expected Discharge Date:  07/14/19  Team Members Present: Physician leading conference: Dr. Alger Simons Social Worker Present: Loralee Pacas, Stan Head) Nurse Present: Dwaine Gale, RN Case Manager: Karene Fry, RN PT Present: Lavone Nian, PT OT Present: Cherylynn Ridges, OT SLP Present: Weston Anna, SLP PPS Coordinator present : Gunnar Fusi, SLP     Current Status/Progress Goal Weekly Team Focus  Bowel/Bladder   cont b+b; last bm 2/22  cont. to maintain cont.  assist with toileting needs prn   Swallow/Nutrition/ Hydration             ADL's   CGA to min assist bathing/dressing, CGA transfers without AD  Supervision  ADL retraining, activity tolerance, endurance, transfers, pt/family education, d/c planning   Mobility   min assist gait without AD, min assist stairs with B rails, supervision bed mobility with hospital bed features, supervision<>CGA sit<>stand  supervision<>mod I overall with LRAD  balance, endurance, pain management, bed mobility, transfers, stair negotiation, gait, d/c planning   Communication             Safety/Cognition/ Behavioral Observations            Pain   no c/o pain  remain pain free  monitor for s/s of pain   Skin   x2 abrasions (back)  maintain skin integrity  monitor skin qshift    Rehab Goals Patient on target to meet rehab goals: Yes *See Care Plan and progress notes for long and short-term goals.     Barriers to Discharge  Current Status/Progress Possible Resolutions Date Resolved   Nursing  PT  Home environment access/layout                 OT Medical stability                SLP                SW                Discharge Planning/Teaching Needs:  Pt to d/c home with husband with 24/7 care  Family education as recommended by therapy   Team Discussion: Had chest pain, Vtach, cardiac arrest, small L frontal CVA, monitoring BP, monitoring volume, blood tinged cough, monitoring labs, has sternal pain.  RN cont B/B, 2 abrasions on  back.  OT CGA/HHA transfers, min/CGA ADLs, goals mod I/S.  PT CGA/min A gait min A stairs, goals S/mod I.   Revisions to Treatment Plan: N/A     Medical Summary Current Status: left frontal infarct, cardiac arrest with cpr. ongoing cough, sternal pain. Weekly Focus/Goal: improve cough and pain mgt  Barriers to Discharge: Medical stability   Possible Resolutions to Barriers: medical mgt of above   Continued Need for Acute Rehabilitation Level of Care: The patient requires daily medical management by a physician with specialized training in physical medicine and rehabilitation for the following reasons: Direction of a multidisciplinary physical rehabilitation program to maximize functional independence : Yes Medical management of patient stability for increased activity during participation in an intensive rehabilitation regime.: Yes Analysis of laboratory values and/or radiology reports with any subsequent need for medication adjustment and/or medical intervention. : Yes   I attest that I was present, lead the team conference, and concur with the assessment and plan of the team.   Lelon Frohlich M 07/07/2019, 7:41 PM   Team conference was held via web/ teleconference due to COVID - 19

## 2019-07-07 NOTE — IPOC Note (Signed)
Overall Plan of Care [facility]) Patient Details Name: Courtney Chan MRN: [id number] DOB: 12/28/1930  Admitting Diagnosis: Debility  Hospital Problems: Principal Problem:   Debility Active Problems:   Left middle cerebral artery stroke (HCC)   Hypothyroidism   Chronic kidney disease   Supplemental oxygen dependent     Functional Problem List: Nursing Endurance, Pain, Skin Integrity  PT Balance, Endurance, Motor, Nutrition, Pain, Skin Integrity  OT Balance, Safety, Endurance, Motor  SLP    TR         Basic ADL's: OT Grooming, Bathing, Dressing, Toileting     Advanced  ADL's: OT Simple Meal Preparation, Laundry     Transfers: PT Bed Mobility, Bed to Chair, Car, [employer], Civil Service fast streamer, Research scientist (life sciences): PT Ambulation, Stairs     Additional Impairments: OT None  SLP        TR      Anticipated Outcomes Item Anticipated Outcome  Self Feeding No goal  Swallowing      Basic self-care  Marketing executive Transfers Supervision  Bowel/Bladder  mod I  Transfers  mod-I  Locomotion  supervision  Communication     Cognition     Pain  less than 3 out of 10  Safety/Judgment  mod I   Therapy Plan: PT Intensity: Minimum of 1-2 x/day ,45 to 90 minutes PT Frequency: 5 out of 7 days PT Duration Estimated Length of Stay: ~7-10 days OT Intensity: Minimum of 1-2 x/day, 45 to 90 minutes OT Frequency: 5 out of 7 days OT Duration/Estimated Length of Stay: 7-10 days     Due to the current state of emergency, patients may not be receiving their 3-hours of Medicare-mandated therapy.   Team Interventions: Nursing Interventions Patient/Family Education, Pain Management, Medication Management, Skin Care/Wound Management  PT interventions Ambulation/gait training, Community reintegration, DME/adaptive equipment instruction, Neuromuscular re-education, Psychosocial support, Stair training, UE/LE Strength taining/ROM,  Warden/ranger, Discharge planning, Functional electrical stimulation, Pain management, Therapeutic Activities, UE/LE Coordination activities, Cognitive remediation/compensation, Disease management/prevention, Functional mobility training, Patient/family education, Splinting/orthotics, Therapeutic Exercise  OT Interventions Balance/vestibular training, Discharge planning, Pain management, Self Care/advanced ADL retraining, Therapeutic Activities, UE/LE Coordination activities, Disease mangement/prevention, Functional mobility training, Patient/family education, Therapeutic Exercise, Wheelchair propulsion/positioning, UE/LE Strength taining/ROM, Psychosocial support, DME/adaptive equipment instruction, Community reintegration  SLP Interventions    TR Interventions    SW/CM Interventions Discharge Planning, Psychosocial Support, Patient/Family Education   Barriers to Discharge MD  Medical stability  Nursing      PT Home environment access/layout    OT Medical stability    SLP      SW       Team Discharge Planning: Destination: PT-Home ,OT- Home , SLP-  Projected Follow-up: PT-Home health PT, 24 hour supervision/assistance, OT-  Home health OT, SLP-  Projected Equipment Needs: PT-To be determined, OT- To be determined, SLP-  Equipment Details: PT- , OT-  Patient/family involved in discharge planning: PT- Patient, Family member/caregiver,  OT-Patient, SLP-   MD ELOS: 7-10 days Medical Rehab Prognosis:  Excellent Assessment: The patient has been admitted for CIR therapies with the diagnosis of debility and left MCA infarct related to PEA/cardiac arrest. The team will be addressing functional mobility, strength, stamina, balance, safety, adaptive techniques and equipment, self-care, bowel and bladder mgt, patient and caregiver education, NMR, activity tolerance, pain control, ego support. Goals have been set at supervision to mod I with mobility and self-care tasks.   Due to the  current state of emergency, patients may not be receiving their 3 hours per day of Medicare-mandated therapy.    Meredith Staggers, MD, FAAPMR      See Team Conference Notes for weekly updates to the plan of care

## 2019-07-07 NOTE — Plan of Care (Signed)
  Problem: Consults Goal: RH STROKE PATIENT EDUCATION Description: See Patient Education module for education specifics  Outcome: Progressing   Problem: RH BOWEL ELIMINATION Goal: RH STG MANAGE BOWEL WITH ASSISTANCE Description: STG Manage Bowel with mod I Assistance. Outcome: Progressing Goal: RH STG MANAGE BOWEL W/MEDICATION W/ASSISTANCE Description: STG Manage Bowel with Medication with mod I Assistance. Outcome: Progressing   Problem: RH BLADDER ELIMINATION Goal: RH STG MANAGE BLADDER WITH ASSISTANCE Description: STG Manage Bladder With mod I Assistance Outcome: Progressing   Problem: RH SKIN INTEGRITY Goal: RH STG MAINTAIN SKIN INTEGRITY WITH ASSISTANCE Description: STG Maintain Skin Integrity With mod I Assistance. Outcome: Progressing   Problem: RH PAIN MANAGEMENT Goal: RH STG PAIN MANAGED AT OR BELOW PT'S PAIN GOAL Description: Less than 3 out of 10 Outcome: Progressing   Problem: RH KNOWLEDGE DEFICIT Goal: RH STG INCREASE KNOWLEDGE OF HYPERTENSION Description: Patient will identify 2 blood pressure medications she is taking and indication Outcome: Progressing Goal: RH STG INCREASE KNOWLEGDE OF HYPERLIPIDEMIA Description: Patient will identify which medication on for HLD and 1 complimentary intervention Outcome: Progressing Goal: RH STG INCREASE KNOWLEDGE OF STROKE PROPHYLAXIS Outcome: Progressing   Problem: RH Vision Goal: RH LTG Vision (Specify) Outcome: Progressing   Problem: RH Pre-functional/Other (Specify) Goal: RH LTG Pre-functional (Specify) Outcome: Progressing Goal: RH LTG Interdisciplinary (Specify) 1 Description: RH LTG Interdisciplinary (Specify)1 Outcome: Progressing Goal: RH LTG Interdisciplinary (Specify) 2 Description: RH LTG Interdisciplinary (Specify) 2  Outcome: Progressing   

## 2019-07-08 ENCOUNTER — Inpatient Hospital Stay (HOSPITAL_COMMUNITY): Payer: Medicare PPO | Admitting: Physical Therapy

## 2019-07-08 ENCOUNTER — Inpatient Hospital Stay (HOSPITAL_COMMUNITY): Payer: Medicare PPO

## 2019-07-08 NOTE — Plan of Care (Signed)
  Problem: Consults Goal: RH STROKE PATIENT EDUCATION Description: See Patient Education module for education specifics  Outcome: Progressing   Problem: RH BOWEL ELIMINATION Goal: RH STG MANAGE BOWEL WITH ASSISTANCE Description: STG Manage Bowel with mod I Assistance. Outcome: Progressing Goal: RH STG MANAGE BOWEL W/MEDICATION W/ASSISTANCE Description: STG Manage Bowel with Medication with mod I Assistance. Outcome: Progressing   Problem: RH BLADDER ELIMINATION Goal: RH STG MANAGE BLADDER WITH ASSISTANCE Description: STG Manage Bladder With mod I Assistance Outcome: Progressing   Problem: RH SKIN INTEGRITY Goal: RH STG MAINTAIN SKIN INTEGRITY WITH ASSISTANCE Description: STG Maintain Skin Integrity With mod I Assistance. Outcome: Progressing   Problem: RH PAIN MANAGEMENT Goal: RH STG PAIN MANAGED AT OR BELOW PT'S PAIN GOAL Description: Less than 3 out of 10 Outcome: Progressing   Problem: RH KNOWLEDGE DEFICIT Goal: RH STG INCREASE KNOWLEDGE OF HYPERTENSION Description: Patient will identify 2 blood pressure medications she is taking and indication Outcome: Progressing Goal: RH STG INCREASE KNOWLEGDE OF HYPERLIPIDEMIA Description: Patient will identify which medication on for HLD and 1 complimentary intervention Outcome: Progressing Goal: RH STG INCREASE KNOWLEDGE OF STROKE PROPHYLAXIS Outcome: Progressing   Problem: RH Vision Goal: RH LTG Vision (Specify) Outcome: Progressing   Problem: RH Pre-functional/Other (Specify) Goal: RH LTG Pre-functional (Specify) Outcome: Progressing Goal: RH LTG Interdisciplinary (Specify) 1 Description: RH LTG Interdisciplinary (Specify)1 Outcome: Progressing Goal: RH LTG Interdisciplinary (Specify) 2 Description: RH LTG Interdisciplinary (Specify) 2  Outcome: Progressing   

## 2019-07-08 NOTE — Progress Notes (Signed)
Physical Therapy Session Note  Patient Details  Name: Courtney Chan MRN: [id number] Date of Birth: Feb 27, 1931  Today's Date: 07/08/2019 PT Individual Time: 1100-1154 and 8841-6606 PT Individual Time Calculation (min): 54 min and 55 min  Short Term Goals: Week 1:  PT Short Term Goal 1 (Week 1): = to LTGs based on ELOS  Skilled Therapeutic Interventions/Progress Updates:  Treatment 1: Pt received in bed & agreeable to tx. Pt performs bed mobility with bed flat, no rails, with max cuing for supine>L sidelying>sitting and recommendation to hug pillow to help alleviate chest pain. When sitting EOB pt demonstrates L lateral lean & requires max cuing to place BLE feet flat on floor & for balance/midline correction with pt reporting feeling woozy. Provided pt with rest break then pt assisted to recliner via stand pivot CGA for back support & vitals checked.  Vitals: BP = 122/80 mmHg (LUE, sitting), HR = 77 bpm, SpO2 = 97%. Upon questioning pt reports "woozy" means she feels unstable on her feet. Provided rest break then pt wiling to ambulate & ambulated recliner>door & back with RW & CGA. After rest break pt ambulates room>dayroom with RW & CGA with ongoing slight L lateral lean with cuing for midline orientation/correcting lean. Pt then reports fatigue but does not feel "woozy" and is agreeable to therapeutic exercises. Pt performs the following: standing marches (2 sets x 15 reps), mini squats (2 sets x 20 reps), & heel raises (2 sets x 15 reps). Pt noticeably very fatigued so after rest break pt ambulates back to room with RW & CGA. Therapist provides cuing for safe hand placement for stand>sit transfers from RW with poor demo by pt. Educated pt on need to decrease speed of movement to focus on safety with mobility. Also educated her on need to clear throw rugs from floor at home to decrease tripping hazards. Pt left in recliner with chair alarm donned, call bell & all needs in reach, husband present in  room.  Pain: 7-8/10 chest soreness 2/2 pt reporting "coughing episode" right before PT arrival, pt reports she has a pain patch on & is premedicated. Encouraged pt to use pillow to alleviate pain with coughing, rolling in bed.   Treatment 2: Pt received in recliner & agreeable to tx. Pt transfers sit<>stand with supervision throughout session. Gait room>gym>ortho gym>chairs by elevators>room with RW & supervision with improved midline orientation & less L lateral lean noted.  Stair negotiation x 4 steps x 3 trials first with B rails with pt self selecting gait pattern, then with each of L & R rail with 1 rail at a time, laterally with cuing to not cross legs with supervision overall for each trial.  Discussed HHPT vs OPPT f/u with pt agreeable to OPPT, husband made aware of this. Educated pt on recommendation to use RW at all times at d/c for increased balance & for endurance purposes. Dynavision while standing without BUE support with task focusing on standing tolerance (2 minutes + 2 minutes) & standing balance with pt requiring supervision overall. Pt also performed the following exercises to challenge balance: standing with normal BOS & eyes closed, narrow BOS eyes open then closed, tandem stance without BUE support.  At end of session pt left in recliner with chair alarm donned, call bell & all needs in reach, k-pad applied to chest for pain management.  Pain: 8/10 soreness in chest, pain meds requested from nurse  Therapy Documentation Precautions:  Precautions Precautions: Fall Precaution Comments: chest pain from  compressions Restrictions Weight Bearing Restrictions: No   Therapy/Group: Individual Therapy  Sandi Mariscal 07/08/2019, 3:43 PM

## 2019-07-08 NOTE — Progress Notes (Signed)
Social Work Patient ID: Courtney Chan, female   DOB: 1930-06-06, 86 y.o.   MRN: [id number]    SW met with pt in room to provide updates from rehab team, and anticipated d/c date 07/14/19. SW informed there will be updates with further recommendations closer towards discharge.   Loralee Pacas, MSW, [place] Office: [phone] Cell: [phone] Fax: [phone]

## 2019-07-08 NOTE — Progress Notes (Signed)
PHYSICAL MEDICINE & REHABILITATION PROGRESS NOTE  Subjective/Complaints: Mrs. Ricklefs continues to complain of soreness in her chest and does feel that the lidocaine patch helps.  She has no other complaints. She is sleeping well, moving bowels regularly, has a great attitude.   ROS: Patient denies fever, rash, sore throat, blurred vision, nausea, vomiting, diarrhea, cough, shortness of breath or chest pain, joint or back pain, headache, or mood change.   Objective: Vital Signs: Blood pressure (!) 112/54, pulse 89, temperature 97.6 F (36.4 C), resp. rate 18, height 5' 3.5" (1.613 m), weight 67.6 kg, SpO2 93 %. No results found. Recent Labs    07/06/19 0554  WBC 10.8*  HGB 11.6*  HCT 36.6  PLT 364   Recent Labs    07/06/19 0554  NA 139  K 4.1  CL 102  CO2 24  GLUCOSE 100*  BUN 20  CREATININE 0.94  CALCIUM 9.0    Physical Exam: BP (!) 112/54 (BP Location: Right Arm)   Pulse 89   Temp 97.6 F (36.4 C)   Resp 18   Ht 5' 3.5" (1.613 m)   Wt 67.6 kg   SpO2 93%   BMI 25.99 kg/m  Constitutional: No distress . Vital signs reviewed. Sitting up in therapy gym.  HEENT: EOMI, oral membranes moist Neck: supple Cardiovascular: IRR IRR without murmur. No JVD    Respiratory: CTA Bilaterally without wheezes or rales. Normal effort    GI: BS +, non-tender, non-distended  Skin: Warm and dry.  Intact. Psych: Normal mood.  Normal behavior. Musc: No edema in extremities.  No tenderness in extremities.  Neurological: alert and oriented to name, place, mo, year, why she's here. Voice strong. No CN findings Strength 4 to 4+/5. Normal sensation  Assessment/Plan: 1. Functional deficits secondary to debility which require 3+ hours per day of interdisciplinary therapy in a comprehensive inpatient rehab setting.  Physiatrist is providing close team supervision and 24 hour management of active medical problems listed below.  Physiatrist and rehab team continue to assess  barriers to discharge/monitor patient progress toward functional and medical goals  Care Tool:  Bathing    Body parts bathed by patient: Right arm, Left arm, Chest, Abdomen, Front perineal area, Buttocks, Right upper leg, Left upper leg, Left lower leg, Right lower leg, Face         Bathing assist Assist Level: Contact Guard/Touching assist     Upper Body Dressing/Undressing Upper body dressing   What is the patient wearing?: Pull over shirt    Upper body assist Assist Level: Set up assist    Lower Body Dressing/Undressing Lower body dressing      What is the patient wearing?: Pants     Lower body assist Assist for lower body dressing: Independent with assitive device     Toileting Toileting Toileting Activity did not occur (Clothing management and hygiene only): N/A (no void or bm)  Toileting assist Assist for toileting: Moderate Assistance - Patient 50 - 74%     Transfers Chair/bed transfer  Transfers assist     Chair/bed transfer assist level: Minimal Assistance - Patient > 75%     Locomotion Ambulation   Ambulation assist      Assist level: Minimal Assistance - Patient > 75% Assistive device: No Device Max distance: 150'   Walk 10 feet activity   Assist     Assist level: Contact Guard/Touching assist Assistive device: No Device   Walk 50 feet activity   Assist  Assist level: Minimal Assistance - Patient > 75% Assistive device: No Device    Walk 150 feet activity   Assist Walk 150 feet activity did not occur: Safety/medical concerns  Assist level: Minimal Assistance - Patient > 75%      Walk 10 feet on uneven surface  activity   Assist     Assist level: Minimal Assistance - Patient > 75% Assistive device: Other (comment)(railing as needed)   Wheelchair     Assist Will patient use wheelchair at discharge?: No(anticipate pt will be a functional ambulator)             Wheelchair 50 feet with 2 turns  activity    Assist            Wheelchair 150 feet activity     Assist         Medical Problem List and Plan: 1.  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 fib  -Continue CIR therapies including PT, OT, and SLP 2.  Antithrombotics: -DVT/anticoagulation: Pradaxa 150 mg every 12 hours             -antiplatelet therapy: Plavix 75 mg daily 3. Pain Management: Lidoderm patch, Ultram as needed. Pain is currently well controlled.  4. Mood: Provide emotional support              -antipsychotic agents: N/A 5. Neuropsych: This patient is capable of making decisions on her own behalf. 6. Skin/Wound Care: Routine skin checks 7. Fluids/Electrolytes/Nutrition: encourage po   -protein supp for low albumin  8.  Atrial fibrillation.  Continue Pradaxa.   Continue Toprol 100 mg daily             Monitor with increased exertion  -Flowsheet reviewed. HR controlled in general.   9.  Diastolic congestive heart failure.  Lasix 20 mg daily as well as Cozaar 25 mg daily.  2/23, 2/24 weights stable Filed Weights   07/06/19 1052 07/07/19 0600 07/08/19 0446  Weight: 67.1 kg 68.4 kg 67.6 kg  10.  Hypothyroidism.    Continue Synthroid 11.  Hyperlipidemia Lipitor 12.  CKD.  Creatinine baseline 1.05-1.18.               2/22 Cr 0.94 13. Blood tinged sputum:  -no change  -HGB up to 11.6 2/22  -likely d/t repeated coughing, pradaxa/plavix.  chest also sore from CPR  -added mucinex dm, MMW w/ lidocaine and chlorseptic spray  -lidocaine, ice/heat to sternum, is providing relief.    LOS: 4 days A FACE TO FACE EVALUATION WAS PERFORMED  Horton Chin 07/08/2019, 9:57 AM

## 2019-07-08 NOTE — Progress Notes (Addendum)
Occupational Therapy Session Note  Patient Details  Name: Courtney Chan MRN: [id number] Date of Birth: 23-Jun-1930  Today's Date: 07/08/2019 OT Individual Time: 1324-4010 OT Individual Time Calculation (min): 60 min    Short Term Goals: Week 1:  OT Short Term Goal 1 (Week 1): STGs=LTGs due to ELOS  Skilled Therapeutic Interventions/Progress Updates:    Pt received sitting up in bed with c/o chest pain from soreness of CPR- using k-pad. Pt had just begun eating breakfast and was given 15 min to finish eat breakfast- 15 min missed. Pt completed bed mobility with min cueing for sidelying > sitting technique to reduce pressure/pain in the chest. Pt able to complete with CGA. Pt given pillow to brace chest for pain relief during functional mobility to the sink, CGA. Pt completed oral care and UB bathing with set up assist.  Min A to don sweater to pull overhead. Pt able to wash peri areas in standing with CGA. Pt donned pants with CGA sit <> stand at the sink with intermittent UE support. Pt required seated rest break following bathing. Pt used pillow to brace chest as she completed 100 ft of functional mobility to the therapy gym. Pt sat EOM and completed 3x 10 sit <> stands for increasing functional activity tolerance and reducing UE reliance with transfers. Pt completed last 2 sets on non-compliant foam mat to grade activity up for balance demands. Pt ended with BUE strengthening circuit using 3 # dumbbells. Edu pt on importance of BUE AROM to reduce tightness in chest/shoulders. Pt returned to room and was left supine with all needs met.   Therapy Documentation Precautions:  Precautions Precautions: Fall Precaution Comments: chest pain from compressions Restrictions Weight Bearing Restrictions: No  Therapy/Group: Individual Therapy  Curtis Sites 07/08/2019, 6:58 AM

## 2019-07-09 ENCOUNTER — Inpatient Hospital Stay (HOSPITAL_COMMUNITY): Payer: Medicare PPO | Admitting: Physical Therapy

## 2019-07-09 ENCOUNTER — Inpatient Hospital Stay (HOSPITAL_COMMUNITY): Payer: Medicare PPO | Admitting: Occupational Therapy

## 2019-07-09 NOTE — Plan of Care (Signed)
  Problem: Consults Goal: RH STROKE PATIENT EDUCATION Description: See Patient Education module for education specifics  Outcome: Progressing   Problem: RH BOWEL ELIMINATION Goal: RH STG MANAGE BOWEL WITH ASSISTANCE Description: STG Manage Bowel with mod I Assistance. Outcome: Progressing Goal: RH STG MANAGE BOWEL W/MEDICATION W/ASSISTANCE Description: STG Manage Bowel with Medication with mod I Assistance. Outcome: Progressing   Problem: RH BLADDER ELIMINATION Goal: RH STG MANAGE BLADDER WITH ASSISTANCE Description: STG Manage Bladder With mod I Assistance Outcome: Progressing   Problem: RH SKIN INTEGRITY Goal: RH STG MAINTAIN SKIN INTEGRITY WITH ASSISTANCE Description: STG Maintain Skin Integrity With mod I Assistance. Outcome: Progressing   Problem: RH PAIN MANAGEMENT Goal: RH STG PAIN MANAGED AT OR BELOW PT'S PAIN GOAL Description: Less than 3 out of 10 Outcome: Progressing   Problem: RH KNOWLEDGE DEFICIT Goal: RH STG INCREASE KNOWLEDGE OF HYPERTENSION Description: Patient will identify 2 blood pressure medications she is taking and indication Outcome: Progressing Goal: RH STG INCREASE KNOWLEGDE OF HYPERLIPIDEMIA Description: Patient will identify which medication on for HLD and 1 complimentary intervention Outcome: Progressing Goal: RH STG INCREASE KNOWLEDGE OF STROKE PROPHYLAXIS Outcome: Progressing   Problem: RH Vision Goal: RH LTG Vision (Specify) Outcome: Progressing   Problem: RH Pre-functional/Other (Specify) Goal: RH LTG Pre-functional (Specify) Outcome: Progressing Goal: RH LTG Interdisciplinary (Specify) 1 Description: RH LTG Interdisciplinary (Specify)1 Outcome: Progressing Goal: RH LTG Interdisciplinary (Specify) 2 Description: RH LTG Interdisciplinary (Specify) 2  Outcome: Progressing

## 2019-07-09 NOTE — Plan of Care (Signed)
Long term bed mobility goals downgraded supervision assist. See below.    Problem: RH Bed Mobility Goal: LTG Patient will perform bed mobility with assist (PT) Description: LTG: Patient will perform bed mobility with assistance, with/without cues (PT). Flowsheets (Taken 07/09/2019 1300) LTG: Pt will perform bed mobility with assistance level of: Supervision/Verbal cueing Note: Downgraded due continued pain in ribs.      Grier Rocher PT, DPT

## 2019-07-09 NOTE — Progress Notes (Signed)
Occupational Therapy Session Note  Patient Details  Name: Courtney Chan MRN: [id number] Date of Birth: 1931-03-06  Today's Date: 07/09/2019 OT Individual Time: 6568-1275 OT Individual Time Calculation (min): 73 min   Short Term Goals: Week 1:  OT Short Term Goal 1 (Week 1): STGs=LTGs due to ELOS  Skilled Therapeutic Interventions/Progress Updates:    Pt greeted semi-reclined in bed and agreeable to OT treatment session. Pt completed bed mobility with HOB elevated and supervision. Pt ambulated into bathroom w/ supervision. Pt voided bowel and bladder and completed toileting tasks w/ supervision. Pt then ambulated out of bathroom in similar fashion and stood to brush teeth and wash hands. Pt returned to sitting for UB bathing with set-up A. Close supervision with 1 lateral LOB requiring CGA for balance when standing for LB bathing. Pt wanted to wash hair this morning at the sink. Pt able to stand for 5 minutes with head under sink and maintain balance while washing hair. Pt then stood for another minute to blow dry hair. Pt needed OT assist to finish blow drying 2/2 UE fatigue. Pt ambulated to therpay gym with supervision. Worked on UB and chest stretching to decrease soreness as well as deep breathing techniques. Reiterated importance of filling lungs despite pain. Pt ambulated back to room and left seated in recliner with alarm belt on and call bell in reach.   Therapy Documentation Precautions:  Precautions Precautions: Fall Precaution Comments: chest pain from compressions Restrictions Weight Bearing Restrictions: No Pain: Pain Assessment Pain Scale:Pt reports pain with bed mobility in ribs/chest. No number given, but stated it was tolerable. OT applied K-pad and pillow for pain management at end of session.    Therapy/Group: Individual Therapy  Mal Amabile 07/09/2019, 7:46 AM

## 2019-07-09 NOTE — Progress Notes (Signed)
Time PHYSICAL MEDICINE & REHABILITATION PROGRESS NOTE  Subjective/Complaints: Complains of ongoing chest soreness. Not too much of a change, a little better if anything. Coughing is slowly improving.   ROS: Patient denies fever, rash, sore throat, blurred vision, nausea, vomiting, diarrhea, shortness of breath   joint or back pain, headache, or mood change.    Objective: Vital Signs: Blood pressure 92/66, pulse 79, temperature 97.7 F (36.5 C), temperature source Oral, resp. rate 18, height 5' 3.5" (1.613 m), weight 66.6 kg, SpO2 96 %. No results found. No results for input(s): WBC, HGB, HCT, PLT in the last 72 hours. No results for input(s): NA, K, CL, CO2, GLUCOSE, BUN, CREATININE, CALCIUM in the last 72 hours.  Physical Exam: BP 92/66   Pulse 79   Temp 97.7 F (36.5 C) (Oral)   Resp 18   Ht 5' 3.5" (1.613 m)   Wt 66.6 kg   SpO2 96%   BMI 25.60 kg/m  Constitutional: No distress . Vital signs reviewed. HEENT: EOMI, oral membranes moist Neck: supple Cardiovascular: IRR without murmur. No JVD    Respiratory/Chest: CTA Bilaterally without wheezes or rales. Normal effort. Chest, sternum still sore GI/Abdomen: BS +, non-tender, non-distended Ext: no edema, pulses intact, limbs warm Skin: Warm and dry.  Intact. Psych: pleasant Musc: No edema in extremities.  No tenderness in extremities.  Neurological: alert and oriented to name, place, mo, year, why she's here. Voice strong. No CN findings--neuro exam unchanged today Strength 4 to 4+/5. Normal sensation  Assessment/Plan: 1. Functional deficits secondary to debility which require 3+ hours per day of interdisciplinary therapy in a comprehensive inpatient rehab setting.  Physiatrist is providing close team supervision and 24 hour management of active medical problems listed below.  Physiatrist and rehab team continue to assess barriers to discharge/monitor patient progress toward functional and medical goals  Care  Tool:  Bathing    Body parts bathed by patient: Right arm, Left arm, Chest, Abdomen, Front perineal area, Buttocks, Right upper leg, Left upper leg, Left lower leg, Right lower leg, Face         Bathing assist Assist Level: Contact Guard/Touching assist     Upper Body Dressing/Undressing Upper body dressing   What is the patient wearing?: Pull over shirt    Upper body assist Assist Level: Set up assist    Lower Body Dressing/Undressing Lower body dressing      What is the patient wearing?: Pants     Lower body assist Assist for lower body dressing: Independent with assitive device     Toileting Toileting Toileting Activity did not occur (Clothing management and hygiene only): N/A (no void or bm)  Toileting assist Assist for toileting: Moderate Assistance - Patient 50 - 74%     Transfers Chair/bed transfer  Transfers assist     Chair/bed transfer assist level: Supervision/Verbal cueing     Locomotion Ambulation   Ambulation assist      Assist level: Supervision/Verbal cueing Assistive device: No Device Max distance: 200   Walk 10 feet activity   Assist     Assist level: Supervision/Verbal cueing Assistive device: No Device   Walk 50 feet activity   Assist    Assist level: Supervision/Verbal cueing Assistive device: No Device    Walk 150 feet activity   Assist Walk 150 feet activity did not occur: Safety/medical concerns  Assist level: Supervision/Verbal cueing Assistive device: No Device    Walk 10 feet on uneven surface  activity   Assist  Assist level: Minimal Assistance - Patient > 75% Assistive device: Other (comment)(railing as needed)   Wheelchair     Assist Will patient use wheelchair at discharge?: No(anticipate pt will be a functional ambulator)             Wheelchair 50 feet with 2 turns activity    Assist            Wheelchair 150 feet activity     Assist         Medical Problem  List and Plan: 1.  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 fib  -Continue CIR therapies including PT, OT, and SLP  -progressing toward goals 2.  Antithrombotics: -DVT/anticoagulation: Pradaxa 150 mg every 12 hours             -antiplatelet therapy: Plavix 75 mg daily 3. Pain Management: Lidoderm patch, Ultram as needed. Pain is currently well controlled.  4. Mood: Provide emotional support              -antipsychotic agents: N/A 5. Neuropsych: This patient is capable of making decisions on her own behalf. 6. Skin/Wound Care: Routine skin checks 7. Fluids/Electrolytes/Nutrition: encourage po   -protein supp for low albumin  8.  Atrial fibrillation.  Continue Pradaxa.   Continue Toprol 100 mg daily             Monitor with increased exertion    HR controlled in general.   9.  Diastolic congestive heart failure.  Lasix 20 mg daily as well as Cozaar 25 mg daily.  2/23, 2/24, 2/25 weights stable Filed Weights   07/07/19 0600 07/08/19 0446 07/09/19 0503  Weight: 68.4 kg 67.6 kg 66.6 kg  10.  Hypothyroidism.    Continue Synthroid 11.  Hyperlipidemia Lipitor 12.  CKD.  Creatinine baseline 1.05-1.18.               2/22 Cr 0.94 13. Blood tinged sputum:  -no change  -HGB up to 11.6 2/22  -likely d/t repeated coughing, pradaxa/plavix.  chest also sore from CPR  -added mucinex dm, MMW w/ lidocaine and chlorseptic spray  -lidocaine, ice/heat to sternum, is providing partial relief.    LOS: 5 days A FACE TO FACE EVALUATION WAS PERFORMED  Meredith Staggers 07/09/2019, 11:52 AM

## 2019-07-09 NOTE — Progress Notes (Signed)
Physical Therapy Session Note  Patient Details  Name: Courtney Chan MRN: [id number] Date of Birth: 07/12/1930  Today's Date: 07/09/2019 PT Individual Time: 1432-1502 PT Individual Time Calculation (min): 30 min   Short Term Goals: Week 1:  PT Short Term Goal 1 (Week 1): = to LTGs based on ELOS  Skilled Therapeutic Interventions/Progress Updates: Pt presents in recliner chair and agreeable to participate w/ therapy.  Pt transfers w/ supervision multiple trials w/ verbal cues for hand placement, especially stand to sit. Pt amb multiple distance trials up to 150' w/ RW and supervision.  Pt requires verbal cues for speed and safety.  Pt performed car transfer w/ supervision.  Pt negotiated 4 steps w/ right hand rail and supervision.  Pt returned to recliner chair w/ all needs in place and seat alarm on, spouse present.     Therapy Documentation Precautions:  Precautions Precautions: Fall Precaution Comments: chest pain from compressions Restrictions Weight Bearing Restrictions: No General:   Vital Signs: Therapy Vitals Temp: 98.1 F (36.7 C) Pulse Rate: 78 Resp: 18 BP: 97/63 Patient Position (if appropriate): Sitting Oxygen Therapy SpO2: 96 % O2 Device: Room Air Pain: 8/10 chest., has K-pad and lidoderm patch. Pain Assessment Pain Scale: 0-10 Pain Score: 7  Pain Type: Acute pain Pain Location: Chest Pain Orientation: Mid Pain Descriptors / Indicators: Sore Patients Stated Pain Goal: 5 Pain Intervention(s): Ambulation/increased activity;Repositioned Mobility:      Therapy/Group: Individual Therapy  Lucio Edward 07/09/2019, 3:03 PM

## 2019-07-09 NOTE — Progress Notes (Signed)
Physical Therapy Session Note  Patient Details  Name: Courtney Chan MRN: [id number] Date of Birth: 03/29/1931  Today's Date: 07/09/2019 PT Individual Time: 0930-1045 PT Individual Time Calculation (min): 75 min   Short Term Goals: Week 1:  PT Short Term Goal 1 (Week 1): = to LTGs based on ELOS  Skilled Therapeutic Interventions/Progress Updates:   Pt received sitting in recliner and agreeable to PT.  Pt transferred to [facility] with RW and distant supervision assist for safety.   PT instructed pt in gait training with RW x 22ft and distant supervision assist. Then completed gait training without AD x 298ft and supervision assist. Min cues for safety with transfers.   DGI performed. PT instructed pt in DGI. See below for results. Demonstrates low fall risk with score of 20/24. (<19 indicates increased fall risk)   Bed mobility instructed to simulated home environment in training apartment. Sit<>supine x 2 with supervision assist and min-mod verbal cues for log roll technique on the R side of the bed. Increased pain reports with each transfer in chest, requiring prolonged rest break to allow pain to subside.   Pt transported back to room. Ambulatory transfer to recliner with supervision assist no AD. seated BLE therex instructed by PT with level 3 tband. LAQ x 10 ankle PF x15, HS curl x 10 , hip abduction x 12 , hip flexion x 10. Cues for full ROM and decreased speed with eccentric motion intermittently and significantly improved technique following verbal instruction.           Therapy Documentation Precautions:  Precautions Precautions: Fall Precaution Comments: chest pain from compressions Restrictions Weight Bearing Restrictions: No Vital Signs: Therapy Vitals Pulse Rate: 79 BP: 92/66 Pain: Pain Assessment Pain Scale: 0-10 Pain Score: 7  Pain Type: Acute pain Pain Location: Chest Pain Orientation: Mid Pain Descriptors / Indicators: Sore Patients Stated Pain Goal: 5 Pain  Intervention(s): Ambulation/increased activity;Repositioned     Balance: Standardized Balance Assessment Standardized Balance Assessment: Dynamic Gait Index;Functional Gait Assessment Dynamic Gait Index Level Surface: Normal Change in Gait Speed: Mild Impairment Gait with Horizontal Head Turns: Mild Impairment Gait with Vertical Head Turns: Normal Gait and Pivot Turn: Normal Step Over Obstacle: Mild Impairment Step Around Obstacles: Normal Steps: Mild Impairment Total Score: 20    Therapy/Group: Individual Therapy  Golden Pop 07/09/2019, 11:08 AM

## 2019-07-10 ENCOUNTER — Inpatient Hospital Stay (HOSPITAL_COMMUNITY): Payer: Medicare PPO | Admitting: Occupational Therapy

## 2019-07-10 ENCOUNTER — Inpatient Hospital Stay (HOSPITAL_COMMUNITY): Payer: Medicare PPO | Admitting: Physical Therapy

## 2019-07-10 MED ORDER — METOPROLOL SUCCINATE ER 50 MG PO TB24: 150.0000 mg | ORAL_TABLET | Freq: Every day | ORAL | 1 refills | Status: DC

## 2019-07-10 MED ORDER — LOSARTAN POTASSIUM 25 MG PO TABS: 12.5000 mg | ORAL_TABLET | Freq: Every day | ORAL | 1 refills | Status: DC

## 2019-07-10 MED ORDER — FUROSEMIDE 20 MG PO TABS: 20.0000 mg | ORAL_TABLET | Freq: Every day | ORAL | 1 refills | Status: DC

## 2019-07-10 MED ORDER — CLOPIDOGREL BISULFATE 75 MG PO TABS: 75.0000 mg | ORAL_TABLET | Freq: Every day | ORAL | 1 refills | Status: DC

## 2019-07-10 MED ORDER — LIDOCAINE 5 % EX PTCH: 1.0000 | MEDICATED_PATCH | CUTANEOUS | 0 refills | Status: DC

## 2019-07-10 MED ORDER — LEVOTHYROXINE SODIUM 88 MCG PO TABS: 88.0000 ug | ORAL_TABLET | Freq: Every day | ORAL | 1 refills | Status: AC

## 2019-07-10 MED ORDER — ATORVASTATIN CALCIUM 80 MG PO TABS: 80.0000 mg | ORAL_TABLET | Freq: Every day | ORAL | 0 refills | Status: DC

## 2019-07-10 MED ORDER — TRAMADOL HCL 50 MG PO TABS: 50.0000 mg | ORAL_TABLET | Freq: Four times a day (QID) | ORAL | 0 refills | Status: DC | PRN

## 2019-07-10 MED ORDER — DABIGATRAN ETEXILATE MESYLATE 150 MG PO CAPS: 150.0000 mg | ORAL_CAPSULE | Freq: Two times a day (BID) | ORAL | 1 refills | Status: DC

## 2019-07-10 NOTE — Progress Notes (Signed)
Physical Therapy Session Note  Patient Details  Name: Courtney Chan MRN: [id number] Date of Birth: [birth year]  Today's Date: 07/10/2019 PT Individual Time: 1007-1100 PT Individual Time Calculation (min): 53 min   Short Term Goals: Week 1:  PT Short Term Goal 1 (Week 1): = to LTGs based on ELOS  Skilled Therapeutic Interventions/Progress Updates:  Pt received in recliner & agreeable to tx. Educated pt on recommendation to use RW for mobility & increased safety at d/c with pt reporting understanding. Reviewed new d/c date with pt. Sit<>stand with supervision & pt ambulates room>dayroom>chairs by elevators>gym>apartment>chairs by elevators>room with RW & supervision with cuing to decrease gait speed. Pt completes Berg Balance Test & scores 35/56; educated pt on interpretation of score & current fall risk, as well as recommendation for supervision & to use RW at d/c. Patient demonstrates increased fall risk as noted by score of 35/56 on Berg Balance Scale.  (<36= high risk for falls, close to 100%; 37-45 significant >80%; 46-51 moderate >50%; 52-55 lower >25%). Practiced stair negotiation in preparation for pt not having rails installed at house in time for d/c. Pt requires min assist HHA to negotiate 8 steps without rails & pt reports she feels comfortable negotiating stairs in this manner at home with her husband's assistance. In apartment, pt completes bed mobility with supervision, with cuing for log rolling technique but requires multimodal cuing to complete task and does experience significant chest soreness with task & requires rest breaks. At end of session pt left in recliner with chair alarm donned, nurse in room applying pain patch, husband in room.    Therapy Documentation Precautions:  Precautions Precautions: Fall Precaution Comments: chest pain from compressions Restrictions Weight Bearing Restrictions: No  Pain: 9/10 chest soreness - asked nurse if pt could receive pain  patch  Balance: Balance Balance Assessed: Yes Standardized Balance Assessment Standardized Balance Assessment: Berg Balance Test Berg Balance Test Sit to Stand: Able to stand without using hands and stabilize independently Standing Unsupported: Able to stand safely 2 minutes Sitting with Back Unsupported but Feet Supported on Floor or Stool: Able to sit safely and securely 2 minutes Stand to Sit: Sits safely with minimal use of hands Transfers: Able to transfer safely, minor use of hands Standing Unsupported with Eyes Closed: Able to stand 10 seconds with supervision Standing Ubsupported with Feet Together: Needs help to attain position and unable to hold for 15 seconds From Standing, Reach Forward with Outstretched Arm: Can reach forward >5 cm safely (2") From Standing Position, Pick up Object from Floor: Able to pick up shoe, needs supervision From Standing Position, Turn to Look Behind Over each Shoulder: Needs supervision when turning(does not even fully turn sideways) Turn 360 Degrees: Needs close supervision or verbal cueing Standing Unsupported, Alternately Place Feet on Step/Stool: Able to complete 4 steps without aid or supervision Standing Unsupported, One Foot in Front: Able to take small step independently and hold 30 seconds Standing on One Leg: Tries to lift leg/unable to hold 3 seconds but remains standing independently Total Score: 35     Therapy/Group: Individual Therapy  Sandi Mariscal 07/10/2019, 11:41 AM

## 2019-07-10 NOTE — Plan of Care (Signed)
  Problem: Consults Goal: RH STROKE PATIENT EDUCATION Description: See Patient Education module for education specifics  Outcome: Progressing   Problem: RH BOWEL ELIMINATION Goal: RH STG MANAGE BOWEL WITH ASSISTANCE Description: STG Manage Bowel with mod I Assistance. Outcome: Progressing Goal: RH STG MANAGE BOWEL W/MEDICATION W/ASSISTANCE Description: STG Manage Bowel with Medication with mod I Assistance. Outcome: Progressing   Problem: RH BLADDER ELIMINATION Goal: RH STG MANAGE BLADDER WITH ASSISTANCE Description: STG Manage Bladder With mod I Assistance Outcome: Progressing   Problem: RH SKIN INTEGRITY Goal: RH STG MAINTAIN SKIN INTEGRITY WITH ASSISTANCE Description: STG Maintain Skin Integrity With mod I Assistance. Outcome: Progressing   Problem: RH PAIN MANAGEMENT Goal: RH STG PAIN MANAGED AT OR BELOW PT'S PAIN GOAL Description: Less than 3 out of 10 Outcome: Progressing   Problem: RH KNOWLEDGE DEFICIT Goal: RH STG INCREASE KNOWLEDGE OF HYPERTENSION Description: Patient will identify 2 blood pressure medications she is taking and indication Outcome: Progressing Goal: RH STG INCREASE KNOWLEGDE OF HYPERLIPIDEMIA Description: Patient will identify which medication on for HLD and 1 complimentary intervention Outcome: Progressing Goal: RH STG INCREASE KNOWLEDGE OF STROKE PROPHYLAXIS Outcome: Progressing   Problem: RH Vision Goal: RH LTG Vision (Specify) Outcome: Progressing   Problem: RH Pre-functional/Other (Specify) Goal: RH LTG Pre-functional (Specify) Outcome: Progressing Goal: RH LTG Interdisciplinary (Specify) 1 Description: RH LTG Interdisciplinary (Specify)1 Outcome: Progressing Goal: RH LTG Interdisciplinary (Specify) 2 Description: RH LTG Interdisciplinary (Specify) 2  Outcome: Progressing   

## 2019-07-10 NOTE — Progress Notes (Signed)
Physical Therapy Discharge Summary  Patient Details  Name: Courtney Chan MRN: [id number] Date of Birth: [birth year]  Today's Date: 07/10/2019   Patient has met 10 of 10 long term goals due to improved activity tolerance, improved balance, improved postural control, increased strength and ability to compensate for deficits.  Patient to discharge at an ambulatory level Supervision with RW, min assist stairs without B rails. Patient's care partner is independent to provide the necessary physical assistance at discharge.  Reasons goals not met: All PT goals met   Recommendation:  Patient will benefit from ongoing skilled PT services in outpatient setting to continue to advance safe functional mobility, address ongoing impairments in balance, endurance, stair negotiation, and minimize fall risk.  Equipment: No equipment provided - pt already has RW  Reasons for discharge: treatment goals met and discharge from hospital  Patient/family agrees with progress made and goals achieved: Yes  PT Discharge Precautions/Restrictions Precautions Precautions: Fall Precaution Comments: chest pain from compressions Restrictions Weight Bearing Restrictions: No  Vision/Perception  Wears glasses at all times, cataracts at baseline. No changes in baseline vision. No apparent visual deficits. Perception Perception: Within Functional Limits Praxis Praxis: Intact   Cognition Overall Cognitive Status: Within Functional Limits for tasks assessed Arousal/Alertness: Awake/alert Orientation Level: Oriented X4 Focused Attention: Appears intact Sustained Attention: Appears intact Memory: Appears intact Awareness: Appears intact Problem Solving: Appears intact Safety/Judgment: Appears intact   Sensation Sensation Light Touch: Appears Intact Proprioception: Appears Intact Coordination Fine Motor Movements are Fluid and Coordinated: Yes  Motor  Motor Motor: Other (comment);Abnormal postural  alignment and control Motor - Skilled Clinical Observations: Generalized weakness Motor - Discharge Observations: Generalized weakness   Mobility Bed Mobility Bed Mobility: Rolling Right;Supine to Sit;Sit to Supine Rolling Right: Supervision/verbal cueing Supine to Sit: Supervision/Verbal cueing Sit to Supine: Supervision/Verbal cueing Transfers Transfers: Sit to Stand;Stand to Sit Sit to Stand: Independent Stand to Sit: Independent  Locomotion  Gait Ambulation: Yes Gait Assistance: Independent with assistive device Gait Distance (Feet): 150 Feet Assistive device: Rolling walker Gait Gait: Yes Gait Pattern: Impaired Gait Pattern: Wide base of support Gait velocity: WFL Stairs / Additional Locomotion Stairs: Yes Stairs Assistance: Supervision/Verbal cueing Stair Management Technique: No rails Number of Stairs: 12 Height of Stairs: 6 Curb: Supervision/Verbal cueing Wheelchair Mobility Wheelchair Mobility: No   Trunk/Postural Assessment  Cervical Assessment Cervical Assessment: Exceptions to WFL(forward head) Thoracic Assessment Thoracic Assessment: Exceptions to WFL(thoracic kyphosis, rounded shoulders) Lumbar Assessment Lumbar Assessment: Exceptions to WFL(posterior pelvic tilt) Postural Control Postural Control: Deficits on evaluation Righting Reactions: delayed and inadequate   Balance Balance Balance Assessed: Yes Standardized Balance Assessment Standardized Balance Assessment: Berg Balance Test Berg Balance Test Sit to Stand: Able to stand without using hands and stabilize independently Standing Unsupported: Able to stand safely 2 minutes Sitting with Back Unsupported but Feet Supported on Floor or Stool: Able to sit safely and securely 2 minutes Stand to Sit: Sits safely with minimal use of hands Transfers: Able to transfer safely, minor use of hands Standing Unsupported with Eyes Closed: Able to stand 10 seconds with supervision Standing Ubsupported  with Feet Together: Needs help to attain position and unable to hold for 15 seconds From Standing, Reach Forward with Outstretched Arm: Can reach forward >5 cm safely (2") From Standing Position, Pick up Object from Floor: Able to pick up shoe, needs supervision From Standing Position, Turn to Look Behind Over each Shoulder: Needs supervision when turning(does not even fully turn sideways) Turn 360 Degrees: Needs close supervision or  verbal cueing Standing Unsupported, Alternately Place Feet on Step/Stool: Able to complete 4 steps without aid or supervision Standing Unsupported, One Foot in Front: Able to take small step independently and hold 30 seconds Standing on One Leg: Tries to lift leg/unable to hold 3 seconds but remains standing independently Total Score: 35   DGI = 20 on 07/09/19  Extremity Assessment  RUE Assessment RUE Assessment: Within Functional Limits LUE Assessment LUE Assessment: Within Functional Limits RLE Assessment RLE Assessment: Within Functional Limits LLE Assessment LLE Assessment: Within Functional Limits    Lavone Nian, PT, DPT 07/10/2019, 12:43 PM  Waunita Schooner 07/10/2019, 12:43 PM

## 2019-07-10 NOTE — Discharge Summary (Signed)
Physician Discharge Summary  Patient ID: Courtney Chan MRN: [id number] DOB/AGE: 84/10/1930 84 y.o.  Admit date: 07/04/2019 Discharge date: 07/12/2019  Discharge Diagnoses:  Principal Problem:   Debility Active Problems:   Left middle cerebral artery stroke (HCC)   Hypothyroidism   Chronic kidney disease   Supplemental oxygen dependent DVT prophylaxis Atrial fibrillation Diastolic congestive heart failure Hypothyroidism  Discharged Condition: Stable  Significant Diagnostic Studies: CT ANGIO HEAD W OR WO CONTRAST  Result Date: 06/29/2019 CLINICAL DATA:  Stroke follow-up EXAM: CT ANGIOGRAPHY HEAD AND NECK TECHNIQUE: Multidetector CT imaging of the head and neck was performed using the standard protocol during bolus administration of intravenous contrast. Multiplanar CT image reconstructions and MIPs were obtained to evaluate the vascular anatomy. Carotid stenosis measurements (when applicable) are obtained utilizing NASCET criteria, using the distal internal carotid diameter as the denominator. CONTRAST:  Dose is currently not available COMPARISON:  None. FINDINGS: CTA NECK FINDINGS Aortic arch: Atherosclerotic plaque.  Two vessel branching. Right carotid system: Mild to moderate mainly calcified plaque at the bifurcation/bulb. No flow limiting stenosis or ulceration. Left carotid system: Atheromatous plaque of the distal common carotid which is mild for age. No flow limiting stenosis or ulceration. Vertebral arteries: Proximal subclavian atherosclerosis on the left more than right without flow limiting stenosis or ulceration. Codominant vertebral arteries that show robust flow to the dura. Skeleton: Degenerative changes without acute or aggressive finding. Other neck: No evidence of mass or inflammation Upper chest: Ground-glass and streaky opacity in the right more than left lung. No opacification at the left atrial appendage. Review of the MIP images confirms the above findings CTA HEAD  FINDINGS Anterior circulation: Atherosclerotic plaque on the carotid siphons. No branch occlusion or flow limiting stenosis. Negative for aneurysm Posterior circulation: Vertebral and basilar arteries are smooth and widely patent. No branch occlusion or aneurysm. Venous sinuses: Unremarkable Anatomic variants: None significant Review of the MIP images confirms the above findings IMPRESSION: 1. No emergent vascular finding 2. Atherosclerosis without flow limiting stenosis or ulceration in the head and neck. 3. Non opacified left atrial appendage in the setting of atrial fibrillation that could be delayed filling versus thrombus. 4. Right more than left pneumonia and atelectasis. Electronically Signed   By: Marnee SpringJonathon  Watts M.D.   On: 06/29/2019 10:29   DG Chest 2 View  Result Date: 06/28/2019 CLINICAL DATA:  Chest pain shortness of breath EXAM: CHEST - 2 VIEW COMPARISON:  August 25, 2017 FINDINGS: The mediastinal contour and cardiac silhouette are normal. Mild opacity of left lung base is identified at least in part due to atelectasis but superimposed early pneumonia is not excluded. There is no pulmonary edema or pleural effusion. The bony structures are stable. IMPRESSION: Mild opacity of left lung base at least in part due to atelectasis but superimposed early pneumonia is not excluded. Electronically Signed   By: Sherian ReinWei-Chen  Lin M.D.   On: 06/28/2019 14:41   DG Abd 1 View  Result Date: 06/29/2019 CLINICAL DATA:  NG tube placement. EXAM: ABDOMEN - 1 VIEW COMPARISON:  06/28/2019 FINDINGS: The tip of the NG tube is in the midbody of the stomach. The side hole is near the gastroesophageal junction. Bowel gas pattern is normal. Contrast is seen in the nondistended renal collecting systems. IMPRESSION: NG tube tip in the body of the stomach. Electronically Signed   By: Francene BoyersJames  Maxwell M.D.   On: 06/29/2019 14:59   CT Head Wo Contrast  Result Date: 06/28/2019 CLINICAL DATA:  Cardiac arrest, unresponsive  and  pulseless upon arrival to ED, intubated EXAM: CT HEAD WITHOUT CONTRAST TECHNIQUE: Contiguous axial images were obtained from the base of the skull through the vertex without intravenous contrast. COMPARISON:  06/28/2019 FINDINGS: Brain: Chronic small vessel ischemic changes are seen within the bilateral frontal periventricular white matter and left external capsule. No acute infarct or hemorrhage. Lateral ventricles and remaining midline structures are unremarkable. There are no acute extra-axial fluid collections. There is no mass effect. Vascular: No hyperdense vessel or unexpected calcification. Skull: Normal. Negative for fracture or focal lesion. Sinuses/Orbits: No acute finding. Other: None IMPRESSION: 1. Stable head CT, no acute intracranial process. Electronically Signed   By: Sharlet Salina M.D.   On: 06/28/2019 15:57   CT ANGIO NECK W OR WO CONTRAST  Result Date: 06/29/2019 CLINICAL DATA:  Stroke follow-up EXAM: CT ANGIOGRAPHY HEAD AND NECK TECHNIQUE: Multidetector CT imaging of the head and neck was performed using the standard protocol during bolus administration of intravenous contrast. Multiplanar CT image reconstructions and MIPs were obtained to evaluate the vascular anatomy. Carotid stenosis measurements (when applicable) are obtained utilizing NASCET criteria, using the distal internal carotid diameter as the denominator. CONTRAST:  Dose is currently not available COMPARISON:  None. FINDINGS: CTA NECK FINDINGS Aortic arch: Atherosclerotic plaque.  Two vessel branching. Right carotid system: Mild to moderate mainly calcified plaque at the bifurcation/bulb. No flow limiting stenosis or ulceration. Left carotid system: Atheromatous plaque of the distal common carotid which is mild for age. No flow limiting stenosis or ulceration. Vertebral arteries: Proximal subclavian atherosclerosis on the left more than right without flow limiting stenosis or ulceration. Codominant vertebral arteries that show  robust flow to the dura. Skeleton: Degenerative changes without acute or aggressive finding. Other neck: No evidence of mass or inflammation Upper chest: Ground-glass and streaky opacity in the right more than left lung. No opacification at the left atrial appendage. Review of the MIP images confirms the above findings CTA HEAD FINDINGS Anterior circulation: Atherosclerotic plaque on the carotid siphons. No branch occlusion or flow limiting stenosis. Negative for aneurysm Posterior circulation: Vertebral and basilar arteries are smooth and widely patent. No branch occlusion or aneurysm. Venous sinuses: Unremarkable Anatomic variants: None significant Review of the MIP images confirms the above findings IMPRESSION: 1. No emergent vascular finding 2. Atherosclerosis without flow limiting stenosis or ulceration in the head and neck. 3. Non opacified left atrial appendage in the setting of atrial fibrillation that could be delayed filling versus thrombus. 4. Right more than left pneumonia and atelectasis. Electronically Signed   By: Marnee Spring M.D.   On: 06/29/2019 10:29   MR BRAIN WO CONTRAST  Result Date: 06/30/2019 CLINICAL DATA:  Stroke follow-up EXAM: MRI HEAD WITHOUT CONTRAST TECHNIQUE: Multiplanar, multiecho pulse sequences of the brain and surrounding structures were obtained without intravenous contrast. COMPARISON:  Head CT June 28, 2019 FINDINGS: Brain: A focus of restricted diffusion is seen in the posterior aspect of the left superior frontal gyrus, consistent with an acute infarct. Area of encephalomalacia and gliosis is seen involving the posterior right insula extending into the right temporal lobe. Scattered and confluent foci of T2 hyperintensity are seen within the white matter of the cerebral hemispheres, nonspecific, most likely related to chronic small vessel ischemia. Prominence of the supratentorial ventricles, cerebral and cerebellar sulci reflecting parenchymal volume loss. No  hemorrhage, extra-axial collection or mass lesion. Vascular: Normal flow voids. Skull and upper cervical spine: Normal marrow signal. Sinuses/Orbits: Bilateral lens surgery noted. Visualized sinuses are clear.  Other: None. IMPRESSION: 1. Small acute infarct involving the posterior aspect of the left superior frontal gyrus. 2. Encephalomalacia and gliosis involving the posterior right insula extending into the right temporal lobe. 3. Chronic small vessel ischemia and parenchymal volume loss. Electronically Signed   By: Baldemar LenisKatyucia  De Macedo Rodrigues M.D.   On: 06/30/2019 13:32   CARDIAC CATHETERIZATION  Result Date: 06/28/2019  Previously placed Prox Cx to Mid Cx stent (unknown type) is widely patent -the lesion began just distal to the stent  CULPRIT LESION: Mid Cx lesion is 95% stenosed.  A drug-eluting stent was successfully placed in order to overlap the previously placed stent, using a STENT RESOLUTE ONYX 2.0X15. Postdilated to 2.3 mm - 2.1 mm  Post intervention, there is a 0% residual stenosis.  -----------------------------------  Ost RCA to Prox RCA lesion is 30% stenosed. Previously placed Prox RCA to Mid RCA stent (unknown type) is widely patent.  Mid LAD lesion is 30% stenosed.  -----------------------------------  There is moderate to severe left ventricular systolic dysfunction. The Left Ventricular Ejection Fraction is 25-35% by visual estimate.  LV end diastolic pressure is moderately elevated. There is mild (2+) mitral regurgitation.  SUMMARY  Severe single-vessel CAD with 95% stenosis in mid-distal LCx beyond previously placed stent  Successful DES PCI of LCx with stent overlapping proximal stent (resolute Onyx DES 2.0 mm x 15 mm--2.3 mm)  Otherwise mild to moderate 30% ostial and proximal RCA and mid LAD stenosis.  ACUTE COMBINED SYSTOLIC AND DIASTOLIC HEART FAILURE: Severely reduced LVEF with Takotsubo pattern, EF estimated 25 to 30%; Moderately elevated LVEDP of 20 mmHg   Persistent atrial fibrillation, rates notably improved following PCI  Borderline hypotension with improved blood pressures following PCI-no evidence of Cardiogenic Shock  Resolution of Right Bundle Branch Block  Evidence of neurologic recovery with the patient opening eyes and following commands while intubated. ->  Follows finger with eyes, squeezes hands. RECOMMENDATIONS  Based on bed availability, the patient will be transferred to [facility] (2 Heart).  With A. fib, will start IV heparin 8 hours after sheath removal and convert back to Pradaxa in the morning  Have ordered 2D echocardiogram, suspect reduced EF is partly related to stress cardiomyopathy from cardiac arrest as the reduced EF is well at proportion with her coronary disease.  Unclear if the patient has permanent A. fib versus paroxysmal, did not convert with defibrillation -> was on digoxin and high-dose metoprolol prior to arrival, neither have been ordered until the patient's blood pressure stabilized, would probably start with low-dose metoprolol in the morning.  Have not ordered other antihypertensives due to borderline blood pressures. The patient will be cared for under the [facility] service and upon discharge will be returned to the care of Dr. Jamse MeadAlex Paraschos. Bryan Lemmaavid Harding, MD  DG Chest Port 1 View  Result Date: 07/01/2019 CLINICAL DATA:  Shortness of breath.  CHF. EXAM: PORTABLE CHEST 1 VIEW COMPARISON:  06/30/2019 FINDINGS: Normal heart size. Probable small bilateral pleural effusions. No pneumothorax. Extremely low lung volumes. Perihilar and basilar predominant interstitial and airspace disease is felt to be slightly improved. IMPRESSION: Minimal improvement in aeration. Interstitial and airspace disease, at least partially felt to represent interstitial edema and atelectasis. Infection at the lung bases cannot be excluded. Electronically Signed   By: Jeronimo GreavesKyle  Talbot M.D.   On: 07/01/2019 09:42   DG Chest  Port 1 View  Result Date: 06/30/2019 CLINICAL DATA:  Tachypnea.  Recent stent placement EXAM: PORTABLE CHEST  1 VIEW COMPARISON:  Two days ago FINDINGS: The endotracheal tube is no longer seen. Lung volumes are lower and there is increased diffuse interstitial opacity. The enteric tube reaches the stomach. Stable heart size and mediastinal contours when allowing for overlapping defibrillator pad. No visible effusion or pneumothorax IMPRESSION: Low volume chest with increased interstitial opacity, likely edema and atelectasis. Electronically Signed   By: Marnee Spring M.D.   On: 06/30/2019 05:39   DG CHEST PORT 1 VIEW  Result Date: 06/28/2019 CLINICAL DATA:  Post cardiac arrest earlier today. EXAM: PORTABLE CHEST 1 VIEW COMPARISON:  Chest x-rays from earlier same day. FINDINGS: Endotracheal tube is well positioned with tip just above the level of the carina. Enteric tube passes below the diaphragm. Pacer pads overlie the heart. There is persistent central pulmonary vascular congestion and RIGHT suprahilar edema. Probable atelectasis at the LEFT lung base. No pleural effusion or pneumothorax is seen. IMPRESSION: 1. No significant change compared to today's earlier chest x-ray. Persistent central pulmonary vascular congestion and RIGHT suprahilar pulmonary edema. No pleural effusion or pneumothorax. 2. Endotracheal tube well positioned with tip just above the level of the carina. Electronically Signed   By: Bary Richard M.D.   On: 06/28/2019 20:53   DG Chest Portable 1 View  Result Date: 06/28/2019 CLINICAL DATA:  Status post arrest, intubated EXAM: PORTABLE CHEST 1 VIEW COMPARISON:  06/28/2019 FINDINGS: Single frontal view of the chest demonstrates defibrillator pads overlying left chest. Endotracheal tube overlies tracheal air column, tip just below thoracic inlet. Enteric catheter passes below diaphragm, tip excluded by collimation. Numerous cardiac leads overlie the chest. Cardiac silhouette is stable.  Since the prior exam, diffuse interstitial and ground-glass opacities have developed consistent with pulmonary edema. Small left pleural effusion is noted. There is no pneumothorax on this supine exam. No acute displaced fractures. IMPRESSION: 1. Support devices as above.  No evidence of malpositioning. 2. Interval development of mild pulmonary edema. Electronically Signed   By: Sharlet Salina M.D.   On: 06/28/2019 15:13   DG Abd Portable 1 View  Result Date: 06/28/2019 CLINICAL DATA:  Intubated, enteric catheter placement, arrest EXAM: PORTABLE ABDOMEN - 1 VIEW COMPARISON:  None. FINDINGS: Frontal view of the lower chest and upper abdomen demonstrates enteric catheter tip and side port projecting over gastric body. Numerous cardiac leads are identified. Defibrillator pads overlie the left chest. Bowel gas pattern is unremarkable. Interstitial and ground-glass opacities and small left pleural effusion consistent with pulmonary edema. IMPRESSION: 1. Enteric catheter overlying gastric body. 2. Pulmonary edema. Electronically Signed   By: Sharlet Salina M.D.   On: 06/28/2019 15:14   EEG adult  Result Date: 06/29/2019 Charlsie Quest, MD     06/29/2019  2:00 PM Patient Name: Courtney Chan MRN: [id number] Epilepsy Attending: Charlsie Quest Referring Physician/Provider: Dr. Marvel Plan Date: 06/29/2019 Duration: 25.15 minutes Patient history: 86 year old female with history of prior stroke now status post cardiac arrest and seizure-like activity.  EEG to evaluate for seizures. Level of alertness: Comatose AEDs during EEG study: Keppra, lorazepam Technical aspects: This EEG study was done with scalp electrodes positioned according to the 10-20 International system of electrode placement. Electrical activity was acquired at a sampling rate of 500Hz  and reviewed with a high frequency filter of 70Hz  and a low frequency filter of 1Hz . EEG data were recorded continuously and digitally stored. Description: EEG showed  continuous generalized low amplitude 2 to 3 Hz delta slowing as well as intermittent 5 to 6 Hz  generalized theta slowing.  EEG was reactive to noxious stimuli.  Hyperventilation and photic stimulation were not performed. Abnormality -Continuous slow, generalized IMPRESSION: This study is suggestive of severe diffuse encephalopathy, nonspecific etiology. No seizures or epileptiform discharges were seen throughout the recording. Lora Havens   ECHOCARDIOGRAM COMPLETE  Result Date: 06/29/2019    ECHOCARDIOGRAM REPORT   Patient Name:   Courtney Chan Date of Exam: 06/29/2019 Medical Rec #:  [id number]        Height:       64.0 in Accession #:    [id number]       Weight:       157.2 lb Date of Birth:  1930/12/15         BSA:          1.77 m Patient Age:    12 years         BP:           95/52 mmHg Patient Gender: F                HR:           86 bpm. Exam Location:  Inpatient Procedure: 2D Echo Indications:    Acute myocardial infarction 410  History:        Patient has no prior history of Echocardiogram examinations.                 CAD, Stroke, Arrythmias:Atrial Fibrillation; Risk                 Factors:Hypertension and Dyslipidemia. Chronic kidney disease.                 V. fib arrest. Awake and altert while intubated, when extubated                 altered mental status.  Sonographer:    Darlina Sicilian RDCS Referring Phys: [id number] [place]  1. Left ventricular ejection fraction, by estimation, is 55 to 60%. The left ventricle has normal function. The left ventricle has no regional wall motion abnormalities. Left ventricular diastolic function could not be evaluated.  2. Right ventricular systolic function is normal. The right ventricular size is normal. There is mildly elevated pulmonary artery systolic pressure.  3. The mitral valve is degenerative. Mild mitral valve regurgitation. No evidence of mitral stenosis.  4. Tricuspid valve regurgitation is mild to moderate.  5. The aortic  valve is tricuspid. Aortic valve regurgitation is not visualized. Mild to moderate aortic valve sclerosis/calcification is present, without any evidence of aortic stenosis. Comparison(s): No prior Echocardiogram. FINDINGS  Left Ventricle: Left ventricular ejection fraction, by estimation, is 55 to 60%. The left ventricle has normal function. The left ventricle has no regional wall motion abnormalities. There is no left ventricular hypertrophy. The left ventricular diastology could not be evaluated due to atrial fibrillation. Left ventricular diastolic function could not be evaluated. Right Ventricle: The right ventricular size is normal. No increase in right ventricular wall thickness. Right ventricular systolic function is normal. There is mildly elevated pulmonary artery systolic pressure. Left Atrium: Left atrial size was normal in size. Right Atrium: Right atrial size was normal in size. Pericardium: There is no evidence of pericardial effusion. Presence of pericardial fat pad. Mitral Valve: The mitral valve is degenerative in appearance. Severe mitral annular calcification. Mild mitral valve regurgitation. No evidence of mitral valve stenosis. Tricuspid Valve: The tricuspid valve is grossly normal. Tricuspid valve regurgitation is mild to moderate.  Aortic Valve: The aortic valve is tricuspid. . There is moderate thickening and moderate calcification of the aortic valve. Aortic valve regurgitation is not visualized. Mild to moderate aortic valve sclerosis/calcification is present, without any evidence of aortic stenosis. There is moderate thickening of the aortic valve. There is moderate calcification of the aortic valve. Pulmonic Valve: The pulmonic valve was grossly normal. Pulmonic valve regurgitation is trivial. Aorta: The aortic root and ascending aorta are structurally normal, with no evidence of dilitation. Venous: IVC assessment for right atrial pressure unable to be performed due to mechanical  ventilation. IAS/Shunts: No atrial level shunt detected by color flow Doppler.  LEFT VENTRICLE PLAX 2D LVIDd:         3.23 cm  Diastology LVIDs:         1.70 cm  LV e' lateral:   5.84 cm/s LV PW:         1.11 cm  LV E/e' lateral: 18.2 LV IVS:        1.12 cm  LV e' medial:    6.20 cm/s LVOT diam:     1.80 cm  LV E/e' medial:  17.2 LV SV:         30.79 ml LV SV Index:   18.50 LVOT Area:     2.54 cm  RIGHT VENTRICLE RVOT diam:      2.20 cm TAPSE (M-mode): 1.0 cm LEFT ATRIUM             Index       RIGHT ATRIUM           Index LA diam:        3.30 cm 1.87 cm/m  RA Area:     20.50 cm LA Vol (A2C):   43.3 ml 24.52 ml/m RA Volume:   56.80 ml  32.16 ml/m LA Vol (A4C):   49.1 ml 27.80 ml/m LA Biplane Vol: 46.9 ml 26.56 ml/m  AORTIC VALVE LVOT Vmax:   64.30 cm/s LVOT Vmean:  40.800 cm/s LVOT VTI:    0.121 m  AORTA Ao Root diam: 2.70 cm Ao Asc diam:  2.80 cm MITRAL VALVE                TRICUSPID VALVE MV Area (PHT): 5.32 cm     TR Peak grad:   30.2 mmHg MV Decel Time: 143 msec     TR Vmax:        275.00 cm/s MV E velocity: 106.33 cm/s                             SHUNTS                             Systemic VTI:  0.12 m                             Systemic Diam: 1.80 cm                             Pulmonic Diam: 2.20 cm Lennie Odor MD Electronically signed by Lennie Odor MD Signature Date/Time: 06/29/2019/3:24:25 PM    Final    CT HEAD CODE STROKE WO CONTRAST  Result Date: 06/29/2019 CLINICAL DATA:  Code stroke.  Change in mental status EXAM: CT HEAD WITHOUT CONTRAST TECHNIQUE: Contiguous axial images were obtained  from the base of the skull through the vertex without intravenous contrast. COMPARISON:  Head CT from yesterday FINDINGS: Brain: No evidence of acute infarction, hemorrhage, hydrocephalus, extra-axial collection or mass lesion/mass effect. Remote right superior temporal cortically based infarct. Brain atrophy with ventriculomegaly. Chronic small vessel ischemia in the cerebral white matter. Vascular: No  hyperdense vessel. Skull: No acute or aggressive finding Sinuses/Orbits: Negative Other: These results were communicated to Xu at 10:07 amon 2/15/2021by text page via the [facility] messaging system. ASPECTS [facility] Stroke Program Early CT Score) Not scored without localizing symptoms. IMPRESSION: 1. No acute finding. 2. Atrophy, chronic small vessel ischemia, and small remote right superior temporal infarct. Electronically Signed   By: Marnee Spring M.D.   On: 06/29/2019 10:08    Labs:  Basic Metabolic Panel: Recent Labs  Lab 07/06/19 0554  NA 139  K 4.1  CL 102  CO2 24  GLUCOSE 100*  BUN 20  CREATININE 0.94  CALCIUM 9.0    CBC: Recent Labs  Lab 07/06/19 0554  WBC 10.8*  NEUTROABS 7.5  HGB 11.6*  HCT 36.6  MCV 95.1  PLT 364    CBG: No results for input(s): GLUCAP in the last 168 hours.  Family history.  Maternal grandmother with breast cancer.  Denies any hypertension hyperlipidemia or colon cancer  Brief HPI:   Courtney Chan is a 86 y.o. right-handed female with history of atrial fibrillation followed by Dr.Paraschos at [facility] clinic, hyperlipidemia, hypertension, CKD with creatinine 1.05-1.18, CAD with PCI.  Patient lives with spouse independent prior to admission noted some recent cognitive decline.  Presented 06/28/2019 with chest pain at [facility].  She was in the process of waiting in the waiting room when she became unresponsive and pulseless.  Appeared to be in ventricular tachycardia.  She did undergo defibrillation x1 with additional 3 to 4 minutes of CPR.  She is found to have non-STEMI with PCI completed per cardiology services.  On 06/29/2019 after extubation altered mental status not following commands.  MRI showed small acute infarction.  Per report small acute infarct involving the posterior aspect of the left superior frontal gyrus.  CT angiogram of head and neck with no emergent vascular findings.  Echocardiogram with ejection fraction of 60% without emboli.  EEG  negative for seizures.  Neurology follow-up maintained on Plavix as well as Pradaxa.  Therapy evaluations completed and patient was admitted for a comprehensive rehab program.   Hospital Course: Courtney Chan was admitted to rehab 07/04/2019 for inpatient therapies to consist of PT, ST and OT at least three hours five days a week. Past admission physiatrist, therapy team and rehab RN have worked together to provide customized collaborative inpatient rehab.  Pertaining to patient's small left MCA frontal gyrus infarction in the setting of post cardiac arrest remained stable she would follow-up with neurology services she remained on Pradaxa as well as Plavix.  Blood pressure cardiac rate remained controlled she remained on Toprol as well as Cozaar.  She exhibited no signs of fluid overload with Lasix and Cozaar ongoing.  Synthroid for hypothyroidism.  Lipitor for hyperlipidemia.  Creatinine remained stable 0.94.   Blood pressures were monitored on TID basis and controlled  Courtney Chan is continent of bowel and bladder.  Courtney Chan has made gains during rehab stay and is attending therapies  Courtney Chan will continue to receive follow up therapies after discharge  Rehab course: During patient's stay in rehab weekly team conferences were held to monitor patient's progress, set goals and discuss  barriers to discharge. At admission, patient required minimal assist sit to stand moderate assist stand pivot transfers min mod assist 60 feet 1 person hand-held assistance.  Minimal assist upper body bathing minimal assist lower body bathing minimal assist upper body dressing minimal assist lower body dressing  Physical exam.  Blood pressure 114/65 pulse 101 temperature 98.2 respiration 16 oxygen saturation 96% room air Constitutional.  Well-developed well-nourished HEENT Head.  Normocephalic and atraumatic Eyes.  Pupils round and reactive to light no discharge without nystagmus Neck.  Supple nontender no JVD without  thyromegaly Cardiac regular rate rhythm without any extra sounds or murmur heard Abdomen.  Soft nontender positive bowel sounds without rebound Neurological.  Alert no acute distress makes good eye contact with examiner follows commands fair awareness of deficits motor strength 4 - 4 out of 5 throughout.  Courtney Chan  has had improvement in activity tolerance, balance, postural control as well as ability to compensate for deficits. Courtney Chan has had improvement in functional use RUE/LUE  and RLE/LLE as well as improvement in awareness.  Working with energy conservation techniques.  Transfers with supervision multiple trials ambulates 150 feet rolling walker supervision.  Gathers her belongings for activities daily and homemaking.  Was advised need for supervision for safety.  Full family teaching completed plan discharge to home       Disposition: Discharge to home    Diet: Regular  Special Instructions: No driving smoking or alcohol  Medications at discharge. 1.  Tylenol as needed 2.  Lipitor 80 mg daily 3.  Plavix 75 mg p.o. daily 4.  Pradaxa 150 mg every 12 hours 5.  Lasix 20 mg p.o. daily 6.  Synthroid 88 mcg p.o. daily 7.  Lidoderm patch change as directed 8.  Cozaar 12.5 mg p.o. daily 9.  Toprol-XL 150 mg p.o. daily 10.  Tramadol 50 mg every 6 hours as needed pain Discharge Instructions    Ambulatory referral to Neurology   Complete by: As directed    An appointment is requested in approximately 4 weeks left MCA infarction      Follow-up Information    Ranelle Oyster, MD Follow up.   Specialty: Physical Medicine and Rehabilitation Why: Office to call for appointment Contact information: [address] [place] [address] [phone]        Marcina Millard, MD Follow up.   Specialty: Cardiology Why: Call for appointment Contact information: [address] [facility] [place] [address] [phone]            Signed: Mcarthur Rossetti Tyr Franca 07/10/2019, 10:02 AM

## 2019-07-10 NOTE — Progress Notes (Addendum)
Occupational Therapy Session Note  Patient Details  Name: Courtney Chan MRN: [id number] Date of Birth: [birth year]  Today's Date: 07/10/2019 Session 1 OT Individual Time: 9295-7473 OT Individual Time Calculation (min): 71 min   Session2 OT Individual Time: 4037-0964 OT Individual Time Calculation (min): 57 min   Short Term Goals: Week 1:  OT Short Term Goal 1 (Week 1): STGs=LTGs due to ELOS  Skilled Therapeutic Interventions/Progress Updates:  Session 1   Pt greeted semi-reclined in bed and agreeable to OT treatment session. Pt reported she had a good night, but needed to go to the bathroom. Pt needed supervision with verbal cues for hand placement with bed mobility. Pt ambulated to the bathroom without AD and close supervision. Pt voided bladder and completed all toileting tasks supervision. Pt maintained standing at the sink for grooming tasks, then sat to don clean shirt. Pt declined to don shoes for ambulation, despite encouragement. Pt ambulated to therapy gym with close supervision/CGA without AD. UB there-ex with 5 mins forward, and 5 mins backwards on [employer] arm bike for UB there-ex. Worked on balance strategies with heel cord stretch standing on foam wedge while completed dynavision activity. Close supervision with intermittent CGA for balance. Pt ambulated back to room and was left seated in recliner with chair alarm on, call bell in reach, and needs met.  Session 2 OT treatment session focused on pt/family education. Pt's spouse Juanda Crumble present for family ed. Pt ambulated to therapy gym with RW and supervision. Educated pt's spouse on providing assist for stairs since her handrails will likely not yet be installed. Practice 2 options for stair negotiation to find safest option for pt and spouse. Pt and spouse demonstrated competency. Pt then ambulated with her husband providing supervision to therapy gym. OT set-up walk-in shower transfer in simulated home environment and practice  safe entry and exit to shower with shower seat. OT educated on deep breathing techniques and importance of taking deep, full breaths. OT also reviewed UB there-ex for chest soreness. OT provided pt with RW bag and practiced ambulating in gym using RW bag to collect items. OT provided pt with home fine motor program and discussed affects of stroke and stroke recovery. Pt ambulated back to room at end of session with RW and supervision. Pt left seated in recliner with chair alarm on, call ball in reach, and spouse present.   Therapy Documentation Precautions:  Precautions Precautions: Fall Precaution Comments: chest pain from compressions Restrictions Weight Bearing Restrictions: No Pain: Pain Assessment Pain Scale: 0-10 Pain Score: 8 Pain Type: Acute pain Pain Location: Chest Pain Orientation: Mid Pain Descriptors / Indicators: Aching;Sore Pain Onset: With Activity Pain Intervention(s): Repositioned  Therapy/Group: Individual Therapy  Valma Cava 07/10/2019, 3:31 PM

## 2019-07-10 NOTE — Progress Notes (Signed)
Social Work Patient ID: Courtney Chan, female   DOB: 08/29/1930, 86 y.o.   MRN: [id number]    SW met with pt and pt husband in room to discuss discharge recommendations with regard to Outpatient PT/OT and DME: shower chair. SW ordered shower chair through [employer]. Preferred OPT location is [facility], and [facility].   *SW informed pt that a referral was sent to [facility] (p:[phone]/f:[phone]) and waiting on follow-up. SW informed pt SW will call when returning to office on Monday to inform if referral was accepted. SW provided contact information for DME: [employer] and [facility]. SW informed cost for shower chair will be $45 +tax. Pt reported that DME was delivered by [employer] and paid for already.   Loralee Pacas, MSW, [place] Office: [phone] Cell: [phone] Fax: [phone]

## 2019-07-10 NOTE — Progress Notes (Signed)
[place] PHYSICAL MEDICINE & REHABILITATION PROGRESS NOTE  Subjective/Complaints: Has kpad on chest. Anxious for sternal pain to improve. Excited that she can go home this weekend.   ROS: Patient denies fever, rash, sore throat, blurred vision, nausea, vomiting, diarrhea, cough, shortness of breath or chest pain,   headache, or mood change. .    Objective: Vital Signs: Blood pressure 122/74, pulse 85, temperature 98.1 F (36.7 C), resp. rate 18, height 5' 3.5" (1.613 m), weight 66.6 kg, SpO2 97 %. No results found. No results for input(s): WBC, HGB, HCT, PLT in the last 72 hours. No results for input(s): NA, K, CL, CO2, GLUCOSE, BUN, CREATININE, CALCIUM in the last 72 hours.  Physical Exam: BP 122/74 (BP Location: Right Arm)   Pulse 85   Temp 98.1 F (36.7 C)   Resp 18   Ht 5' 3.5" (1.613 m)   Wt 66.6 kg   SpO2 97%   BMI 25.60 kg/m  Constitutional: No distress . Vital signs reviewed. HEENT: EOMI, oral membranes moist Neck: supple Cardiovascular: IRR without murmur. No JVD    Respiratory/Chest: CTA Bilaterally without wheezes or rales. Normal effort, sternum tender. Able to take deeper breaths    GI/Abdomen: BS +, non-tender, non-distended Ext: no edema Skin: Warm and dry.  Intact. Psych: pleasant Musc: No edema in extremities.  No tenderness in extremities.  Neurological: alert and oriented. Good insight and awareness. No CN findings--Motor 4+/5. Normal sensation   Assessment/Plan: 1. Functional deficits secondary to debility which require 3+ hours per day of interdisciplinary therapy in a comprehensive inpatient rehab setting.  Physiatrist is providing close team supervision and 24 hour management of active medical problems listed below.  Physiatrist and rehab team continue to assess barriers to discharge/monitor patient progress toward functional and medical goals  Care Tool:  Bathing    Body parts bathed by patient: Right arm, Left arm, Chest, Abdomen, Front  perineal area, Buttocks, Right upper leg, Left upper leg, Left lower leg, Right lower leg, Face         Bathing assist Assist Level: Contact Guard/Touching assist     Upper Body Dressing/Undressing Upper body dressing   What is the patient wearing?: Pull over shirt    Upper body assist Assist Level: Set up assist    Lower Body Dressing/Undressing Lower body dressing      What is the patient wearing?: Pants     Lower body assist Assist for lower body dressing: Independent with assitive device     Toileting Toileting Toileting Activity did not occur (Clothing management and hygiene only): N/A (no void or bm)  Toileting assist Assist for toileting: Moderate Assistance - Patient 50 - 74%     Transfers Chair/bed transfer  Transfers assist     Chair/bed transfer assist level: Supervision/Verbal cueing     Locomotion Ambulation   Ambulation assist      Assist level: Supervision/Verbal cueing Assistive device: Walker-rolling Max distance: 150   Walk 10 feet activity   Assist     Assist level: Supervision/Verbal cueing Assistive device: Walker-rolling   Walk 50 feet activity   Assist    Assist level: Supervision/Verbal cueing Assistive device: Walker-rolling    Walk 150 feet activity   Assist Walk 150 feet activity did not occur: Safety/medical concerns  Assist level: Supervision/Verbal cueing Assistive device: Walker-rolling    Walk 10 feet on uneven surface  activity   Assist     Assist level: Minimal Assistance - Patient > 75% Assistive device: Other (  comment)(railing as needed)   Wheelchair     Assist Will patient use wheelchair at discharge?: No(anticipate pt will be a functional ambulator)             Wheelchair 50 feet with 2 turns activity    Assist            Wheelchair 150 feet activity     Assist         Medical Problem List and Plan: 1.  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 fib  -Continue CIR therapies including PT, OT, and SLP  -progressing toward goals quickly  -ELOS moved up to 07/12/19 2.  Antithrombotics: -DVT/anticoagulation: Pradaxa 150 mg every 12 hours             -antiplatelet therapy: Plavix 75 mg daily 3. Pain Management: Lidoderm patch, Ultram as needed. Pain is currently well controlled.  4. Mood: Provide emotional support              -antipsychotic agents: N/A 5. Neuropsych: This patient is capable of making decisions on her own behalf. 6. Skin/Wound Care: Routine skin checks 7. Fluids/Electrolytes/Nutrition: encourage po   -protein supp for low albumin   -recheck bmet in AM 8.  Atrial fibrillation.  Continue Pradaxa.   Continue Toprol 100 mg daily             HR controlled.   9.  Diastolic congestive heart failure.  Lasix 20 mg daily as well as Cozaar 25 mg daily.  2/26--weights have been stable Filed Weights   07/07/19 0600 07/08/19 0446 07/09/19 0503  Weight: 68.4 kg 67.6 kg 66.6 kg  10.  Hypothyroidism.    Continue Synthroid 11.  Hyperlipidemia Lipitor 12.  CKD.  Creatinine baseline 1.05-1.18.               2/22 Cr 0.94 13. Blood tinged sputum:  -no change  -HGB up to 11.6 2/22  -likely d/t repeated coughing, pradaxa/plavix.  chest also sore from CPR  -added mucinex dm, MMW w/ lidocaine and chlorseptic spray  -lidocaine, ice/heat to sternum, provides some relief.   -reassured pt that this will gradually improve  -recheck cbc in am   LOS: 6 days A FACE TO FACE EVALUATION WAS PERFORMED  Meredith Staggers 07/10/2019, 1:10 PM

## 2019-07-11 ENCOUNTER — Inpatient Hospital Stay (HOSPITAL_COMMUNITY): Payer: Medicare PPO | Admitting: Occupational Therapy

## 2019-07-11 ENCOUNTER — Inpatient Hospital Stay (HOSPITAL_COMMUNITY): Payer: Medicare PPO | Admitting: Physical Therapy

## 2019-07-11 LAB — CBC
HCT: 36 % (ref 36.0–46.0)
Hemoglobin: 11.2 g/dL — ABNORMAL LOW (ref 12.0–15.0)
MCH: 30.3 pg (ref 26.0–34.0)
MCHC: 31.1 g/dL (ref 30.0–36.0)
MCV: 97.3 fL (ref 80.0–100.0)
Platelets: 397 10*3/uL (ref 150–400)
RBC: 3.7 MIL/uL — ABNORMAL LOW (ref 3.87–5.11)
RDW: 15 % (ref 11.5–15.5)
WBC: 10.2 10*3/uL (ref 4.0–10.5)
nRBC: 0 % (ref 0.0–0.2)

## 2019-07-11 LAB — BASIC METABOLIC PANEL
Anion gap: 5 (ref 5–15)
BUN: 22 mg/dL (ref 8–23)
CO2: 28 mmol/L (ref 22–32)
Calcium: 8.6 mg/dL — ABNORMAL LOW (ref 8.9–10.3)
Chloride: 105 mmol/L (ref 98–111)
Creatinine, Ser: 1.1 mg/dL — ABNORMAL HIGH (ref 0.44–1.00)
GFR calc Af Amer: 53 mL/min — ABNORMAL LOW (ref >60)
GFR calc non Af Amer: 45 mL/min — ABNORMAL LOW (ref >60)
Glucose, Bld: 97 mg/dL (ref 70–99)
Potassium: 4.5 mmol/L (ref 3.5–5.1)
Sodium: 138 mmol/L (ref 135–145)

## 2019-07-11 NOTE — Plan of Care (Signed)
  Problem: RH BOWEL ELIMINATION Goal: RH STG MANAGE BOWEL WITH ASSISTANCE Description: STG Manage Bowel with mod I Assistance. Outcome: Progressing Goal: RH STG MANAGE BOWEL W/MEDICATION W/ASSISTANCE Description: STG Manage Bowel with Medication with mod I Assistance. Outcome: Progressing   Problem: RH BLADDER ELIMINATION Goal: RH STG MANAGE BLADDER WITH ASSISTANCE Description: STG Manage Bladder With mod I Assistance Outcome: Progressing   Problem: RH SKIN INTEGRITY Goal: RH STG MAINTAIN SKIN INTEGRITY WITH ASSISTANCE Description: STG Maintain Skin Integrity With mod I Assistance. Outcome: Progressing   Problem: RH PAIN MANAGEMENT Goal: RH STG PAIN MANAGED AT OR BELOW PT'S PAIN GOAL Description: Less than 3 out of 10 Outcome: Progressing   Problem: RH KNOWLEDGE DEFICIT Goal: RH STG INCREASE KNOWLEDGE OF HYPERTENSION Description: Patient will identify 2 blood pressure medications she is taking and indication Outcome: Progressing Goal: RH STG INCREASE KNOWLEGDE OF HYPERLIPIDEMIA Description: Patient will identify which medication on for HLD and 1 complimentary intervention Outcome: Progressing Goal: RH STG INCREASE KNOWLEDGE OF STROKE PROPHYLAXIS Outcome: Progressing   Problem: RH Vision Goal: RH LTG Vision (Specify) Outcome: Progressing   Problem: RH Pre-functional/Other (Specify) Goal: RH LTG Pre-functional (Specify) Outcome: Progressing Goal: RH LTG Interdisciplinary (Specify) 1 Description: RH LTG Interdisciplinary (Specify)1 Outcome: Progressing Goal: RH LTG Interdisciplinary (Specify) 2 Description: RH LTG Interdisciplinary (Specify) 2  Outcome: Progressing

## 2019-07-11 NOTE — Progress Notes (Signed)
Physical Therapy Session Note  Patient Details  Name: Courtney Chan MRN: [id number] Date of Birth: 1930/11/23  Today's Date: 07/11/2019 PT Individual Time: 1010-1105 PT Individual Time Calculation (min): 55 min   Short Term Goals: Week 1:  PT Short Term Goal 1 (Week 1): = to LTGs based on ELOS  Skilled Therapeutic Interventions/Progress Updates:   Pt received sitting in WC and agreeable to PT. Gait training through hall 4 x 118f with RW, no cues or assist from PT required  5xSTS 10.5 sec.  TUG without AD 10.3 sec ( average of 3 trials 11, 11, 9)  Stair management training without AD x 12 steps and supervision assist for safety and decreased speed. Noted intermittent steadying with back on hand on rail with descent, but no assist required.   Car management training without AD and wihtout assist or cues from PT assist. Dynamic gait training up/down ramp and over mulch without AD or cues from PT.   PT instructed pt in Modified [place] level A balance HEP with hand out provided. Forward/revere walking 2x 180f  Side stepping R and L 2 x 10 steps.  Mini squats with UE support x 10  Figure 8 walking x 2 with targets at 6 ft apart.  Tandem stance 2 x 10 sec BLE. And BUE support   SLS 2 x sec with BUe support  Sit<>stand x 10 with push from thighs Supervision assist throughout with min cues for increased step length and proper UE support when needed, as well as ways to advance difficulty as needed for each exercise.   [facility] and all needs met.             Therapy Documentation Precautions:  Precautions Precautions: Fall Precaution Comments: chest pain from compressions Restrictions Weight Bearing Restrictions: No Pain: Pain Assessment Pain Scale: 0-10 Pain Score: 2     Locomotion : Gait Ambulation: Yes Gait Assistance: Independent with assistive device Gait Distance (Feet): 150 Feet Assistive device:  Rolling walker Gait Gait: Yes Gait Pattern: Impaired Gait Pattern: Wide base of support Gait velocity: WFL Stairs / Additional Locomotion Stairs: Yes Stairs Assistance: Supervision/Verbal cueing Stair Management Technique: No rails Number of Stairs: 12 Height of Stairs: 6 Curb: Supervision/Verbal cueing Wheelchair Mobility Wheelchair Mobility: No     Therapy/Group: Individual Therapy  AuLorie Phenix/27/2021, 11:08 AM

## 2019-07-11 NOTE — Progress Notes (Signed)
[place] PHYSICAL MEDICINE & REHABILITATION PROGRESS NOTE  Subjective/Complaints: Gradually improving. Up with PT early today. Working through chest soreness  ROS: Patient denies fever, rash, sore throat, blurred vision, nausea, vomiting, diarrhea, cough, shortness of breath    or back pain, headache, or mood change.  .    Objective: Vital Signs: Blood pressure 120/76, pulse 74, temperature 97.9 F (36.6 C), temperature source Oral, resp. rate 19, height 5' 3.5" (1.613 m), weight 67.3 kg, SpO2 97 %. No results found. Recent Labs    07/11/19 0601  WBC 10.2  HGB 11.2*  HCT 36.0  PLT 397   Recent Labs    07/11/19 0601  NA 138  K 4.5  CL 105  CO2 28  GLUCOSE 97  BUN 22  CREATININE 1.10*  CALCIUM 8.6*    Physical Exam: BP 120/76 (BP Location: Left Arm)   Pulse 74   Temp 97.9 F (36.6 C) (Oral)   Resp 19   Ht 5' 3.5" (1.613 m)   Wt 67.3 kg   SpO2 97%   BMI 25.87 kg/m  Constitutional: No distress . Vital signs reviewed. HEENT: EOMI, oral membranes moist Neck: supple Cardiovascular: RRR without murmur. No JVD    Respiratory/Chest: CTA Bilaterally without wheezes or rales. Normal effort, sternal tenderness   GI/Abdomen: BS +, non-tender, non-distended Ext: no clubbing, cyanosis, or edema Skin: Warm and dry.  Intact. Psych: pleasant Musc: No edema in extremities.  No tenderness in extremities.  Neurological: oriented x 3. Good insight and awareness. No CN findings--Motor 4+/5. Normal sensation   Assessment/Plan: 1. Functional deficits secondary to debility which require 3+ hours per day of interdisciplinary therapy in a comprehensive inpatient rehab setting.  Physiatrist is providing close team supervision and 24 hour management of active medical problems listed below.  Physiatrist and rehab team continue to assess barriers to discharge/monitor patient progress toward functional and medical goals  Care Tool:  Bathing    Body parts bathed by patient: Right  arm, Left arm, Chest, Abdomen, Front perineal area, Buttocks, Right upper leg, Left upper leg, Left lower leg, Right lower leg, Face         Bathing assist Assist Level: Contact Guard/Touching assist     Upper Body Dressing/Undressing Upper body dressing   What is the patient wearing?: Pull over shirt    Upper body assist Assist Level: Set up assist    Lower Body Dressing/Undressing Lower body dressing      What is the patient wearing?: Pants     Lower body assist Assist for lower body dressing: Independent with assitive device     Toileting Toileting Toileting Activity did not occur (Clothing management and hygiene only): N/A (no void or bm)  Toileting assist Assist for toileting: Moderate Assistance - Patient 50 - 74%     Transfers Chair/bed transfer  Transfers assist     Chair/bed transfer assist level: Independent with assistive device     Locomotion Ambulation   Ambulation assist      Assist level: Independent with assistive device Assistive device: Walker-rolling Max distance: 150   Walk 10 feet activity   Assist     Assist level: Independent with assistive device Assistive device: Walker-rolling   Walk 50 feet activity   Assist    Assist level: Independent with assistive device Assistive device: Walker-rolling    Walk 150 feet activity   Assist Walk 150 feet activity did not occur: Safety/medical concerns  Assist level: Independent with assistive device Assistive device: Walker-rolling  Walk 10 feet on uneven surface  activity   Assist     Assist level: Independent with assistive device Assistive device: Other (comment)(railing as needed)   Wheelchair     Assist Will patient use wheelchair at discharge?: No   Wheelchair activity did not occur: N/A         Wheelchair 50 feet with 2 turns activity    Assist    Wheelchair 50 feet with 2 turns activity did not occur: N/A       Wheelchair 150 feet  activity     Assist Wheelchair 150 feet activity did not occur: N/A       Medical Problem List and Plan: 1.  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 fib  -Continue CIR therapies including PT, OT, and SLP  -progressing toward goals quickly  -ELOS moved up to 07/12/19  Patient to see Dr. Ranell Patrick in the office for transitional care encounter in 1-2 weeks.  2.  Antithrombotics: -DVT/anticoagulation: Pradaxa 150 mg every 12 hours             -antiplatelet therapy: Plavix 75 mg daily 3. Pain Management: Lidoderm patch, Ultram as needed. Pain is currently well controlled.  4. Mood: Provide emotional support              -antipsychotic agents: N/A 5. Neuropsych: This patient is capable of making decisions on her own behalf. 6. Skin/Wound Care: Routine skin checks 7. Fluids/Electrolytes/Nutrition: encourage po   -protein supp for low albumin   -recheck bmet in AM 8.  Atrial fibrillation.  Continue Pradaxa.   Continue Toprol 100 mg daily             HR controlled.   9.  Diastolic congestive heart failure.  Lasix 20 mg daily as well as Cozaar 25 mg daily.  2/27--weights have been stable Filed Weights   07/08/19 0446 07/09/19 0503 07/11/19 0547  Weight: 67.6 kg 66.6 kg 67.3 kg  10.  Hypothyroidism.    Continue Synthroid 11.  Hyperlipidemia Lipitor 12.  CKD.  Creatinine baseline 1.05-1.18.               2/22 Cr 0.94  2/27 Cr 1.1 13. Blood tinged sputum:  -no change  -HGB up to 11.6 2/22  -likely d/t repeated coughing, pradaxa/plavix.  chest also sore from CPR  -added mucinex dm, MMW w/ lidocaine and chlorseptic spray  -lidocaine, ice/heat to sternum, provides some relief.   -reassured pt that this will gradually improve  2/27 -I personally reviewed the patient's labs today.  Hgb stable at 11.2   LOS: 7 days A FACE TO FACE EVALUATION WAS  PERFORMED  Meredith Staggers 07/11/2019, 12:34 PM

## 2019-07-11 NOTE — Progress Notes (Signed)
Occupational Therapy Discharge Summary  Patient Details  Name: Courtney Chan MRN: [id number] Date of Birth: 1930-09-06  Today's Date: 07/11/2019 OT Individual Time: 1435-1535 OT Individual Time Calculation (min): 60 min   Patient has met 8 of 9 long term goals due to improved activity tolerance, improved balance, postural control, ability to compensate for deficits and improved coordination.  Patient to discharge at overall Supervision level.  Patient's spouse, Juanda Crumble, has attended hands on family education and is independent to provide the necessary assistance at discharge.    Pt still requires CGA for bathing and therefore bathing goal not met.   Recommendation:  Patient will benefit from ongoing skilled OT services in outpatient setting to continue to advance functional skills in the area of iADL.  Equipment: Shower chair  Reasons for discharge: treatment goals met and discharge from hospital  Patient/family agrees with progress made and goals achieved: Yes   Skilled Therapeutic Intervention:  Pt greeted in the recliner with ADL needs met and premedicated for pain. Started with community mobility, pt ambulating with RW to the central elevators and then ambulating around the hospital atrium with supervision. She transferred to a booth in the food court and was able to scoot down before transferring out of it. Pt ambulated outdoors as well to look at the paisley flowers. Discussed having raised beds at home to continue with this meaningful occupatinon of hers (flower gardening). We also discussed importance of removing throw rugs at home, as she needed CGA to navigate over rugs in front of exit doors leading outside. When pt returned to the unit, she engaged in bedmaking tasks without device to work on higher level balance, bending outside of base of support to apply sheets and blankets onto bed. She doffed/donned pillowcases while seated to conserve energy. At end of session pt remained  in the recliner with all needs within reach.    OT Discharge ADL ADL Eating: Independent Grooming: Setup Upper Body Bathing: Setup Lower Body Bathing: Contact guard Upper Body Dressing: Setup Lower Body Dressing: Setup Toileting: Supervision/safety Toilet Transfer: Modified independent Social research officer, government: Close supervision Cognition Orientation Level: Oriented X4 Safety/Judgment: Appears intact Balance Balance Balance Assessed: Yes Dynamic Sitting Balance Dynamic Sitting - Balance Support: Feet supported;During functional activity Dynamic Sitting - Level of Assistance: 7: Independent(doffing/donning gripper socks) Dynamic Standing Balance Dynamic Standing - Balance Support: During functional activity;No upper extremity supported Dynamic Standing - Level of Assistance: 6: Modified independent (Device/Increase time)(toilet transfer) Extremity/Trunk Assessment RUE Assessment RUE Assessment: Within Functional Limits LUE Assessment LUE Assessment: Within Functional Limits   Yariah Selvey A Giancarlos Berendt 07/11/2019, 3:59 PM

## 2019-07-12 NOTE — Plan of Care (Signed)
  Problem: Consults Goal: RH STROKE PATIENT EDUCATION Description: See Patient Education module for education specifics  Outcome: Progressing   Problem: RH BOWEL ELIMINATION Goal: RH STG MANAGE BOWEL WITH ASSISTANCE Description: STG Manage Bowel with mod I Assistance. Outcome: Progressing Goal: RH STG MANAGE BOWEL W/MEDICATION W/ASSISTANCE Description: STG Manage Bowel with Medication with mod I Assistance. Outcome: Progressing   Problem: RH BLADDER ELIMINATION Goal: RH STG MANAGE BLADDER WITH ASSISTANCE Description: STG Manage Bladder With mod I Assistance Outcome: Progressing   Problem: RH SKIN INTEGRITY Goal: RH STG MAINTAIN SKIN INTEGRITY WITH ASSISTANCE Description: STG Maintain Skin Integrity With mod I Assistance. Outcome: Progressing   Problem: RH PAIN MANAGEMENT Goal: RH STG PAIN MANAGED AT OR BELOW PT'S PAIN GOAL Description: Less than 3 out of 10 Outcome: Progressing   Problem: RH KNOWLEDGE DEFICIT Goal: RH STG INCREASE KNOWLEDGE OF HYPERTENSION Description: Patient will identify 2 blood pressure medications she is taking and indication Outcome: Progressing Goal: RH STG INCREASE KNOWLEGDE OF HYPERLIPIDEMIA Description: Patient will identify which medication on for HLD and 1 complimentary intervention Outcome: Progressing Goal: RH STG INCREASE KNOWLEDGE OF STROKE PROPHYLAXIS Outcome: Progressing   Problem: RH Vision Goal: RH LTG Vision (Specify) Outcome: Progressing   Problem: RH Pre-functional/Other (Specify) Goal: RH LTG Pre-functional (Specify) Outcome: Progressing Goal: RH LTG Interdisciplinary (Specify) 1 Description: RH LTG Interdisciplinary (Specify)1 Outcome: Progressing Goal: RH LTG Interdisciplinary (Specify) 2 Description: RH LTG Interdisciplinary (Specify) 2  Outcome: Progressing   

## 2019-07-12 NOTE — Progress Notes (Signed)
[facility] PHYSICAL MEDICINE & REHABILITATION PROGRESS NOTE  Subjective/Complaints: No new issues. Had a good night. Very excited about going home today!  ROS: Patient denies fever, rash, sore throat, blurred vision, nausea, vomiting, diarrhea, cough, shortness of breath or chest pain, joint or back pain, headache, or mood change.    Objective: Vital Signs: Blood pressure 129/79, pulse 79, temperature 97.9 F (36.6 C), resp. rate 16, height 5' 3.5" (1.613 m), weight 67.3 kg, SpO2 95 %. No results found. Recent Labs    07/11/19 0601  WBC 10.2  HGB 11.2*  HCT 36.0  PLT 397   Recent Labs    07/11/19 0601  NA 138  K 4.5  CL 105  CO2 28  GLUCOSE 97  BUN 22  CREATININE 1.10*  CALCIUM 8.6*    Physical Exam: BP 129/79 (BP Location: Right Arm)   Pulse 79   Temp 97.9 F (36.6 C)   Resp 16   Ht 5' 3.5" (1.613 m)   Wt 67.3 kg   SpO2 95%   BMI 25.87 kg/m  Constitutional: No distress . Vital signs reviewed. HEENT: EOMI, oral membranes moist Neck: supple Cardiovascular: RRR without murmur. No JVD    Respiratory/Chest: CTA Bilaterally without wheezes or rales. Normal effort, sternal discomfort GI/Abdomen: BS +, non-tender, non-distended Ext: no clubbing, cyanosis, or edema Skin: Warm and dry.  Intact. Psych: pleasant Musc: No edema in extremities.  No tenderness in extremities.  Neurological: oriented x 3. Good insight and awareness. No CN findings--Motor 4+/5. Normal sensation   Assessment/Plan: 1. Functional deficits secondary to debility which require 3+ hours per day of interdisciplinary therapy in a comprehensive inpatient rehab setting.  Physiatrist is providing close team supervision and 24 hour management of active medical problems listed below.  Physiatrist and rehab team continue to assess barriers to discharge/monitor patient progress toward functional and medical goals  Care Tool:  Bathing    Body parts bathed by patient: Right arm, Left arm, Chest,  Abdomen, Front perineal area, Buttocks, Right upper leg, Left upper leg, Left lower leg, Right lower leg, Face         Bathing assist Assist Level: Contact Guard/Touching assist(per most recent staff report)     Upper Body Dressing/Undressing Upper body dressing   What is the patient wearing?: Pull over shirt    Upper body assist Assist Level: Set up assist(per most recent staff report)    Lower Body Dressing/Undressing Lower body dressing      What is the patient wearing?: Pants     Lower body assist Assist for lower body dressing: Independent with assitive device(per most recent staff report)     Toileting Toileting Toileting Activity did not occur (Clothing management and hygiene only): N/A (no void or bm)  Toileting assist Assist for toileting: Supervision/Verbal cueing(per staff report)     Transfers Chair/bed transfer  Transfers assist     Chair/bed transfer assist level: Independent with assistive device     Locomotion Ambulation   Ambulation assist      Assist level: Independent with assistive device Assistive device: Walker-rolling Max distance: 150   Walk 10 feet activity   Assist     Assist level: Independent with assistive device Assistive device: Walker-rolling   Walk 50 feet activity   Assist    Assist level: Independent with assistive device Assistive device: Walker-rolling    Walk 150 feet activity   Assist Walk 150 feet activity did not occur: Safety/medical concerns  Assist level: Independent with assistive  device Assistive device: Walker-rolling    Walk 10 feet on uneven surface  activity   Assist     Assist level: Independent with assistive device Assistive device: Other (comment)(railing as needed)   Wheelchair     Assist Will patient use wheelchair at discharge?: No   Wheelchair activity did not occur: N/A         Wheelchair 50 feet with 2 turns activity    Assist    Wheelchair 50 feet  with 2 turns activity did not occur: N/A       Wheelchair 150 feet activity     Assist Wheelchair 150 feet activity did not occur: N/A       Medical Problem List and Plan: 1.  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 fib  -dc home. Spoke to daughter re: follow up/plan  Patient to see Dr. Carlis Abbott in the office for transitional care encounter in 1-2 weeks.  2.  Antithrombotics: -DVT/anticoagulation: Pradaxa 150 mg every 12 hours             -antiplatelet therapy: Plavix 75 mg daily  -spoke to pt's daughter at length regarding the need for pradaxa AND plavix (pharmacist had raised a concern to her) 3. Pain Management: Lidoderm patch, Ultram as needed. Pain is currently well controlled.  4. Mood: Provide emotional support              -antipsychotic agents: N/A 5. Neuropsych: This patient is capable of making decisions on her own behalf. 6. Skin/Wound Care: Routine skin checks 7. Fluids/Electrolytes/Nutrition: encourage po   -protein supp for low albumin   Labs wnl yesterday 8.  Atrial fibrillation.  Continue Pradaxa.   Continue Toprol 100 mg daily             HR controlled.   9.  Diastolic congestive heart failure.  Lasix 20 mg daily as well as Cozaar 25 mg daily.  2/8--weights have been stable Filed Weights   07/08/19 0446 07/09/19 0503 07/11/19 0547  Weight: 67.6 kg 66.6 kg 67.3 kg  10.  Hypothyroidism.    Continue Synthroid 11.  Hyperlipidemia Lipitor 12.  CKD.  Creatinine baseline 1.05-1.18.               2/22 Cr 0.94  2/27 Cr 1.1 13. Blood tinged sputum:  -no change  -HGB up to 11.6 2/22  -likely d/t repeated coughing, pradaxa/plavix.  chest also sore from CPR  -added mucinex dm, MMW w/ lidocaine and chlorseptic spray  -lidocaine, ice/heat to sternum, provides some relief.   -reassured pt that this will gradually improve     Hgb  stable at 11.2   LOS: 8 days A FACE TO FACE EVALUATION WAS PERFORMED  Ranelle Oyster 07/12/2019, 9:11 AM

## 2019-07-13 ENCOUNTER — Telehealth: Payer: Self-pay

## 2019-07-13 NOTE — Telephone Encounter (Signed)
SW received updates from [facility]- Outpatient Therapy [phone]) about referral for PT/POT stating referral was received, and that they close early on Fridays (11am). Reports they will reach out to pt to schedule appointment.  SW called pt home [phone] and spoke with pt husband and informed on above. SW reminded pt husband of telephone number provided prior to d/c and to follow-up if there are any issues.   *No further intervention required by SW.

## 2019-07-13 NOTE — Telephone Encounter (Signed)
SW received phone call from [facility] to confirm pt d/c on 2/27. SW confirmed and informed on d/c recs for OPT PT/OT. Reports pt case will be closed. SW informed appropriate staff.

## 2019-07-13 NOTE — Progress Notes (Signed)
Social Work Discharge Note   The overall goal for the admission was met for:   Discharge location: Yes. Discharge to home.   Length of Stay: Yes. 8 days.   Discharge activity level: Yes. Min Asst.  Home/community participation: Yes. Limited  Services provided included: MD, RD, PT, OT, SLP, RN, CM, TR, Pharmacy, Neuropsych and SW  Financial Services: Private Insurance: [facility] Medicare  Follow-up services arranged: Outpatient: [facility] Outpatient PT/OT [phone] and DME: [place]- # [phone]; TTB with back  Comments (or additional information): Pt and pt husband [phone]  Patient/Family verbalized understanding of follow-up arrangements: Yes  Individual responsible for coordination of the follow-up plan: Pt and pt husband to coordinate pt care needs.    Confirmed correct DME delivered: Rana Snare 07/13/2019    Rana Snare

## 2019-07-14 ENCOUNTER — Telehealth: Payer: Self-pay

## 2019-07-14 NOTE — Telephone Encounter (Signed)
  TRANSITIONAL CARE CALL  Appointment Date and Time: 07-23-2019 / 1:20pm  With: Riley Lam then Dr. Lise Auer  Questions for our staff to ask patients on Transitional care 48 hour phone call:   1. Are you/is patient experiencing any problems since coming home? NO     Are there any questions regarding any aspect of care? NO  2. Are there any questions regarding medications administration/dosing? NO     Are meds being taken as prescribed? YES  3. Have there been any falls? NO  4. Has Home Health been to the house and/or have they contacted you? N/A- OUTPATIENT     If not, have you tried to contact them? NA     Can we help you contact them? NA  5. Are bowels and bladder emptying properly? NA     Are there any unexpected incontinence issues? NA     If applicable, is patient following bowel/bladder programs? NA  6. Any fevers, problems with breathing, unexpected pain? NO  7. Are there any skin problems or new areas of breakdown? NO  8. Has the patient/family member arranged specialty MD follow up (ie cardiology/neurology/renal/surgical/etc)?      Can we help arrange? YES  9. Does the patient need any other services or support that we can help arrange? NO  10. Are caregivers following through as expected in assisting the patient? YES  11. Has the patient quit smoking, drinking alcohol, or using drugs as recommended? NA  [facility] [address] [phone]

## 2019-07-20 ENCOUNTER — Other Ambulatory Visit: Payer: Self-pay

## 2019-07-20 ENCOUNTER — Ambulatory Visit: Payer: Medicare PPO | Admitting: Occupational Therapy

## 2019-07-20 ENCOUNTER — Encounter: Payer: Self-pay | Admitting: Occupational Therapy

## 2019-07-20 ENCOUNTER — Ambulatory Visit: Payer: Medicare PPO | Attending: Physician Assistant | Admitting: Physical Therapy

## 2019-07-20 DIAGNOSIS — R278 Other lack of coordination: Secondary | ICD-10-CM | POA: Insufficient documentation

## 2019-07-20 DIAGNOSIS — I639 Cerebral infarction, unspecified: Secondary | ICD-10-CM | POA: Diagnosis present

## 2019-07-20 DIAGNOSIS — M6281 Muscle weakness (generalized): Secondary | ICD-10-CM

## 2019-07-20 DIAGNOSIS — R262 Difficulty in walking, not elsewhere classified: Secondary | ICD-10-CM | POA: Insufficient documentation

## 2019-07-20 DIAGNOSIS — I63512 Cerebral infarction due to unspecified occlusion or stenosis of left middle cerebral artery: Secondary | ICD-10-CM

## 2019-07-20 DIAGNOSIS — R2689 Other abnormalities of gait and mobility: Secondary | ICD-10-CM | POA: Diagnosis present

## 2019-07-20 NOTE — Therapy (Signed)
[place] MAIN [facility] SERVICES [address] [place], [place], [address] Phone: [phone]   Fax:  [phone]  Physical Therapy Evaluation  Patient Details  Name: Courtney Chan MRN: [id number] Date of Birth: November 27, 1930 Referring Provider (PT): Roque Lias   Encounter Date: 07/20/2019  PT End of Session - 07/20/19 1022    Visit Number  1    Number of Visits  17    Date for PT Re-Evaluation  09/14/19    PT Start Time  1010    PT Stop Time  1100    PT Time Calculation (min)  50 min    Equipment Utilized During Treatment  Gait belt    Activity Tolerance  Patient tolerated treatment well       Past Medical History:  Diagnosis Date  . Atrial fibrillation (HCC) 2011  . Hyperlipidemia   . Hypertension   . Stroke (HCC)   . Thyroid disease     Past Surgical History:  Procedure Laterality Date  . ABDOMINAL HYSTERECTOMY    . BREAST BIOPSY Left 1980   Negative  . CORONARY/GRAFT ACUTE MI REVASCULARIZATION N/A 06/28/2019   Procedure: Coronary/Graft Acute MI Revascularization;  Surgeon: Marykay Lex, MD;  Location: [facility] INVASIVE CV LAB;  Service: Cardiovascular;  Laterality: N/A;  . LEFT HEART CATH AND CORONARY ANGIOGRAPHY N/A 06/28/2019   Procedure: LEFT HEART CATH AND CORONARY ANGIOGRAPHY;  Surgeon: Marykay Lex, MD;  Location: [facility] INVASIVE CV LAB;  Service: Cardiovascular;  Laterality: N/A;    There were no vitals filed for this visit.   Subjective Assessment - 07/20/19 1014    Subjective  Patient is walking with a RW. She is living with her husband and daughter.    Pertinent History  Courtney Chan is a 86 y.o. right-handed female with history of atrial fibrillation followed by Dr.[facility]as[facility], hyperlipidemia, hypertension. Patient lives with spouse independent prior to admission noted some recent cognitive decline.  Presented 06/28/2019 with chest pain at [facility].   She is found to have non-STEMI with PCI  completed per cardiology services.  On 06/29/2019 after extubation altered mental status not following commands.  MRI showed small acute infarction.  Per report small acute infarct involving the posterior aspect of the left superior frontal gyrus.  She went to inpatient rehab for a week and now is back at her homoe.    How long can you sit comfortably?  unlimited    How long can you stand comfortably?  10    How long can you walk comfortably?  10    Patient Stated Goals  to walk better.    Currently in Pain?  Yes    Pain Score  5     Pain Location  Chest    Pain Descriptors / Indicators  Aching    Pain Type  Chronic pain    Pain Onset  1 to 4 weeks ago    Pain Frequency  Several days a week    Aggravating Factors   moving around    Pain Relieving Factors  nothing    Effect of Pain on Daily Activities  no effect    Multiple Pain Sites  No         [facility] PT Assessment - 07/20/19 1018      Assessment   Medical Diagnosis  CVA    Referring Provider (PT)  Roque Lias    Onset Date/Surgical Date  06/28/19    Hand Dominance  Right    Prior Therapy  in patient       Precautions   Precautions  None      Restrictions   Weight Bearing Restrictions  No      Balance Screen   Has the patient fallen in the past 6 months  No    Has the patient had a decrease in activity level because of a fear of falling?   Yes    Is the patient reluctant to leave their home because of a fear of falling?   No      Home Nurse, mental health  Private residence    Living Arrangements  Spouse/significant other;Children    Available Help at Discharge  Family    Type of Home  House    Home Access  Stairs to enter    Entrance Stairs-Number of Steps  --   2   Entrance Stairs-Rails  Right    Home Layout  One level    Home Equipment  Walker - 2 wheels;Shower seat;Grab bars - tub/shower;Wheelchair - manual      Prior Function   Level of Independence  Independent    Vocation  Retired     Leisure  travel, yard      Cognition   Overall Cognitive Status  Within Functional Limits for tasks assessed         POSTURE: WNL   PROM/AROM: WFL BUE and BLE  STRENGTH:  Graded on a 0-5 scale Muscle Group Left Right                          Hip Flex 3/5 3/5  Hip Abd 3/5 3/5  Hip Add 1/5 1/5  Hip Ext 3/5 3/5  Hip IR/ER 3/5 3/5  Knee Flex 3+/5 3+/5  Knee Ext 3+/5 3+/5  Ankle DF 4/5 4/5  Ankle PF 4/5 4/5   SENSATION:WNL BUE and BLE   FUNCTIONAL MOBILITY difficulty with all rolling due to chest pain.  sidelying to sit with min assist  Transfers sit to stand with supervision   BALANCE: Static Standing Balance  Normal Able to maintain standing balance against maximal resistance   Good Able to maintain standing balance against moderate resistance   Good-/Fair+ Able to maintain standing balance against minimal resistance   Fair Able to stand unsupported without UE support and without LOB for 1-2 min x  Fair- Requires Min A and UE support to maintain standing without loss of balance   Poor+ Requires mod A and UE support to maintain standing without loss of balance   Poor Requires max A and UE support to maintain standing balance without loss    Standing Dynamic Balance  Normal Stand independently unsupported, able to weight shift and cross midline maximally   Good Stand independently unsupported, able to weight shift and cross midline moderately   Good-/Fair+ Stand independently unsupported, able to weight shift across midline minimally   Fair Stand independently unsupported, weight shift, and reach ipsilaterally, loss of balance when crossing midline x  Poor+ Able to stand with Min A and reach ipsilaterally, unable to weight shift   Poor Able to stand with Mod A and minimally reach ipsilaterally, unable to cross midline.       GAIT: Patient ambulates with RW for intermediate distances with supervision and decreased gait speed with step to gait  pattern   OUTCOME MEASURES: TEST Outcome Interpretation  5 times sit<>stand 22.36ec >60 yo, >15 sec  indicates increased risk for falls  10 meter walk test       .81          m/s <1.0 m/s indicates increased risk for falls; limited community ambulator  Timed up and Go   18 sec              sec <14 sec indicates increased risk for falls  6 minute walk test    700            Feet 1000 feet is community Financial controller NT due to time <36/56 (100% risk for falls), 37-45 (80% risk for falls); 46-51 (>50% risk for falls); 52-55 (lower risk <25% of falls)                Objective measurements completed on examination: See above findings.              PT Education - 07/20/19 1022    Education Details  plan of care    Person(s) Educated  Patient    Methods  Explanation    Comprehension  Verbalized understanding       PT Short Term Goals - 07/20/19 1542      PT SHORT TERM GOAL #1   Title  Patient will be independent in home exercise program to improve strength/mobility for better functional independence with ADLs.    Time  4    Period  Weeks    Status  New    Target Date  08/17/19      PT SHORT TERM GOAL #2   Title  Patient (> 105 years old) will complete five times sit to stand test in < 15 seconds indicating an increased LE strength and improved balance.    Time  4    Period  Weeks    Status  New    Target Date  08/17/19        PT Long Term Goals - 07/20/19 1546      PT LONG TERM GOAL #1   Title  Patient will increase Berg Balance score by > 6 points to demonstrate decreased fall risk during functional activities.    Time  8    Period  Weeks    Status  New    Target Date  09/14/19      PT LONG TERM GOAL #2   Title  Patient will increase six minute walk test distance to >1000 for progression to community ambulator and improve gait ability    Time  8    Period  Weeks    Status  New    Target Date  09/14/19      PT LONG TERM GOAL #3    Title  Patient will increase 10 meter walk test to >1.55m/s as to improve gait speed for better community ambulation and to reduce fall risk.    Time  8    Period  Weeks    Status  New    Target Date  09/14/19      PT LONG TERM GOAL #4   Title  Patient will reduce timed up and go to <11 seconds to reduce fall risk and demonstrate improved transfer/gait ability.    Time  8    Period  Weeks    Status  New    Target Date  09/14/19             Plan - 07/20/19 1529    Clinical Impression Statement  Pt is  a  86 y/o who presents with R sided weakness after having a CVA 06/28/19.  Patient demonstrates strength deficits in RUE and RLE with functional implications with bed mobility, transfers, and ambulation.  Patient demonstrates poor safety techniques with transfers and ambulation.She has 5/10 chest pain that came after CPR.   Pt will benefit from skilled PT interventions to address these deficits listed for improved quality of life.    Personal Factors and Comorbidities  Age    Examination-Participation Restrictions  Cleaning;Driving    Stability/Clinical Decision Making  Stable/Uncomplicated    Clinical Decision Making  Low    Rehab Potential  Good    PT Frequency  2x / week    PT Duration  8 weeks    PT Treatment/Interventions  Gait training;Therapeutic exercise;Balance training;Neuromuscular re-education;Therapeutic activities;Functional mobility training;Moist Heat;Patient/family education    PT Next Visit Plan  strengthening and balance training    PT Home Exercise Plan  RTB hip abd and ext in standing, heel raises    Consulted and Agree with Plan of Care  Patient;Family member/caregiver       Patient will benefit from skilled therapeutic intervention in order to improve the following deficits and impairments:  Abnormal gait, Difficulty walking, Decreased strength, Decreased balance, Pain, Decreased activity tolerance  Visit Diagnosis: Acute cerebral infarction [facility])  Left  middle cerebral artery stroke (HCC)  Difficulty in walking, not elsewhere classified  Other abnormalities of gait and mobility     Problem List Patient Active Problem List   Diagnosis Date Noted  . Hypothyroidism   . Chronic kidney disease   . Supplemental oxygen dependent   . 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 Debility 07/04/2019  . CHF (congestive heart failure) ([place])   . Atrial fibrillation ([place])   . Acute cerebral infarction ([place])   . Non-ST elevation (NSTEMI) myocardial infarction ([place])   . Non-ST elevation MI (NSTEMI) ([place]) 06/28/2019  . Cardiac arrest due to underlying cardiac condition ([place]) 06/28/2019  . Cardiac arrest ([place]) 06/28/2019  . Ventilator dependent ([place])   . PAD (peripheral artery disease) ([place]) 01/11/2019  . Acute cystitis without hematuria 12/31/2018  . Diarrhea 12/31/2018  . CKD (chronic kidney disease) stage 3, GFR 30-59 ml/min 05/27/2018  . History of TIA (transient ischemic attack) 05/27/2018  . Persistent atrial fibrillation ([place]) 08/25/2017  . Osteopenia of multiple sites 11/09/2016  . DNR (do not resuscitate) 10/31/2016  . Encounter for general adult medical examination without abnormal findings 05/02/2016  . Vaccine counseling 11/01/2015  . Borderline diabetes mellitus 11/03/2013  . Pure hypercholesterolemia 11/03/2013  . Atrial fibrillation with RVR ([place]) 09/14/2013  . Acquired hypothyroidism 09/14/2013  . Diverticulosis 09/14/2013  . Essential hypertension 09/14/2013  . GERD without esophagitis 09/14/2013  . Mitral valve prolapse 09/14/2013  . H/O cardiac catheterization 08/12/2008    Alanson Puls, [place] DPT 07/20/2019, 3:49 PM  [place] MAIN [facility] SERVICES [address] [place], [place], [address] Phone: [phone]   Fax:  [phone]  Name: Courtney Chan MRN: [id number] Date of Birth: 1931/01/05

## 2019-07-20 NOTE — Addendum Note (Signed)
Addended by: Ezekiel Ina on: 07/20/2019 03:57 PM   Modules accepted: Orders

## 2019-07-21 NOTE — Therapy (Signed)
[place] MAIN [facility] SERVICES [address] [place], [place], [address] Phone: [phone]   Fax:  [phone]  Occupational Therapy Evaluation  Patient Details  Name: Courtney Chan MRN: [id number] Date of Birth: April 17, 1931 No data recorded  Encounter Date: 07/20/2019  OT End of Session - 07/21/19 0922    Visit Number  1    Number of Visits  16    Date for OT Re-Evaluation  09/15/19    Authorization Type  [employer]    OT Start Time  1104    OT Stop Time  1200    OT Time Calculation (min)  56 min    Activity Tolerance  Patient tolerated treatment well    Behavior During Therapy  [facility] for tasks assessed/performed       Past Medical History:  Diagnosis Date  . Atrial fibrillation ([place]) 2011  . Hyperlipidemia   . Hypertension   . Stroke ([place])   . Thyroid disease     Past Surgical History:  Procedure Laterality Date  . ABDOMINAL HYSTERECTOMY    . BREAST BIOPSY Left 1980   Negative  . CORONARY/GRAFT ACUTE MI REVASCULARIZATION N/A 06/28/2019   Procedure: Coronary/Graft Acute MI Revascularization;  Surgeon: Leonie Man, MD;  Location: [place] CV LAB;  Service: Cardiovascular;  Laterality: N/A;  . LEFT HEART CATH AND CORONARY ANGIOGRAPHY N/A 06/28/2019   Procedure: LEFT HEART CATH AND CORONARY ANGIOGRAPHY;  Surgeon: Leonie Man, MD;  Location: [place] CV LAB;  Service: Cardiovascular;  Laterality: N/A;    There were no vitals filed for this visit.  Subjective Assessment - 07/20/19 1110    Subjective   Patient reports she and her husband walks 2 miles everyday and went for a walk and needed rest so her husband took her to the ER.  Patient reports she had a heart attack and then a stroke the next date.   occurred on Valentine's Day.    Patient is accompanied by:  Family member    Pertinent History  Patient was in [place] for 2 weeks with one of those weeks for rehab.No home health.  Came home a week ago last Sunday.    Patient Stated Goals  Patient wants to get stronger and back to normal, be more independent.    Pain Score  8     Pain Location  Chest    Pain Orientation  Mid    Pain Descriptors / Indicators  Sharp    Pain Onset  1 to 4 weeks ago    Pain Frequency  Constant    Multiple Pain Sites  No        [facility] OT Assessment - 07/20/19 1116      Assessment   Medical Diagnosis  CVA    Onset Date/Surgical Date  06/28/19    Hand Dominance  Right    Prior Therapy  in patient       Precautions   Precautions  None      Restrictions   Weight Bearing Restrictions  No      Home  Environment   Family/patient expects to be discharged to:  Private residence    Living Arrangements  Spouse/significant other    Available Help at Discharge  Family    Type of [place]  One level    Alternate Level Stairs - Number of Steps  2 steps to enter with  railing    Bathroom Shower/Tub  Tub/Shower unit;Door    [employer] characteristics  Door    [employer]  Handicapped height    Bathroom Accessibility  Yes    Home Equipment  Grab bars - tub/shower;Toilet riser;Walker - 2 wheels;Shower seat    Lives With  Spouse      Prior Function   Level of Independence  Independent    Vocation  Retired    Leisure  travel, yard      ADL   Eating/Feeding  Modified independent    Grooming  Moderate assistance    Upper Body Bathing  Minimal assistance    Lower Body Bathing  Minimal assistance    Upper Body Dressing  Moderate assistance    Lower Body Dressing  Moderate assistance    Toilet Transfer  Modified independent    Toileting - Clothing Manipulation  Modified independent    Toileting -  Hygiene  Modified Independent    Tub/Shower Transfer  Minimal assistance    ADL comments  Patient's daughter is staying with her at night and helping with tasks during the day as needed.  Patient has not been able to participate in higher level IADL tasks since being home in the last  couple weeks.       IADL   Prior Level of Function Shopping  independent    Shopping  Needs to be accompanied on any shopping trip    Prior Level of Function Light Housekeeping  independent    Light Housekeeping  Needs help with all home maintenance tasks    Prior Level of Function Meal Prep  independent    Meal Prep  Needs to have meals prepared and served    Prior Level of Function Curator  Relies on family or friends for transportation    Prior Level of Function Medication Managment  independent    Medication Management  Is not capable of dispensing or managing own medication;Has difficulty remembering to take medication    Prior Level of Function Financial Management  independent    Financial Management  Requires supervision/minimal cuing      Mobility   Mobility Status  Needs assist    Mobility Status Comments  reports one fall when it was snowing.      Written Expression   Dominant Hand  Right      Vision - History   Baseline Vision  Wears glasses all the time      Cognition   Overall Cognitive Status  Within Functional Limits for tasks assessed    Cognition Comments  Patient and daughter both report no changes in cognition since CVA.        Sensation   Light Touch  Appears Intact    Stereognosis  Appears Intact    Hot/Cold  Appears Intact    Proprioception  Impaired Detail    Proprioception Impaired Details  Impaired LUE    Additional Comments  Patient appears to have some mild impairments with proprioception of the left UE, difficulty with formulating the same movements on the left than right.       Coordination   Gross Motor Movements are Fluid and Coordinated  Yes    Fine Motor Movements are Fluid and Coordinated  Yes    Finger Nose Finger Test  slightly impaired    Right 9 Hole Peg Test  33    Left 9 Hole Peg Test  35    Other  full opposition of thumb to digits    Coordination  mild coordination impairments       Perception   Perception  Impaired    Spatial Orientation  impaired on left with movements      ROM / Strength   AROM / PROM / Strength  AROM;Strength      AROM   Overall AROM   Deficits    Overall AROM Comments  Right shoulder flexion to 135 degrees, left UE to 110, ABD left to 100, elbow flexion/ext WNLs, wrist WFLs.        Strength   Overall Strength  Deficits    Overall Strength Comments  RUE 4/5, LUE shoulder 3/5, elbow 3+/5, wrist 3+/5      Hand Function   Right Hand Grip (lbs)  25    Right Hand Lateral Pinch  11 lbs    Right Hand 3 Point Pinch  9 lbs    Left Hand Grip (lbs)  25    Left Hand Lateral Pinch  11 lbs    Left 3 point pinch  10 lbs       Patient able demonstrate full opposition of thumb to each digit on the left but produces slow movements.    Patient and daughter instructed on the need to allow patient to slowly increase her participation in self care and IADL tasks.  Daughter has been providing min to moderate level of assist for most self care tasks and performing most homemaking tasks.  Patient has not returned to driving.     Decreased functional balance affecting shower transfers, functional mobility around the house and the community.  Patient ambulating with RW.                   OT Long Term Goals - 07/21/19 1000      OT LONG TERM GOAL #1   Title  Patient will be independent with home exercise program for strengthening and coordination.    Baseline  no current program    Time  8    Period  Weeks    Status  New    Target Date  09/15/19      OT LONG TERM GOAL #2   Title  Patient will complete upper body and lower body bathing with modified independence.    Baseline  min assist at eval    Time  8    Period  Weeks    Status  New    Target Date  09/15/19      OT LONG TERM GOAL #3   Title  Patient to perform UB and LB dressing skills with modified independence.    Baseline  moderate assist at evaluation    Time  8    Period   Weeks    Status  New    Target Date  09/15/19      OT LONG TERM GOAL #4   Title  Patient will complete bed making with modified independence.    Baseline  mod assist at eval    Time  4    Period  Weeks    Status  New    Target Date  08/11/19      OT LONG TERM GOAL #5   Title  Patient will complete light meal preparation with modified independence    Baseline  unable at evaluation    Time  8    Period  Weeks    Status  New    Target Date  09/15/19      Long Term Additional Goals   Additional Long Term Goals  Yes      OT LONG TERM GOAL #6   Title  Patient will increase strength in LUE by 1 mm grade to perform homemaking tasks with modified independence.    Baseline  decreased strength in LUE at eval    Time  8    Period  Weeks    Status  New    Target Date  09/15/19            Plan - 07/21/19 8127    Clinical Impression Statement  Patient is a 86 yo female diagnosed with CVA and seen for OP OT evaluation.  Patient presents with muscle weakness, decreased coordination, decreased tolerance to activity, decreased functional balance, decreased ability to perform basic self care and IADL tasks.  Patient would benefit from skilled OT to [facility] safety and independence in necessary daily tasks and decrease caregiver burden.    OT Occupational Profile and History  Detailed Assessment- Review of Records and additional review of physical, cognitive, psychosocial history related to current functional performance    Occupational performance deficits (Please refer to evaluation for details):  ADL's;IADL's;Leisure    Body Structure / Function / Physical Skills  ADL;Coordination;Endurance;GMC;UE functional use;Balance;Decreased knowledge of use of DME;Flexibility;IADL;Pain;Dexterity;FMC;Strength;Proprioception;Mobility;ROM    Psychosocial Skills  Environmental  Adaptations;Routines and Behaviors;Habits    Rehab Potential  Good    Clinical Decision Making  Limited treatment options, no  task modification necessary    Comorbidities Affecting Occupational Performance:  May have comorbidities impacting occupational performance    Modification or Assistance to Complete Evaluation   No modification of tasks or assist necessary to complete eval    OT Frequency  2x / week    OT Duration  8 weeks    OT Treatment/Interventions  Self-care/ADL training;Cryotherapy;Therapeutic exercise;DME and/or AE instruction;Functional Mobility Training;Balance training;Neuromuscular education;Manual Therapy;Moist Heat;Contrast Bath;Energy conservation;Passive range of motion;Therapeutic activities;Patient/family education    Consulted and Agree with Plan of Care  Patient;Family member/caregiver    Family Member Consulted  daughter       Patient will benefit from skilled therapeutic intervention in order to improve the following deficits and impairments:   Body Structure / Function / Physical Skills: ADL, Coordination, Endurance, GMC, UE functional use, Balance, Decreased knowledge of use of DME, Flexibility, IADL, Pain, Dexterity, FMC, Strength, Proprioception, Mobility, ROM   Psychosocial Skills: Environmental  Adaptations, Routines and Behaviors, Habits   Visit Diagnosis: Muscle weakness (generalized)  Other lack of coordination  Left middle cerebral artery stroke [facility])    Problem List Patient Active Problem List   Diagnosis Date Noted  . Hypothyroidism   . Chronic kidney disease   . Supplemental oxygen dependent   . 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 Debility 07/04/2019  . CHF (congestive heart failure) (HCC)   . Atrial fibrillation (HCC)   . Acute cerebral infarction (HCC)   . Non-ST elevation (NSTEMI) myocardial infarction (HCC)   . Non-ST elevation MI (NSTEMI) (HCC) 06/28/2019  . Cardiac arrest due to underlying cardiac condition (HCC) 06/28/2019  . Cardiac arrest (HCC) 06/28/2019  . Ventilator dependent (HCC)   . PAD (peripheral artery disease) (HCC)  01/11/2019  . Acute cystitis without hematuria 12/31/2018  . Diarrhea 12/31/2018  . CKD (chronic kidney disease) stage 3, GFR 30-59 ml/min 05/27/2018  . History of TIA (transient ischemic attack) 05/27/2018  . Persistent atrial fibrillation (HCC) 08/25/2017  . Osteopenia of multiple sites  11/09/2016  . DNR (do not resuscitate) 10/31/2016  . Encounter for general adult medical examination without abnormal findings 05/02/2016  . Vaccine counseling 11/01/2015  . Borderline diabetes mellitus 11/03/2013  . Pure hypercholesterolemia 11/03/2013  . Atrial fibrillation with RVR (HCC) 09/14/2013  . Acquired hypothyroidism 09/14/2013  . Diverticulosis 09/14/2013  . Essential hypertension 09/14/2013  . GERD without esophagitis 09/14/2013  . Mitral valve prolapse 09/14/2013  . H/O cardiac catheterization 08/12/2008   Kerrie Buffalomy T Shila Kruczek, OTR/L, CLT  Lillieanna Tuohy 07/21/2019, 10:11 AM  [facility] [address] , [place], [address] Phone: [phone]   Fax:  [phone]  Name: Donnal MoatCamille J Enerson MRN: [id number] Date of Birth: 03/08/1931

## 2019-07-22 ENCOUNTER — Encounter: Payer: Self-pay | Admitting: Physical Therapy

## 2019-07-22 ENCOUNTER — Other Ambulatory Visit: Payer: Self-pay

## 2019-07-22 ENCOUNTER — Ambulatory Visit: Payer: Medicare PPO | Admitting: Physical Therapy

## 2019-07-22 ENCOUNTER — Encounter: Payer: Medicare PPO | Admitting: Occupational Therapy

## 2019-07-22 DIAGNOSIS — I63512 Cerebral infarction due to unspecified occlusion or stenosis of left middle cerebral artery: Secondary | ICD-10-CM

## 2019-07-22 DIAGNOSIS — I639 Cerebral infarction, unspecified: Secondary | ICD-10-CM

## 2019-07-22 DIAGNOSIS — M6281 Muscle weakness (generalized): Secondary | ICD-10-CM

## 2019-07-22 DIAGNOSIS — R278 Other lack of coordination: Secondary | ICD-10-CM

## 2019-07-22 DIAGNOSIS — R2689 Other abnormalities of gait and mobility: Secondary | ICD-10-CM

## 2019-07-22 DIAGNOSIS — R262 Difficulty in walking, not elsewhere classified: Secondary | ICD-10-CM

## 2019-07-22 NOTE — Therapy (Signed)
[facility] MAIN [facility] SERVICES [address] [place], [place], [address] Phone: [phone]   Fax:  [phone]  Physical Therapy Treatment  Patient Details  Name: Courtney Chan MRN: [id number] Date of Birth: 05-21-1930 Referring Provider (PT): Roque Lias   Encounter Date: 07/22/2019  PT End of Session - 07/22/19 1014    Visit Number  2    Number of Visits  17    Date for PT Re-Evaluation  09/14/19    PT Start Time  1020    PT Stop Time  1100    PT Time Calculation (min)  40 min    Equipment Utilized During Treatment  Gait belt    Activity Tolerance  Patient tolerated treatment well       Past Medical History:  Diagnosis Date  . Atrial fibrillation (HCC) 2011  . Hyperlipidemia   . Hypertension   . Stroke (HCC)   . Thyroid disease     Past Surgical History:  Procedure Laterality Date  . ABDOMINAL HYSTERECTOMY    . BREAST BIOPSY Left 1980   Negative  . CORONARY/GRAFT ACUTE MI REVASCULARIZATION N/A 06/28/2019   Procedure: Coronary/Graft Acute MI Revascularization;  Surgeon: Marykay Lex, MD;  Location: [facility] INVASIVE CV LAB;  Service: Cardiovascular;  Laterality: N/A;  . LEFT HEART CATH AND CORONARY ANGIOGRAPHY N/A 06/28/2019   Procedure: LEFT HEART CATH AND CORONARY ANGIOGRAPHY;  Surgeon: Marykay Lex, MD;  Location: [facility] INVASIVE CV LAB;  Service: Cardiovascular;  Laterality: N/A;    There were no vitals filed for this visit.  Subjective Assessment - 07/22/19 1024    Subjective  Patient says that her chest is feeling better than it was now that she is getting out of bed differently.    Pertinent History  Courtney Chan is a 86 y.o. right-handed female with history of atrial fibrillation followed by Dr.Paraschos at [place] [facility], hyperlipidemia, hypertension. Patient lives with spouse independent prior to admission noted some recent cognitive decline.  Presented 06/28/2019 with chest [facility] at [facility]   She is found to  have non-STEMI with PCI completed per cardiology services.  On 06/29/2019 after extubation altered mental status not following commands.  MRI showed small acute infarction.  Per report small acute infarct involving the posterior aspect of the left superior frontal gyrus.  She went to inpatient rehab for a week and now is back at her homoe.    How long can you sit comfortably?  unlimited    How long can you stand comfortably?  10    How long can you walk comfortably?  10    Patient Stated Goals  to walk better.    Pain Onset  1 to 4 weeks ago       Therapeutic exercise: Nu-step x 5 mins L 2 Hip flexion marches x 10 bilateral; Hip abduction with RTB  x 10 bilateral; HS curls  x 10 bilateral; Hip extension , with RTB  x 10 bilaterally Lunges to BOSU ball x 15 BLE Heel raises x 15 x 2 sets High marching x 20 BLE Leg press x 25 lbs x 20 x 3  Step ups to 6-inch stool x 20  Lateral step ups to 6 inch stool Neuromuscular Re-education  Tandem stand, trunk rotation on floor without UE support x 1 min Side stepping on floor without UE support x 2 lengths   Pt educated throughout session about proper posture and technique with exercises. Improved exercise technique, movement at target  joints, use of target muscles after min to mod verbal, visual, tactile cues. CGA and Min to mod verbal cues used throughout with increased in postural sway and LOB most seen with narrow base of support and while on uneven surfaces. Continues to have balance deficits typical with diagnosis. Patient performs intermediate level exercises without pain behaviors and needs verbal cuing for postural alignment and head positioning Tactile cues and assistance needed to keep lower leg and knee in neutral to avoid compensations with ankle motions.  Patient needs occasional verbal cueing to improve posture and cueing to correctly perform exercises slowly, holding at end of range to increase motor firing of desired muscle to  encourage fatigue.  Pt educated throughout session about proper posture and technique with exercises. Improved exercise technique, movement at target joints, use of target muscles after min to mod verbal, visual, tactile cues.                          PT Education - 07/22/19 1014    Education Details  HEP    Person(s) Educated  Patient    Methods  Explanation    Comprehension  Verbalized understanding;Returned demonstration;Verbal cues required;Tactile cues required;Need further instruction       PT Short Term Goals - 07/20/19 1542      PT SHORT TERM GOAL #1   Title  Patient will be independent in home exercise program to improve strength/mobility for better functional independence with ADLs.    Time  4    Period  Weeks    Status  New    Target Date  08/17/19      PT SHORT TERM GOAL #2   Title  Patient (> 84 years old) will complete five times sit to stand test in < 15 seconds indicating an increased LE strength and improved balance.    Time  4    Period  Weeks    Status  New    Target Date  08/17/19        PT Long Term Goals - 07/20/19 1546      PT LONG TERM GOAL #1   Title  Patient will increase Berg Balance score by > 6 points to demonstrate decreased fall risk during functional activities.    Time  8    Period  Weeks    Status  New    Target Date  09/14/19      PT LONG TERM GOAL #2   Title  Patient will increase six minute walk test distance to >1000 for progression to community ambulator and improve gait ability    Time  8    Period  Weeks    Status  New    Target Date  09/14/19      PT LONG TERM GOAL #3   Title  Patient will increase 10 meter walk test to >1.39m/s as to improve gait speed for better community ambulation and to reduce fall risk.    Time  8    Period  Weeks    Status  New    Target Date  09/14/19      PT LONG TERM GOAL #4   Title  Patient will reduce timed up and go to <11 seconds to reduce fall risk and demonstrate  improved transfer/gait ability.    Time  8    Period  Weeks    Status  New    Target Date  09/14/19  Plan - 07/22/19 1015    Clinical Impression Statement  Patient instructed in beginning balance and coordination exercise. Patient required mod VCs and min A for gait to improve weight shift and postural control. Patient requires min VCs to improve ankle stability with gait.  Patients would benefit from additional skilled PT intervention to improve balance/gait safety and reduce fall risk.   Personal Factors and Comorbidities  Age    Examination-Participation Restrictions  Cleaning;Driving    Stability/Clinical Decision Making  Stable/Uncomplicated    Rehab Potential  Good    PT Frequency  2x / week    PT Duration  8 weeks    PT Treatment/Interventions  Gait training;Therapeutic exercise;Balance training;Neuromuscular re-education;Therapeutic activities;Functional mobility training;Moist Heat;Patient/family education    PT Next Visit Plan  strengthening and balance training    PT Home Exercise Plan  RTB hip abd and ext in standing, heel raises    Consulted and Agree with Plan of Care  Patient;Family member/caregiver       Patient will benefit from skilled therapeutic intervention in order to improve the following deficits and impairments:  Abnormal gait, Difficulty walking, Decreased strength, Decreased balance, Pain, Decreased activity tolerance  Visit Diagnosis: Muscle weakness (generalized)  Other lack of coordination  Left middle cerebral artery stroke (HCC)  Acute cerebral infarction ([place])  Difficulty in walking, not elsewhere classified  Other abnormalities of gait and mobility     Problem List Patient Active Problem List   Diagnosis Date Noted  . Hypothyroidism   . Chronic kidney disease   . Supplemental oxygen dependent   . Left middle cerebral artery stroke ([place]) 07/04/2019  . Debility 07/04/2019  . CHF (congestive heart failure) ([place])   .  Atrial fibrillation ([place])   . Acute cerebral infarction ([place])   . Non-ST elevation (NSTEMI) myocardial infarction ([place])   . Non-ST elevation MI (NSTEMI) ([place]) 06/28/2019  . Cardiac arrest due to underlying cardiac condition ([place]) 06/28/2019  . Cardiac arrest ([place]) 06/28/2019  . Ventilator dependent ([place])   . PAD (peripheral artery disease) ([place]) 01/11/2019  . Acute cystitis without hematuria 12/31/2018  . Diarrhea 12/31/2018  . CKD (chronic kidney disease) stage 3, GFR 30-59 ml/min 05/27/2018  . History of TIA (transient ischemic attack) 05/27/2018  . Persistent atrial fibrillation ([place]) 08/25/2017  . Osteopenia of multiple sites 11/09/2016  . DNR (do not resuscitate) 10/31/2016  . Encounter for general adult medical examination without abnormal findings 05/02/2016  . Vaccine counseling 11/01/2015  . Borderline diabetes mellitus 11/03/2013  . Pure hypercholesterolemia 11/03/2013  . Atrial fibrillation with RVR ([place]) 09/14/2013  . Acquired hypothyroidism 09/14/2013  . Diverticulosis 09/14/2013  . Essential hypertension 09/14/2013  . GERD without esophagitis 09/14/2013  . Mitral valve prolapse 09/14/2013  . H/O cardiac catheterization 08/12/2008    Alanson Puls, [place] DPT 07/22/2019, 10:25 AM  [place] MAIN [facility] SERVICES [address] [place], [place], [address] Phone: [phone]   Fax:  [phone]  Name: SHANYIAH CONDE MRN: [id number] Date of Birth: 04/29/1931

## 2019-07-23 ENCOUNTER — Other Ambulatory Visit: Payer: Self-pay

## 2019-07-23 ENCOUNTER — Encounter: Payer: Self-pay | Admitting: Registered Nurse

## 2019-07-23 ENCOUNTER — Encounter: Payer: Medicare PPO | Attending: Registered Nurse | Admitting: Registered Nurse

## 2019-07-23 VITALS — BP 110/73 | HR 98 | Temp 97.3°F | Ht 63.0 in | Wt 144.0 lb

## 2019-07-23 DIAGNOSIS — I10 Essential (primary) hypertension: Secondary | ICD-10-CM | POA: Insufficient documentation

## 2019-07-23 DIAGNOSIS — R5381 Other malaise: Secondary | ICD-10-CM | POA: Diagnosis present

## 2019-07-23 DIAGNOSIS — I4891 Unspecified atrial fibrillation: Secondary | ICD-10-CM | POA: Diagnosis present

## 2019-07-23 DIAGNOSIS — I63512 Cerebral infarction due to unspecified occlusion or stenosis of left middle cerebral artery: Secondary | ICD-10-CM | POA: Insufficient documentation

## 2019-07-23 DIAGNOSIS — I5030 Unspecified diastolic (congestive) heart failure: Secondary | ICD-10-CM | POA: Diagnosis present

## 2019-07-23 NOTE — Progress Notes (Signed)
Subjective:    Patient ID: Courtney Chan, female    DOB: [birth year], 86 y.o.   MRN: [id number]  HPI: Courtney Chan is a 86 y.o. female who is here for transitional care visit for follow up of her debility, left middle cerebral artery stroke, atrial fibrillation, hypertension and diastolic congestive heart failure.   Courtney Chan presented to [facility] on 06/28/2019 with chest pain, she had a cardiac arrest in the ED, found to have V. Fib, status post ACLS with defibrillation and ROSC . She was found to have NSTEMI, cardiology consulted. t She had a Left Heart Cath and coronary angiography on 06/28/2019  Previously placed Prox Cx to Mid Cx stent (unknown type) is widely patent -the lesion began just distal to the stent  CULPRIT LESION: Mid Cx lesion is 95% stenosed.  A drug-eluting stent was successfully placed in order to overlap the previously placed stent, using a STENT RESOLUTE ONYX 2.0X15. Postdilated to 2.3 mm - 2.1 mm  Post intervention, there is a 0% residual stenosis.  -----------------------------------  Ost RCA to Prox RCA lesion is 30% stenosed. Previously placed Prox RCA to Mid RCA stent (unknown type) is widely patent.  Mid LAD lesion is 30% stenosed.  -----------------------------------  There is moderate to severe left ventricular systolic dysfunction. The Left Ventricular Ejection Fraction is 25-35% by visual estimate.  LV end diastolic pressure is moderately elevated. There is mild (2+) mitral regurgitation. She was transferred [facility] CVICU. CT Head WO Contrast:  IMPRESSION: 1. Stable head CT, no acute intracranial process. On 06/28/2019. CT Angio Neck W or WO Contrast: On 06/29/2019 IMPRESSION: 1. No emergent vascular finding 2. Atherosclerosis without flow limiting stenosis or ulceration in the head and neck. 3. Non opacified left atrial appendage in the setting of atrial fibrillation that could be delayed filling versus thrombus. 4. Right  more than left pneumonia and atelectasis.  MRI Brain WO Contrast ordered for altered mental status:  IMPRESSION: 1. Small acute infarct involving the posterior aspect of the left superior frontal gyrus. 2. Encephalomalacia and gliosis involving the posterior right insula extending into the right temporal lobe. 3. Chronic small vessel ischemia and parenchymal volume loss. Neurology was consulted, she was maintained on Plavix as well as Pradaxa.   She was admitted to Inpatient Rehabilitation on 07/04/19 and discharged home on 07/12/2019. She is receiving therapy at [facility] outpatient Therapy. She denies chest pain, reports chest discomfort due to  post CPR events. On the flow sheet she rated her pain 5. Also states she has a good appetite.  Daughter in room all questions answered.     Pain Inventory Average Pain 5 Pain Right Now 5 My pain is intermittent and dull  In the last 24 hours, has pain interfered with the following? General activity 4 Relation with others 0 Enjoyment of life 5 What TIME of day is your pain at its worst? morning Sleep (in general) Good  Pain is worse with: inactivity Pain improves with: heat/ice Relief from Meds: 2  Mobility walk without assistance walk with assistance ability to climb steps?  yes do you drive?  no  Function retired I need assistance with the following:  dressing, bathing, meal prep, household duties and shopping  Neuro/Psych bowel control problems  Prior Studies Any changes since last visit?  no  Physicians involved in your care Any changes since last visit?  no   Family History  Problem Relation Age of Onset  . Breast cancer Maternal Grandmother  53's   Social History   Socioeconomic History  . Marital status: Married    Spouse name: Not on file  . Number of children: Not on file  . Years of education: Not on file  . Highest education level: Not on file  Occupational History  . Not on file  Tobacco Use    . Smoking status: Never Smoker  . Smokeless tobacco: Never Used  Substance and Sexual Activity  . Alcohol use: No  . Drug use: No  . Sexual activity: Not on file  Other Topics Concern  . Not on file  Social History Narrative  . Not on file   Social Determinants of Health   Financial Resource Strain:   . Difficulty of Paying Living Expenses:   Food Insecurity:   . Worried About Programme researcher, broadcasting/film/video in the Last Year:   . Barista in the Last Year:   Transportation Needs:   . Freight forwarder (Medical):   Marland Kitchen Lack of Transportation (Non-Medical):   Physical Activity:   . Days of Exercise per Week:   . Minutes of Exercise per Session:   Stress:   . Feeling of Stress :   Social Connections:   . Frequency of Communication with Friends and Family:   . Frequency of Social Gatherings with Friends and Family:   . Attends Religious Services:   . Active Member of Clubs or Organizations:   . Attends Banker Meetings:   Marland Kitchen Marital Status:    Past Surgical History:  Procedure Laterality Date  . ABDOMINAL HYSTERECTOMY    . BREAST BIOPSY Left 1980   Negative  . CORONARY/GRAFT ACUTE MI REVASCULARIZATION N/A 06/28/2019   Procedure: Coronary/Graft Acute MI Revascularization;  Surgeon: Marykay Lex, MD;  Location: [facility] INVASIVE CV LAB;  Service: Cardiovascular;  Laterality: N/A;  . LEFT HEART CATH AND CORONARY ANGIOGRAPHY N/A 06/28/2019   Procedure: LEFT HEART CATH AND CORONARY ANGIOGRAPHY;  Surgeon: Marykay Lex, MD;  Location: [facility] INVASIVE CV LAB;  Service: Cardiovascular;  Laterality: N/A;   Past Medical History:  Diagnosis Date  . Atrial fibrillation (HCC) 2011  . Hyperlipidemia   . Hypertension   . Stroke (HCC)   . Thyroid disease    BP 110/73   Pulse 98   Temp (!) 97.3 F (36.3 C)   Ht 5\' 3"  (1.6 m)   Wt 144 lb (65.3 kg)   SpO2 95%   BMI 25.51 kg/m   Opioid Risk Score:   Fall Risk Score:  `1  Depression screen PHQ 2/9  No flowsheet  data found.   Review of Systems  Constitutional: Negative.   HENT: Negative.   Eyes: Negative.   Respiratory: Negative.   Cardiovascular: Negative.   Gastrointestinal: Positive for diarrhea.  Endocrine: Negative.   Genitourinary: Negative.   Musculoskeletal: Negative.   Skin: Negative.   Allergic/Immunologic: Negative.   Neurological: Negative.   Hematological: Negative.   Psychiatric/Behavioral: Negative.   All other systems reviewed and are negative.      Objective:   Physical Exam Vitals and nursing note reviewed.  Constitutional:      Appearance: Normal appearance.  Cardiovascular:     Rate and Rhythm: Normal rate and regular rhythm.     Pulses: Normal pulses.     Heart sounds: Normal heart sounds.  Pulmonary:     Effort: Pulmonary effort is normal.     Breath sounds: Normal breath sounds.  Musculoskeletal:     Cervical  back: Normal range of motion and neck supple.     Comments: Normal Muscle Bulk and Muscle Testing Reveals:  Upper Extremities: Full ROM and Muscle Strength 5/5  Lower Extremities: Full ROM and Muscle Strength 5/5 Arises from Table with ease, ising walker for support  Narrow Based Gait   Skin:    General: Skin is warm and dry.  Neurological:     Mental Status: She is alert and oriented to person, place, and time.  Psychiatric:        Mood and Affect: Mood normal.        Behavior: Behavior normal.           Assessment & Plan:  1.Debility: Continue Outpatient Therapy: Continue to Monitor.  2. Left middle cerebral artery stroke: She has a HFU appointment with Dr. Pearlean Brownie on 08/20/2019. Continue current medication regimen.  3.Atrial fibrillation: Continue current medication regimen. Cardiology Following.  4.Essential Hypertension: Continue current medication regimen. PCP and Cardiology Following.  5.Diastolic congestive heart failure: Continue current medication regimen. Cardiology Following. Continue to Monitor.   15 minutes of face to face  patient care time was spent during this visit. All questions were encouraged and answered.  F/U in 4-6 weeks with Dr Carlis Abbott

## 2019-07-27 ENCOUNTER — Encounter: Payer: Self-pay | Admitting: Physical Therapy

## 2019-07-27 ENCOUNTER — Ambulatory Visit: Payer: Medicare PPO | Admitting: Physical Therapy

## 2019-07-27 ENCOUNTER — Ambulatory Visit: Payer: Medicare PPO | Admitting: Occupational Therapy

## 2019-07-27 ENCOUNTER — Other Ambulatory Visit: Payer: Self-pay

## 2019-07-27 DIAGNOSIS — M6281 Muscle weakness (generalized): Secondary | ICD-10-CM

## 2019-07-27 DIAGNOSIS — I63512 Cerebral infarction due to unspecified occlusion or stenosis of left middle cerebral artery: Secondary | ICD-10-CM

## 2019-07-27 DIAGNOSIS — I639 Cerebral infarction, unspecified: Secondary | ICD-10-CM

## 2019-07-27 DIAGNOSIS — R278 Other lack of coordination: Secondary | ICD-10-CM

## 2019-07-27 DIAGNOSIS — R2689 Other abnormalities of gait and mobility: Secondary | ICD-10-CM

## 2019-07-27 DIAGNOSIS — R262 Difficulty in walking, not elsewhere classified: Secondary | ICD-10-CM

## 2019-07-27 NOTE — Therapy (Signed)
[facility] MAIN [facility] SERVICES [address] [place], [place], [address] Phone: [phone]   Fax:  [phone]  Physical Therapy Treatment  Patient Details  Name: Courtney Chan MRN: [id number] Date of Birth: [birth year] Referring Provider (PT): Roque Lias   Encounter Date: 07/27/2019  PT End of Session - 07/27/19 1032    Visit Number  3    Number of Visits  17    Date for PT Re-Evaluation  09/14/19    PT Start Time  1020    PT Stop Time  1100    PT Time Calculation (min)  40 min    Equipment Utilized During Treatment  Gait belt    Activity Tolerance  Patient tolerated treatment well       Past Medical History:  Diagnosis Date  . Atrial fibrillation (HCC) 2011  . Hyperlipidemia   . Hypertension   . Stroke (HCC)   . Thyroid disease     Past Surgical History:  Procedure Laterality Date  . ABDOMINAL HYSTERECTOMY    . BREAST BIOPSY Left 1980   Negative  . CORONARY/GRAFT ACUTE MI REVASCULARIZATION N/A 06/28/2019   Procedure: Coronary/Graft Acute MI Revascularization;  Surgeon: Marykay Lex, MD;  Location: [facility] INVASIVE CV LAB;  Service: Cardiovascular;  Laterality: N/A;  . LEFT HEART CATH AND CORONARY ANGIOGRAPHY N/A 06/28/2019   Procedure: LEFT HEART CATH AND CORONARY ANGIOGRAPHY;  Surgeon: Marykay Lex, MD;  Location: [facility] INVASIVE CV LAB;  Service: Cardiovascular;  Laterality: N/A;    There were no vitals filed for this visit.  Subjective Assessment - 07/27/19 1031    Subjective  Patient is having less chest pain each day.    Pertinent History  Courtney Chan is a 86 y.o. right-handed female with history of atrial fibrillation followed by Dr.Paraschos at [place] [facility], hyperlipidemia, hypertension. Patient lives with spouse independent prior to admission noted some recent cognitive decline.  Presented 06/28/2019 with chest pain at [facility].   She is found to have non-STEMI with PCI completed per cardiology services.   On 06/29/2019 after extubation altered mental status not following commands.  MRI showed small acute infarction.  Per report small acute infarct involving the posterior aspect of the left superior frontal gyrus.  She went to inpatient rehab for a week and now is back at her homoe.    How long can you sit comfortably?  unlimited    How long can you stand comfortably?  10    How long can you walk comfortably?  10    Patient Stated Goals  to walk better.    Currently in Pain?  No/denies    Pain Score  0-No pain    Pain Onset  1 to 4 weeks ago        Treatment: Nu-step x 5 mins L 4  Leg press 25lbs x 20 x 3    Neuromuscular Re-education   Modified tandem stand without UE support x 2 lengths Side stepping on foam without UE support x 2 lengths Heel/toe raises without UE support 3s hold x 10 each Lateral side steps from foam to 6 inch stool left and right x 15 Backwards stepping from foam to 6 inch stool x 15  Hurdle fwd/bwd x 20       Pt educated throughout session about proper posture and technique with exercises. Improved exercise technique, movement at target joints, use of target muscles after min to mod verbal, visual, tactile cues. CGA and Min to  mod verbal cues used throughout with increased in postural sway and LOB most seen with narrow base of support and while on uneven surfaces. Continues to have balance deficits typical with diagnosis.                      PT Education - 07/27/19 1032    Education Details  HEP    Person(s) Educated  Patient    Methods  Explanation    Comprehension  Verbalized understanding       PT Short Term Goals - 07/20/19 1542      PT SHORT TERM GOAL #1   Title  Patient will be independent in home exercise program to improve strength/mobility for better functional independence with ADLs.    Time  4    Period  Weeks    Status  New    Target Date  08/17/19      PT SHORT TERM GOAL #2   Title  Patient (> 68 years old) will  complete five times sit to stand test in < 15 seconds indicating an increased LE strength and improved balance.    Time  4    Period  Weeks    Status  New    Target Date  08/17/19        PT Long Term Goals - 07/20/19 1546      PT LONG TERM GOAL #1   Title  Patient will increase Berg Balance score by > 6 points to demonstrate decreased fall risk during functional activities.    Time  8    Period  Weeks    Status  New    Target Date  09/14/19      PT LONG TERM GOAL #2   Title  Patient will increase six minute walk test distance to >1000 for progression to community ambulator and improve gait ability    Time  8    Period  Weeks    Status  New    Target Date  09/14/19      PT LONG TERM GOAL #3   Title  Patient will increase 10 meter walk test to >1.107m/s as to improve gait speed for better community ambulation and to reduce fall risk.    Time  8    Period  Weeks    Status  New    Target Date  09/14/19      PT LONG TERM GOAL #4   Title  Patient will reduce timed up and go to <11 seconds to reduce fall risk and demonstrate improved transfer/gait ability.    Time  8    Period  Weeks    Status  New    Target Date  09/14/19            Plan - 07/27/19 1033    Clinical Impression Statement   Pt presents with unsteadiness on uneven surfaces and fatigues with therapeutic exercises. Patient needs assist with instruction for balance with side stepping on uneven surfaces and needs CGA assist with balance activities. Patient demonstrates difficulty with side stepping and navigating small spaces with decreased base of support and increased challenges for LE.  Patient tolerated all interventions well this date and will benefit from continued skilled PT interventions to improve strength and balance and decrease risk of falling   Personal Factors and Comorbidities  Age    Examination-Participation Restrictions  Cleaning;Driving    Stability/Clinical Decision Making  Stable/Uncomplicated     Rehab Potential  Good  PT Frequency  2x / week    PT Duration  8 weeks    PT Treatment/Interventions  Gait training;Therapeutic exercise;Balance training;Neuromuscular re-education;Therapeutic activities;Functional mobility training;Moist Heat;Patient/family education    PT Next Visit Plan  strengthening and balance training    PT Home Exercise Plan  RTB hip abd and ext in standing, heel raises    Consulted and Agree with Plan of Care  Patient;Family member/caregiver       Patient will benefit from skilled therapeutic intervention in order to improve the following deficits and impairments:  Abnormal gait, Difficulty walking, Decreased strength, Decreased balance, Pain, Decreased activity tolerance  Visit Diagnosis: Muscle weakness (generalized)  Other lack of coordination  Left middle cerebral artery stroke (HCC)  Acute cerebral infarction (HCC)  Difficulty in walking, not elsewhere classified  Other abnormalities of gait and mobility     Problem List Patient Active Problem List   Diagnosis Date Noted  . Hypothyroidism   . Chronic kidney disease   . Supplemental oxygen dependent   . 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 Debility 07/04/2019  . CHF (congestive heart failure) (HCC)   . Atrial fibrillation (HCC)   . Acute cerebral infarction (HCC)   . Non-ST elevation (NSTEMI) myocardial infarction (HCC)   . Non-ST elevation MI (NSTEMI) (HCC) 06/28/2019  . Cardiac arrest due to underlying cardiac condition (HCC) 06/28/2019  . Cardiac arrest (HCC) 06/28/2019  . Ventilator dependent (HCC)   . PAD (peripheral artery disease) (HCC) 01/11/2019  . Acute cystitis without hematuria 12/31/2018  . Diarrhea 12/31/2018  . CKD (chronic kidney disease) stage 3, GFR 30-59 ml/min 05/27/2018  . History of TIA (transient ischemic attack) 05/27/2018  . Persistent atrial fibrillation (HCC) 08/25/2017  . Osteopenia of multiple sites 11/09/2016  . DNR (do not  resuscitate) 10/31/2016  . Encounter for general adult medical examination without abnormal findings 05/02/2016  . Vaccine counseling 11/01/2015  . Borderline diabetes mellitus 11/03/2013  . Pure hypercholesterolemia 11/03/2013  . Atrial fibrillation with RVR (HCC) 09/14/2013  . Acquired hypothyroidism 09/14/2013  . Diverticulosis 09/14/2013  . Essential hypertension 09/14/2013  . GERD without esophagitis 09/14/2013  . Mitral valve prolapse 09/14/2013  . H/O cardiac catheterization 08/12/2008    Ezekiel Ina, [place] DPT 07/27/2019, 10:33 AM  [facility] MAIN [facility] SERVICES [address] [place], [place], [address] Phone: [phone]   Fax:  [phone]  Name: Courtney Chan MRN: [id number] Date of Birth: Jun 20, 1930

## 2019-07-29 ENCOUNTER — Ambulatory Visit: Payer: Medicare PPO | Admitting: Occupational Therapy

## 2019-07-29 ENCOUNTER — Encounter: Payer: Self-pay | Admitting: Occupational Therapy

## 2019-07-29 ENCOUNTER — Other Ambulatory Visit: Payer: Self-pay

## 2019-07-29 ENCOUNTER — Encounter: Payer: Self-pay | Admitting: Physical Therapy

## 2019-07-29 ENCOUNTER — Ambulatory Visit: Payer: Medicare PPO | Admitting: Physical Therapy

## 2019-07-29 DIAGNOSIS — R278 Other lack of coordination: Secondary | ICD-10-CM

## 2019-07-29 DIAGNOSIS — I639 Cerebral infarction, unspecified: Secondary | ICD-10-CM | POA: Diagnosis not present

## 2019-07-29 DIAGNOSIS — I63512 Cerebral infarction due to unspecified occlusion or stenosis of left middle cerebral artery: Secondary | ICD-10-CM

## 2019-07-29 DIAGNOSIS — R262 Difficulty in walking, not elsewhere classified: Secondary | ICD-10-CM

## 2019-07-29 DIAGNOSIS — R2689 Other abnormalities of gait and mobility: Secondary | ICD-10-CM

## 2019-07-29 DIAGNOSIS — M6281 Muscle weakness (generalized): Secondary | ICD-10-CM

## 2019-07-29 NOTE — Therapy (Signed)
[facility] MAIN [facility] SERVICES [address] [place], [place], [address] Phone: [phone]   Fax:  [phone]  Occupational Therapy Treatment  Patient Details  Name: Courtney Chan MRN: [id number] Date of Birth: 05-16-1930 No data recorded  Encounter Date: 07/29/2019  OT End of Session - 07/29/19 2053    Visit Number  3    Date for OT Re-Evaluation  09/15/19    Authorization Type  [employer]    OT Start Time  1100    OT Stop Time  1146    OT Time Calculation (min)  46 min    Activity Tolerance  Patient tolerated treatment well    Behavior During Therapy  [facility] for tasks assessed/performed       Past Medical History:  Diagnosis Date  . Atrial fibrillation (HCC) 2011  . Hyperlipidemia   . Hypertension   . Stroke (HCC)   . Thyroid disease     Past Surgical History:  Procedure Laterality Date  . ABDOMINAL HYSTERECTOMY    . BREAST BIOPSY Left 1980   Negative  . CORONARY/GRAFT ACUTE MI REVASCULARIZATION N/A 06/28/2019   Procedure: Coronary/Graft Acute MI Revascularization;  Surgeon: Marykay Lex, MD;  Location: [facility] INVASIVE CV LAB;  Service: Cardiovascular;  Laterality: N/A;  . LEFT HEART CATH AND CORONARY ANGIOGRAPHY N/A 06/28/2019   Procedure: LEFT HEART CATH AND CORONARY ANGIOGRAPHY;  Surgeon: Marykay Lex, MD;  Location: [facility] INVASIVE CV LAB;  Service: Cardiovascular;  Laterality: N/A;    There were no vitals filed for this visit.  Subjective Assessment - 07/29/19 2052    Subjective   Patient reports her daughter is planning on staying a couple more days and then transition back to her home.  Patient reports she feels comfortable with this.    Pertinent History  Patient was in [facility] for 2 weeks with one of those weeks for rehab.No home health.  Came home a week ago last Sunday.    Patient Stated Goals  Patient wants to get stronger and back to normal, be more independent.    Currently in Pain?  Yes    Pain Score  5     Pain Location  Chest    Pain Orientation  Mid    Pain Descriptors / Indicators  Aching    Pain Type  Chronic pain    Pain Onset  More than a month ago    Pain Frequency  Constant         Patient reports she was able to get up out of bed by herself this morning and go to the bathroom.  Made up the bed this morning, dressed self.   Daughter has been cooking meals.   Therapeutic Exercises: Reaching to pickup for placing washers onto hooks, when removing washers, moving items through the hand and then Using hand for storage, performed with cues for prehension patterns.  2# weights biceps curl , supination/pronation, wrist extension/ flexion, UD/RD Difficulty with left proprioceptive skills with movement patterns. 1# chest press  Yellow putty for grip, 3 point pinch and 2 point pinch with cues and therapist demo.  10 reps each  Response to tx: Patient continues to progress with strengthening, coordination and increased participation in self care and IADL tasks at home.  Issued and instructed on HEP for grip and pinch with use of yellow putty.  Still has some chest pain with past broken rib but improving.  Daughter staying a couple more days at night and  then will transition home.  Continue OT towards goals in plan of care to increase independence in necessary daily tasks.                      OT Education - 07/29/19 2053    Education Details  HEP with yellow putty    Person(s) Educated  Patient    Methods  Explanation;Demonstration    Comprehension  Verbalized understanding;Returned demonstration          OT Long Term Goals - 07/21/19 1000      OT LONG TERM GOAL #1   Title  Patient will be independent with home exercise program for strengthening and coordination.    Baseline  no current program    Time  8    Period  Weeks    Status  New    Target Date  09/15/19      OT LONG TERM GOAL #2   Title  Patient will complete upper body and lower body bathing with  modified independence.    Baseline  min assist at eval    Time  8    Period  Weeks    Status  New    Target Date  09/15/19      OT LONG TERM GOAL #3   Title  Patient to perform UB and LB dressing skills with modified independence.    Baseline  moderate assist at evaluation    Time  8    Period  Weeks    Status  New    Target Date  09/15/19      OT LONG TERM GOAL #4   Title  Patient will complete bed making with modified independence.    Baseline  mod assist at eval    Time  4    Period  Weeks    Status  New    Target Date  08/11/19      OT LONG TERM GOAL #5   Title  Patient will complete light meal preparation with modified independence    Baseline  unable at evaluation    Time  8    Period  Weeks    Status  New    Target Date  09/15/19      Long Term Additional Goals   Additional Long Term Goals  Yes      OT LONG TERM GOAL #6   Title  Patient will increase strength in LUE by 1 mm grade to perform homemaking tasks with modified independence.    Baseline  decreased strength in LUE at eval    Time  8    Period  Weeks    Status  New    Target Date  09/15/19            Plan - 07/29/19 2054    Clinical Impression Statement  Patient continues to progress with strengthening, coordination and increased participation in self care and IADL tasks at home.  Issued and instructed on HEP for grip and pinch with use of yellow putty.  Still has some chest pain with past broken rib but improving.  Daughter staying a couple more days at night and then will transition home.  Continue OT towards goals in plan of care to increase independence in necessary daily tasks.    OT Occupational Profile and History  Detailed Assessment- Review of Records and additional review of physical, cognitive, psychosocial history related to current functional performance    Occupational performance deficits (Please refer  to evaluation for details):  ADL's;IADL's;Leisure    Body Structure / Function /  Physical Skills  ADL;Coordination;Endurance;GMC;UE functional use;Balance;Decreased knowledge of use of DME;Flexibility;IADL;Pain;Dexterity;FMC;Strength;Proprioception;Mobility;ROM    Psychosocial Skills  Environmental  Adaptations;Routines and Behaviors;Habits    Rehab Potential  Good    Clinical Decision Making  Limited treatment options, no task modification necessary    Comorbidities Affecting Occupational Performance:  May have comorbidities impacting occupational performance    Modification or Assistance to Complete Evaluation   No modification of tasks or assist necessary to complete eval    OT Frequency  2x / week    OT Duration  8 weeks    OT Treatment/Interventions  Self-care/ADL training;Cryotherapy;Therapeutic exercise;DME and/or AE instruction;Functional Mobility Training;Balance training;Neuromuscular education;Manual Therapy;Moist Heat;Contrast Bath;Energy conservation;Passive range of motion;Therapeutic activities;Patient/family education    Consulted and Agree with Plan of Care  Patient       Patient will benefit from skilled therapeutic intervention in order to improve the following deficits and impairments:   Body Structure / Function / Physical Skills: ADL, Coordination, Endurance, GMC, UE functional use, Balance, Decreased knowledge of use of DME, Flexibility, IADL, Pain, Dexterity, FMC, Strength, Proprioception, Mobility, ROM   Psychosocial Skills: Environmental  Adaptations, Routines and Behaviors, Habits   Visit Diagnosis: Muscle weakness (generalized)  Other lack of coordination  Left middle cerebral artery stroke [facility])    Problem List Patient Active Problem List   Diagnosis Date Noted  . Hypothyroidism   . Chronic kidney disease   . Supplemental oxygen dependent   . 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 Debility 07/04/2019  . CHF (congestive heart failure) ([place])   . Atrial fibrillation ([place])   . Acute cerebral infarction ([place])   .  Non-ST elevation (NSTEMI) myocardial infarction ([place])   . Non-ST elevation MI (NSTEMI) ([place]) 06/28/2019  . Cardiac arrest due to underlying cardiac condition ([place]) 06/28/2019  . Cardiac arrest ([place]) 06/28/2019  . Ventilator dependent ([place])   . PAD (peripheral artery disease) ([place]) 01/11/2019  . Acute cystitis without hematuria 12/31/2018  . Diarrhea 12/31/2018  . CKD (chronic kidney disease) stage 3, GFR 30-59 ml/min 05/27/2018  . History of TIA (transient ischemic attack) 05/27/2018  . Persistent atrial fibrillation ([place]) 08/25/2017  . Osteopenia of multiple sites 11/09/2016  . DNR (do not resuscitate) 10/31/2016  . Encounter for general adult medical examination without abnormal findings 05/02/2016  . Vaccine counseling 11/01/2015  . Borderline diabetes mellitus 11/03/2013  . Pure hypercholesterolemia 11/03/2013  . Atrial fibrillation with RVR ([place]) 09/14/2013  . Acquired hypothyroidism 09/14/2013  . Diverticulosis 09/14/2013  . Essential hypertension 09/14/2013  . GERD without esophagitis 09/14/2013  . Mitral valve prolapse 09/14/2013  . H/O cardiac catheterization 08/12/2008   Achilles Dunk, OTR/L, CLT  Riven Mabile 07/29/2019, 9:15 PM  [place] MAIN [facility] SERVICES [address] [place], [place], [address] Phone: [phone]   Fax:  [phone]  Name: Courtney Chan MRN: [id number] Date of Birth: April 08, 1931

## 2019-07-29 NOTE — Therapy (Signed)
[place] [facility] MAIN [facility] SERVICES [address] [place], [place], [address] Phone: [phone]   Fax:  [phone]  Occupational Therapy Treatment  Patient Details  Name: Courtney Chan MRN: [id number] Date of Birth: 1930-07-17 No data recorded  Encounter Date: 07/27/2019  OT End of Session - 07/29/19 1249    Visit Number  2    Number of Visits  16    Date for OT Re-Evaluation  09/15/19    Authorization Type  [employer]    OT Start Time  1100    OT Stop Time  1144    OT Time Calculation (min)  44 min    Activity Tolerance  Patient tolerated treatment well    Behavior During Therapy  [facility] for tasks assessed/performed       Past Medical History:  Diagnosis Date  . Atrial fibrillation (HCC) 2011  . Hyperlipidemia   . Hypertension   . Stroke (HCC)   . Thyroid disease     Past Surgical History:  Procedure Laterality Date  . ABDOMINAL HYSTERECTOMY    . BREAST BIOPSY Left 1980   Negative  . CORONARY/GRAFT ACUTE MI REVASCULARIZATION N/A 06/28/2019   Procedure: Coronary/Graft Acute MI Revascularization;  Surgeon: Marykay Lex, MD;  Location: [facility] INVASIVE CV LAB;  Service: Cardiovascular;  Laterality: N/A;  . LEFT HEART CATH AND CORONARY ANGIOGRAPHY N/A 06/28/2019   Procedure: LEFT HEART CATH AND CORONARY ANGIOGRAPHY;  Surgeon: Marykay Lex, MD;  Location: [facility] INVASIVE CV LAB;  Service: Cardiovascular;  Laterality: N/A;    There were no vitals filed for this visit.  Subjective Assessment - 07/29/19 1237    Subjective   Patient reports she is doing well, her daughter is still staying with her but is trying to transition and let patient do more throughout the day.    Pertinent History  Patient was in [facility] for 2 weeks with one of those weeks for rehab.No home health.  Came home a week ago last Sunday.    Patient Stated Goals  Patient wants to get stronger and back to normal, be more independent.    Currently in Pain?  Yes    Pain  Score  5     Pain Location  Chest    Pain Orientation  Mid    Pain Descriptors / Indicators  Aching    Pain Type  Chronic pain    Pain Onset  1 to 4 weeks ago    Pain Frequency  Constant    Multiple Pain Sites  No       Therapeutic Exercise:  Patient seen this date for 1# dowel exercises for shoulder flexion to 90 degrees, chest press, shoulder ABD/ADD, elbow flexion/extension for 10 repetitions for 2 sets each.  Therapist demo and cues for form and technique.  No overhead reach due to chest pain from broken rib.  Grip strengthening bilaterally with 6# for 20 reps and then 11# for 10 reps, left hand slightly weaker than right. Cues for technique, some motor planning and proprioception issues on left when performing exercises.   Neuromuscular Reeducation: Patient seen for manipulation skills of the hand, bilateral hand use during task but focused more on left than right.  Picking up small glass beads from flat tabletop, some with flat side up and others with flat side down.  Once patient was able to establish pattern of picking up, added translatory skills of the hand to move pieces through the hand to the palm and  using the hand for storage.  Moved items back to tip of fingers to place into container.  Cues for prehension patterns and technique.   Response to tx:   Patient with some chest pain due to broken rib after heart attack/CPR.  She continues to progress and has participated more with self care tasks in the last few days.  She continues to demonstrate muscle weakness, decreased coordination and decreased proprioception with movements on the left side.  Continue to work towards goals in plan of care to increase safety and independence in daily tasks and decrease caregiver assistance at home.                       OT Education - 07/29/19 1249    Education Details  HEP    Person(s) Educated  Patient    Methods  Explanation;Demonstration    Comprehension  Verbalized  understanding;Returned demonstration          OT Long Term Goals - 07/21/19 1000      OT LONG TERM GOAL #1   Title  Patient will be independent with home exercise program for strengthening and coordination.    Baseline  no current program    Time  8    Period  Weeks    Status  New    Target Date  09/15/19      OT LONG TERM GOAL #2   Title  Patient will complete upper body and lower body bathing with modified independence.    Baseline  min assist at eval    Time  8    Period  Weeks    Status  New    Target Date  09/15/19      OT LONG TERM GOAL #3   Title  Patient to perform UB and LB dressing skills with modified independence.    Baseline  moderate assist at evaluation    Time  8    Period  Weeks    Status  New    Target Date  09/15/19      OT LONG TERM GOAL #4   Title  Patient will complete bed making with modified independence.    Baseline  mod assist at eval    Time  4    Period  Weeks    Status  New    Target Date  08/11/19      OT LONG TERM GOAL #5   Title  Patient will complete light meal preparation with modified independence    Baseline  unable at evaluation    Time  8    Period  Weeks    Status  New    Target Date  09/15/19      Long Term Additional Goals   Additional Long Term Goals  Yes      OT LONG TERM GOAL #6   Title  Patient will increase strength in LUE by 1 mm grade to perform homemaking tasks with modified independence.    Baseline  decreased strength in LUE at eval    Time  8    Period  Weeks    Status  New    Target Date  09/15/19            Plan - 07/29/19 1249    Clinical Impression Statement  Patient with some chest pain due to broken rib after heart attack/CPR.  She continues to progress and has participated more with self care tasks in the last few  days.  She continues to demonstrate muscle weakness, decreased coordination and decreased proprioception with movements on the left side.  Continue to work towards goals in plan of  care to increase safety and independence in daily tasks and decrease caregiver assistance at home.    OT Occupational Profile and History  Detailed Assessment- Review of Records and additional review of physical, cognitive, psychosocial history related to current functional performance    Occupational performance deficits (Please refer to evaluation for details):  ADL's;IADL's;Leisure    Body Structure / Function / Physical Skills  ADL;Coordination;Endurance;GMC;UE functional use;Balance;Decreased knowledge of use of DME;Flexibility;IADL;Pain;Dexterity;FMC;Strength;Proprioception;Mobility;ROM    Psychosocial Skills  Environmental  Adaptations;Routines and Behaviors;Habits    Rehab Potential  Good    Clinical Decision Making  Limited treatment options, no task modification necessary    Comorbidities Affecting Occupational Performance:  May have comorbidities impacting occupational performance    Modification or Assistance to Complete Evaluation   No modification of tasks or assist necessary to complete eval    OT Frequency  2x / week    OT Duration  8 weeks    OT Treatment/Interventions  Self-care/ADL training;Cryotherapy;Therapeutic exercise;DME and/or AE instruction;Functional Mobility Training;Balance training;Neuromuscular education;Manual Therapy;Moist Heat;Contrast Bath;Energy conservation;Passive range of motion;Therapeutic activities;Patient/family education    Consulted and Agree with Plan of Care  Patient;Family member/caregiver       Patient will benefit from skilled therapeutic intervention in order to improve the following deficits and impairments:   Body Structure / Function / Physical Skills: ADL, Coordination, Endurance, GMC, UE functional use, Balance, Decreased knowledge of use of DME, Flexibility, IADL, Pain, Dexterity, FMC, Strength, Proprioception, Mobility, ROM   Psychosocial Skills: Environmental  Adaptations, Routines and Behaviors, Habits   Visit Diagnosis: Muscle  weakness (generalized)  Other lack of coordination  Left middle cerebral artery stroke [facility])    Problem List Patient Active Problem List   Diagnosis Date Noted  . Hypothyroidism   . Chronic kidney disease   . Supplemental oxygen dependent   . 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 Debility 07/04/2019  . CHF (congestive heart failure) (HCC)   . Atrial fibrillation (HCC)   . Acute cerebral infarction (HCC)   . Non-ST elevation (NSTEMI) myocardial infarction (HCC)   . Non-ST elevation MI (NSTEMI) (HCC) 06/28/2019  . Cardiac arrest due to underlying cardiac condition (HCC) 06/28/2019  . Cardiac arrest (HCC) 06/28/2019  . Ventilator dependent (HCC)   . PAD (peripheral artery disease) (HCC) 01/11/2019  . Acute cystitis without hematuria 12/31/2018  . Diarrhea 12/31/2018  . CKD (chronic kidney disease) stage 3, GFR 30-59 ml/min 05/27/2018  . History of TIA (transient ischemic attack) 05/27/2018  . Persistent atrial fibrillation (HCC) 08/25/2017  . Osteopenia of multiple sites 11/09/2016  . DNR (do not resuscitate) 10/31/2016  . Encounter for general adult medical examination without abnormal findings 05/02/2016  . Vaccine counseling 11/01/2015  . Borderline diabetes mellitus 11/03/2013  . Pure hypercholesterolemia 11/03/2013  . Atrial fibrillation with RVR (HCC) 09/14/2013  . Acquired hypothyroidism 09/14/2013  . Diverticulosis 09/14/2013  . Essential hypertension 09/14/2013  . GERD without esophagitis 09/14/2013  . Mitral valve prolapse 09/14/2013  . H/O cardiac catheterization 08/12/2008   Kerrie Buffalo, OTR/L, CLT  Courtney Chan 07/29/2019, 1:52 PM  [place] [facility] MAIN [facility] SERVICES [address] [place], [place], [address] Phone: [phone]   Fax:  [phone]  Name: Courtney Chan MRN: [id number] Date of Birth: 1931-03-15

## 2019-07-29 NOTE — Therapy (Signed)
[place] [facility] MAIN [facility] SERVICES [address] [place], [place], [address] Phone: [phone]   Fax:  [phone]  Physical Therapy Treatment  Patient Details  Name: Courtney Chan MRN: [id number] Date of Birth: 10-04-1930 Referring Provider (PT): Roque Lias   Encounter Date: 07/29/2019  PT End of Session - 07/29/19 1020    Visit Number  4    Number of Visits  17    Date for PT Re-Evaluation  09/14/19    PT Start Time  1016    PT Stop Time  1055    PT Time Calculation (min)  39 min    Equipment Utilized During Treatment  Gait belt    Activity Tolerance  Patient tolerated treatment well       Past Medical History:  Diagnosis Date  . Atrial fibrillation (HCC) 2011  . Hyperlipidemia   . Hypertension   . Stroke (HCC)   . Thyroid disease     Past Surgical History:  Procedure Laterality Date  . ABDOMINAL HYSTERECTOMY    . BREAST BIOPSY Left 1980   Negative  . CORONARY/GRAFT ACUTE MI REVASCULARIZATION N/A 06/28/2019   Procedure: Coronary/Graft Acute MI Revascularization;  Surgeon: Marykay Lex, MD;  Location: [facility] INVASIVE CV LAB;  Service: Cardiovascular;  Laterality: N/A;  . LEFT HEART CATH AND CORONARY ANGIOGRAPHY N/A 06/28/2019   Procedure: LEFT HEART CATH AND CORONARY ANGIOGRAPHY;  Surgeon: Marykay Lex, MD;  Location: [facility] INVASIVE CV LAB;  Service: Cardiovascular;  Laterality: N/A;    There were no vitals filed for this visit.  Subjective Assessment - 07/29/19 1020    Subjective  Patient is having 7/10 chest pain today.    Pertinent History  Courtney Chan is a 86 y.o. right-handed female with history of atrial fibrillation followed by Dr.Paraschos at [place] [facility], hyperlipidemia, hypertension. Patient lives with spouse independent prior to admission noted some recent cognitive decline.  Presented 06/28/2019 with chest pain at [facility].   She is found to have non-STEMI with PCI completed per cardiology services.  On  06/29/2019 after extubation altered mental status not following commands.  MRI showed small acute infarction.  Per report small acute infarct involving the posterior aspect of the left superior frontal gyrus.  She went to inpatient rehab for a week and now is back at her homoe.    How long can you sit comfortably?  unlimited    How long can you stand comfortably?  10    How long can you walk comfortably?  10    Patient Stated Goals  to walk better.    Currently in Pain?  Yes    Pain Score  7     Pain Location  Chest    Pain Descriptors / Indicators  Aching    Pain Onset  1 to 4 weeks ago       Neuromuscular Re-education  Rocker board fwd/bwd, side to side x 20 each direction Tandem gait on 2"x4" without UE support x 2 lengths Side stepping on 2"x4" without UE support x 2 lengths Heel/toe raises without UE support 3s hold x 10 each 1/2 foam roll balance with flat side up 30s x 2 reps 1/2 foam roll balance with flat side down 30s x 2 reps 1/2 foam roll tandem balance alternating forward LE 30s x 2 each LE forward Lateral side steps from foam to 6 inch stool left and right x 15 Backwards stepping from foam to 6 inch stool x 15  Ther-ex  Hip flexion marches with 2# ankle weights x 10 bilateral; Hip abduction with 2# x 10 bilateral; Hip extension with 2# x 10 bilateral; Sit to stand without UE support from regular height chair x 10   Squats x 15 with cues for correct posture Lunges to BOSU ball x 15 BLE Heel raises x 15 x 2 sets High marching x 20 BLE Step ups to 6-inch stool x 20  Eccentric step downs from 3 inch stool x 10 verbal cues to complete slow and tap heel.  BUE CGA Patient needs occasional verbal cueing to improve posture and cueing to correctly perform exercises slowly, holding at end of range to increase motor firing of desired muscle to encourage fatigue.        Pt educated throughout session about proper posture and technique with exercises. Improved exercise technique,  movement at target joints, use of target muscles after min to mod verbal, visual, tactile cues. CGA and Min to mod verbal cues used throughout with increased in postural sway and LOB most seen with narrow base of support and while on uneven surfaces. Continues to have balance deficits typical with diagnosis. Patient performs intermediate level exercises without pain behaviors and needs verbal cuing for postural alignment and head positioning                       PT Education - 07/29/19 1020    Education Details  HEP    Person(s) Educated  Patient    Methods  Explanation    Comprehension  Verbalized understanding;Returned demonstration;Tactile cues required;Need further instruction       PT Short Term Goals - 07/20/19 1542      PT SHORT TERM GOAL #1   Title  Patient will be independent in home exercise program to improve strength/mobility for better functional independence with ADLs.    Time  4    Period  Weeks    Status  New    Target Date  08/17/19      PT SHORT TERM GOAL #2   Title  Patient (> 84 years old) will complete five times sit to stand test in < 15 seconds indicating an increased LE strength and improved balance.    Time  4    Period  Weeks    Status  New    Target Date  08/17/19        PT Long Term Goals - 07/20/19 1546      PT LONG TERM GOAL #1   Title  Patient will increase Berg Balance score by > 6 points to demonstrate decreased fall risk during functional activities.    Time  8    Period  Weeks    Status  New    Target Date  09/14/19      PT LONG TERM GOAL #2   Title  Patient will increase six minute walk test distance to >1000 for progression to community ambulator and improve gait ability    Time  8    Period  Weeks    Status  New    Target Date  09/14/19      PT LONG TERM GOAL #3   Title  Patient will increase 10 meter walk test to >1.22m/s as to improve gait speed for better community ambulation and to reduce fall risk.    Time   8    Period  Weeks    Status  New    Target Date  09/14/19      PT LONG TERM GOAL #4   Title  Patient will reduce timed up and go to <11 seconds to reduce fall risk and demonstrate improved transfer/gait ability.    Time  8    Period  Weeks    Status  New    Target Date  09/14/19            Plan - 07/29/19 1021    Clinical Impression Statement  Patient demonstrates decreased gait speed with deviations and requires verbal and tactile cueing to maintain center of gravity during balance challenges..  Patient also requires CGA during all dynamic standing balance activities. Patient requires consistent cueing to maintain correct position during gait activities. Patient demonstrates difficulty with gait and increased postural sway while on rollator.  Patient will continue to benefit from skilled therapy in order to improve dynamic standing balance and increase endurance   Personal Factors and Comorbidities  Age    Examination-Participation Restrictions  Cleaning;Driving    Stability/Clinical Decision Making  Stable/Uncomplicated    Rehab Potential  Good    PT Frequency  2x / week    PT Duration  8 weeks    PT Treatment/Interventions  Gait training;Therapeutic exercise;Balance training;Neuromuscular re-education;Therapeutic activities;Functional mobility training;Moist Heat;Patient/family education    PT Next Visit Plan  strengthening and balance training    PT Home Exercise Plan  RTB hip abd and ext in standing, heel raises    Consulted and Agree with Plan of Care  Patient;Family member/caregiver       Patient will benefit from skilled therapeutic intervention in order to improve the following deficits and impairments:  Abnormal gait, Difficulty walking, Decreased strength, Decreased balance, Pain, Decreased activity tolerance  Visit Diagnosis: Muscle weakness (generalized)  Other lack of coordination  Left middle cerebral artery stroke (HCC)  Acute cerebral infarction  (HCC)  Other abnormalities of gait and mobility  Difficulty in walking, not elsewhere classified     Problem List Patient Active Problem List   Diagnosis Date Noted  . Hypothyroidism   . Chronic kidney disease   . Supplemental oxygen dependent   . 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 Debility 07/04/2019  . CHF (congestive heart failure) (HCC)   . Atrial fibrillation (HCC)   . Acute cerebral infarction (HCC)   . Non-ST elevation (NSTEMI) myocardial infarction (HCC)   . Non-ST elevation MI (NSTEMI) (HCC) 06/28/2019  . Cardiac arrest due to underlying cardiac condition (HCC) 06/28/2019  . Cardiac arrest (HCC) 06/28/2019  . Ventilator dependent (HCC)   . PAD (peripheral artery disease) (HCC) 01/11/2019  . Acute cystitis without hematuria 12/31/2018  . Diarrhea 12/31/2018  . CKD (chronic kidney disease) stage 3, GFR 30-59 ml/min 05/27/2018  . History of TIA (transient ischemic attack) 05/27/2018  . Persistent atrial fibrillation (HCC) 08/25/2017  . Osteopenia of multiple sites 11/09/2016  . DNR (do not resuscitate) 10/31/2016  . Encounter for general adult medical examination without abnormal findings 05/02/2016  . Vaccine counseling 11/01/2015  . Borderline diabetes mellitus 11/03/2013  . Pure hypercholesterolemia 11/03/2013  . Atrial fibrillation with RVR (HCC) 09/14/2013  . Acquired hypothyroidism 09/14/2013  . Diverticulosis 09/14/2013  . Essential hypertension 09/14/2013  . GERD without esophagitis 09/14/2013  . Mitral valve prolapse 09/14/2013  . H/O cardiac catheterization 08/12/2008    Jones Broom SPT DPT 07/29/2019, 10:22 AM  [place] [facility] MAIN [facility] SERVICES [address] [place], [place], [address] Phone: [phone]   Fax:  [phone]  Name: Courtney Chan MRN: [id number] Date of Birth: 1930-10-04

## 2019-08-03 ENCOUNTER — Ambulatory Visit: Payer: Medicare PPO | Admitting: Occupational Therapy

## 2019-08-03 ENCOUNTER — Encounter: Payer: Self-pay | Admitting: Occupational Therapy

## 2019-08-03 ENCOUNTER — Ambulatory Visit: Payer: Medicare PPO | Admitting: Physical Therapy

## 2019-08-03 ENCOUNTER — Encounter: Payer: Self-pay | Admitting: Physical Therapy

## 2019-08-03 ENCOUNTER — Other Ambulatory Visit: Payer: Self-pay

## 2019-08-03 DIAGNOSIS — I63512 Cerebral infarction due to unspecified occlusion or stenosis of left middle cerebral artery: Secondary | ICD-10-CM

## 2019-08-03 DIAGNOSIS — I639 Cerebral infarction, unspecified: Secondary | ICD-10-CM | POA: Diagnosis not present

## 2019-08-03 DIAGNOSIS — R262 Difficulty in walking, not elsewhere classified: Secondary | ICD-10-CM

## 2019-08-03 DIAGNOSIS — M6281 Muscle weakness (generalized): Secondary | ICD-10-CM

## 2019-08-03 DIAGNOSIS — R2689 Other abnormalities of gait and mobility: Secondary | ICD-10-CM

## 2019-08-03 DIAGNOSIS — R278 Other lack of coordination: Secondary | ICD-10-CM

## 2019-08-03 NOTE — Therapy (Signed)
[place] [facility] MAIN [facility] SERVICES [address] [place], [place], [address] Phone: [phone]   Fax:  [phone]  Occupational Therapy Treatment  Patient Details  Name: Courtney Chan MRN: [id number] Date of Birth: March 10, 1931 No data recorded  Encounter Date: 08/03/2019  OT End of Session - 08/03/19 0934    Visit Number  4    Number of Visits  16    Date for OT Re-Evaluation  09/15/19    Authorization Type  [employer]    OT Start Time  0930    OT Stop Time  1015    OT Time Calculation (min)  45 min    Activity Tolerance  Patient tolerated treatment well    Behavior During Therapy  [facility] for tasks assessed/performed       Past Medical History:  Diagnosis Date  . Atrial fibrillation (HCC) 2011  . Hyperlipidemia   . Hypertension   . Stroke (HCC)   . Thyroid disease     Past Surgical History:  Procedure Laterality Date  . ABDOMINAL HYSTERECTOMY    . BREAST BIOPSY Left 1980   Negative  . CORONARY/GRAFT ACUTE MI REVASCULARIZATION N/A 06/28/2019   Procedure: Coronary/Graft Acute MI Revascularization;  Surgeon: Marykay Lex, MD;  Location: [facility] INVASIVE CV LAB;  Service: Cardiovascular;  Laterality: N/A;  . LEFT HEART CATH AND CORONARY ANGIOGRAPHY N/A 06/28/2019   Procedure: LEFT HEART CATH AND CORONARY ANGIOGRAPHY;  Surgeon: Marykay Lex, MD;  Location: [facility] INVASIVE CV LAB;  Service: Cardiovascular;  Laterality: N/A;    There were no vitals filed for this visit.  Subjective Assessment - 08/03/19 0933    Subjective   Pt. reports that her stomach is bothering her today    Patient is accompanied by:  Family member    Pertinent History  Patient was in [place] for 2 weeks with one of those weeks for rehab.No home health.  Came home a week ago last Sunday.    Patient Stated Goals  Patient wants to get stronger and back to normal, be more independent.    Currently in Pain?  Yes    Pain Score  4     Pain Location  --   Stomach   Pain  Orientation  Mid    Pain Descriptors / Indicators  Aching      OT TREATMENT    Neuro muscular re-education:  Pt. worked on grasping, and manipulating 1/2" washers from a magnetic dish using point grasp pattern. Pt. worked on reaching up, stabilizing, and sustaining shoulder elevation while placing the washer over a small precise target on vertical dowels positioned at various angles. Pt. dropped multiple washers when attempting to perform translatory movements. Pt. worked on [facility] skills grasping 1" sticks, 1/4" collars, and 1/4" washers. Pt. dropped multiple 1/4" collars when attempting to grasp, and manipulate them. Pt. Worked on bilateral alternating hand movements removing the 1" sticks.  Therapeutic Exercise:  Pt. performed 1.5# dowel ex. For UE strengthening secondary to weakness. Chest press, and circular patterns were performed. 2# dumbbell ex. for elbow flexion and extension, forearm supination/pronation, and wrist flexion/extension. Pt. requires rest breaks and verbal cues for proper technique.  Response to Treatment:  Pt. was able to tolerate increased weight of 1.5# today. Pt. requires verbal cues, tactile cues, and cues for visual demonstration prior to, and during the exercises for proper movement patterns, and form. Pt. Requires restbreaks.  Pt. requires cues for motor planning through the fine motor movements using  the left hand. Pt. Continues  to work on improving LUE, coordination, and functioning in order to be able to increase, and improve engagement of the LUE during ADLs, and IADL tasks.                        OT Education - 08/03/19 0934    Education Details  Ther. Ex    Person(s) Educated  Patient    Methods  Explanation;Demonstration    Comprehension  Verbalized understanding;Returned demonstration          OT Long Term Goals - 07/21/19 1000      OT LONG TERM GOAL #1   Title  Patient will be independent with home exercise program for  strengthening and coordination.    Baseline  no current program    Time  8    Period  Weeks    Status  New    Target Date  09/15/19      OT LONG TERM GOAL #2   Title  Patient will complete upper body and lower body bathing with modified independence.    Baseline  min assist at eval    Time  8    Period  Weeks    Status  New    Target Date  09/15/19      OT LONG TERM GOAL #3   Title  Patient to perform UB and LB dressing skills with modified independence.    Baseline  moderate assist at evaluation    Time  8    Period  Weeks    Status  New    Target Date  09/15/19      OT LONG TERM GOAL #4   Title  Patient will complete bed making with modified independence.    Baseline  mod assist at eval    Time  4    Period  Weeks    Status  New    Target Date  08/11/19      OT LONG TERM GOAL #5   Title  Patient will complete light meal preparation with modified independence    Baseline  unable at evaluation    Time  8    Period  Weeks    Status  New    Target Date  09/15/19      Long Term Additional Goals   Additional Long Term Goals  Yes      OT LONG TERM GOAL #6   Title  Patient will increase strength in LUE by 1 mm grade to perform homemaking tasks with modified independence.    Baseline  decreased strength in LUE at eval    Time  8    Period  Weeks    Status  New    Target Date  09/15/19            Plan - 08/03/19 0935    Clinical Impression Statement Pt. was able to tolerate increased weight of 1.5# today. Pt. requires verbal cues, tactile cues, and cues for visual demonstration prior to, and during the exercises for proper movement patterns, and form. Pt. Requires restbreaks.  Pt. requires cues for motor planning through the fine motor movements using the left hand. Pt. Continues  to work on improving LUE, coordination, and functioning in order to be able to increase, and improve engagement of the LUE during ADLs, and IADL tasks.   OT Occupational Profile and  History  Detailed Assessment- Review of Records and additional review of  physical, cognitive, psychosocial history related to current functional performance    Occupational performance deficits (Please refer to evaluation for details):  ADL's;IADL's;Leisure    Body Structure / Function / Physical Skills  ADL;Coordination;Endurance;GMC;UE functional use;Balance;Decreased knowledge of use of DME;Flexibility;IADL;Pain;Dexterity;FMC;Strength;Proprioception;Mobility;ROM    Psychosocial Skills  Environmental  Adaptations;Routines and Behaviors;Habits    Rehab Potential  Good    Comorbidities Affecting Occupational Performance:  May have comorbidities impacting occupational performance    Modification or Assistance to Complete Evaluation   No modification of tasks or assist necessary to complete eval    OT Frequency  2x / week    OT Duration  8 weeks    OT Treatment/Interventions  Self-care/ADL training;Cryotherapy;Therapeutic exercise;DME and/or AE instruction;Functional Mobility Training;Balance training;Neuromuscular education;Manual Therapy;Moist Heat;Contrast Bath;Energy conservation;Passive range of motion;Therapeutic activities;Patient/family education    Consulted and Agree with Plan of Care  Patient    Family Member Consulted  daughter       Patient will benefit from skilled therapeutic intervention in order to improve the following deficits and impairments:   Body Structure / Function / Physical Skills: ADL, Coordination, Endurance, GMC, UE functional use, Balance, Decreased knowledge of use of DME, Flexibility, IADL, Pain, Dexterity, FMC, Strength, Proprioception, Mobility, ROM   Psychosocial Skills: Environmental  Adaptations, Routines and Behaviors, Habits   Visit Diagnosis: Muscle weakness (generalized)  Other lack of coordination    Problem List Patient Active Problem List   Diagnosis Date Noted  . Hypothyroidism   . Chronic kidney disease   . Supplemental oxygen dependent    . Left middle cerebral artery stroke ([place]) 07/04/2019  . Debility 07/04/2019  . CHF (congestive heart failure) ([place])   . Atrial fibrillation ([place])   . Acute cerebral infarction ([place])   . Non-ST elevation (NSTEMI) myocardial infarction ([place])   . Non-ST elevation MI (NSTEMI) ([place]) 06/28/2019  . Cardiac arrest due to underlying cardiac condition ([place]) 06/28/2019  . Cardiac arrest ([place]) 06/28/2019  . Ventilator dependent ([place])   . PAD (peripheral artery disease) ([place]) 01/11/2019  . Acute cystitis without hematuria 12/31/2018  . Diarrhea 12/31/2018  . CKD (chronic kidney disease) stage 3, GFR 30-59 ml/min 05/27/2018  . History of TIA (transient ischemic attack) 05/27/2018  . Persistent atrial fibrillation ([place]) 08/25/2017  . Osteopenia of multiple sites 11/09/2016  . DNR (do not resuscitate) 10/31/2016  . Encounter for general adult medical examination without abnormal findings 05/02/2016  . Vaccine counseling 11/01/2015  . Borderline diabetes mellitus 11/03/2013  . Pure hypercholesterolemia 11/03/2013  . Atrial fibrillation with RVR ([place]) 09/14/2013  . Acquired hypothyroidism 09/14/2013  . Diverticulosis 09/14/2013  . Essential hypertension 09/14/2013  . GERD without esophagitis 09/14/2013  . Mitral valve prolapse 09/14/2013  . H/O cardiac catheterization 08/12/2008    Harrel Carina, MS, OTR/L 08/03/2019, 9:39 AM  [place] MAIN [facility] SERVICES [address] [place], [place], [address] Phone: [phone]   Fax:  [phone]  Name: CINDERELLA CHRISTOFFERSEN MRN: [id number] Date of Birth: 1930-12-27

## 2019-08-03 NOTE — Therapy (Signed)
[place] [facility] MAIN [facility] SERVICES [address] [place], [place], [address] Phone: [phone]   Fax:  [phone]  Physical Therapy Treatment  Patient Details  Name: Courtney Chan MRN: [id number] Date of Birth: 1930/05/26 Referring Provider (PT): Roque Lias   Encounter Date: 08/03/2019  PT End of Session - 08/03/19 1057    Visit Number  5    Number of Visits  17    Date for PT Re-Evaluation  09/14/19    PT Start Time  1020    PT Stop Time  1100    PT Time Calculation (min)  40 min    Equipment Utilized During Treatment  Gait belt    Activity Tolerance  Patient tolerated treatment well       Past Medical History:  Diagnosis Date  . Atrial fibrillation (HCC) 2011  . Hyperlipidemia   . Hypertension   . Stroke (HCC)   . Thyroid disease     Past Surgical History:  Procedure Laterality Date  . ABDOMINAL HYSTERECTOMY    . BREAST BIOPSY Left 1980   Negative  . CORONARY/GRAFT ACUTE MI REVASCULARIZATION N/A 06/28/2019   Procedure: Coronary/Graft Acute MI Revascularization;  Surgeon: Marykay Lex, MD;  Location: [facility] INVASIVE CV LAB;  Service: Cardiovascular;  Laterality: N/A;  . LEFT HEART CATH AND CORONARY ANGIOGRAPHY N/A 06/28/2019   Procedure: LEFT HEART CATH AND CORONARY ANGIOGRAPHY;  Surgeon: Marykay Lex, MD;  Location: [facility] INVASIVE CV LAB;  Service: Cardiovascular;  Laterality: N/A;    There were no vitals filed for this visit.  Subjective Assessment - 08/03/19 1020    Subjective  Patient is having no  chest pain today.    Pertinent History  Courtney Chan is a 86 y.o. right-handed female with history of atrial fibrillation followed by Dr.Paraschos at [facility], hyperlipidemia, hypertension. Patient lives with spouse independent prior to admission noted some recent cognitive decline.  Presented 06/28/2019 with chest pain at [facility].   She is found to have non-STEMI with PCI completed per cardiology services.  On  06/29/2019 after extubation altered mental status not following commands.  MRI showed small acute infarction.  Per report small acute infarct involving the posterior aspect of the left superior frontal gyrus.  She went to inpatient rehab for a week and now is back at her homoe.    How long can you sit comfortably?  unlimited    How long can you stand comfortably?  10    How long can you walk comfortably?  10    Patient Stated Goals  to walk better.    Currently in Pain?  No/denies    Pain Score  0-No pain    Pain Onset  More than a month ago     Treatment: Nu-step x 5 mis  Neuromuscular Re-education  Tandem stand without UE support x 2 mins Side stepping on 2"x4" without UE support x 2 lengths Heel/toe raises without UE support 3s hold x 10 each 1/2 foam roll balance with flat side up 30s x 2 reps 1/2 foam roll balance with flat side down 30s x 2 reps 1/2 foam roll tandem balance alternating forward LE 30s x 2 each LE forward Lateral side steps from foam to 6 inch stool left and right x 15 Backwards stepping from foam to 6 inch stool x 15  [place] fwd/bwd, side to side , diagonal x 10 ,cues for posture and stepping strategies, occasional LOB Star stepping left and right  x 10       Pt educated throughout session about proper posture and technique with exercises. Improved exercise technique, movement at target joints, use of target muscles after min to mod verbal, visual, tactile cues. CGA and Min to mod verbal cues used throughout with increased in postural sway and LOB most seen with narrow base of support and while on uneven surfaces                      PT Education - 08/03/19 1115    Education Details  hep    Person(s) Educated  Patient    Methods  Explanation;Demonstration    Comprehension  Verbalized understanding;Returned demonstration       PT Short Term Goals - 07/20/19 1542      PT SHORT TERM GOAL #1   Title  Patient will be independent in home  exercise program to improve strength/mobility for better functional independence with ADLs.    Time  4    Period  Weeks    Status  New    Target Date  08/17/19      PT SHORT TERM GOAL #2   Title  Patient (> 84 years old) will complete five times sit to stand test in < 15 seconds indicating an increased LE strength and improved balance.    Time  4    Period  Weeks    Status  New    Target Date  08/17/19        PT Long Term Goals - 07/20/19 1546      PT LONG TERM GOAL #1   Title  Patient will increase Berg Balance score by > 6 points to demonstrate decreased fall risk during functional activities.    Time  8    Period  Weeks    Status  New    Target Date  09/14/19      PT LONG TERM GOAL #2   Title  Patient will increase six minute walk test distance to >1000 for progression to community ambulator and improve gait ability    Time  8    Period  Weeks    Status  New    Target Date  09/14/19      PT LONG TERM GOAL #3   Title  Patient will increase 10 meter walk test to >1.15m/s as to improve gait speed for better community ambulation and to reduce fall risk.    Time  8    Period  Weeks    Status  New    Target Date  09/14/19      PT LONG TERM GOAL #4   Title  Patient will reduce timed up and go to <11 seconds to reduce fall risk and demonstrate improved transfer/gait ability.    Time  8    Period  Weeks    Status  New    Target Date  09/14/19            Plan - 08/03/19 1116    Clinical Impression Statement  Pt demonstrates increased postural sway when standing on uneven surface and requires // bars to steady, and demonstrates fatigue at end of set of exercises focused on strength and endurance.  Patient will continue to benefit from skilled PT for improved balance and strength   Personal Factors and Comorbidities  Age    Examination-Participation Restrictions  Cleaning;Driving    Stability/Clinical Decision Making  Stable/Uncomplicated    Rehab Potential  Good  PT Frequency  2x / week    PT Duration  8 weeks    PT Treatment/Interventions  Gait training;Therapeutic exercise;Balance training;Neuromuscular re-education;Therapeutic activities;Functional mobility training;Moist Heat;Patient/family education    PT Next Visit Plan  strengthening and balance training    PT Home Exercise Plan  RTB hip abd and ext in standing, heel raises    Consulted and Agree with Plan of Care  Patient;Family member/caregiver       Patient will benefit from skilled therapeutic intervention in order to improve the following deficits and impairments:  Abnormal gait, Difficulty walking, Decreased strength, Decreased balance, Pain, Decreased activity tolerance  Visit Diagnosis: Other lack of coordination  Muscle weakness (generalized)  Left middle cerebral artery stroke (HCC)  Acute cerebral infarction (HCC)  Other abnormalities of gait and mobility  Difficulty in walking, not elsewhere classified     Problem List Patient Active Problem List   Diagnosis Date Noted  . Hypothyroidism   . Chronic kidney disease   . Supplemental oxygen dependent   . 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 Debility 07/04/2019  . CHF (congestive heart failure) (HCC)   . Atrial fibrillation (HCC)   . Acute cerebral infarction (HCC)   . Non-ST elevation (NSTEMI) myocardial infarction (HCC)   . Non-ST elevation MI (NSTEMI) (HCC) 06/28/2019  . Cardiac arrest due to underlying cardiac condition (HCC) 06/28/2019  . Cardiac arrest (HCC) 06/28/2019  . Ventilator dependent (HCC)   . PAD (peripheral artery disease) (HCC) 01/11/2019  . Acute cystitis without hematuria 12/31/2018  . Diarrhea 12/31/2018  . CKD (chronic kidney disease) stage 3, GFR 30-59 ml/min 05/27/2018  . History of TIA (transient ischemic attack) 05/27/2018  . Persistent atrial fibrillation (HCC) 08/25/2017  . Osteopenia of multiple sites 11/09/2016  . DNR (do not resuscitate) 10/31/2016  . Encounter  for general adult medical examination without abnormal findings 05/02/2016  . Vaccine counseling 11/01/2015  . Borderline diabetes mellitus 11/03/2013  . Pure hypercholesterolemia 11/03/2013  . Atrial fibrillation with RVR (HCC) 09/14/2013  . Acquired hypothyroidism 09/14/2013  . Diverticulosis 09/14/2013  . Essential hypertension 09/14/2013  . GERD without esophagitis 09/14/2013  . Mitral valve prolapse 09/14/2013  . H/O cardiac catheterization 08/12/2008    Ezekiel Ina, Todd DPT 08/03/2019, 11:17 AM  [place] [facility] MAIN [facility] SERVICES [address] [place], [place], [address] Phone: [phone]   Fax:  [phone]  Name: Courtney Chan MRN: [id number] Date of Birth: [birth year]

## 2019-08-05 ENCOUNTER — Encounter: Payer: Self-pay | Admitting: Physical Therapy

## 2019-08-05 ENCOUNTER — Encounter: Payer: Self-pay | Admitting: Occupational Therapy

## 2019-08-05 ENCOUNTER — Ambulatory Visit: Payer: Medicare PPO | Admitting: Occupational Therapy

## 2019-08-05 ENCOUNTER — Other Ambulatory Visit: Payer: Self-pay

## 2019-08-05 ENCOUNTER — Ambulatory Visit: Payer: Medicare PPO | Admitting: Physical Therapy

## 2019-08-05 DIAGNOSIS — R278 Other lack of coordination: Secondary | ICD-10-CM

## 2019-08-05 DIAGNOSIS — I639 Cerebral infarction, unspecified: Secondary | ICD-10-CM | POA: Diagnosis not present

## 2019-08-05 DIAGNOSIS — M6281 Muscle weakness (generalized): Secondary | ICD-10-CM

## 2019-08-05 DIAGNOSIS — I63512 Cerebral infarction due to unspecified occlusion or stenosis of left middle cerebral artery: Secondary | ICD-10-CM

## 2019-08-05 DIAGNOSIS — R2689 Other abnormalities of gait and mobility: Secondary | ICD-10-CM

## 2019-08-05 DIAGNOSIS — R262 Difficulty in walking, not elsewhere classified: Secondary | ICD-10-CM

## 2019-08-05 NOTE — Therapy (Signed)
[place] MA[place] [facility] SERVICES [address] [place], [place], [address] Phone: [phone]   Fax:  [phone]  Physical Therapy Treatment  Patient Details  Name: Courtney Chan MRN: [id number] Date of Birth: [birth year] Referring Provider (PT): Elbert Ewings   Encounter Date: 08/05/2019  PT End of Session - 08/05/19 1108    Visit Number  6    Number of Visits  17    Date for PT Re-Evaluation  09/14/19    PT Start Time  1100    PT Stop Time  1145    PT Time Calculation (min)  45 min       Past Medical History:  Diagnosis Date  . Atrial fibrillation ([place]) 2011  . Hyperlipidemia   . Hypertension   . Stroke ([place])   . Thyroid disease     Past Surgical History:  Procedure Laterality Date  . ABDOMINAL HYSTERECTOMY    . BREAST BIOPSY Left 1980   Negative  . CORONARY/GRAFT ACUTE MI REVASCULARIZATION N/A 06/28/2019   Procedure: Coronary/Graft Acute MI Revascularization;  Surgeon: Leonie Man, MD;  Location: [place] CV LAB;  Service: Cardiovascular;  Laterality: N/A;  . LEFT HEART CATH AND CORONARY ANGIOGRAPHY N/A 06/28/2019   Procedure: LEFT HEART CATH AND CORONARY ANGIOGRAPHY;  Surgeon: Leonie Man, MD;  Location: [place] CV LAB;  Service: Cardiovascular;  Laterality: N/A;    There were no vitals filed for this visit.  Subjective Assessment - 08/05/19 1108    Subjective  Patient is having no  chest pain today.    Pertinent History  Courtney Chan is a 86 y.o. right-handed female with history of atrial fibrillation followed by Dr.Paraschos at [facility], hyperlipidemia, hypertension. Patient lives with spouse independent prior to admission noted some recent cognitive decline.  Presented 06/28/2019 with chest pain at [facility].   She is found to have non-STEMI with PCI completed per cardiology services.  On 06/29/2019 after extubation altered mental status not following commands.  MRI showed small acute infarction.   Per report small acute infarct involving the posterior aspect of the left superior frontal gyrus.  She went to inpatient rehab for a week and now is back at her homoe.    How long can you sit comfortably?  unlimited    How long can you stand comfortably?  10    How long can you walk comfortably?  10    Patient Stated Goals  to walk better.    Currently in Pain?  No/denies    Pain Score  0-No pain    Pain Onset  More than a month ago       Treatment: Neuromuscular training: side stepping left and right in parallel bars 10 feet x 3 standing on blue foam with head turns x 1 min  Standing feet together on blue foam with trunk rotation x 1 min step ups from floor to 6 inch stool x 20 bilateral Step downs  from blue foam to 6 inch stool x 10 x 2  BLE Matrix fwd/bwd, side to side 22.5 lbs x 3 each direction  Leg press 25 lbs x 20 x 2   Pt educated throughout session about proper posture and technique with exercises. Improved exercise technique, movement at target joints, use of target muscles after min to mod verbal, visual, tactile cues. CGA and Min to mod verbal cues used throughout with increased in postural sway and LOB most seen with narrow base of support and  while on uneven surfaces. Continues to have balance deficits typical with diagnosis.                     PT Education - 08/05/19 1108    Education Details  HEP    Person(s) Educated  Patient    Methods  Explanation    Comprehension  Verbalized understanding       PT Short Term Goals - 07/20/19 1542      PT SHORT TERM GOAL #1   Title  Patient will be independent in home exercise program to improve strength/mobility for better functional independence with ADLs.    Time  4    Period  Weeks    Status  New    Target Date  08/17/19      PT SHORT TERM GOAL #2   Title  Patient (> 86 years old) will complete five times sit to stand test in < 15 seconds indicating an increased LE strength and improved balance.     Time  4    Period  Weeks    Status  New    Target Date  08/17/19        PT Long Term Goals - 07/20/19 1546      PT LONG TERM GOAL #1   Title  Patient will increase Berg Balance score by > 6 points to demonstrate decreased fall risk during functional activities.    Time  8    Period  Weeks    Status  New    Target Date  09/14/19      PT LONG TERM GOAL #2   Title  Patient will increase six minute walk test distance to >1000 for progression to community ambulator and improve gait ability    Time  8    Period  Weeks    Status  New    Target Date  09/14/19      PT LONG TERM GOAL #3   Title  Patient will increase 10 meter walk test to >1.20m/s as to improve gait speed for better community ambulation and to reduce fall risk.    Time  8    Period  Weeks    Status  New    Target Date  09/14/19      PT LONG TERM GOAL #4   Title  Patient will reduce timed up and go to <11 seconds to reduce fall risk and demonstrate improved transfer/gait ability.    Time  8    Period  Weeks    Status  New    Target Date  09/14/19            Plan - 08/05/19 1111    Clinical Impression Statement  Pt presents with unsteadiness on uneven surfaces and fatigues with therapeutic exercises.  Patient tolerated all interventions well this date and will benefit from continued skilled PT interventions to improve strength and balance and decrease risk of falling.   Personal Factors and Comorbidities  Age    Examination-Participation Restrictions  Cleaning;Driving    Stability/Clinical Decision Making  Stable/Uncomplicated    Rehab Potential  Good    PT Frequency  2x / week    PT Duration  8 weeks    PT Treatment/Interventions  Gait training;Therapeutic exercise;Balance training;Neuromuscular re-education;Therapeutic activities;Functional mobility training;Moist Heat;Patient/family education    PT Next Visit Plan  strengthening and balance training    PT Home Exercise Plan  RTB hip abd and ext in  standing, heel raises  Consulted and Agree with Plan of Care  Patient;Family member/caregiver       Patient will benefit from skilled therapeutic intervention in order to improve the following deficits and impairments:  Abnormal gait, Difficulty walking, Decreased strength, Decreased balance, Pain, Decreased activity tolerance  Visit Diagnosis: Muscle weakness (generalized)  Other lack of coordination  Left middle cerebral artery stroke (HCC)  Acute cerebral infarction (HCC)  Other abnormalities of gait and mobility  Difficulty in walking, not elsewhere classified     Problem List Patient Active Problem List   Diagnosis Date Noted  . Hypothyroidism   . Chronic kidney disease   . Supplemental oxygen dependent   . 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 Debility 07/04/2019  . CHF (congestive heart failure) (HCC)   . Atrial fibrillation (HCC)   . Acute cerebral infarction (HCC)   . Non-ST elevation (NSTEMI) myocardial infarction (HCC)   . Non-ST elevation MI (NSTEMI) (HCC) 06/28/2019  . Cardiac arrest due to underlying cardiac condition (HCC) 06/28/2019  . Cardiac arrest (HCC) 06/28/2019  . Ventilator dependent (HCC)   . PAD (peripheral artery disease) (HCC) 01/11/2019  . Acute cystitis without hematuria 12/31/2018  . Diarrhea 12/31/2018  . CKD (chronic kidney disease) stage 3, GFR 30-59 ml/min 05/27/2018  . History of TIA (transient ischemic attack) 05/27/2018  . Persistent atrial fibrillation (HCC) 08/25/2017  . Osteopenia of multiple sites 11/09/2016  . DNR (do not resuscitate) 10/31/2016  . Encounter for general adult medical examination without abnormal findings 05/02/2016  . Vaccine counseling 11/01/2015  . Borderline diabetes mellitus 11/03/2013  . Pure hypercholesterolemia 11/03/2013  . Atrial fibrillation with RVR (HCC) 09/14/2013  . Acquired hypothyroidism 09/14/2013  . Diverticulosis 09/14/2013  . Essential hypertension 09/14/2013  .  GERD without esophagitis 09/14/2013  . Mitral valve prolapse 09/14/2013  . H/O cardiac catheterization 08/12/2008    Ezekiel Ina, [place] DPT 08/05/2019, 11:12 AM  [place] [facility] MAIN [facility] SERVICES [address] [place], [place], [address] Phone: [phone]   Fax:  [phone]  Name: Courtney Chan MRN: [id number] Date of Birth: November 17, 1930

## 2019-08-05 NOTE — Therapy (Signed)
[facility] MAIN [facility] SERVICES [address] [place], [place], [address] Phone: [phone]   Fax:  [phone]  Occupational Therapy Treatment  Patient Details  Name: Courtney Chan MRN: [id number] Date of Birth: [birth year] No data recorded  Encounter Date: 08/05/2019  OT End of Session - 08/05/19 1020    Visit Number  5    Number of Visits  16    Date for OT Re-Evaluation  09/15/19    Authorization Type  [employer]    OT Start Time  1015    OT Stop Time  1100    OT Time Calculation (min)  45 min    Activity Tolerance  Patient tolerated treatment well    Behavior During Therapy  [facility] for tasks assessed/performed       Past Medical History:  Diagnosis Date  . Atrial fibrillation (HCC) 2011  . Hyperlipidemia   . Hypertension   . Stroke (HCC)   . Thyroid disease     Past Surgical History:  Procedure Laterality Date  . ABDOMINAL HYSTERECTOMY    . BREAST BIOPSY Left 1980   Negative  . CORONARY/GRAFT ACUTE MI REVASCULARIZATION N/A 06/28/2019   Procedure: Coronary/Graft Acute MI Revascularization;  Surgeon: Marykay Lex, MD;  Location: [facility] INVASIVE CV LAB;  Service: Cardiovascular;  Laterality: N/A;  . LEFT HEART CATH AND CORONARY ANGIOGRAPHY N/A 06/28/2019   Procedure: LEFT HEART CATH AND CORONARY ANGIOGRAPHY;  Surgeon: Marykay Lex, MD;  Location: [facility] INVASIVE CV LAB;  Service: Cardiovascular;  Laterality: N/A;    There were no vitals filed for this visit.  Subjective Assessment - 08/05/19 1019    Subjective   Pt. reports that her stomach is bothering her today    Patient is accompanied by:  Family member    Pertinent History  Patient was in [place] for 2 weeks with one of those weeks for rehab.No home health.  Came home a week ago last Sunday.    Patient Stated Goals  Patient wants to get stronger and back to normal, be more independent.    Currently in Pain?  No/denies      OT TREATMENT    Neuro muscular  re-education:  Pt. performed [facility] tasks using the Grooved pegboard. Pt. worked on grasping the grooved pegs from a horizontal position, and moving the pegs to a vertical position in the hand to prepare for placing them in the grooved slot. Pt. Worked on translatory movements of the hand, and using her hand for storage. Pt. Was able to store 5 items at a time. Pt. worked on Educational psychologist, and placing them onto a small dowel.  Therapeutic Exercise:  Pt. performed 1.5# dowel ex. For UE strengthening secondary to weakness. Chest press, and circular patterns were performed. 2# dumbbell ex. for elbow flexion and extension, forearm supination/pronation, and wrist flexion/extension. Pt. requires rest breaks and verbal cues for proper technique.  Pt. was upgraded to pink thearputty ex. For hand strengthening. Exercises included: gross gripping, lateral, and 3pt. Pinch strengthening, digit abduction, and thumb opposition.   Response to Treatment:  Pt. tolerated UE strengthening well, however required verbal, and frequent tactile cues. Pt. continues to require verbal cues, tactile cues, and cues for visual demonstration prior to, and during the exercises for proper movement patterns, and form. Pt. requires restbreaks. Pt. Dropped less items today.  Pt. requires cues for motor planning through the fine motor movements using the left hand. Pt. Continues  to work on  improving LUE, coordination, and functioning in order to be able to increase, and improve engagement of the LUE during ADLs, and IADL tasks.                        OT Education - 08/05/19 1020    Education Details  Ther. Ex    Person(s) Educated  Patient    Methods  Explanation;Demonstration    Comprehension  Verbalized understanding;Returned demonstration          OT Long Term Goals - 07/21/19 1000      OT LONG TERM GOAL #1   Title  Patient will be independent with home exercise program for  strengthening and coordination.    Baseline  no current program    Time  8    Period  Weeks    Status  New    Target Date  09/15/19      OT LONG TERM GOAL #2   Title  Patient will complete upper body and lower body bathing with modified independence.    Baseline  min assist at eval    Time  8    Period  Weeks    Status  New    Target Date  09/15/19      OT LONG TERM GOAL #3   Title  Patient to perform UB and LB dressing skills with modified independence.    Baseline  moderate assist at evaluation    Time  8    Period  Weeks    Status  New    Target Date  09/15/19      OT LONG TERM GOAL #4   Title  Patient will complete bed making with modified independence.    Baseline  mod assist at eval    Time  4    Period  Weeks    Status  New    Target Date  08/11/19      OT LONG TERM GOAL #5   Title  Patient will complete light meal preparation with modified independence    Baseline  unable at evaluation    Time  8    Period  Weeks    Status  New    Target Date  09/15/19      Long Term Additional Goals   Additional Long Term Goals  Yes      OT LONG TERM GOAL #6   Title  Patient will increase strength in LUE by 1 mm grade to perform homemaking tasks with modified independence.    Baseline  decreased strength in LUE at eval    Time  8    Period  Weeks    Status  New    Target Date  09/15/19            Plan - 08/05/19 1022    Clinical Impression Statement  Pt. tolerated UE strengthening well, however required verbal, and frequent tactile cues. Pt. continues to require verbal cues, tactile cues, and cues for visual demonstration prior to, and during the exercises for proper movement patterns, and form. Pt. requires restbreaks. Pt. Dropped less items today.  Pt. requires cues for motor planning through the fine motor movements using the left hand. Pt. Continues  to work on improving LUE, coordination, and functioning in order to be able to increase, and improve engagement of  the LUE during ADLs, and IADL tasks.   OT Occupational Profile and History  Detailed Assessment- Review of Records and additional review  of physical, cognitive, psychosocial history related to current functional performance    Occupational performance deficits (Please refer to evaluation for details):  ADL's;IADL's;Leisure    Body Structure / Function / Physical Skills  ADL;Coordination;Endurance;GMC;UE functional use;Balance;Decreased knowledge of use of DME;Flexibility;IADL;Pain;Dexterity;FMC;Strength;Proprioception;Mobility;ROM    Rehab Potential  Good    Clinical Decision Making  Limited treatment options, no task modification necessary    Comorbidities Affecting Occupational Performance:  May have comorbidities impacting occupational performance    Modification or Assistance to Complete Evaluation   No modification of tasks or assist necessary to complete eval    OT Frequency  2x / week    OT Duration  8 weeks    OT Treatment/Interventions  Self-care/ADL training;Cryotherapy;Therapeutic exercise;DME and/or AE instruction;Functional Mobility Training;Balance training;Neuromuscular education;Manual Therapy;Moist Heat;Contrast Bath;Energy conservation;Passive range of motion;Therapeutic activities;Patient/family education    Family Member Consulted  daughter       Patient will benefit from skilled therapeutic intervention in order to improve the following deficits and impairments:   Body Structure / Function / Physical Skills: ADL, Coordination, Endurance, GMC, UE functional use, Balance, Decreased knowledge of use of DME, Flexibility, IADL, Pain, Dexterity, FMC, Strength, Proprioception, Mobility, ROM       Visit Diagnosis: Muscle weakness (generalized)  Other lack of coordination    Problem List Patient Active Problem List   Diagnosis Date Noted  . Hypothyroidism   . Chronic kidney disease   . Supplemental oxygen dependent   . 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 Debility 07/04/2019  . CHF (congestive heart failure) (HCC)   . Atrial fibrillation (HCC)   . Acute cerebral infarction (HCC)   . Non-ST elevation (NSTEMI) myocardial infarction (HCC)   . Non-ST elevation MI (NSTEMI) (HCC) 06/28/2019  . Cardiac arrest due to underlying cardiac condition (HCC) 06/28/2019  . Cardiac arrest (HCC) 06/28/2019  . Ventilator dependent (HCC)   . PAD (peripheral artery disease) (HCC) 01/11/2019  . Acute cystitis without hematuria 12/31/2018  . Diarrhea 12/31/2018  . CKD (chronic kidney disease) stage 3, GFR 30-59 ml/min 05/27/2018  . History of TIA (transient ischemic attack) 05/27/2018  . Persistent atrial fibrillation (HCC) 08/25/2017  . Osteopenia of multiple sites 11/09/2016  . DNR (do not resuscitate) 10/31/2016  . Encounter for general adult medical examination without abnormal findings 05/02/2016  . Vaccine counseling 11/01/2015  . Borderline diabetes mellitus 11/03/2013  . Pure hypercholesterolemia 11/03/2013  . Atrial fibrillation with RVR (HCC) 09/14/2013  . Acquired hypothyroidism 09/14/2013  . Diverticulosis 09/14/2013  . Essential hypertension 09/14/2013  . GERD without esophagitis 09/14/2013  . Mitral valve prolapse 09/14/2013  . H/O cardiac catheterization 08/12/2008    Olegario Messier, MS, OTR/L 08/05/2019, 10:25 AM  [facility] MAIN [facility] SERVICES [address] [place], [place], [address] Phone: [phone]   Fax:  [phone]  Name: Courtney Chan MRN: [id number] Date of Birth: 1931-03-18

## 2019-08-10 ENCOUNTER — Ambulatory Visit: Payer: Medicare PPO | Admitting: Physical Therapy

## 2019-08-10 ENCOUNTER — Other Ambulatory Visit: Payer: Self-pay

## 2019-08-10 ENCOUNTER — Encounter: Payer: Self-pay | Admitting: Physical Therapy

## 2019-08-10 DIAGNOSIS — M6281 Muscle weakness (generalized): Secondary | ICD-10-CM

## 2019-08-10 DIAGNOSIS — I639 Cerebral infarction, unspecified: Secondary | ICD-10-CM | POA: Diagnosis not present

## 2019-08-10 DIAGNOSIS — I63512 Cerebral infarction due to unspecified occlusion or stenosis of left middle cerebral artery: Secondary | ICD-10-CM

## 2019-08-10 DIAGNOSIS — R278 Other lack of coordination: Secondary | ICD-10-CM

## 2019-08-10 DIAGNOSIS — R2689 Other abnormalities of gait and mobility: Secondary | ICD-10-CM

## 2019-08-10 DIAGNOSIS — R262 Difficulty in walking, not elsewhere classified: Secondary | ICD-10-CM

## 2019-08-10 NOTE — Therapy (Signed)
[place] MAIN [facility] SERVICES [address] [place], [place], [address] Phone: [phone]   Fax:  [phone]  Physical Therapy Treatment  Patient Details  Name: Courtney Chan MRN: [id number] Date of Birth: 1930-08-19 Referring Provider (PT): Elbert Ewings   Encounter Date: 08/10/2019  PT End of Session - 08/10/19 1020    Visit Number  7    Number of Visits  17    Date for PT Re-Evaluation  09/14/19    PT Start Time  4315    PT Stop Time  1100    PT Time Calculation (min)  45 min    Equipment Utilized During Treatment  Gait belt    Activity Tolerance  Patient tolerated treatment well    Behavior During Therapy  [facility] for tasks assessed/performed       Past Medical History:  Diagnosis Date  . Atrial fibrillation ([place]) 2011  . Hyperlipidemia   . Hypertension   . Stroke ([place])   . Thyroid disease     Past Surgical History:  Procedure Laterality Date  . ABDOMINAL HYSTERECTOMY    . BREAST BIOPSY Left 1980   Negative  . CORONARY/GRAFT ACUTE MI REVASCULARIZATION N/A 06/28/2019   Procedure: Coronary/Graft Acute MI Revascularization;  Surgeon: Leonie Man, MD;  Location: [facility] CV LAB;  Service: Cardiovascular;  Laterality: N/A;  . LEFT HEART CATH AND CORONARY ANGIOGRAPHY N/A 06/28/2019   Procedure: LEFT HEART CATH AND CORONARY ANGIOGRAPHY;  Surgeon: Leonie Man, MD;  Location: [place] CV LAB;  Service: Cardiovascular;  Laterality: N/A;    There were no vitals filed for this visit.  Subjective Assessment - 08/10/19 1019    Subjective  Patient is having no  chest pain today.    Pertinent History  Courtney Chan is a 86 y.o. right-handed female with history of atrial fibrillation followed by Dr.Paraschos at [facility], hyperlipidemia, hypertension. Patient lives with spouse independent prior to admission noted some recent cognitive decline.  Presented 06/28/2019 with chest pain at [facility].   She is found to  have non-STEMI with PCI completed per cardiology services.  On 06/29/2019 after extubation altered mental status not following commands.  MRI showed small acute infarction.  Per report small acute infarct involving the posterior aspect of the left superior frontal gyrus.  She went to inpatient rehab for a week and now is back at her homoe.    How long can you sit comfortably?  unlimited    How long can you stand comfortably?  10    How long can you walk comfortably?  10    Patient Stated Goals  to walk better.    Currently in Pain?  No/denies    Pain Score  0-No pain    Pain Onset  More than a month ago       Ther-ex  Octane fitness x 5 mins  Hip flexion marches with 2# ankle weights x 10 bilateral; Hip abduction with 2# x 10 bilateral; HS curls with 2# AW x 10 bilateral; Hip extension with 2# x 10 bilateral; Gait with head turns horizontal and vertical with various gait speeds x 500 feet Gait with fast pace and stop, gait with fast then slow pace, then stop and pivot x 100 feet intervals x 5   Patient needs occasional verbal cueing to improve posture and cueing to correctly perform exercises slowly, holding at end of range to increase motor firing of desired muscle to encourage fatigue.  PT Education - 08/10/19 1019    Education Details  HEP    Person(s) Educated  Patient    Methods  Explanation    Comprehension  Verbalized understanding       PT Short Term Goals - 07/20/19 1542      PT SHORT TERM GOAL #1   Title  Patient will be independent in home exercise program to improve strength/mobility for better functional independence with ADLs.    Time  4    Period  Weeks    Status  New    Target Date  08/17/19      PT SHORT TERM GOAL #2   Title  Patient (> 56 years old) will complete five times sit to stand test in < 15 seconds indicating an increased LE strength and improved balance.    Time  4    Period  Weeks    Status  New     Target Date  08/17/19        PT Long Term Goals - 07/20/19 1546      PT LONG TERM GOAL #1   Title  Patient will increase Berg Balance score by > 6 points to demonstrate decreased fall risk during functional activities.    Time  8    Period  Weeks    Status  New    Target Date  09/14/19      PT LONG TERM GOAL #2   Title  Patient will increase six minute walk test distance to >1000 for progression to community ambulator and improve gait ability    Time  8    Period  Weeks    Status  New    Target Date  09/14/19      PT LONG TERM GOAL #3   Title  Patient will increase 10 meter walk test to >1.42m/s as to improve gait speed for better community ambulation and to reduce fall risk.    Time  8    Period  Weeks    Status  New    Target Date  09/14/19      PT LONG TERM GOAL #4   Title  Patient will reduce timed up and go to <11 seconds to reduce fall risk and demonstrate improved transfer/gait ability.    Time  8    Period  Weeks    Status  New    Target Date  09/14/19            Plan - 08/10/19 1020    Clinical Impression Statement  Patient instructed in advanced LE strengthening and intermediate balance exercise. Patient required min VCS to improve weight shift and to increase terminal knee extension for better stance control. Patient would benefit from additional skilled PT intervention to improve strength, balance and gait safety.   Personal Factors and Comorbidities  Age    Examination-Participation Restrictions  Cleaning;Driving    Stability/Clinical Decision Making  Stable/Uncomplicated    Rehab Potential  Good    PT Frequency  2x / week    PT Duration  8 weeks    PT Treatment/Interventions  Gait training;Therapeutic exercise;Balance training;Neuromuscular re-education;Therapeutic activities;Functional mobility training;Moist Heat;Patient/family education    PT Next Visit Plan  strengthening and balance training    PT Home Exercise Plan  RTB hip abd and ext in  standing, heel raises    Consulted and Agree with Plan of Care  Patient;Family member/caregiver       Patient will benefit from skilled therapeutic intervention in  order to improve the following deficits and impairments:  Abnormal gait, Difficulty walking, Decreased strength, Decreased balance, Pain, Decreased activity tolerance  Visit Diagnosis: Muscle weakness (generalized)  Other lack of coordination  Left middle cerebral artery stroke (HCC)  Acute cerebral infarction (HCC)  Other abnormalities of gait and mobility  Difficulty in walking, not elsewhere classified     Problem List Patient Active Problem List   Diagnosis Date Noted  . Hypothyroidism   . Chronic kidney disease   . Supplemental oxygen dependent   . 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 Debility 07/04/2019  . CHF (congestive heart failure) (HCC)   . Atrial fibrillation (HCC)   . Acute cerebral infarction (HCC)   . Non-ST elevation (NSTEMI) myocardial infarction (HCC)   . Non-ST elevation MI (NSTEMI) (HCC) 06/28/2019  . Cardiac arrest due to underlying cardiac condition (HCC) 06/28/2019  . Cardiac arrest (HCC) 06/28/2019  . Ventilator dependent (HCC)   . PAD (peripheral artery disease) (HCC) 01/11/2019  . Acute cystitis without hematuria 12/31/2018  . Diarrhea 12/31/2018  . CKD (chronic kidney disease) stage 3, GFR 30-59 ml/min 05/27/2018  . History of TIA (transient ischemic attack) 05/27/2018  . Persistent atrial fibrillation (HCC) 08/25/2017  . Osteopenia of multiple sites 11/09/2016  . DNR (do not resuscitate) 10/31/2016  . Encounter for general adult medical examination without abnormal findings 05/02/2016  . Vaccine counseling 11/01/2015  . Borderline diabetes mellitus 11/03/2013  . Pure hypercholesterolemia 11/03/2013  . Atrial fibrillation with RVR (HCC) 09/14/2013  . Acquired hypothyroidism 09/14/2013  . Diverticulosis 09/14/2013  . Essential hypertension 09/14/2013  .  GERD without esophagitis 09/14/2013  . Mitral valve prolapse 09/14/2013  . H/O cardiac catheterization 08/12/2008    Ezekiel Ina , [place] DPT 08/10/2019, 10:21 AM  [place] [facility] MAIN [facility] SERVICES [address] [place], [place], [address] Phone: [phone]   Fax:  [phone]  Name: KINDRA BICKHAM MRN: [id number] Date of Birth: Sep 06, 1930

## 2019-08-12 ENCOUNTER — Encounter: Payer: Self-pay | Admitting: Physical Therapy

## 2019-08-12 ENCOUNTER — Other Ambulatory Visit: Payer: Self-pay

## 2019-08-12 ENCOUNTER — Encounter: Payer: Self-pay | Admitting: Occupational Therapy

## 2019-08-12 ENCOUNTER — Ambulatory Visit: Payer: Medicare PPO | Admitting: Occupational Therapy

## 2019-08-12 ENCOUNTER — Ambulatory Visit: Payer: Medicare PPO | Admitting: Physical Therapy

## 2019-08-12 DIAGNOSIS — R262 Difficulty in walking, not elsewhere classified: Secondary | ICD-10-CM

## 2019-08-12 DIAGNOSIS — R278 Other lack of coordination: Secondary | ICD-10-CM

## 2019-08-12 DIAGNOSIS — I63512 Cerebral infarction due to unspecified occlusion or stenosis of left middle cerebral artery: Secondary | ICD-10-CM

## 2019-08-12 DIAGNOSIS — R2689 Other abnormalities of gait and mobility: Secondary | ICD-10-CM

## 2019-08-12 DIAGNOSIS — I639 Cerebral infarction, unspecified: Secondary | ICD-10-CM

## 2019-08-12 DIAGNOSIS — M6281 Muscle weakness (generalized): Secondary | ICD-10-CM

## 2019-08-12 NOTE — Therapy (Signed)
[facility] MAIN [facility] SERVICES [address] [place], [place], [address] Phone: [phone]   Fax:  [phone]  Physical Therapy Treatment  Patient Details  Name: Courtney Chan MRN: [id number] Date of Birth: 10-Mar-1931 Referring Provider (PT): Roque Lias   Encounter Date: 08/12/2019  PT End of Session - 08/12/19 1007    Visit Number  8    Number of Visits  17    Date for PT Re-Evaluation  09/14/19    PT Start Time  1006    PT Stop Time  1050    PT Time Calculation (min)  44 min    Equipment Utilized During Treatment  Gait belt    Activity Tolerance  Patient tolerated treatment well    Behavior During Therapy  [facility] for tasks assessed/performed       Past Medical History:  Diagnosis Date  . Atrial fibrillation (HCC) 2011  . Hyperlipidemia   . Hypertension   . Stroke (HCC)   . Thyroid disease     Past Surgical History:  Procedure Laterality Date  . ABDOMINAL HYSTERECTOMY    . BREAST BIOPSY Left 1980   Negative  . CORONARY/GRAFT ACUTE MI REVASCULARIZATION N/A 06/28/2019   Procedure: Coronary/Graft Acute MI Revascularization;  Surgeon: Marykay Lex, MD;  Location: [facility] INVASIVE CV LAB;  Service: Cardiovascular;  Laterality: N/A;  . LEFT HEART CATH AND CORONARY ANGIOGRAPHY N/A 06/28/2019   Procedure: LEFT HEART CATH AND CORONARY ANGIOGRAPHY;  Surgeon: Marykay Lex, MD;  Location: [facility] INVASIVE CV LAB;  Service: Cardiovascular;  Laterality: N/A;    There were no vitals filed for this visit.  Subjective Assessment - 08/12/19 1007    Subjective  Patient is having no  chest pain today.    Pertinent History  Courtney Chan is a 86 y.o. right-handed female with history of atrial fibrillation followed by Dr.Paraschos at [facility], hyperlipidemia, hypertension. Patient lives with spouse independent prior to admission noted some recent cognitive decline.  Presented 06/28/2019 with chest pain at [facility].   She is found to  have non-STEMI with PCI completed per cardiology services.  On 06/29/2019 after extubation altered mental status not following commands.  MRI showed small acute infarction.  Per report small acute infarct involving the posterior aspect of the left superior frontal gyrus.  She went to inpatient rehab for a week and now is back at her homoe.    How long can you sit comfortably?  unlimited    How long can you stand comfortably?  10    How long can you walk comfortably?  10    Patient Stated Goals  to walk better.    Currently in Pain?  No/denies    Pain Score  0-No pain    Pain Onset  More than a month ago       Treatment: Treatment: Neuromuscular Training: Stool: staggered stance, head turns side/side, up/down x 5 reps each, each foot in front; VCs for proper technique and positioning for each exercise staggered stance, trunk rotation with 2 lb rod, VC to keep UE straight and turn head with trunk  Hurdle: Forward and backward stepping over hurdle x15 each direction, VCs to take big enough steps and to try to increase speed to work on coordination  Side stepping over hurdle 15x each direction  Blue Foam: Side stepping x10 on blue balance CGA for safety, VCs for taking a big enough step  Airex pad trunk rotation x2 min, CGA for safety,  demonstrated difficulty with keeping arms extended and full rotation with head turn Airex pad, balloon tapping to mirror x2 min, supervision for safety with varying directions and speed of balloon, VCs for utilizing both hands and minimizing UE support  TM . 8 miles / hour x 3 mins, 1 min    Matrix: Fwd/bwd gait with 22. 5 lbs and CGA, cues for posture and stepping strategies, occasional LOB Side stepping left and right and CGA with cues to slow movement    Pt educated throughout session about proper posture and technique with exercises. Improved exercise technique, movement at target joints, use of target muscles after min to mod verbal, visual, tactile  cues. CGA and Min to mod verbal cues used throughout with increased in postural sway and LOB most seen with narrow base of support and while on uneven surfaces.                       PT Education - 08/12/19 1007    Education Details  HEP    Person(s) Educated  Patient    Methods  Explanation    Comprehension  Verbalized understanding;Returned demonstration;Need further instruction       PT Short Term Goals - 07/20/19 1542      PT SHORT TERM GOAL #1   Title  Patient will be independent in home exercise program to improve strength/mobility for better functional independence with ADLs.    Time  4    Period  Weeks    Status  New    Target Date  08/17/19      PT SHORT TERM GOAL #2   Title  Patient (> 45 years old) will complete five times sit to stand test in < 15 seconds indicating an increased LE strength and improved balance.    Time  4    Period  Weeks    Status  New    Target Date  08/17/19        PT Long Term Goals - 07/20/19 1546      PT LONG TERM GOAL #1   Title  Patient will increase Berg Balance score by > 6 points to demonstrate decreased fall risk during functional activities.    Time  8    Period  Weeks    Status  New    Target Date  09/14/19      PT LONG TERM GOAL #2   Title  Patient will increase six minute walk test distance to >1000 for progression to community ambulator and improve gait ability    Time  8    Period  Weeks    Status  New    Target Date  09/14/19      PT LONG TERM GOAL #3   Title  Patient will increase 10 meter walk test to >1.34m/s as to improve gait speed for better community ambulation and to reduce fall risk.    Time  8    Period  Weeks    Status  New    Target Date  09/14/19      PT LONG TERM GOAL #4   Title  Patient will reduce timed up and go to <11 seconds to reduce fall risk and demonstrate improved transfer/gait ability.    Time  8    Period  Weeks    Status  New    Target Date  09/14/19             Plan - 08/12/19 1008  Clinical Impression Statement  Pt presents with unsteadiness on uneven surfaces and fatigues with therapeutic exercises. Patient needs assist with beginning moderate balance activities and needs CGA assist with standing activities. Patient demonstrates difficulty with dynamic standing balance and with narrow base of support and increased challenges for LE.  Patient tolerated all interventions well this date and skilled PT will continue to improve mobility and strength.     Personal Factors and Comorbidities  Age    Examination-Participation Restrictions  Cleaning;Driving    Stability/Clinical Decision Making  Stable/Uncomplicated    Rehab Potential  Good    PT Frequency  2x / week    PT Duration  8 weeks    PT Treatment/Interventions  Gait training;Therapeutic exercise;Balance training;Neuromuscular re-education;Therapeutic activities;Functional mobility training;Moist Heat;Patient/family education    PT Next Visit Plan  strengthening and balance training    PT Home Exercise Plan  RTB hip abd and ext in standing, heel raises    Consulted and Agree with Plan of Care  Patient;Family member/caregiver       Patient will benefit from skilled therapeutic intervention in order to improve the following deficits and impairments:  Abnormal gait, Difficulty walking, Decreased strength, Decreased balance, Pain, Decreased activity tolerance  Visit Diagnosis: Muscle weakness (generalized)  Other lack of coordination  Left middle cerebral artery stroke (HCC)  Acute cerebral infarction (HCC)  Other abnormalities of gait and mobility  Difficulty in walking, not elsewhere classified     Problem List Patient Active Problem List   Diagnosis Date Noted  . Hypothyroidism   . Chronic kidney disease   . Supplemental oxygen dependent   . 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 Debility 07/04/2019  . CHF (congestive heart failure) ([place])   .  Atrial fibrillation ([place])   . Acute cerebral infarction ([place])   . Non-ST elevation (NSTEMI) myocardial infarction ([place])   . Non-ST elevation MI (NSTEMI) ([place]) 06/28/2019  . Cardiac arrest due to underlying cardiac condition ([place]) 06/28/2019  . Cardiac arrest ([place]) 06/28/2019  . Ventilator dependent ([place])   . PAD (peripheral artery disease) ([place]) 01/11/2019  . Acute cystitis without hematuria 12/31/2018  . Diarrhea 12/31/2018  . CKD (chronic kidney disease) stage 3, GFR 30-59 ml/min 05/27/2018  . History of TIA (transient ischemic attack) 05/27/2018  . Persistent atrial fibrillation ([place]) 08/25/2017  . Osteopenia of multiple sites 11/09/2016  . DNR (do not resuscitate) 10/31/2016  . Encounter for general adult medical examination without abnormal findings 05/02/2016  . Vaccine counseling 11/01/2015  . Borderline diabetes mellitus 11/03/2013  . Pure hypercholesterolemia 11/03/2013  . Atrial fibrillation with RVR ([place]) 09/14/2013  . Acquired hypothyroidism 09/14/2013  . Diverticulosis 09/14/2013  . Essential hypertension 09/14/2013  . GERD without esophagitis 09/14/2013  . Mitral valve prolapse 09/14/2013  . H/O cardiac catheterization 08/12/2008    Alanson Puls, [place] DPT 08/12/2019, 10:49 AM  [place] MAIN [facility] SERVICES [place], [place], [address] Phone: [phone]   Fax:  [phone]  Name: ESTELITA ITEN MRN: [id number] Date of Birth: [birth year]

## 2019-08-12 NOTE — Therapy (Signed)
[facility] MAIN [facility] SERVICES [address] [place], [place], [address] Phone: [phone]   Fax:  [phone]  Occupational Therapy Treatment  Patient Details  Name: Courtney Chan MRN: [id number] Date of Birth: June 18, 1930 No data recorded  Encounter Date: 08/12/2019  OT End of Session - 08/12/19 0946    Visit Number  6    Number of Visits  16    Date for OT Re-Evaluation  09/15/19    Authorization Type  [employer]    OT Start Time  0915    OT Stop Time  1000    OT Time Calculation (min)  45 min    Activity Tolerance  Patient tolerated treatment well    Behavior During Therapy  [facility] for tasks assessed/performed       Past Medical History:  Diagnosis Date  . Atrial fibrillation (HCC) 2011  . Hyperlipidemia   . Hypertension   . Stroke (HCC)   . Thyroid disease     Past Surgical History:  Procedure Laterality Date  . ABDOMINAL HYSTERECTOMY    . BREAST BIOPSY Left 1980   Negative  . CORONARY/GRAFT ACUTE MI REVASCULARIZATION N/A 06/28/2019   Procedure: Coronary/Graft Acute MI Revascularization;  Surgeon: Marykay Lex, MD;  Location: [facility] INVASIVE CV LAB;  Service: Cardiovascular;  Laterality: N/A;  . LEFT HEART CATH AND CORONARY ANGIOGRAPHY N/A 06/28/2019   Procedure: LEFT HEART CATH AND CORONARY ANGIOGRAPHY;  Surgeon: Marykay Lex, MD;  Location: [facility] INVASIVE CV LAB;  Service: Cardiovascular;  Laterality: N/A;    There were no vitals filed for this visit.  Subjective Assessment - 08/12/19 0942    Subjective   Patient reports her rib pain is better and has no pain today.    Pertinent History  Patient was in [facility] for 2 weeks with one of those weeks for rehab.No home health.  Came home a week ago last Sunday.    Patient Stated Goals  Patient wants to get stronger and back to normal, be more independent.    Currently in Pain?  No/denies       Therapeutic Exercise: Patient seen for strengthening with UBE in sitting for 6  mins, forwards and backwards with level of resistance 2 to 2.5. Therapist in constant attendance to adjust settings and ensure grip.  2# dowel for UB strengthening for shoulder flexion, ABD, ADD, chest press, forwards and backwards circles. 2 sets of 10 reps each, cues for form and technique.   Manipulation of small and medium washers to reach and place onto hooks in elevation with left UE.  Manipulation of small 1/2 inch pegs to pick up and place into grid with cues for prehension patterns.  Patient working towards using translatory skills of the hand and using the hand for storage.  Occasional cues for prehension patterns.   Response to tx: Patient has continued to make good progress in all areas, strength has improved and motor planning has improved with tasks.  Able to increase dowel weight this date, decreased cues and no pain in rib area during exercises.  Continue OT to maximize safety and independence in necessary daily tasks.                     OT Education - 08/12/19 0946    Education Details  Ther. Ex    Person(s) Educated  Patient    Methods  Explanation;Demonstration    Comprehension  Verbalized understanding;Returned demonstration  OT Long Term Goals - 07/21/19 1000      OT LONG TERM GOAL #1   Title  Patient will be independent with home exercise program for strengthening and coordination.    Baseline  no current program    Time  8    Period  Weeks    Status  New    Target Date  09/15/19      OT LONG TERM GOAL #2   Title  Patient will complete upper body and lower body bathing with modified independence.    Baseline  min assist at eval    Time  8    Period  Weeks    Status  New    Target Date  09/15/19      OT LONG TERM GOAL #3   Title  Patient to perform UB and LB dressing skills with modified independence.    Baseline  moderate assist at evaluation    Time  8    Period  Weeks    Status  New    Target Date  09/15/19      OT LONG  TERM GOAL #4   Title  Patient will complete bed making with modified independence.    Baseline  mod assist at eval    Time  4    Period  Weeks    Status  New    Target Date  08/11/19      OT LONG TERM GOAL #5   Title  Patient will complete light meal preparation with modified independence    Baseline  unable at evaluation    Time  8    Period  Weeks    Status  New    Target Date  09/15/19      Long Term Additional Goals   Additional Long Term Goals  Yes      OT LONG TERM GOAL #6   Title  Patient will increase strength in LUE by 1 mm grade to perform homemaking tasks with modified independence.    Baseline  decreased strength in LUE at eval    Time  8    Period  Weeks    Status  New    Target Date  09/15/19            Plan - 08/12/19 0946    Clinical Impression Statement  Patient has continued to make good progress in all areas, strength has improved and motor planning has improved with tasks.  Able to increase dowel weight this date, decreased cues and no pain in rib area during exercises.  Continue OT to maximize safety and independence in necessary daily tasks.    OT Occupational Profile and History  Detailed Assessment- Review of Records and additional review of physical, cognitive, psychosocial history related to current functional performance    Occupational performance deficits (Please refer to evaluation for details):  ADL's;IADL's;Leisure    Body Structure / Function / Physical Skills  ADL;Coordination;Endurance;GMC;UE functional use;Balance;Decreased knowledge of use of DME;Flexibility;IADL;Pain;Dexterity;FMC;Strength;Proprioception;Mobility;ROM    Psychosocial Skills  Environmental  Adaptations;Routines and Behaviors;Habits    Rehab Potential  Good    Clinical Decision Making  Limited treatment options, no task modification necessary    Comorbidities Affecting Occupational Performance:  May have comorbidities impacting occupational performance    Modification or  Assistance to Complete Evaluation   No modification of tasks or assist necessary to complete eval    OT Frequency  2x / week    OT Duration  8 weeks  OT Treatment/Interventions  Self-care/ADL training;Cryotherapy;Therapeutic exercise;DME and/or AE instruction;Functional Mobility Training;Balance training;Neuromuscular education;Manual Therapy;Moist Heat;Contrast Bath;Energy conservation;Passive range of motion;Therapeutic activities;Patient/family education    Consulted and Agree with Plan of Care  Patient       Patient will benefit from skilled therapeutic intervention in order to improve the following deficits and impairments:   Body Structure / Function / Physical Skills: ADL, Coordination, Endurance, GMC, UE functional use, Balance, Decreased knowledge of use of DME, Flexibility, IADL, Pain, Dexterity, FMC, Strength, Proprioception, Mobility, ROM   Psychosocial Skills: Environmental  Adaptations, Routines and Behaviors, Habits   Visit Diagnosis: Muscle weakness (generalized)  Other lack of coordination  Left middle cerebral artery stroke [facility])    Problem List Patient Active Problem List   Diagnosis Date Noted  . Hypothyroidism   . Chronic kidney disease   . Supplemental oxygen dependent   . 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 Debility 07/04/2019  . CHF (congestive heart failure) (HCC)   . Atrial fibrillation (HCC)   . Acute cerebral infarction (HCC)   . Non-ST elevation (NSTEMI) myocardial infarction (HCC)   . Non-ST elevation MI (NSTEMI) (HCC) 06/28/2019  . Cardiac arrest due to underlying cardiac condition (HCC) 06/28/2019  . Cardiac arrest (HCC) 06/28/2019  . Ventilator dependent (HCC)   . PAD (peripheral artery disease) (HCC) 01/11/2019  . Acute cystitis without hematuria 12/31/2018  . Diarrhea 12/31/2018  . CKD (chronic kidney disease) stage 3, GFR 30-59 ml/min 05/27/2018  . History of TIA (transient ischemic attack) 05/27/2018  . Persistent  atrial fibrillation (HCC) 08/25/2017  . Osteopenia of multiple sites 11/09/2016  . DNR (do not resuscitate) 10/31/2016  . Encounter for general adult medical examination without abnormal findings 05/02/2016  . Vaccine counseling 11/01/2015  . Borderline diabetes mellitus 11/03/2013  . Pure hypercholesterolemia 11/03/2013  . Atrial fibrillation with RVR (HCC) 09/14/2013  . Acquired hypothyroidism 09/14/2013  . Diverticulosis 09/14/2013  . Essential hypertension 09/14/2013  . GERD without esophagitis 09/14/2013  . Mitral valve prolapse 09/14/2013  . H/O cardiac catheterization 08/12/2008   Kerrie Chan, OTR/L, CLT  Courtney Chan 08/12/2019, 1:24 PM  [facility] MAIN [facility] SERVICES [address] [place], [place], [address] Phone: [phone]   Fax:  [phone]  Name: Courtney Chan MRN: [id number] Date of Birth: Feb 02, 1931

## 2019-08-18 ENCOUNTER — Ambulatory Visit: Payer: Medicare PPO | Admitting: Physical Therapy

## 2019-08-18 ENCOUNTER — Other Ambulatory Visit: Payer: Self-pay

## 2019-08-18 ENCOUNTER — Encounter: Payer: Self-pay | Admitting: Occupational Therapy

## 2019-08-18 ENCOUNTER — Ambulatory Visit: Payer: Medicare PPO | Attending: Physician Assistant | Admitting: Occupational Therapy

## 2019-08-18 ENCOUNTER — Encounter: Payer: Self-pay | Admitting: Physical Therapy

## 2019-08-18 DIAGNOSIS — R278 Other lack of coordination: Secondary | ICD-10-CM | POA: Diagnosis present

## 2019-08-18 DIAGNOSIS — M6281 Muscle weakness (generalized): Secondary | ICD-10-CM | POA: Insufficient documentation

## 2019-08-18 DIAGNOSIS — I639 Cerebral infarction, unspecified: Secondary | ICD-10-CM | POA: Insufficient documentation

## 2019-08-18 DIAGNOSIS — R262 Difficulty in walking, not elsewhere classified: Secondary | ICD-10-CM | POA: Diagnosis present

## 2019-08-18 DIAGNOSIS — I63512 Cerebral infarction due to unspecified occlusion or stenosis of left middle cerebral artery: Secondary | ICD-10-CM | POA: Insufficient documentation

## 2019-08-18 DIAGNOSIS — R2689 Other abnormalities of gait and mobility: Secondary | ICD-10-CM

## 2019-08-18 NOTE — Patient Instructions (Signed)
OUTCOME MEASURES: TEST Outcome 07/20/19  Interpretation  5 times sit<>stand 22.36ec 14.78 sec without arms >84 yo, >15 sec indicates increased risk for falls  10 meter walk test       .81          m/s  1.2 m/s <1.0 m/s indicates increased risk for falls; limited community ambulator  Timed up and Go   18 sec                9.14 sec without assistive device <14 sec indicates increased risk for falls  6 minute walk test    700       Feet  1175 feet 1000 feet is community Financial controller NT due to time  49/56 <36/56 (100% risk for falls), 37-45 (80% risk for falls); 46-51 (>50% risk for falls); 52-55 (lower risk <25% of falls)

## 2019-08-18 NOTE — Therapy (Signed)
[facility] MAIN [facility] SERVICES [address] [place], [place], [address] Phone: [phone]   Fax:  [phone]  Occupational Therapy Treatment  Patient Details  Name: Courtney Chan MRN: [id number] Date of Birth: July 15, 1930 No data recorded  Encounter Date: 08/18/2019  OT End of Session - 08/18/19 1310    Visit Number  7    Number of Visits  16    Date for OT Re-Evaluation  09/15/19    Authorization Type  [employer]    OT Start Time  1300    OT Stop Time  1345    OT Time Calculation (min)  45 min    Activity Tolerance  Patient tolerated treatment well    Behavior During Therapy  [facility] for tasks assessed/performed       Past Medical History:  Diagnosis Date  . Atrial fibrillation (HCC) 2011  . Hyperlipidemia   . Hypertension   . Stroke (HCC)   . Thyroid disease     Past Surgical History:  Procedure Laterality Date  . ABDOMINAL HYSTERECTOMY    . BREAST BIOPSY Left 1980   Negative  . CORONARY/GRAFT ACUTE MI REVASCULARIZATION N/A 06/28/2019   Procedure: Coronary/Graft Acute MI Revascularization;  Surgeon: Marykay Lex, MD;  Location: [facility] INVASIVE CV LAB;  Service: Cardiovascular;  Laterality: N/A;  . LEFT HEART CATH AND CORONARY ANGIOGRAPHY N/A 06/28/2019   Procedure: LEFT HEART CATH AND CORONARY ANGIOGRAPHY;  Surgeon: Marykay Lex, MD;  Location: [facility] INVASIVE CV LAB;  Service: Cardiovascular;  Laterality: N/A;    There were no vitals filed for this visit.  Subjective Assessment - 08/18/19 1309    Subjective   Patient reports that her chest pain has improved.    Patient is accompanied by:  Family member    Pertinent History  Patient was in [place] for 2 weeks with one of those weeks for rehab.No home health.  Came home a week ago last Sunday.    Currently in Pain?  No/denies      OT TREATMENT   Neuro muscular re-education:  Pt. worked on [facility] skills grasping 1" sticks, 1/4" collars, and 1/4" washers. Pt. worked on  storing the objects in the palm, and translatory skills moving the items from the palm of the hand to the tip of the 2nd digit, and thumb. Pt. Utilized bilateral hand coordination skills. Pt. worked on removing the pegs using bilateral alternating hand patterns, however required increased cues.  Therapeutic Exercise:  Pt. performed2# dowel ex. For UE strengthening secondary to weakness.Chest press,andcircular patternswere performed.2# dumbbell ex. for elbow flexion and extension, forearm supination/pronation,andwrist flexion/extension.Pt. requires rest breaks and verbal cues for proper technique. Pt. performed left gross gripping with grip strengthener. Pt. worked on sustaining grip while grasping pegs and reaching at various heights. Pt. Worked with the grip strength set at Pt. Worked on pinch strengthening in the left hand for lateral, and 3pt. pinch using yellow, red, green, and blue resistive clips. Pt. worked on placing the clips at various vertical and horizontal angles. Tactile and verbal cues were required for eliciting the desired movement. Pt. worked on the gross gripping with the [employer] dynamometer Left; 30.8#, 33.6#, 32#, 30.6# Right: 30.4#, 31.8#, 27.6#, 25.2#  Response to Treatment:  Pt. reports that she has a follow-up MD appointment on Friday of this week. Pt. Is making progress, and reports that it  Is easier to pick up more objects at home. Pt. tolerated UE strengthening well, however required verbal, and  frequent tactile cues. Pt. continues to require verbal cues, tactile cues, and cues for visual demonstration prior to, and during the exercises for proper movement patterns, and form. Pt. requires restbreaks.Pt. continues to require cues for motor planning through the fine motor movements using the left hand with emphasis placed on performing the tasks slowly while focusing on movement patterns. Pt. Continues to work on improving LUE, coordination, and functioning  in order to be able to increase, and improve engagement of the LUE during ADLs, and IADL tasks.                       OT Education - 08/18/19 1310    Education Details  Ther. Ex    Person(s) Educated  Patient    Methods  Explanation;Demonstration    Comprehension  Verbalized understanding;Returned demonstration          OT Long Term Goals - 07/21/19 1000      OT LONG TERM GOAL #1   Title  Patient will be independent with home exercise program for strengthening and coordination.    Baseline  no current program    Time  8    Period  Weeks    Status  New    Target Date  09/15/19      OT LONG TERM GOAL #2   Title  Patient will complete upper body and lower body bathing with modified independence.    Baseline  min assist at eval    Time  8    Period  Weeks    Status  New    Target Date  09/15/19      OT LONG TERM GOAL #3   Title  Patient to perform UB and LB dressing skills with modified independence.    Baseline  moderate assist at evaluation    Time  8    Period  Weeks    Status  New    Target Date  09/15/19      OT LONG TERM GOAL #4   Title  Patient will complete bed making with modified independence.    Baseline  mod assist at eval    Time  4    Period  Weeks    Status  New    Target Date  08/11/19      OT LONG TERM GOAL #5   Title  Patient will complete light meal preparation with modified independence    Baseline  unable at evaluation    Time  8    Period  Weeks    Status  New    Target Date  09/15/19      Long Term Additional Goals   Additional Long Term Goals  Yes      OT LONG TERM GOAL #6   Title  Patient will increase strength in LUE by 1 mm grade to perform homemaking tasks with modified independence.    Baseline  decreased strength in LUE at eval    Time  8    Period  Weeks    Status  New    Target Date  09/15/19            Plan - 08/18/19 1311    Clinical Impression Statement Pt. reports that she has a follow-up  MD appointment on Friday of this week. Pt. Is making progress, and reports that it  Is easier to pick up more objects at home. Pt. tolerated UE strengthening well, however required verbal, and frequent tactile  cues. Pt. continues to require verbal cues, tactile cues, and cues for visual demonstration prior to, and during the exercises for proper movement patterns, and form. Pt. requires restbreaks.Pt. continues to require cues for motor planning through the fine motor movements using the left hand with emphasis placed on performing the tasks slowly while focusing on movement patterns. Pt. Continues to work on improving LUE, coordination, and functioning in order to be able to increase, and improve engagement of the LUE during ADLs, and IADL tasks.   OT Occupational Profile and History  Detailed Assessment- Review of Records and additional review of physical, cognitive, psychosocial history related to current functional performance    Occupational performance deficits (Please refer to evaluation for details):  ADL's;IADL's;Leisure    Body Structure / Function / Physical Skills  ADL;Coordination;Endurance;GMC;UE functional use;Balance;Decreased knowledge of use of DME;Flexibility;IADL;Pain;Dexterity;FMC;Strength;Proprioception;Mobility;ROM    Psychosocial Skills  Environmental  Adaptations;Routines and Behaviors;Habits    Rehab Potential  Good    Clinical Decision Making  Limited treatment options, no task modification necessary    Comorbidities Affecting Occupational Performance:  May have comorbidities impacting occupational performance    Modification or Assistance to Complete Evaluation   No modification of tasks or assist necessary to complete eval    OT Frequency  2x / week    OT Duration  8 weeks    OT Treatment/Interventions  Self-care/ADL training;Cryotherapy;Therapeutic exercise;DME and/or AE instruction;Functional Mobility Training;Balance training;Neuromuscular education;Manual Therapy;Moist  Heat;Contrast Bath;Energy conservation;Passive range of motion;Therapeutic activities;Patient/family education    Consulted and Agree with Plan of Care  Patient    Family Member Consulted  daughter       Patient will benefit from skilled therapeutic intervention in order to improve the following deficits and impairments:   Body Structure / Function / Physical Skills: ADL, Coordination, Endurance, GMC, UE functional use, Balance, Decreased knowledge of use of DME, Flexibility, IADL, Pain, Dexterity, FMC, Strength, Proprioception, Mobility, ROM   Psychosocial Skills: Environmental  Adaptations, Routines and Behaviors, Habits   Visit Diagnosis: Muscle weakness (generalized)  Other lack of coordination    Problem List Patient Active Problem List   Diagnosis Date Noted  . Hypothyroidism   . Chronic kidney disease   . Supplemental oxygen dependent   . 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 Debility 07/04/2019  . CHF (congestive heart failure) (HCC)   . Atrial fibrillation (HCC)   . Acute cerebral infarction (HCC)   . Non-ST elevation (NSTEMI) myocardial infarction (HCC)   . Non-ST elevation MI (NSTEMI) (HCC) 06/28/2019  . Cardiac arrest due to underlying cardiac condition (HCC) 06/28/2019  . Cardiac arrest (HCC) 06/28/2019  . Ventilator dependent (HCC)   . PAD (peripheral artery disease) (HCC) 01/11/2019  . Acute cystitis without hematuria 12/31/2018  . Diarrhea 12/31/2018  . CKD (chronic kidney disease) stage 3, GFR 30-59 ml/min 05/27/2018  . History of TIA (transient ischemic attack) 05/27/2018  . Persistent atrial fibrillation (HCC) 08/25/2017  . Osteopenia of multiple sites 11/09/2016  . DNR (do not resuscitate) 10/31/2016  . Encounter for general adult medical examination without abnormal findings 05/02/2016  . Vaccine counseling 11/01/2015  . Borderline diabetes mellitus 11/03/2013  . Pure hypercholesterolemia 11/03/2013  . Atrial fibrillation with RVR  (HCC) 09/14/2013  . Acquired hypothyroidism 09/14/2013  . Diverticulosis 09/14/2013  . Essential hypertension 09/14/2013  . GERD without esophagitis 09/14/2013  . Mitral valve prolapse 09/14/2013  . H/O cardiac catheterization 08/12/2008    Olegario Messier, MS, OTR/L 08/18/2019, 1:15 PM  [facility]  [place] MAIN [facility] SERVICES [place], [place], [address] Phone: [phone]   Fax:  [phone]  Name: Courtney Chan MRN: [id number] Date of Birth: [birth year]

## 2019-08-18 NOTE — Therapy (Signed)
[place] MAIN [facility] SERVICES [address] [place], [place], [address] Phone: [phone]   Fax:  [phone]  Physical Therapy Treatment  Patient Details  Name: Courtney Chan MRN: [id number] Date of Birth: 02/03/1931 Referring Provider (PT): Elbert Ewings   Encounter Date: 08/18/2019  PT End of Session - 08/18/19 1348    Visit Number  9    Number of Visits  17    Date for PT Re-Evaluation  09/14/19    PT Start Time  3810    PT Stop Time  1430    PT Time Calculation (min)  43 min    Equipment Utilized During Treatment  Gait belt    Activity Tolerance  Patient tolerated treatment well    Behavior During Therapy  [facility] for tasks assessed/performed       Past Medical History:  Diagnosis Date  . Atrial fibrillation ([place]) 2011  . Hyperlipidemia   . Hypertension   . Stroke ([place])   . Thyroid disease     Past Surgical History:  Procedure Laterality Date  . ABDOMINAL HYSTERECTOMY    . BREAST BIOPSY Left 1980   Negative  . CORONARY/GRAFT ACUTE MI REVASCULARIZATION N/A 06/28/2019   Procedure: Coronary/Graft Acute MI Revascularization;  Surgeon: Leonie Man, MD;  Location: [place] CV LAB;  Service: Cardiovascular;  Laterality: N/A;  . LEFT HEART CATH AND CORONARY ANGIOGRAPHY N/A 06/28/2019   Procedure: LEFT HEART CATH AND CORONARY ANGIOGRAPHY;  Surgeon: Leonie Man, MD;  Location: [place] CV LAB;  Service: Cardiovascular;  Laterality: N/A;    There were no vitals filed for this visit.  Subjective Assessment - 08/18/19 1345    Subjective  Patient reports doing well. She reports walking every day for about 1/2 a mile. Denies any new falls or stumbles. Denies any chest pain currently;    Pertinent History  Courtney Chan is a 86 y.o. right-handed female with history of atrial fibrillation followed by Dr.Paraschos at [facility], hyperlipidemia, hypertension. Patient lives with spouse independent prior to  admission noted some recent cognitive decline.  Presented 06/28/2019 with chest pain at [facility].   She is found to have non-STEMI with PCI completed per cardiology services.  On 06/29/2019 after extubation altered mental status not following commands.  MRI showed small acute infarction.  Per report small acute infarct involving the posterior aspect of the left superior frontal gyrus.  She went to inpatient rehab for a week and now is back at her homoe.    How long can you sit comfortably?  unlimited    How long can you stand comfortably?  10    How long can you walk comfortably?  10    Patient Stated Goals  to walk better.    Currently in Pain?  No/denies    Pain Onset  More than a month ago    Multiple Pain Sites  No         [facility] PT Assessment - 08/18/19 0001      6 Minute Walk- Baseline   6 Minute Walk- Baseline  yes    BP (mmHg)  106/51    HR (bpm)  81    02 Sat (%RA)  100 %      6 Minute walk- Post Test   6 Minute Walk Post Test  yes    BP (mmHg)  117/72    HR (bpm)  109    02 Sat (%RA)  100 %  6 minute walk test results    Aerobic Endurance Distance Walked  1175    Endurance additional comments  no assistive device      Standardized Balance Assessment   Standardized Balance Assessment  Berg Balance Test      Berg Balance Test   Sit to Stand  Able to stand without using hands and stabilize independently    Standing Unsupported  Able to stand safely 2 minutes    Sitting with Back Unsupported but Feet Supported on Floor or Stool  Able to sit safely and securely 2 minutes    Stand to Sit  Sits safely with minimal use of hands    Transfers  Able to transfer safely, minor use of hands    Standing Unsupported with Eyes Closed  Able to stand 10 seconds safely    Standing Unsupported with Feet Together  Able to place feet together independently and stand 1 minute safely    From Standing, Reach Forward with Outstretched Arm  Can reach forward >12 cm safely (5")    From Standing  Position, Pick up Object from Floor  Able to pick up shoe safely and easily    From Standing Position, Turn to Look Behind Over each Shoulder  Looks behind from both sides and weight shifts well    Turn 360 Degrees  Able to turn 360 degrees safely but slowly    Standing Unsupported, Alternately Place Feet on Step/Stool  Able to stand independently and safely and complete 8 steps in 20 seconds    Standing Unsupported, One Foot in Front  Able to plae foot ahead of the other independently and hold 30 seconds    Standing on One Leg  Tries to lift leg/unable to hold 3 seconds but remains standing independently    Total Score  49        Treatment: Neuromuscular Training: Standing on airex foam: -BLE heel raises x10 reps unsupported -standing one foot on airex, one foot on 4 inch step:  Arms across chest trunk rotation x5 reps each foot on step;  BUE ball pass side/side x5 reps each foot on step; Tandem stance:   BUE ball toss and catch x10 reps;  Patient required min VCs for balance stability, including to increase trunk control for less loss of balance with smaller base of support  Matrix: Fwd/bwd gait with 12. 5 lbs, 4 way x2 laps each and CGA, cues for posture and stepping strategies, occasional LOB  Instructed patient in 10 meter walk, 5 times sit<>Stand, timed up and go, Berg balance assessment etc to address goals, see below;   OUTCOME MEASURES: TEST Outcome 07/20/19  08/18/19 Interpretation  5 times sit<>stand 22.36ec 14.78 sec without arms >60 yo, >15 sec indicates increased risk for falls  10 meter walk test       .81          m/s  1.2 m/s <1.0 m/s indicates increased risk for falls; limited community ambulator  Timed up and Go   18 sec                9.14 sec without assistive device <14 sec indicates increased risk for falls  6 minute walk test    700       Feet  1175 feet 1000 feet is community Water quality scientist NT due to time  49/56 <36/56 (100% risk for falls),  37-45 (80% risk for falls); 46-51 (>50% risk for falls); 52-55 (lower risk <25% of  falls)                             PT Education - 08/18/19 1348    Education Details  LE strengthening, HEP    Person(s) Educated  Patient    Methods  Explanation;Verbal cues    Comprehension  Verbalized understanding;Returned demonstration;Verbal cues required;Need further instruction       PT Short Term Goals - 08/18/19 1420      PT SHORT TERM GOAL #1   Title  Patient will be independent in home exercise program to improve strength/mobility for better functional independence with ADLs.    Baseline  4/6- walking daily    Time  4    Period  Weeks    Status  Achieved    Target Date  08/17/19      PT SHORT TERM GOAL #2   Title  Patient (> 84 years old) will complete five times sit to stand test in < 15 seconds indicating an increased LE strength and improved balance.    Time  4    Period  Weeks    Status  Achieved    Target Date  08/17/19        PT Long Term Goals - 08/18/19 1420      PT LONG TERM GOAL #1   Title  Patient will increase Berg Balance score by > 6 points to demonstrate decreased fall risk during functional activities.    Baseline  4/6: 49/56    Time  8    Period  Weeks    Status  Partially Met    Target Date  09/14/19      PT LONG TERM GOAL #2   Title  Patient will increase six minute walk test distance to >1000 for progression to community ambulator and improve gait ability    Baseline  08/18/19: 1175    Time  8    Period  Weeks    Status  Achieved    Target Date  09/14/19      PT LONG TERM GOAL #3   Title  Patient will increase 10 meter walk test to >1.61ms as to improve gait speed for better community ambulation and to reduce fall risk.    Baseline  4/6: 1.2 m/s    Time  8    Period  Weeks    Status  Achieved    Target Date  09/14/19      PT LONG TERM GOAL #4   Title  Patient will reduce timed up and go to <11 seconds to reduce fall risk  and demonstrate improved transfer/gait ability.    Baseline  4/6: 9.14 sec    Time  8    Period  Weeks    Status  Achieved    Target Date  09/14/19          Plan - 08/19/19 07619   Clinical Impression Statement  Patient motivated and participated well within session. She was instructed in advanced balance exercise utilizing compliant surface to challenge stance control. Patient requires min VCs and CGA for safety especially with narrow base of support and trunk rotations. Instructed patient in outcome measures to address goals. She has made progress towards all goals and met most of them. She is still considered a mild fall risk. She would benefit from additional skilled PT intervention to address HEP and improve safety within her home.    Personal Factors and  Comorbidities  Age    Examination-Participation Restrictions  Cleaning;Driving    Stability/Clinical Decision Making  Stable/Uncomplicated    Rehab Potential  Good    PT Frequency  2x / week    PT Duration  8 weeks    PT Treatment/Interventions  Gait training;Therapeutic exercise;Balance training;Neuromuscular re-education;Therapeutic activities;Functional mobility training;Moist Heat;Patient/family education    PT Next Visit Plan  strengthening and balance training    PT Home Exercise Plan  RTB hip abd and ext in standing, heel raises    Consulted and Agree with Plan of Care  Patient;Family member/caregiver            Patient will benefit from skilled therapeutic intervention in order to improve the following deficits and impairments:     Visit Diagnosis: Muscle weakness (generalized)  Other lack of coordination  Other abnormalities of gait and mobility  Difficulty in walking, not elsewhere classified     Problem List Patient Active Problem List   Diagnosis Date Noted  . Hypothyroidism   . Chronic kidney disease   . Supplemental oxygen dependent   . Left middle cerebral artery stroke ([place]) 07/04/2019  .  Debility 07/04/2019  . CHF (congestive heart failure) ([place])   . Atrial fibrillation ([place])   . Acute cerebral infarction ([place])   . Non-ST elevation (NSTEMI) myocardial infarction ([place])   . Non-ST elevation MI (NSTEMI) ([place]) 06/28/2019  . Cardiac arrest due to underlying cardiac condition ([place]) 06/28/2019  . Cardiac arrest ([place]) 06/28/2019  . Ventilator dependent ([place])   . PAD (peripheral artery disease) ([place]) 01/11/2019  . Acute cystitis without hematuria 12/31/2018  . Diarrhea 12/31/2018  . CKD (chronic kidney disease) stage 3, GFR 30-59 ml/min 05/27/2018  . History of TIA (transient ischemic attack) 05/27/2018  . Persistent atrial fibrillation ([place]) 08/25/2017  . Osteopenia of multiple sites 11/09/2016  . DNR (do not resuscitate) 10/31/2016  . Encounter for general adult medical examination without abnormal findings 05/02/2016  . Vaccine counseling 11/01/2015  . Borderline diabetes mellitus 11/03/2013  . Pure hypercholesterolemia 11/03/2013  . Atrial fibrillation with RVR ([place]) 09/14/2013  . Acquired hypothyroidism 09/14/2013  . Diverticulosis 09/14/2013  . Essential hypertension 09/14/2013  . GERD without esophagitis 09/14/2013  . Mitral valve prolapse 09/14/2013  . H/O cardiac catheterization 08/12/2008    Jaleeya Mcnelly PT, DPT 08/18/2019, 3:25 PM  [place] MAIN [facility] SERVICES [address] [place], [place], [address] Phone: [phone]   Fax:  [phone]  Name: DANELL VAZQUEZ MRN: [id number] Date of Birth: 07-23-1930

## 2019-08-20 ENCOUNTER — Ambulatory Visit: Payer: Medicare PPO | Admitting: Neurology

## 2019-08-20 ENCOUNTER — Encounter: Payer: Self-pay | Admitting: Neurology

## 2019-08-20 ENCOUNTER — Other Ambulatory Visit: Payer: Self-pay

## 2019-08-20 VITALS — BP 93/60 | HR 80 | Temp 97.4°F | Ht 63.0 in | Wt 144.0 lb

## 2019-08-20 DIAGNOSIS — I6602 Occlusion and stenosis of left middle cerebral artery: Secondary | ICD-10-CM

## 2019-08-20 NOTE — Patient Instructions (Signed)
I had a long d/w patient and her husband about her recent stroke,atrial fibrillation, risk for recurrent stroke/TIAs, personally independently reviewed imaging studies and stroke evaluation results and answered questions.Continue Pradaxa (dabigatran) twice a day  for secondary stroke prevention and maintain strict control of hypertension with blood pressure goal below 130/90, diabetes with hemoglobin A1c goal below 6.5% and lipids with LDL cholesterol goal below 70 mg/dL. I also advised the patient to eat a healthy diet with plenty of whole grains, cereals, fruits and vegetables, exercise regularly and maintain ideal body weight .I recommend she follow-up with a cardiologist to discuss reducing the dosages of Cozaar and Toprol due to her hypotension.  Followup in the future with my nurse practitioner Shanda Bumps in 6 months or call earlier if necessary.  Stroke Prevention Some medical conditions and behaviors are associated with a higher chance of having a stroke. You can help prevent a stroke by making nutrition, lifestyle, and other changes, including managing any medical conditions you may have. What nutrition changes can be made?   Eat healthy foods. You can do this by: ? Choosing foods high in fiber, such as fresh fruits and vegetables and whole grains. ? Eating at least 5 or more servings of fruits and vegetables a day. Try to fill half of your plate at each meal with fruits and vegetables. ? Choosing lean protein foods, such as lean cuts of meat, poultry without skin, fish, tofu, beans, and nuts. ? Eating low-fat dairy products. ? Avoiding foods that are high in salt (sodium). This can help lower blood pressure. ? Avoiding foods that have saturated fat, trans fat, and cholesterol. This can help prevent high cholesterol. ? Avoiding processed and premade foods.  Follow your health care provider's specific guidelines for losing weight, controlling high blood pressure (hypertension), lowering high  cholesterol, and managing diabetes. These may include: ? Reducing your daily calorie intake. ? Limiting your daily sodium intake to 1,500 milligrams (mg). ? Using only healthy fats for cooking, such as olive oil, canola oil, or sunflower oil. ? Counting your daily carbohydrate intake. What lifestyle changes can be made?  Maintain a healthy weight. Talk to your health care provider about your ideal weight.  Get at least 30 minutes of moderate physical activity at least 5 days a week. Moderate activity includes brisk walking, biking, and swimming.  Do not use any products that contain nicotine or tobacco, such as cigarettes and e-cigarettes. If you need help quitting, ask your health care provider. It may also be helpful to avoid exposure to secondhand smoke.  Limit alcohol intake to no more than 1 drink a day for nonpregnant women and 2 drinks a day for men. One drink equals 12 oz of beer, 5 oz of wine, or 1 oz of hard liquor.  Stop any illegal drug use.  Avoid taking birth control pills. Talk to your health care provider about the risks of taking birth control pills if: ? You are over 12 years old. ? You smoke. ? You get migraines. ? You have ever had a blood clot. What other changes can be made?  Manage your cholesterol levels. ? Eating a healthy diet is important for preventing high cholesterol. If cholesterol cannot be managed through diet alone, you may also need to take medicines. ? Take any prescribed medicines to control your cholesterol as told by your health care provider.  Manage your diabetes. ? Eating a healthy diet and exercising regularly are important parts of managing your blood sugar. If  your blood sugar cannot be managed through diet and exercise, you may need to take medicines. ? Take any prescribed medicines to control your diabetes as told by your health care provider.  Control your hypertension. ? To reduce your risk of stroke, try to keep your blood pressure  below 130/80. ? Eating a healthy diet and exercising regularly are an important part of controlling your blood pressure. If your blood pressure cannot be managed through diet and exercise, you may need to take medicines. ? Take any prescribed medicines to control hypertension as told by your health care provider. ? Ask your health care provider if you should monitor your blood pressure at home. ? Have your blood pressure checked every year, even if your blood pressure is normal. Blood pressure increases with age and some medical conditions.  Get evaluated for sleep disorders (sleep apnea). Talk to your health care provider about getting a sleep evaluation if you snore a lot or have excessive sleepiness.  Take over-the-counter and prescription medicines only as told by your health care provider. Aspirin or blood thinners (antiplatelets or anticoagulants) may be recommended to reduce your risk of forming blood clots that can lead to stroke.  Make sure that any other medical conditions you have, such as atrial fibrillation or atherosclerosis, are managed. What are the warning signs of a stroke? The warning signs of a stroke can be easily remembered as BEFAST.  B is for balance. Signs include: ? Dizziness. ? Loss of balance or coordination. ? Sudden trouble walking.  E is for eyes. Signs include: ? A sudden change in vision. ? Trouble seeing.  F is for face. Signs include: ? Sudden weakness or numbness of the face. ? The face or eyelid drooping to one side.  A is for arms. Signs include: ? Sudden weakness or numbness of the arm, usually on one side of the body.  S is for speech. Signs include: ? Trouble speaking (aphasia). ? Trouble understanding.  T is for time. ? These symptoms may represent a serious problem that is an emergency. Do not wait to see if the symptoms will go away. Get medical help right away. Call your local emergency services (911 in the [place] Do not drive yourself  to the hospital.  Other signs of stroke may include: ? A sudden, severe headache with no known cause. ? Nausea or vomiting. ? Seizure. Where to find more information For more information, visit:  American Stroke Association: www.strokeassociation.org  National Stroke Association: www.stroke.org Summary  You can prevent a stroke by eating healthy, exercising, not smoking, limiting alcohol intake, and managing any medical conditions you may have.  Do not use any products that contain nicotine or tobacco, such as cigarettes and e-cigarettes. If you need help quitting, ask your health care provider. It may also be helpful to avoid exposure to secondhand smoke.  Remember BEFAST for warning signs of stroke. Get help right away if you or a loved one has any of these signs. This information is not intended to replace advice given to you by your health care provider. Make sure you discuss any questions you have with your health care provider. Document Revised: 04/12/2017 Document Reviewed: 06/05/2016 Elsevier Patient Education  2020 [employer].

## 2019-08-20 NOTE — Progress Notes (Signed)
[facility] [address] [place]. [place] [address] [phone]       OFFICE FOLLOW-UP NOTE  [name] Date of Birth:  1930-10-05 Medical Record Number:  [id number]   HPI: Ms. Latterell is a 86 year old lady seen today for initial office follow-up visit following hospital consultation for stroke in February 2021.  History is obtained from the patient, review of electronic medical records and I personally reviewed imaging films in PACS.  She has past medical history of atrial fibrillation, hyperlipidemia, chronic kidney disease who was admitted with chest pain followed by ventricular fibrillation cardiac arrest in the emergency room.  She required CPR for approximately 5 minutes with return of spontaneous circulation.  She has subsequently found to have NSTEMI and had PCI and was placed on IV heparin after that.  After she was extubated she did well initially but then subsequently developed altered mental status and was found to be unresponsive with eyes closed not following commands and nonverbal.  Heparin was discontinued and CT scan was obtained which showed no hemorrhage and CT angiogram showed no emergent finding of LVO or large vessels stenosis or occlusion.  She recovered quickly back to baseline.  MRI scan showed a small acute infarct in the left frontal gyrus with area of encephalomalacia and gliosis in the posterior right insula and right temporal lobe.  2D echo showed normal ejection fraction.  LDL cholesterol was 61 mg percent and hemoglobin A1c was 6.1.  She was initially placed on IV heparin and subsequently changed to Pradaxa at discharge.  Patient states she is done well since discharge.  She said no stroke or TIA symptoms.  She is tolerating Pradaxa well without bruising or bleeding.  Blood pressure continues to be on the low side and today it is 93/60.  She is remains on Lipitor which is tolerating well without muscle aches and pains.  She states she did have a  remote history of possible stroke 6 years ago when she had episode of sudden onset of dizziness and fell.  She saw her primary care physician at [facility] but was not admitted and did not see a neurologist.  MRI scan of the brain on 08/08/2011 showed a punctate acute nonhemorrhagic right insular cortex infarct.  ROS:   14 system review of systems is positive for no complaints today and all systems negative  PMH:  Past Medical History:  Diagnosis Date  . Atrial fibrillation (HCC) 2011  . Hyperlipidemia   . Hypertension   . Stroke (HCC)   . Thyroid disease     Social History:  Social History   Socioeconomic History  . Marital status: Married    Spouse name: Not on file  . Number of children: Not on file  . Years of education: Not on file  . Highest education level: Not on file  Occupational History  . Not on file  Tobacco Use  . Smoking status: Never Smoker  . Smokeless tobacco: Never Used  Substance and Sexual Activity  . Alcohol use: No  . Drug use: No  . Sexual activity: Not on file  Other Topics Concern  . Not on file  Social History Narrative  . Not on file   Social Determinants of Health   Financial Resource Strain:   . Difficulty of Paying Living Expenses:   Food Insecurity:   . Worried About Programme researcher, broadcasting/film/video in the Last Year:   . [employer] of Food in the Last Year:  Transportation Needs:   . Freight forwarder (Medical):   Marland Kitchen Lack of Transportation (Non-Medical):   Physical Activity:   . Days of Exercise per Week:   . Minutes of Exercise per Session:   Stress:   . Feeling of Stress :   Social Connections:   . Frequency of Communication with Friends and Family:   . Frequency of Social Gatherings with Friends and Family:   . Attends Religious Services:   . Active Member of Clubs or Organizations:   . Attends Banker Meetings:   Marland Kitchen Marital Status:   Intimate Partner Violence:   . Fear of Current or Ex-Partner:   . Emotionally  Abused:   Marland Kitchen Physically Abused:   . Sexually Abused:     Medications:   Current Outpatient Medications on File Prior to Visit  Medication Sig Dispense Refill  . atorvastatin (LIPITOR) 80 MG tablet Take 1 tablet (80 mg total) by mouth daily at 6 PM. 30 tablet 0  . clopidogrel (PLAVIX) 75 MG tablet Take 1 tablet (75 mg total) by mouth daily. 30 tablet 1  . dabigatran (PRADAXA) 150 MG CAPS capsule Take 1 capsule (150 mg total) by mouth every 12 (twelve) hours. (Patient taking differently: Take 75 mg by mouth every 12 (twelve) hours. ) 60 capsule 1  . furosemide (LASIX) 20 MG tablet Take 1 tablet (20 mg total) by mouth daily. 30 tablet 1  . levothyroxine (SYNTHROID) 88 MCG tablet Take 1 tablet (88 mcg total) by mouth daily. 30 tablet 1  . losartan (COZAAR) 25 MG tablet Take 0.5 tablets (12.5 mg total) by mouth daily. 30 tablet 1  . metoprolol succinate (TOPROL-XL) 50 MG 24 hr tablet Take 3 tablets (150 mg total) by mouth daily. Take with or immediately following a meal. 30 tablet 1   No current facility-administered medications on file prior to visit.    Allergies:   Allergies  Allergen Reactions  . Metronidazole Diarrhea and Nausea And Vomiting  . Oxycodone Other (See Comments)    Altered mental status    Physical Exam General: Pleasant elderly lady, seated, in no evident distress Head: head normocephalic and atraumatic.  Neck: supple with no carotid or supraclavicular bruits Cardiovascular: regular rate and rhythm, no murmurs Musculoskeletal: Mild kyphoscoliosis Skin:  no rash/petichiae Vascular:  Normal pulses all extremities Vitals:   08/20/19 1419  BP: 93/60  Pulse: 80  Temp: (!) 97.4 F (36.3 C)   Neurologic Exam Mental Status: Awake and fully alert. Oriented to place and time. Recent and remote memory intact. Attention span, concentration and fund of knowledge appropriate. Mood and affect appropriate.  Cranial Nerves: Fundoscopic exam reveals sharp disc margins. Pupils  equal, briskly reactive to light. Extraocular movements full without nystagmus.  Mild esotropia and hyper tropia of the left eye visual fields full to confrontation. Hearing diminished bilaterally. Facial sensation intact. Face, tongue, palate moves normally and symmetrically.  Motor: Normal bulk and tone. Normal strength in all tested extremity muscles. Sensory.: intact to touch ,pinprick .position and vibratory sensation.  Coordination: Rapid alternating movements normal in all extremities. Finger-to-nose and heel-to-shin performed accurately bilaterally. Gait and Station: Arises from chair without difficulty. Stance is normal. Gait demonstrates normal stride length and balance . Able to heel, toe and tandem walk with great difficulty.  Reflexes: 1+ and symmetric. Toes downgoing.   NIHSS 0 Modified Rankin  1   ASSESSMENT: 86 year old Caucasian lady with left frontal MCA branch infarct and February 2021 secondary to myocardial infarction, cardiac  arrest and atrial fibrillation.  She is doing remarkably well with no physical deficits from her stroke.  Remote history of embolic right MCA branch infarct in March 2013     PLAN: I had a long d/w patient and her husband about her recent stroke,atrial fibrillation, risk for recurrent stroke/TIAs, personally independently reviewed imaging studies and stroke evaluation results and answered questions.Continue Pradaxa (dabigatran) twice a day  for secondary stroke prevention and maintain strict control of hypertension with blood pressure goal below 130/90, diabetes with hemoglobin A1c goal below 6.5% and lipids with LDL cholesterol goal below 70 mg/dL. I also advised the patient to eat a healthy diet with plenty of whole grains, cereals, fruits and vegetables, exercise regularly and maintain ideal body weight .I recommend she follow-up with a cardiologist to discuss reducing the dosages of Cozaar and Toprol due to her hypotension.  Followup in the future with  my nurse practitioner Janett Billow in 6 months or call earlier if necessary. Greater than 50% of time during this 30 minute visit was spent on counseling,explanation of diagnosis, planning of further management, discussion with patient and family and coordination of care Antony Contras, MD  [facility] [address] [place], [place] [address]  Phone [phone] Fax [phone] Note: This document was prepared with digital dictation and possible smart phrase technology. Any transcriptional errors that result from this process are unintentional

## 2019-08-21 ENCOUNTER — Other Ambulatory Visit: Payer: Self-pay

## 2019-08-21 ENCOUNTER — Ambulatory Visit: Payer: Medicare PPO | Admitting: Occupational Therapy

## 2019-08-21 ENCOUNTER — Ambulatory Visit: Payer: Medicare PPO

## 2019-08-21 DIAGNOSIS — M6281 Muscle weakness (generalized): Secondary | ICD-10-CM | POA: Diagnosis not present

## 2019-08-21 DIAGNOSIS — R278 Other lack of coordination: Secondary | ICD-10-CM

## 2019-08-21 DIAGNOSIS — R2689 Other abnormalities of gait and mobility: Secondary | ICD-10-CM

## 2019-08-21 DIAGNOSIS — R262 Difficulty in walking, not elsewhere classified: Secondary | ICD-10-CM

## 2019-08-21 NOTE — Therapy (Signed)
[place] MAIN [facility] SERVICES [address] [place], [place], [address] Phone: [phone]   Fax:  [phone]  Physical Therapy Treatment Physical Therapy Progress Note   Dates of reporting period  07/20/19   to   08/21/19   Patient Details  Name: Courtney Chan MRN: [id number] Date of Birth: Oct 07, 1930 Referring Provider (Courtney Chan): Elbert Ewings   Encounter Date: 08/21/2019  Courtney Chan End of Session - 08/21/19 1048    Visit Number  10    Number of Visits  17    Date for Courtney Chan Re-Evaluation  09/14/19    Authorization Type  10/10 PN 08/21/19    Courtney Chan Start Time  1001    Courtney Chan Stop Time  1040    Courtney Chan Time Calculation (min)  39 min    Equipment Utilized During Treatment  Gait belt    Activity Tolerance  Patient tolerated treatment well    Behavior During Therapy  [facility] for tasks assessed/performed       Past Medical History:  Diagnosis Date  . Atrial fibrillation ([place]) 2011  . Hyperlipidemia   . Hypertension   . Stroke ([place])   . Thyroid disease     Past Surgical History:  Procedure Laterality Date  . ABDOMINAL HYSTERECTOMY    . BREAST BIOPSY Left 1980   Negative  . CORONARY/GRAFT ACUTE MI REVASCULARIZATION N/A 06/28/2019   Procedure: Coronary/Graft Acute MI Revascularization;  Surgeon: Leonie Man, MD;  Location: [place] [facility];  Service: Cardiovascular;  Laterality: N/A;  . LEFT HEART CATH AND CORONARY ANGIOGRAPHY N/A 06/28/2019   Procedure: LEFT HEART CATH AND CORONARY ANGIOGRAPHY;  Surgeon: Leonie Man, MD;  Location: [place] CV [facility];  Service: Cardiovascular;  Laterality: N/A;    There were no vitals filed for this visit.  Subjective Assessment - 08/21/19 1007    Subjective  Reports she walked a 1/2 mile this morning. no falls or LOB since last session. No pain per patient report. Has to leave early for an appointment.    Pertinent History  Courtney Chan is a 86 y.o. right-handed female with history of atrial fibrillation  followed by Dr.Paraschos at [facility], hyperlipidemia, hypertension. Patient lives with spouse independent prior to admission noted some recent cognitive decline.  Presented 06/28/2019 with chest pain at [facility].   She is found to have non-STEMI with PCI completed per cardiology services.  On 06/29/2019 after extubation altered mental status not following commands.  MRI showed small acute infarction.  Per report small acute infarct involving the posterior aspect of the left superior frontal gyrus.  She went to inpatient rehab for a week and now is back at her homoe.    How long can you sit comfortably?  unlimited    How long can you stand comfortably?  10    How long can you walk comfortably?  10    Patient Stated Goals  to walk better.    Currently in Pain?  No/denies           Did goals on 08/18/19  Treatment: Neuromuscular Training: Standing on airex foam: -standing one foot on airex, one foot on 4 inch step 30 second holds in modified tandem stance x 2 sets each LE -airex pad: small ball toss onto target for perturbations, coordination, and dual task x 20 balls.  -lateral stepping airex pad, 4" step, airex pad 12x each direction, SUE support  Step over and back orange hurdle with BLE 12x each LE; SUE  support Balloon taps reaching into/out of BOS with good ability to retain COM x 3 minutes [place], very challenging for patient to perform cross body coordination x 8 trials with Courtney Chan support.               Patient required min VCs for balance stability, including to increase trunk control for less loss of balance with smaller base of support   Matrix: Fwd/bwd gait with 12. 5 lbs, 4 way x2 laps each and CGA, cues for posture and stepping strategies, occasional LOB  seated: Opposite UE/LE raises: very challenging requiring max cues for body mechanics and sequencing.   Patient's condition has the potential to improve in response to therapy. Maximum improvement is yet to be obtained.  The anticipated improvement is attainable and reasonable in a generally predictable time.  Patient reports she is ale to walk better and is more stable than she was before but not yet at her "normal".                Courtney Chan Education - 08/21/19 1008    Education Details  body mechanics, stability, exercise technique    Person(s) Educated  Patient    Methods  Explanation;Demonstration;Tactile cues;Verbal cues    Comprehension  Verbalized understanding;Returned demonstration;Verbal cues required;Tactile cues required       Courtney Chan Short Term Goals - 08/18/19 1420      Courtney Chan SHORT TERM GOAL #1   Title  Patient will be independent in home exercise program to improve strength/mobility for better functional independence with ADLs.    Baseline  4/6- walking daily    Time  4    Period  Weeks    Status  Achieved    Target Date  08/17/19      Courtney Chan SHORT TERM GOAL #2   Title  Patient (> 12 years old) will complete five times sit to stand test in < 15 seconds indicating an increased LE strength and improved balance.    Time  4    Period  Weeks    Status  Achieved    Target Date  08/17/19        Courtney Chan Long Term Goals - 08/18/19 1420      Courtney Chan LONG TERM GOAL #1   Title  Patient will increase Berg Balance score by > 6 points to demonstrate decreased fall risk during functional activities.    Baseline  4/6: 49/56    Time  8    Period  Weeks    Status  Partially Met    Target Date  09/14/19      Courtney Chan LONG TERM GOAL #2   Title  Patient will increase six minute walk test distance to >1000 for progression to community ambulator and improve gait ability    Baseline  08/18/19: 1175    Time  8    Period  Weeks    Status  Achieved    Target Date  09/14/19      Courtney Chan LONG TERM GOAL #3   Title  Patient will increase 10 meter walk test to >1.32ms as to improve gait speed for better community ambulation and to reduce fall risk.    Baseline  4/6: 1.2 m/s    Time  8    Period  Weeks    Status  Achieved     Target Date  09/14/19      Courtney Chan LONG TERM GOAL #4   Title  Patient will reduce timed up and go to <11  seconds to reduce fall risk and demonstrate improved transfer/gait ability.    Baseline  4/6: 9.14 sec    Time  8    Period  Weeks    Status  Achieved    Target Date  09/14/19            Plan - 08/21/19 1051    Clinical Impression Statement  Please refer to session on 08/19/19 for details regarding goal progression and POC. Patient is challenged with cross body coordination this session requiring tactile, verbal, and visual cueing for task performance. Unstable surfaces are challenging with perturbations however patient is able to maintain her balance with close CGA. Patient's condition has the potential to improve in response to therapy. Maximum improvement is yet to be obtained. The anticipated improvement is attainable and reasonable in a generally predictable time.She would benefit from additional skilled Courtney Chan intervention to address HEP and improve safety within her home.    Personal Factors and Comorbidities  Age    Examination-Participation Restrictions  Cleaning;Driving    Stability/Clinical Decision Making  Stable/Uncomplicated    Rehab Potential  Good    Courtney Chan Frequency  2x / week    Courtney Chan Duration  8 weeks    Courtney Chan Treatment/Interventions  Gait training;Therapeutic exercise;Balance training;Neuromuscular re-education;Therapeutic activities;Functional mobility training;Moist Heat;Patient/family education    Courtney Chan Next Visit Plan  strengthening and balance training    Courtney Chan Home Exercise Plan  RTB hip abd and ext in standing, heel raises    Consulted and Agree with Plan of Care  Patient;Family member/caregiver       Patient will benefit from skilled therapeutic intervention in order to improve the following deficits and impairments:  Abnormal gait, Difficulty walking, Decreased strength, Decreased balance, Pain, Decreased activity tolerance  Visit Diagnosis: Muscle weakness  (generalized)  Other lack of coordination  Other abnormalities of gait and mobility  Difficulty in walking, not elsewhere classified     Problem List Patient Active Problem List   Diagnosis Date Noted  . Hypothyroidism   . Chronic kidney disease   . Supplemental oxygen dependent   . 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 Debility 07/04/2019  . CHF (congestive heart failure) ([place])   . Atrial fibrillation ([place])   . Acute cerebral infarction ([place])   . Non-ST elevation (NSTEMI) myocardial infarction ([place])   . Non-ST elevation MI (NSTEMI) ([place]) 06/28/2019  . Cardiac arrest due to underlying cardiac condition ([place]) 06/28/2019  . Cardiac arrest ([place]) 06/28/2019  . Ventilator dependent ([place])   . PAD (peripheral artery disease) ([place]) 01/11/2019  . Acute cystitis without hematuria 12/31/2018  . Diarrhea 12/31/2018  . CKD (chronic kidney disease) stage 3, GFR 30-59 ml/min 05/27/2018  . History of TIA (transient ischemic attack) 05/27/2018  . Persistent atrial fibrillation ([place]) 08/25/2017  . Osteopenia of multiple sites 11/09/2016  . DNR (do not resuscitate) 10/31/2016  . Encounter for general adult medical examination without abnormal findings 05/02/2016  . Vaccine counseling 11/01/2015  . Borderline diabetes mellitus 11/03/2013  . Pure hypercholesterolemia 11/03/2013  . Atrial fibrillation with RVR ([place]) 09/14/2013  . Acquired hypothyroidism 09/14/2013  . Diverticulosis 09/14/2013  . Essential hypertension 09/14/2013  . GERD without esophagitis 09/14/2013  . Mitral valve prolapse 09/14/2013  . H/O cardiac catheterization 08/12/2008   Courtney Chan, Courtney Chan, Courtney Chan   08/21/2019, 10:57 AM  [place] MAIN [facility] SERVICES [address] [place], [place], [address] Phone: [phone]   Fax:  [phone]  Name: Courtney Chan  MRN: [id number] Date of Birth: 1930/07/24

## 2019-08-25 ENCOUNTER — Other Ambulatory Visit: Payer: Self-pay

## 2019-08-25 ENCOUNTER — Encounter: Payer: Self-pay | Admitting: Occupational Therapy

## 2019-08-25 ENCOUNTER — Ambulatory Visit: Payer: Medicare PPO | Admitting: Occupational Therapy

## 2019-08-25 DIAGNOSIS — M6281 Muscle weakness (generalized): Secondary | ICD-10-CM

## 2019-08-25 DIAGNOSIS — R278 Other lack of coordination: Secondary | ICD-10-CM

## 2019-08-25 NOTE — Therapy (Addendum)
[place] MAIN [facility] SERVICES [address] [place], [place], [address] Phone: [phone]   Fax:  [phone]  Occupational Therapy Treatment  Patient Details  Name: Courtney Chan MRN: [id number] Date of Birth: August 25, 1930 No data recorded  Encounter Date: 08/25/2019  OT End of Session - 08/25/19 0946    Visit Number  8    Number of Visits  16    Date for OT Re-Evaluation  09/15/19    Authorization Type  [employer]    OT Start Time  [phone]    OT Stop Time  1015    OT Time Calculation (min)  41 min    Activity Tolerance  Patient tolerated treatment well    Behavior During Therapy  [facility] for tasks assessed/performed       Past Medical History:  Diagnosis Date  . Atrial fibrillation ([place]) 2011  . Hyperlipidemia   . Hypertension   . Stroke ([place])   . Thyroid disease     Past Surgical History:  Procedure Laterality Date  . ABDOMINAL HYSTERECTOMY    . BREAST BIOPSY Left 1980   Negative  . CORONARY/GRAFT ACUTE MI REVASCULARIZATION N/A 06/28/2019   Procedure: Coronary/Graft Acute MI Revascularization;  Surgeon: Leonie Man, MD;  Location: [place] [facility] LAB;  Service: Cardiovascular;  Laterality: N/A;  . LEFT HEART CATH AND CORONARY ANGIOGRAPHY N/A 06/28/2019   Procedure: LEFT HEART CATH AND CORONARY ANGIOGRAPHY;  Surgeon: Leonie Man, MD;  Location: [place] [facility];  Service: Cardiovascular;  Laterality: N/A;    There were no vitals filed for this visit.  Subjective Assessment - 08/25/19 0945    Subjective   Patient reports that her chest pain has improved.    Patient is accompanied by:  Family member    Pertinent History  Patient was in [place] for 2 weeks with one of those weeks for rehab.No home health.  Came home a week ago last Sunday.    Patient Stated Goals  Patient wants to get stronger and back to normal, be more independent.    Currently in Pain?  No/denies       OT TREATMENT   Neuro muscular  re-education:  Pt. performed [facility] tasks using the Grooved pegboard. Pt. worked on grasping the grooved pegs from a horizontal position, and moving the pegs to a vertical position in the hand to prepare for placing them in the grooved slot. Pt. worked on translatory movements with the left hand, as well as using his hand for storage. Pt. Required verbal, and visual cues.   Therapeutic Exercise:  Pt. Worked on the [employer] for 5 min. With the workload set at 2.5#. With constant monitoring of the BUEs. Pt. Worked on changing, and alternating forward reverse position every 2 min. Rest breaks were required. Pt. performed2# dowel ex. For UE strengthening secondary to weakness.Chest press,andcircular patternswere performed.2# dumbbell ex. for elbow flexion and extension, forearm supination/pronation,andwrist flexion/extension.Pt. requires rest breaks and verbal cues for proper technique.  Pt. worked on the gross gripping with the [employer] dynamometer Left; 26.8#, 21.8#, 25.8#, 26.4# Right: 32.4#, 29.8#, 25.4#, 27.6#  Response to Treatment:  Pt. continues to make progress, and reports that it Is easier to pick up more objects at home. Pt.tolerated UE strengthening well, however required verbal, and frequent tactile cues for movement patterns. Pt.continues torequire verbal cues, tactile cues, and cues for visual demonstration prior to, and during the exercises for proper movement patterns, and form. Pt.requires restbreaks.Pt. continues to  require cues for motor planning through the fine motor movements using the left hand with emphasis placed on performing the tasks slowly while focusing on movement patterns. Pt. Continues to work on improving LUE, coordination, and functioning in order to be able to increase, and improve engagement of the LUE during ADLs, and IADL tasks.                     OT Education - 08/25/19 0946    Education Details  Ther. Ex    Person(s)  Educated  Patient    Methods  Explanation;Demonstration    Comprehension  Verbalized understanding;Returned demonstration          OT Long Term Goals - 07/21/19 1000      OT LONG TERM GOAL #1   Title  Patient will be independent with home exercise program for strengthening and coordination.    Baseline  no current program    Time  8    Period  Weeks    Status  New    Target Date  09/15/19      OT LONG TERM GOAL #2   Title  Patient will complete upper body and lower body bathing with modified independence.    Baseline  min assist at eval    Time  8    Period  Weeks    Status  New    Target Date  09/15/19      OT LONG TERM GOAL #3   Title  Patient to perform UB and LB dressing skills with modified independence.    Baseline  moderate assist at evaluation    Time  8    Period  Weeks    Status  New    Target Date  09/15/19      OT LONG TERM GOAL #4   Title  Patient will complete bed making with modified independence.    Baseline  mod assist at eval    Time  4    Period  Weeks    Status  New    Target Date  08/11/19      OT LONG TERM GOAL #5   Title  Patient will complete light meal preparation with modified independence    Baseline  unable at evaluation    Time  8    Period  Weeks    Status  New    Target Date  09/15/19      Long Term Additional Goals   Additional Long Term Goals  Yes      OT LONG TERM GOAL #6   Title  Patient will increase strength in LUE by 1 mm grade to perform homemaking tasks with modified independence.    Baseline  decreased strength in LUE at eval    Time  8    Period  Weeks    Status  New    Target Date  09/15/19            Plan - 08/25/19 0947    Clinical Impression Statement  Pt. continues to make progress, and reports that it Is easier to pick up more objects at home. Pt.tolerated UE strengthening well, however required verbal, and frequent tactile cues for movement patterns. Pt.continues torequire verbal cues, tactile  cues, and cues for visual demonstration prior to, and during the exercises for proper movement patterns, and form. Pt.requires restbreaks.Pt. continues to require cues for motor planning through the fine motor movements using the left hand with emphasis placed on  performing the tasks slowly while focusing on movement patterns. Pt. Continues to work on improving LUE, coordination, and functioning in order to be able to increase, and improve engagement of the LUE during ADLs, and IADL tasks.   OT Occupational Profile and History  Detailed Assessment- Review of Records and additional review of physical, cognitive, psychosocial history related to current functional performance    Occupational performance deficits (Please refer to evaluation for details):  ADL's;IADL's;Leisure    Body Structure / Function / Physical Skills  ADL;Coordination;Endurance;GMC;UE functional use;Balance;Decreased knowledge of use of DME;Flexibility;IADL;Pain;Dexterity;FMC;Strength;Proprioception;Mobility;ROM    Psychosocial Skills  Environmental  Adaptations;Routines and Behaviors;Habits    Rehab Potential  Good    Clinical Decision Making  Limited treatment options, no task modification necessary    Comorbidities Affecting Occupational Performance:  May have comorbidities impacting occupational performance    Modification or Assistance to Complete Evaluation   No modification of tasks or assist necessary to complete eval    OT Frequency  2x / week    OT Duration  8 weeks    OT Treatment/Interventions  Self-care/ADL training;Cryotherapy;Therapeutic exercise;DME and/or AE instruction;Functional Mobility Training;Balance training;Neuromuscular education;Manual Therapy;Moist Heat;Contrast Bath;Energy conservation;Passive range of motion;Therapeutic activities;Patient/family education    Consulted and Agree with Plan of Care  Patient       Patient will benefit from skilled therapeutic intervention in order to improve the  following deficits and impairments:   Body Structure / Function / Physical Skills: ADL, Coordination, Endurance, GMC, UE functional use, Balance, Decreased knowledge of use of DME, Flexibility, IADL, Pain, Dexterity, FMC, Strength, Proprioception, Mobility, ROM   Psychosocial Skills: Environmental  Adaptations, Routines and Behaviors, Habits   Visit Diagnosis: Muscle weakness (generalized)  Other lack of coordination    Problem List Patient Active Problem List   Diagnosis Date Noted  . Hypothyroidism   . Chronic kidney disease   . Supplemental oxygen dependent   . 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 Debility 07/04/2019  . CHF (congestive heart failure) ([facility])   . Atrial fibrillation ([facility])   . Acute cerebral infarction ([facility])   . Non-ST elevation (NSTEMI) myocardial infarction ([facility])   . Non-ST elevation MI (NSTEMI) ([facility]) 06/28/2019  . Cardiac arrest due to underlying cardiac condition ([facility]) 06/28/2019  . Cardiac arrest ([facility]) 06/28/2019  . Ventilator dependent ([facility])   . PAD (peripheral artery disease) ([facility]) 01/11/2019  . Acute cystitis without hematuria 12/31/2018  . Diarrhea 12/31/2018  . CKD (chronic kidney disease) stage 3, GFR 30-59 ml/min 05/27/2018  . History of TIA (transient ischemic attack) 05/27/2018  . Persistent atrial fibrillation ([facility]) 08/25/2017  . Osteopenia of multiple sites 11/09/2016  . DNR (do not resuscitate) 10/31/2016  . Encounter for general adult medical examination without abnormal findings 05/02/2016  . Vaccine counseling 11/01/2015  . Borderline diabetes mellitus 11/03/2013  . Pure hypercholesterolemia 11/03/2013  . Atrial fibrillation with RVR ([facility]) 09/14/2013  . Acquired hypothyroidism 09/14/2013  . Diverticulosis 09/14/2013  . Essential hypertension 09/14/2013  . GERD without esophagitis 09/14/2013  . Mitral valve prolapse 09/14/2013  . H/O cardiac catheterization 08/12/2008    Olegario Messier, MS, OTR/L 08/25/2019,  9:49 AM  [place] [facility] MAIN [facility] SERVICES [address] [place], [place], [address] Phone: [phone]   Fax:  [phone]  Name: MAYBREE RILING MRN: [id number] Date of Birth: [birth year]

## 2019-08-27 ENCOUNTER — Encounter: Payer: Medicare PPO | Attending: Registered Nurse | Admitting: Physical Medicine and Rehabilitation

## 2019-08-27 ENCOUNTER — Encounter: Payer: Self-pay | Admitting: Physical Medicine and Rehabilitation

## 2019-08-27 ENCOUNTER — Other Ambulatory Visit: Payer: Self-pay

## 2019-08-27 ENCOUNTER — Encounter: Payer: Medicare PPO | Admitting: Occupational Therapy

## 2019-08-27 VITALS — BP 106/66 | HR 97 | Temp 97.3°F | Ht 63.5 in | Wt 144.4 lb

## 2019-08-27 DIAGNOSIS — I10 Essential (primary) hypertension: Secondary | ICD-10-CM | POA: Insufficient documentation

## 2019-08-27 DIAGNOSIS — I5030 Unspecified diastolic (congestive) heart failure: Secondary | ICD-10-CM | POA: Diagnosis present

## 2019-08-27 DIAGNOSIS — R5381 Other malaise: Secondary | ICD-10-CM | POA: Diagnosis present

## 2019-08-27 DIAGNOSIS — K5900 Constipation, unspecified: Secondary | ICD-10-CM

## 2019-08-27 DIAGNOSIS — I63512 Cerebral infarction due to unspecified occlusion or stenosis of left middle cerebral artery: Secondary | ICD-10-CM | POA: Insufficient documentation

## 2019-08-27 DIAGNOSIS — R1032 Left lower quadrant pain: Secondary | ICD-10-CM | POA: Diagnosis not present

## 2019-08-27 DIAGNOSIS — I4891 Unspecified atrial fibrillation: Secondary | ICD-10-CM | POA: Insufficient documentation

## 2019-08-27 MED ORDER — SENNA 8.6 MG PO TABS: 2.0000 | ORAL_TABLET | Freq: Every day | ORAL | 0 refills | Status: DC

## 2019-08-27 NOTE — Progress Notes (Signed)
Subjective:    Patient ID: Courtney Chan, female    DOB: [birth year], 86 y.o.   MRN: [id number]  HPI  Courtney Chan is a 86 year old woman seen for follow-up of left MCA stroke.   She is doing very well at home! Walking half a mile per day. Denies falls. Continues to receive outpatient rehabilitation, and as per daughter, is to be discharged soon.  She has questions regarding when she may return to driving.   She does have mild right sided groin pain. It usually hurts more at night. Very mild pain, 1/10, not significantly bothersome to her.   Does feel constipated. She is used to having a BM every morning after breakfast.   Pain Inventory Average Pain 0 Pain Right Now 0 My pain is no pain  In the last 24 hours, has pain interfered with the following? General activity 0 Relation with others 0 Enjoyment of life 0 What TIME of day is your pain at its worst? no pain Sleep (in general) Good  Pain is worse with: no pain Pain improves with: no pain Relief from Meds: no pain  Mobility walk without assistance how many minutes can you walk? 25  Function retired  Neuro/Psych No problems in this area  Prior Studies Any changes since last visit?  no  Physicians involved in your care Any changes since last visit?  no   Family History  Problem Relation Age of Onset  . Breast cancer Maternal Grandmother        18's   Social History   Socioeconomic History  . Marital status: Married    Spouse name: Not on file  . Number of children: Not on file  . Years of education: Not on file  . Highest education level: Not on file  Occupational History  . Not on file  Tobacco Use  . Smoking status: Never Smoker  . Smokeless tobacco: Never Used  Substance and Sexual Activity  . Alcohol use: No  . Drug use: No  . Sexual activity: Not on file  Other Topics Concern  . Not on file  Social History Narrative  . Not on file   Social Determinants of Health   Financial Resource  Strain:   . Difficulty of Paying Living Expenses:   Food Insecurity:   . Worried About Programme researcher, broadcasting/film/video in the Last Year:   . Barista in the Last Year:   Transportation Needs:   . Freight forwarder (Medical):   Marland Kitchen Lack of Transportation (Non-Medical):   Physical Activity:   . Days of Exercise per Week:   . Minutes of Exercise per Session:   Stress:   . Feeling of Stress :   Social Connections:   . Frequency of Communication with Friends and Family:   . Frequency of Social Gatherings with Friends and Family:   . Attends Religious Services:   . Active Member of Clubs or Organizations:   . Attends Banker Meetings:   Marland Kitchen Marital Status:    Past Surgical History:  Procedure Laterality Date  . ABDOMINAL HYSTERECTOMY    . BREAST BIOPSY Left 1980   Negative  . CORONARY/GRAFT ACUTE MI REVASCULARIZATION N/A 06/28/2019   Procedure: Coronary/Graft Acute MI Revascularization;  Surgeon: Marykay Lex, MD;  Location: [facility] INVASIVE CV LAB;  Service: Cardiovascular;  Laterality: N/A;  . LEFT HEART CATH AND CORONARY ANGIOGRAPHY N/A 06/28/2019   Procedure: LEFT HEART CATH AND CORONARY ANGIOGRAPHY;  Surgeon:  Leonie Man, MD;  Location: [place] CV LAB;  Service: Cardiovascular;  Laterality: N/A;   Past Medical History:  Diagnosis Date  . Atrial fibrillation ([place]) 2011  . Hyperlipidemia   . Hypertension   . Stroke ([place])   . Thyroid disease    BP 106/66   Pulse 97   Temp (!) 97.3 F (36.3 C)   Ht 5' 3.5" (1.613 m)   Wt 144 lb 6.4 oz (65.5 kg)   SpO2 96%   BMI 25.18 kg/m   Opioid Risk Score:   Fall Risk Score:  `1  Depression screen PHQ 2/9  Depression screen PHQ 2/9 08/27/2019  Decreased Interest 0  Down, Depressed, Hopeless 0  PHQ - 2 Score 0    Review of Systems  Constitutional: Negative.   HENT: Negative.   Eyes: Negative.   Respiratory: Negative.   Cardiovascular: Negative.   Gastrointestinal: Negative.   Endocrine: Negative.     Genitourinary: Negative.   Musculoskeletal: Negative.   Skin: Negative.   Allergic/Immunologic: Negative.   Neurological: Negative.   Hematological: Bruises/bleeds easily.       Plavix  Psychiatric/Behavioral: Negative.   All other systems reviewed and are negative.      Objective:   Physical Exam Gen: no distress, normal appearing HEENT: oral mucosa pink and moist, NCAT Cardio: Reg rate Chest: normal effort, normal rate of breathing Abd: soft, non-distended Ext: no edema Skin: intact; left groin shows no evidence of hernia or swelling.  Neuro: AOx3 Musculoskeletal: 5/5 strength throughout. Sensation intact throughout. Ambulating in hallway with excellent gait speed! Psych: pleasant, normal affect     Assessment & Plan:  Left MCA Stroke Constipation Left groin pain  --Progressing very well! --Has follow-up with PCP and cardiology and neurology.  --Discussed gradual return to driving instructions. --Discussed that etiology of left groin pain does not appear to be from hernia, is most likely from hip. Is not bothering her much at this time, but if it does, asked her to recontact [place]. --[facility] to enable BM in AM as she is accustomed to.  Can follow up as needed at this time. All questions answered. 20 minutes face to face patient care time provided today.

## 2019-08-28 ENCOUNTER — Encounter: Payer: Self-pay | Admitting: Physical Medicine and Rehabilitation

## 2019-09-01 ENCOUNTER — Ambulatory Visit: Payer: Medicare PPO | Admitting: Occupational Therapy

## 2019-09-01 ENCOUNTER — Encounter: Payer: Self-pay | Admitting: Physical Therapy

## 2019-09-01 ENCOUNTER — Other Ambulatory Visit: Payer: Self-pay

## 2019-09-01 ENCOUNTER — Ambulatory Visit: Payer: Medicare PPO

## 2019-09-01 ENCOUNTER — Encounter: Payer: Self-pay | Admitting: Occupational Therapy

## 2019-09-01 DIAGNOSIS — I63512 Cerebral infarction due to unspecified occlusion or stenosis of left middle cerebral artery: Secondary | ICD-10-CM

## 2019-09-01 DIAGNOSIS — R262 Difficulty in walking, not elsewhere classified: Secondary | ICD-10-CM

## 2019-09-01 DIAGNOSIS — R2689 Other abnormalities of gait and mobility: Secondary | ICD-10-CM

## 2019-09-01 DIAGNOSIS — M6281 Muscle weakness (generalized): Secondary | ICD-10-CM

## 2019-09-01 DIAGNOSIS — R278 Other lack of coordination: Secondary | ICD-10-CM

## 2019-09-01 DIAGNOSIS — I639 Cerebral infarction, unspecified: Secondary | ICD-10-CM

## 2019-09-01 NOTE — Therapy (Signed)
[facility] MAIN REHAB SERVICES [address] [place], [place], [address] Phone: [phone]   Fax:  [phone]  Physical Therapy Treatment and Discharge Note  Patient Details  Name: Courtney Chan MRN: [id number] Date of Birth: 06/03/1930 Referring Provider (PT): Linthavong, Kahhka   Encounter Date: 09/01/2019  PT End of Session - 09/01/19 1117    Visit Number  11    Authorization Type  10/10 PN 08/21/19    PT Start Time  1016    PT Stop Time  1050    PT Time Calculation (min)  34 min    Equipment Utilized During Treatment  Gait belt    Activity Tolerance  Patient tolerated treatment well    Behavior During Therapy  WFL for tasks assessed/performed       Past Medical History:  Diagnosis Date  . Atrial fibrillation (HCC) 2011  . Hyperlipidemia   . Hypertension   . Stroke (HCC)   . Thyroid disease     Past Surgical History:  Procedure Laterality Date  . ABDOMINAL HYSTERECTOMY    . BREAST BIOPSY Left 1980   Negative  . CORONARY/GRAFT ACUTE MI REVASCULARIZATION N/A 06/28/2019   Procedure: Coronary/Graft Acute MI Revascularization;  Surgeon: Harding, David W, MD;  Location: [facility] INVASIVE CV LAB;  Service: Cardiovascular;  Laterality: N/A;  . LEFT HEART CATH AND CORONARY ANGIOGRAPHY N/A 06/28/2019   Procedure: LEFT HEART CATH AND CORONARY ANGIOGRAPHY;  Surgeon: Harding, David W, MD;  Location: [facility] INVASIVE CV LAB;  Service: Cardiovascular;  Laterality: N/A;    There were no vitals filed for this visit.  Subjective Assessment - 09/01/19 1018    Subjective  Patient reported that she is doing well today, ready for discharge. Pt was able to walk ~30 minutes yesterday, no complaints    Pertinent History  Courtney Chan is a 86 y.o. right-handed female with history of atrial fibrillation followed by Dr.Paraschos at [facility], hyperlipidemia, hypertension. Patient lives with spouse independent prior to admission noted some recent  cognitive decline.  Presented 06/28/2019 with chest pain at [facility].   She is found to have non-STEMI with PCI completed per cardiology services.  On 06/29/2019 after extubation altered mental status not following commands.  MRI showed small acute infarction.  Per report small acute infarct involving the posterior aspect of the left superior frontal gyrus.  She went to inpatient rehab for a week and now is back at her homoe.    How long can you sit comfortably?  unlimited    How long can you stand comfortably?  10    How long can you walk comfortably?  10    Currently in Pain?  No/denies       TREATMENT:   Access Code: [email or web address] URL: [email or web address] Date: 09/01/2019 Prepared by:    Exercises:  Walking: discussed daily walking program with pt, instructed in progressing program as able, verbalized understanding Heel rises with counter support 3x10 with BUE support  Standing Tandem Balance with Counter Support, 30sec hold ea LE x3 ea side Standing Tandem Balance with Counter Support: alternating which leg is in front, 3x10 head turns vertically, 3x10 head turns horizontally Standing Single Leg Stance with Counter Support 3 times ea side, 30second holds, SUE support Lunge with Counter Support x20 bilaterally, pt needed verbal and tactile cues to maintain form Standing Hip Abduction with Resistance at ankles with BUE support, GTB 3x10 ea side  Standing Hip Extension with Resistance at Ankles and bilateral UE support,   GTB 3x10 ea side  Pt response/clinical impression: Pt achieved almost all goals, BERG score carried forward at this time. Discussion with primary PT, pt, and pt family about pt's progress, POC, and goals, all in agreement about discharging the patient due to patient's excellent progress with PT. HEP created and performed with PT in preparation for patient to self manage condition at home, verbalized and demonstrated understanding. Pt and family aware to  call with any questions or to obtain new referral if any needs arise.     PT Education - 09/01/19 1036    Education Details  HEP, POC, exericse technique    Person(s) Educated  Patient    Methods  Explanation;Demonstration;Tactile cues;Verbal cues;Handout    Comprehension  Verbalized understanding;Returned demonstration;Verbal cues required;Tactile cues required       PT Short Term Goals - 08/18/19 1420      PT SHORT TERM GOAL #1   Title  Patient will be independent in home exercise program to improve strength/mobility for better functional independence with ADLs.    Baseline  4/6- walking daily    Time  4    Period  Weeks    Status  Achieved    Target Date  08/17/19      PT SHORT TERM GOAL #2   Title  Patient (> 60 years old) will complete five times sit to stand test in < 15 seconds indicating an increased LE strength and improved balance.    Time  4    Period  Weeks    Status  Achieved    Target Date  08/17/19        PT Long Term Goals - 08/18/19 1420      PT LONG TERM GOAL #1   Title  Patient will increase Berg Balance score by > 6 points to demonstrate decreased fall risk during functional activities.    Baseline  4/6: 49/56    Time  8    Period  Weeks    Status  Partially Met    Target Date  09/14/19      PT LONG TERM GOAL #2   Title  Patient will increase six minute walk test distance to >1000 for progression to community ambulator and improve gait ability    Baseline  08/18/19: 1175    Time  8    Period  Weeks    Status  Achieved    Target Date  09/14/19      PT LONG TERM GOAL #3   Title  Patient will increase 10 meter walk test to >1.0m/s as to improve gait speed for better community ambulation and to reduce fall risk.    Baseline  4/6: 1.2 m/s    Time  8    Period  Weeks    Status  Achieved    Target Date  09/14/19      PT LONG TERM GOAL #4   Title  Patient will reduce timed up and go to <11 seconds to reduce fall risk and demonstrate improved  transfer/gait ability.    Baseline  4/6: 9.14 sec    Time  8    Period  Weeks    Status  Achieved    Target Date  09/14/19            Plan - 09/01/19 1020    Clinical Impression Statement  Pt achieved almost all goals, BERG score carried forward at this time. Discussion with primary PT, pt, and pt family about pt's progress,   POC, and goals, all in agreement about discharging the patient due to patient's excellent progress with PT. HEP created and performed with PT in preparation for patient to self manage condition at home, verbalized and demonstrated understanding. Pt and family aware to call with any questions or to obtain new referral if any needs arise.    Personal Factors and Comorbidities  Age    Examination-Participation Restrictions  Cleaning;Driving    Stability/Clinical Decision Making  Stable/Uncomplicated    Rehab Potential  Good    PT Frequency  Other (comment)   discharge   PT Duration  8 weeks   discharge   PT Treatment/Interventions  Gait training;Therapeutic exercise;Balance training;Neuromuscular re-education;Therapeutic activities;Functional mobility training;Moist Heat;Patient/family education    PT Next Visit Plan  strengthening and balance training    PT Home Exercise Plan  [facility]    Consulted and Agree with Plan of Care  Patient;Family member/caregiver       Patient will benefit from skilled therapeutic intervention in order to improve the following deficits and impairments:  Abnormal gait, Difficulty walking, Decreased strength, Decreased balance, Pain, Decreased activity tolerance  Visit Diagnosis: Muscle weakness (generalized)  Other lack of coordination  Acute cerebral infarction (HCC)  Other abnormalities of gait and mobility  Difficulty in walking, not elsewhere classified  Left middle cerebral artery stroke [facility])     Problem List Patient Active Problem List   Diagnosis Date Noted  . Hypothyroidism   . Chronic kidney disease   .  Supplemental oxygen dependent   . 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 Debility 07/04/2019  . CHF (congestive heart failure) ([place])   . Atrial fibrillation ([place])   . Acute cerebral infarction ([place])   . Non-ST elevation (NSTEMI) myocardial infarction ([place])   . Non-ST elevation MI (NSTEMI) ([place]) 06/28/2019  . Cardiac arrest due to underlying cardiac condition ([place]) 06/28/2019  . Cardiac arrest ([place]) 06/28/2019  . Ventilator dependent ([place])   . PAD (peripheral artery disease) ([place]) 01/11/2019  . Acute cystitis without hematuria 12/31/2018  . Diarrhea 12/31/2018  . CKD (chronic kidney disease) stage 3, GFR 30-59 ml/min 05/27/2018  . History of TIA (transient ischemic attack) 05/27/2018  . Persistent atrial fibrillation ([place]) 08/25/2017  . Osteopenia of multiple sites 11/09/2016  . DNR (do not resuscitate) 10/31/2016  . Encounter for general adult medical examination without abnormal findings 05/02/2016  . Vaccine counseling 11/01/2015  . Borderline diabetes mellitus 11/03/2013  . Pure hypercholesterolemia 11/03/2013  . Atrial fibrillation with RVR ([place]) 09/14/2013  . Acquired hypothyroidism 09/14/2013  . Diverticulosis 09/14/2013  . Essential hypertension 09/14/2013  . GERD without esophagitis 09/14/2013  . Mitral valve prolapse 09/14/2013  . H/O cardiac catheterization 08/12/2008    Lieutenant Diego PT, DPT 11:18 AM,09/01/19   [place] MAIN [facility] SERVICES [address] [place], [place], [address] Phone: [phone]   Fax:  [phone]  Name: Courtney Chan MRN: [id number] Date of Birth: December 15, 1930

## 2019-09-01 NOTE — Therapy (Signed)
[place] MAIN [facility] SERVICES [address] [place], [place], [address] Phone: [phone]   Fax:  [phone]  Occupational Therapy Treatment/Discharge  Patient Details  Name: Courtney Chan MRN: [id number] Date of Birth: June 05, 1930 No data recorded  Encounter Date: 09/01/2019  OT End of Session - 09/01/19 0947    Visit Number  9    Number of Visits  16    Date for OT Re-Evaluation  09/15/19    OT Start Time  0933    OT Stop Time  1015    OT Time Calculation (min)  42 min    Activity Tolerance  Patient tolerated treatment well    Behavior During Therapy  [facility] for tasks assessed/performed       Past Medical History:  Diagnosis Date  . Atrial fibrillation ([place]) 2011  . Hyperlipidemia   . Hypertension   . Stroke ([place])   . Thyroid disease     Past Surgical History:  Procedure Laterality Date  . ABDOMINAL HYSTERECTOMY    . BREAST BIOPSY Left 1980   Negative  . CORONARY/GRAFT ACUTE MI REVASCULARIZATION N/A 06/28/2019   Procedure: Coronary/Graft Acute MI Revascularization;  Surgeon: Leonie Man, MD;  Location: [place] CV LAB;  Service: Cardiovascular;  Laterality: N/A;  . LEFT HEART CATH AND CORONARY ANGIOGRAPHY N/A 06/28/2019   Procedure: LEFT HEART CATH AND CORONARY ANGIOGRAPHY;  Surgeon: Leonie Man, MD;  Location: [place] CV LAB;  Service: Cardiovascular;  Laterality: N/A;    There were no vitals filed for this visit.  Subjective Assessment - 09/01/19 0947    Subjective   Patient reports that her chest pain has improved.    Patient is accompanied by:  Family member    Pertinent History  Patient was in [place] for 2 weeks with one of those weeks for rehab.No home health.  Came home a week ago last Sunday.    Patient Stated Goals  Patient wants to get stronger and back to normal, be more independent.    Currently in Pain?  No/denies         [facility] OT Assessment - 09/01/19 0949      Coordination   Right  9 Hole Peg Test  23    Left 9 Hole Peg Test  32      Strength   Overall Strength Comments  RUE strength: 4/5, LUE strength: 4-/5      Hand Function   Right Hand Grip (lbs)  26    Right Hand Lateral Pinch  12 lbs    Right Hand 3 Point Pinch  10 lbs    Left Hand Grip (lbs)  29    Left Hand Lateral Pinch  11 lbs    Left 3 point pinch  12 lbs       OT TREATMENT   Neuro muscular re-education:  Pt. performed [place] tasks using the grooved pegboard. Pt. worked on grasping the grooved pegs from a horizontal position, and moving the pegs to a vertical position in the hand to prepare for placing them in the grooved slot. Pt. worked on translatory movements with the left hand, as well as using his hand for storage. Pt. required verbal, and visual cues.   Therapeutic Exercise:  Pt. worked on the [employer] for 5 min. with the workload set at 2.5# with constant monitoring of the BUEs. Pt. worked on changing, and alternating forward reverse position every 2 min. rest breaks were required.  Response to  Treatment:  Pt. has made excellent progress overall,  and has progressed with picking up more objects at home. Pt. has met goals established at the initial eval. Pt. is now able to perform more ADLs, and IADL tasks at home.  Pt. Is now able to perform bathing, and dressing skills. Pt. has improved with shower transfers, and is able to engage in home management tasks, and has started doing more yard work. UE strength, grip strength, and [place] skills have improved, and pt. Is independent with HEPs. Pt. Is appropriate now for discharge from OT services at this time.                   OT Education - 09/01/19 0947    Education Details  Ther. Ex    Person(s) Educated  Patient    Methods  Explanation;Demonstration    Comprehension  Verbalized understanding;Returned demonstration          OT Long Term Goals - 09/01/19 1000      OT LONG TERM GOAL #1   Title  Patient will be  independent with home exercise program for strengthening and coordination.    Baseline  Pt. is independent with theraputty HEP.    Time  8    Period  Weeks    Status  Achieved      OT LONG TERM GOAL #2   Title  Patient will complete upper body and lower body bathing with modified independence.    Baseline  Pt. is supervion when standing in the shower. Pt. is independently performing UE, and LE bathing.    Time  8    Period  Weeks    Status  Partially Met      OT LONG TERM GOAL #3   Title  Patient to perform UB and LB dressing skills with modified independence.    Baseline  Independent. Pt.'s husband occ. assists, however pt. is able to complete independently.    Time  8    Period  Weeks    Status  Achieved      OT LONG TERM GOAL #4   Title  Patient will complete bed making with modified independence.    Baseline  Independent with making the bed.    Time  4    Period  Weeks    Status  Achieved      OT LONG TERM GOAL #5   Title  Patient will complete light meal preparation with modified independence    Baseline  Independent with fixing spaghetti, and salad.    Time  8    Period  Weeks    Status  Achieved      OT LONG TERM GOAL #6   Title  Patient will increase strength in LUE by 1 mm grade to perform homemaking tasks with modified independence.    Baseline  Improved  LUE strength    Time  8    Period  Weeks    Status  Achieved            Plan - 09/01/19 0948    Clinical Impression Statement Pt. has made excellent progress overall,  and has progressed with picking up more objects at home. Pt. has met goals established at the initial eval. Pt. is now able to perform more ADLs, and IADL tasks at home.  Pt. Is now able to perform bathing, and dressing skills. Pt. has improved with shower transfers, and is able to engage in home management tasks, and has started  doing more yard work. UE strength, grip strength, and [place] skills have improved, and pt. Is independent with HEPs.  Pt. Is appropriate now for discharge from OT services at this time.   OT Occupational Profile and History  Detailed Assessment- Review of Records and additional review of physical, cognitive, psychosocial history related to current functional performance    Occupational performance deficits (Please refer to evaluation for details):  ADL's;IADL's;Leisure    Body Structure / Function / Physical Skills  ADL;Coordination;Endurance;GMC;UE functional use;Balance;Decreased knowledge of use of DME;Flexibility;IADL;Pain;Dexterity;FMC;Strength;Proprioception;Mobility;ROM    Psychosocial Skills  Environmental  Adaptations;Routines and Behaviors;Habits    Rehab Potential  Good    Clinical Decision Making  Limited treatment options, no task modification necessary    Comorbidities Affecting Occupational Performance:  May have comorbidities impacting occupational performance    Modification or Assistance to Complete Evaluation   No modification of tasks or assist necessary to complete eval    OT Frequency  2x / week    OT Duration  8 weeks    OT Treatment/Interventions  Self-care/ADL training;Cryotherapy;Therapeutic exercise;DME and/or AE instruction;Functional Mobility Training;Balance training;Neuromuscular education;Manual Therapy;Moist Heat;Contrast Bath;Energy conservation;Passive range of motion;Therapeutic activities;Patient/family education    Consulted and Agree with Plan of Care  Patient    Family Member Consulted  daughter       Patient will benefit from skilled therapeutic intervention in order to improve the following deficits and impairments:   Body Structure / Function / Physical Skills: ADL, Coordination, Endurance, GMC, UE functional use, Balance, Decreased knowledge of use of DME, Flexibility, IADL, Pain, Dexterity, FMC, Strength, Proprioception, Mobility, ROM   Psychosocial Skills: Environmental  Adaptations, Routines and Behaviors, Habits   Visit Diagnosis: Muscle weakness  (generalized)  Other lack of coordination    Problem List Patient Active Problem List   Diagnosis Date Noted  . Hypothyroidism   . Chronic kidney disease   . Supplemental oxygen dependent   . 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 Debility 07/04/2019  . CHF (congestive heart failure) ([place])   . Atrial fibrillation ([place])   . Acute cerebral infarction ([place])   . Non-ST elevation (NSTEMI) myocardial infarction ([place])   . Non-ST elevation MI (NSTEMI) ([place]) 06/28/2019  . Cardiac arrest due to underlying cardiac condition ([place]) 06/28/2019  . Cardiac arrest ([place]) 06/28/2019  . Ventilator dependent ([place])   . PAD (peripheral artery disease) ([place]) 01/11/2019  . Acute cystitis without hematuria 12/31/2018  . Diarrhea 12/31/2018  . CKD (chronic kidney disease) stage 3, GFR 30-59 ml/min 05/27/2018  . History of TIA (transient ischemic attack) 05/27/2018  . Persistent atrial fibrillation ([place]) 08/25/2017  . Osteopenia of multiple sites 11/09/2016  . DNR (do not resuscitate) 10/31/2016  . Encounter for general adult medical examination without abnormal findings 05/02/2016  . Vaccine counseling 11/01/2015  . Borderline diabetes mellitus 11/03/2013  . Pure hypercholesterolemia 11/03/2013  . Atrial fibrillation with RVR ([place]) 09/14/2013  . Acquired hypothyroidism 09/14/2013  . Diverticulosis 09/14/2013  . Essential hypertension 09/14/2013  . GERD without esophagitis 09/14/2013  . Mitral valve prolapse 09/14/2013  . H/O cardiac catheterization 08/12/2008    Harrel Carina, MS, OTR/L 09/01/2019, 3:02 PM  [place] MAIN [facility] SERVICES [address] [place], [place], [address] Phone: [phone]   Fax:  [phone]  Name: Courtney Chan MRN: [id number] Date of Birth: [birth year]

## 2019-09-04 ENCOUNTER — Encounter: Payer: Medicare PPO | Admitting: Occupational Therapy

## 2019-09-04 ENCOUNTER — Ambulatory Visit: Payer: Medicare PPO

## 2019-09-08 ENCOUNTER — Ambulatory Visit: Payer: Medicare PPO | Admitting: Physical Therapy

## 2019-09-08 ENCOUNTER — Ambulatory Visit: Payer: Medicare PPO | Admitting: Occupational Therapy

## 2019-09-11 ENCOUNTER — Ambulatory Visit: Payer: Medicare PPO

## 2019-09-11 ENCOUNTER — Encounter: Payer: Medicare PPO | Admitting: Occupational Therapy

## 2019-09-15 ENCOUNTER — Encounter: Payer: Medicare PPO | Admitting: Occupational Therapy

## 2019-09-15 ENCOUNTER — Ambulatory Visit: Payer: Medicare PPO | Admitting: Physical Therapy

## 2019-09-17 ENCOUNTER — Encounter: Payer: Medicare PPO | Admitting: Occupational Therapy

## 2019-09-17 ENCOUNTER — Ambulatory Visit: Payer: Medicare PPO | Admitting: Physical Therapy

## 2019-09-22 ENCOUNTER — Ambulatory Visit: Payer: Medicare PPO | Admitting: Physical Therapy

## 2019-09-22 ENCOUNTER — Encounter: Payer: Medicare PPO | Admitting: Occupational Therapy

## 2019-09-24 ENCOUNTER — Ambulatory Visit: Payer: Medicare PPO | Admitting: Physical Therapy

## 2019-09-29 ENCOUNTER — Encounter: Payer: Medicare PPO | Admitting: Occupational Therapy

## 2019-09-29 ENCOUNTER — Ambulatory Visit: Payer: Medicare PPO | Admitting: Physical Therapy

## 2019-10-01 ENCOUNTER — Ambulatory Visit: Payer: Medicare PPO | Admitting: Physical Therapy

## 2019-10-01 ENCOUNTER — Encounter: Payer: Medicare PPO | Admitting: Occupational Therapy

## 2019-10-06 ENCOUNTER — Ambulatory Visit: Payer: Medicare PPO | Admitting: Physical Therapy

## 2019-10-06 ENCOUNTER — Encounter: Payer: Medicare PPO | Admitting: Occupational Therapy

## 2019-10-08 ENCOUNTER — Ambulatory Visit: Payer: Medicare PPO | Admitting: Physical Therapy

## 2019-10-20 ENCOUNTER — Telehealth: Payer: Self-pay | Admitting: Neurology

## 2019-10-20 ENCOUNTER — Other Ambulatory Visit: Payer: Self-pay | Admitting: Infectious Diseases

## 2019-10-20 ENCOUNTER — Other Ambulatory Visit: Payer: Self-pay

## 2019-10-20 ENCOUNTER — Ambulatory Visit
Admission: RE | Admit: 2019-10-20 | Discharge: 2019-10-20 | Disposition: A | Discharge status: Home / Self Care | Payer: Medicare PPO | Source: Ambulatory Visit | Attending: Infectious Diseases | Admitting: Infectious Diseases

## 2019-10-20 DIAGNOSIS — H02401 Unspecified ptosis of right eyelid: Secondary | ICD-10-CM | POA: Diagnosis present

## 2019-10-20 DIAGNOSIS — Z8673 Personal history of transient ischemic attack (TIA), and cerebral infarction without residual deficits: Secondary | ICD-10-CM

## 2019-10-20 NOTE — Telephone Encounter (Signed)
IF patients daughter Darl Pikes calls back please cancel appt with Shanda Bumps NP.This is a new issue for pt and needs to be seen by Dr.Sethi. I left voice mail that appt on June 10 will have to be cancel.Vena Rua schedule with Dr Pearlean Brownie next week.  The above message was left by me on Darl Pikes voice mail.

## 2019-10-20 NOTE — Telephone Encounter (Signed)
This is a new symptom and concern and should be addressed by MD. Thank you

## 2019-10-20 NOTE — Telephone Encounter (Addendum)
If daughter calls back see phone note if Ct scan is normal she can see Shanda Bumps NP if ts abnormal schedule her with Dr.SEthi. I explain Dr Pearlean Brownie works in the hospital every other week.   I called Darl Pikes that pts appt with Shanda Bumps NP will have to be cancel and reschedule with Dr. Pearlean Brownie. The daughter stated her mom droopy right eyelid started being noticeable in the last week. I stated appt with Shanda Bumps NP will be cancel because NP cannot see new issues that pt have. Darl Pikes pts daughter stated pt got a sooner appt with PCP and is having CT scan today that was order by PCP. I tried to schedule appt with Dr. Pearlean Brownie but Darl Pikes wanted to wait to r/s. She stated if its normal she will call back and schedule with JEssica NP for the follow up. I advise her to schedule with Shanda Bumps NP between August and September. She verbalized understanding.

## 2019-10-20 NOTE — Telephone Encounter (Signed)
Revised. 

## 2019-10-20 NOTE — Telephone Encounter (Signed)
Cook, Science writer) called to report pt now has droopy eyelid and she is wanting her to be seen earlier. Pt has now been scheduled to see Jessica,NP on 06-10 with a 12:15 check in

## 2019-10-22 ENCOUNTER — Ambulatory Visit: Payer: Medicare PPO | Admitting: Adult Health

## 2019-10-26 NOTE — Telephone Encounter (Signed)
Pt's daughter Darl Pikes called needing to speak to RN. She states that the pt's eye that was drooping has improved and would like to discuss with RN if the pt is even needing to be seen. Please advise.

## 2019-10-27 NOTE — Telephone Encounter (Signed)
Need to be seen for new problem

## 2019-10-27 NOTE — Telephone Encounter (Signed)
I called Courtney Chan and schedule her mom for 617/2021 at 0830.Marland Kitchen check in time at 0800am.

## 2019-10-29 ENCOUNTER — Other Ambulatory Visit: Payer: Self-pay

## 2019-10-29 ENCOUNTER — Ambulatory Visit: Payer: Medicare PPO | Admitting: Neurology

## 2019-10-29 VITALS — BP 109/62 | HR 80 | Wt 149.6 lb

## 2019-10-29 DIAGNOSIS — H02401 Unspecified ptosis of right eyelid: Secondary | ICD-10-CM | POA: Diagnosis not present

## 2019-10-29 NOTE — Progress Notes (Signed)
[facility] [address] [place]. [place] [address] [phone]       OFFICE FOLLOW-UP NOTE  Courtney Chan Date of Birth:  12-28-1930 Medical Record Number:  [id number]   HPI: Courtney Chan is a 86 year old lady seen today for initial office follow-up visit following hospital consultation for stroke in February 2021.  History is obtained from the patient, review of electronic medical records and I personally reviewed imaging films in PACS.  She has past medical history of atrial fibrillation, hyperlipidemia, chronic kidney disease who was admitted with chest pain followed by ventricular fibrillation cardiac arrest in the emergency room.  She required CPR for approximately 5 minutes with return of spontaneous circulation.  She has subsequently found to have NSTEMI and had PCI and was placed on IV heparin after that.  After she was extubated she did well initially but then subsequently developed altered mental status and was found to be unresponsive with eyes closed not following commands and nonverbal.  Heparin was discontinued and CT scan was obtained which showed no hemorrhage and CT angiogram showed no emergent finding of LVO or large vessels stenosis or occlusion.  She recovered quickly back to baseline.  MRI scan showed a small acute infarct in the left frontal gyrus with area of encephalomalacia and gliosis in the posterior right insula and right temporal lobe.  2D echo showed normal ejection fraction.  LDL cholesterol was 61 mg percent and hemoglobin A1c was 6.1.  She was initially placed on IV heparin and subsequently changed to Pradaxa at discharge.  Patient states she is done well since discharge.  She said no stroke or TIA symptoms.  She is tolerating Pradaxa well without bruising or bleeding.  Blood pressure continues to be on the low side and today it is 93/60.  She is remains on Lipitor which is tolerating well without muscle aches and pains.  She states she did have a  remote history of possible stroke 6 years ago when she had episode of sudden onset of dizziness and fell.  She saw her primary care physician at [facility] but was not admitted and did not see a neurologist.  MRI scan of the brain on 08/08/2011 showed a punctate acute nonhemorrhagic right insular cortex infarct. Update 10/29/2019 : She returns for follow-up after last visit 2 months ago.  Patient states that 2 weeks ago she had noted drooping of the right eyelid it was moderate and not completely covering the eye.  She denied associated symptoms in the form of headache, double vision, blurred vision or eye pain.  There was some redness noted in the right eye in the lateral conjunctiva.  Symptoms have resolved a week ago.  There is no diurnal fluctuation in her drooping.  She denied any symptoms of excessive fatigue or tiredness or trouble getting out of a chair or walking.  She has had no recurrent stroke or TIA symptoms.  She remains on Pradaxa and Plavix she is tolerating well without any obvious bleeding or significant bruising.  She states her blood pressure under good control.  She is tolerating Lipitor well without any side effects.  She had CT scan of the head done on 10/20/2019 which I personally reviewed shows no acute abnormalities.  Previously she had CT angiogram of brain done on 06/29/2019 which had shown no evidence of aneurysm. ROS:   14 system review of systems is positive for drooping of right eye only today and all systems negative  PMH:  Past  Medical History:  Diagnosis Date  . Atrial fibrillation (HCC) 2011  . Hyperlipidemia   . Hypertension   . Stroke (HCC)   . Thyroid disease     Social History:  Social History   Socioeconomic History  . Marital status: Married    Spouse name: Not on file  . Number of children: Not on file  . Years of education: Not on file  . Highest education level: Not on file  Occupational History  . Not on file  Tobacco Use  . Smoking status: Never  Smoker  . Smokeless tobacco: Never Used  Substance and Sexual Activity  . Alcohol use: No  . Drug use: No  . Sexual activity: Not on file  Other Topics Concern  . Not on file  Social History Narrative  . Not on file   Social Determinants of Health   Financial Resource Strain:   . Difficulty of Paying Living Expenses:   Food Insecurity:   . Worried About Programme researcher, broadcasting/film/video in the Last Year:   . Barista in the Last Year:   Transportation Needs:   . Freight forwarder (Medical):   Marland Kitchen Lack of Transportation (Non-Medical):   Physical Activity:   . Days of Exercise per Week:   . Minutes of Exercise per Session:   Stress:   . Feeling of Stress :   Social Connections:   . Frequency of Communication with Friends and Family:   . Frequency of Social Gatherings with Friends and Family:   . Attends Religious Services:   . Active Member of Clubs or Organizations:   . Attends Banker Meetings:   Marland Kitchen Marital Status:   Intimate Partner Violence:   . Fear of Current or Ex-Partner:   . Emotionally Abused:   Marland Kitchen Physically Abused:   . Sexually Abused:     Medications:   Current Outpatient Medications on File Prior to Visit  Medication Sig Dispense Refill  . alendronate (FOSAMAX) 70 MG tablet Take 70 mg by mouth once a week. Take with a full glass of water on an empty stomach.    Marland Kitchen alendronate (FOSAMAX) 70 MG tablet Take by mouth.    Marland Kitchen amoxicillin (AMOXIL) 500 MG capsule Take 4 capsules by mouth as needed. Takes pre dental work    . atorvastatin (LIPITOR) 80 MG tablet Take 1 tablet (80 mg total) by mouth daily at 6 PM. 30 tablet 0  . clopidogrel (PLAVIX) 75 MG tablet Take 1 tablet (75 mg total) by mouth daily. 30 tablet 1  . clopidogrel (PLAVIX) 75 MG tablet Take by mouth.    Marland Kitchen LANOXIN 62.5 MCG TABS     . levothyroxine (SYNTHROID) 88 MCG tablet Take 1 tablet (88 mcg total) by mouth daily. 30 tablet 1  . losartan (COZAAR) 25 MG tablet Take 0.5 tablets (12.5 mg total)  by mouth daily. 30 tablet 1  . metoprolol succinate (TOPROL-XL) 50 MG 24 hr tablet Take 3 tablets (150 mg total) by mouth daily. Take with or immediately following a meal. 30 tablet 1  . omeprazole (PRILOSEC) 20 MG capsule 20 mg.      No current facility-administered medications on file prior to visit.    Allergies:   Allergies  Allergen Reactions  . Metronidazole Diarrhea and Nausea And Vomiting  . Oxycodone Other (See Comments)    Altered mental status    Physical Exam General: Pleasant elderly lady, seated, in no evident distress Head: head normocephalic and atraumatic.  Neck: supple with no carotid or supraclavicular bruits Cardiovascular: regular rate and rhythm, no murmurs Musculoskeletal: Mild kyphoscoliosis Skin:  no rash/petichiae Vascular:  Normal pulses all extremities Vitals:   10/29/19 0840  BP: 109/62  Pulse: 80   Neurologic Exam Mental Status: Awake and fully alert. Oriented to place and time. Recent and remote memory intact. Attention span, concentration and fund of knowledge appropriate. Mood and affect appropriate.  Cranial Nerves: Fundoscopic exam reveals sharp disc margins. Pupils equal, briskly reactive to light. Extraocular movements full without nystagmus.  Mild esotropia and hyper tropia of the left no significant ptosis at rest but with sustained upgaze there is slight drooping of the left eye greater than right without any diplopia.  Eye visual fields full to confrontation. Hearing diminished bilaterally. Facial sensation intact. Face, tongue, palate moves normally and symmetrically.  Motor: Normal bulk and tone. Normal strength in all tested extremity muscles. Sensory.: intact to touch ,pinprick .position and vibratory sensation.  Coordination: Rapid alternating movements normal in all extremities. Finger-to-nose and heel-to-shin performed accurately bilaterally. Gait and Station: Arises from chair without difficulty. Stance is normal. Gait demonstrates  normal stride length and balance . Able to heel, toe and tandem walk with great difficulty.  Reflexes: 1+ and symmetric. Toes downgoing.       ASSESSMENT: 86 year old Caucasian lady with left frontal MCA branch infarct and February 2021 secondary to myocardial infarction, cardiac arrest and atrial fibrillation.  She is doing remarkably well with no physical deficits from her stroke.  Remote history of embolic right MCA branch infarct in March 2013.  New complaint of transient right eyelid drooping which has resolved and is of unclear etiology.  No associated symptoms to suggest myasthenia and structural brain imaging is negative.   PLAN: I had a long discussion with the patient and her daughter regarding her episode of transient right eye drooping which appears to have resolved and is of unclear origin.  She denied any accompanying symptoms to suggest myasthenia or stroke and CT scan showed no acute abnormality.  I recommend conservative follow-up for now if symptoms recur or new symptoms develop she was advised to call me.  Continue Pradaxa for stroke prevention for A. fib and maintain aggressive risk factor modification with strict control of hypertension with blood pressure goal below 130/90, lipids with LDL cholesterol goal below 70 mg percent and diabetes with hemoglobin A1c goal below 6.5%.  She will return for follow-up in the future scheduled 4 months or call earlier if necessary. Greater than 50% of time during this 30 minute visit was spent on counseling,explanation of diagnosis, planning of further management, discussion with patient and family and coordination of care Antony Contras, MD  [facility] [address] [place], [place] [address]  Phone [phone] Fax [phone] Note: This document was prepared with digital dictation and possible smart phrase technology. Any transcriptional errors that result from this process are unintentional

## 2019-10-29 NOTE — Patient Instructions (Signed)
I had a long discussion with the patient and her daughter regarding her episode of transient right eye drooping which appears to have resolved and is of unclear origin.  She denied any accompanying symptoms to suggest myasthenia or stroke and CT scan showed no acute abnormality.  I recommend conservative follow-up for now if symptoms recur or new symptoms develop she was advised to call me.  Continue Pradaxa for stroke prevention for A. fib and maintain aggressive risk factor modification with strict control of hypertension with blood pressure goal below 130/90, lipids with LDL cholesterol goal below 70 mg percent and diabetes with hemoglobin A1c goal below 6.5%.  She will return for follow-up in the future in 4 months or call earlier if necessary.

## 2019-11-06 ENCOUNTER — Emergency Department: Payer: Medicare PPO

## 2019-11-06 ENCOUNTER — Encounter: Payer: Self-pay | Admitting: Emergency Medicine

## 2019-11-06 ENCOUNTER — Other Ambulatory Visit: Payer: Self-pay

## 2019-11-06 ENCOUNTER — Emergency Department
Admission: EM | Admit: 2019-11-06 | Discharge: 2019-11-06 | Disposition: A | Discharge status: Home / Self Care | Payer: Medicare PPO | Attending: Emergency Medicine | Admitting: Emergency Medicine

## 2019-11-06 DIAGNOSIS — S50312A Abrasion of left elbow, initial encounter: Secondary | ICD-10-CM | POA: Insufficient documentation

## 2019-11-06 DIAGNOSIS — Y999 Unspecified external cause status: Secondary | ICD-10-CM | POA: Diagnosis not present

## 2019-11-06 DIAGNOSIS — W07XXXA Fall from chair, initial encounter: Secondary | ICD-10-CM | POA: Diagnosis not present

## 2019-11-06 DIAGNOSIS — I10 Essential (primary) hypertension: Secondary | ICD-10-CM | POA: Insufficient documentation

## 2019-11-06 DIAGNOSIS — S0502XA Injury of conjunctiva and corneal abrasion without foreign body, left eye, initial encounter: Secondary | ICD-10-CM | POA: Diagnosis present

## 2019-11-06 DIAGNOSIS — Y92009 Unspecified place in unspecified non-institutional (private) residence as the place of occurrence of the external cause: Secondary | ICD-10-CM | POA: Diagnosis not present

## 2019-11-06 DIAGNOSIS — R519 Headache, unspecified: Secondary | ICD-10-CM | POA: Insufficient documentation

## 2019-11-06 DIAGNOSIS — Y939 Activity, unspecified: Secondary | ICD-10-CM | POA: Diagnosis not present

## 2019-11-06 DIAGNOSIS — S80212A Abrasion, left knee, initial encounter: Secondary | ICD-10-CM | POA: Diagnosis not present

## 2019-11-06 DIAGNOSIS — Z79899 Other long term (current) drug therapy: Secondary | ICD-10-CM | POA: Diagnosis not present

## 2019-11-06 DIAGNOSIS — W19XXXA Unspecified fall, initial encounter: Secondary | ICD-10-CM

## 2019-11-06 NOTE — Discharge Instructions (Addendum)
Your head CT was negative for any internal bleeding.  Your face CT, neck CT, knee x-ray, elbow x-ray are negative for any fracture.  The swelling to your knee is likely due to a large bruise from your blood thinner.  Please wear the compression wrapping to your left knee to help decrease the bruising.  Use your walker to help you walk.  Please call primary care on Monday for a follow-up appointment.  Return to the emergency department for any change or worsening of symptoms.

## 2019-11-06 NOTE — ED Notes (Signed)
See triage note  Presents s/p trip and fell  States she tripped on chairs at [employer] down face first  Swelling and bruising noted to left knee,bruising noted to left eye  abrasion to left elbow  Stats she was able to drive home after the fall

## 2019-11-06 NOTE — ED Provider Notes (Signed)
[facility] Emergency Department Provider Note  ____________________________________________  Time seen: Approximately 4:46 PM  I have reviewed the triage vital signs and the nursing notes.   HISTORY  Chief Complaint Fall    HPI Courtney Chan is a 86 y.o. female that presents to emergency department for evaluation after a fall at [facility] today.  Patient states that she tripped over a cart at [facility].  She fell landing on her face and left knee.  Patient has bruising to her left cheek and her left knee.  She has a small abrasion under her left eye, left elbow, left knee.  She is on a blood thinner.  Patient denies feeling dizzy or any syncopal episode.  She did not lose consciousness.  She did walk to the car after her fall and drove herself home.  She also walked from the car into the emergency department.  No shortness breath, chest pain, abdominal pain, hip pain.   Past Medical History:  Diagnosis Date  . Atrial fibrillation (HCC) 2011  . Hyperlipidemia   . Hypertension   . Stroke (HCC)   . Thyroid disease     Patient Active Problem List   Diagnosis Date Noted  . Hypothyroidism   . Chronic kidney disease   . Supplemental oxygen dependent   . 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 Debility 07/04/2019  . CHF (congestive heart failure) (HCC)   . Atrial fibrillation (HCC)   . Acute cerebral infarction (HCC)   . Non-ST elevation (NSTEMI) myocardial infarction (HCC)   . Non-ST elevation MI (NSTEMI) (HCC) 06/28/2019  . Cardiac arrest due to underlying cardiac condition (HCC) 06/28/2019  . Cardiac arrest (HCC) 06/28/2019  . Ventilator dependent (HCC)   . PAD (peripheral artery disease) (HCC) 01/11/2019  . Acute cystitis without hematuria 12/31/2018  . Diarrhea 12/31/2018  . CKD (chronic kidney disease) stage 3, GFR 30-59 ml/min 05/27/2018  . History of TIA (transient ischemic attack) 05/27/2018  . Persistent atrial fibrillation (HCC)  08/25/2017  . Osteopenia of multiple sites 11/09/2016  . DNR (do not resuscitate) 10/31/2016  . Encounter for general adult medical examination without abnormal findings 05/02/2016  . Vaccine counseling 11/01/2015  . Borderline diabetes mellitus 11/03/2013  . Pure hypercholesterolemia 11/03/2013  . Atrial fibrillation with RVR (HCC) 09/14/2013  . Acquired hypothyroidism 09/14/2013  . Diverticulosis 09/14/2013  . Essential hypertension 09/14/2013  . GERD without esophagitis 09/14/2013  . Mitral valve prolapse 09/14/2013  . H/O cardiac catheterization 08/12/2008    Past Surgical History:  Procedure Laterality Date  . ABDOMINAL HYSTERECTOMY    . BREAST BIOPSY Left 1980   Negative  . CORONARY/GRAFT ACUTE MI REVASCULARIZATION N/A 06/28/2019   Procedure: Coronary/Graft Acute MI Revascularization;  Surgeon: Marykay Lex, MD;  Location: [facility] INVASIVE CV LAB;  Service: Cardiovascular;  Laterality: N/A;  . LEFT HEART CATH AND CORONARY ANGIOGRAPHY N/A 06/28/2019   Procedure: LEFT HEART CATH AND CORONARY ANGIOGRAPHY;  Surgeon: Marykay Lex, MD;  Location: [facility] INVASIVE CV LAB;  Service: Cardiovascular;  Laterality: N/A;    Prior to Admission medications   Medication Sig Start Date End Date Taking? Authorizing Provider  alendronate (FOSAMAX) 70 MG tablet Take 70 mg by mouth once a week. Take with a full glass of water on an empty stomach.    [provider]  alendronate (FOSAMAX) 70 MG tablet Take by mouth. 08/31/19   [provider]  amoxicillin (AMOXIL) 500 MG capsule Take 4 capsules by mouth as needed.  Takes pre dental work 08/24/19   [provider]  atorvastatin (LIPITOR) 80 MG tablet Take 1 tablet (80 mg total) by mouth daily at 6 PM. 07/10/19   Angiulli, Mcarthur Rossetti, PA-C  clopidogrel (PLAVIX) 75 MG tablet Take 1 tablet (75 mg total) by mouth daily. 07/10/19   Angiulli, Mcarthur Rossetti, PA-C  clopidogrel (PLAVIX) 75 MG tablet Take by mouth. 09/07/19   [provider]  LANOXIN 62.5 MCG TABS  09/14/19   [provider]  levothyroxine (SYNTHROID) 88 MCG tablet Take 1 tablet (88 mcg total) by mouth daily. 07/10/19   Angiulli, Mcarthur Rossetti, PA-C  losartan (COZAAR) 25 MG tablet Take 0.5 tablets (12.5 mg total) by mouth daily. 07/10/19   Angiulli, Mcarthur Rossetti, PA-C  metoprolol succinate (TOPROL-XL) 50 MG 24 hr tablet Take 3 tablets (150 mg total) by mouth daily. Take with or immediately following a meal. 07/11/19   Angiulli, Mcarthur Rossetti, PA-C  omeprazole (PRILOSEC) 20 MG capsule 20 mg.  08/27/19   [provider]    Allergies Metronidazole and Oxycodone  Family History  Problem Relation Age of Onset  . Breast cancer Maternal Grandmother        34's    Social History Social History   Tobacco Use  . Smoking status: Never Smoker  . Smokeless tobacco: Never Used  Substance Use Topics  . Alcohol use: No  . Drug use: No     Review of Systems  Cardiovascular: No chest pain. Respiratory: No SOB. Gastrointestinal: No abdominal pain.  No nausea, no vomiting.  Musculoskeletal: Positive for knee pain. Skin: Negative for rash, abrasions, lacerations. Positive for ecchymosis. Neurological: Negative for headaches, numbness or tingling   ____________________________________________   PHYSICAL EXAM:  VITAL SIGNS: ED Triage Vitals  Enc Vitals Group     BP 11/06/19 1548 138/82     Pulse Rate 11/06/19 1548 97     Resp 11/06/19 1548 20     Temp 11/06/19 1548 98.1 F (36.7 C)     Temp Source 11/06/19 1548 Oral     SpO2 11/06/19 1548 95 %     Weight 11/06/19 1546 150 lb (68 kg)     Height 11/06/19 1546 5\' 4"  (1.626 m)     Head Circumference --      Peak Flow --      Pain Score 11/06/19 1545 9     Pain Loc --      Pain Edu? --      Excl. in GC? --      Constitutional: Alert and oriented. Well appearing and in no acute distress. Eyes: Conjunctivae are normal. PERRL. EOMI. Head: Ecchymosis under left eye. ENT:      Ears:       Nose: No congestion/rhinnorhea.      Mouth/Throat: Mucous membranes are moist.  Neck: No stridor. No cervical spine tenderness to palpation. Cardiovascular: Normal rate, regular rhythm.  Good peripheral circulation. Respiratory: Normal respiratory effort without tachypnea or retractions. Lungs CTAB. Good air entry to the bases with no decreased or absent breath sounds. Gastrointestinal: Bowel sounds 4 quadrants. Soft and nontender to palpation. No guarding or rigidity. No palpable masses. No distention. Musculoskeletal: Full range of motion to all extremities. No gross deformities appreciated.  Full range of motion of left knee.  Ecchymosis to left knee.  Full range of motion of left elbow. Neurologic:  Normal speech and language. No gross focal neurologic deficits are appreciated.  Skin:  Skin is warm, dry and intact.  No rash noted. Psychiatric: Mood and affect are normal. Speech and behavior are normal. Patient exhibits appropriate insight and judgement.   ____________________________________________   LABS (all labs ordered are listed, but only abnormal results are displayed)  Labs Reviewed - No data to display ____________________________________________  EKG   ____________________________________________  RADIOLOGY Lexine BatonI, Venna Berberich, personally viewed and evaluated these images (plain radiographs) as part of my medical decision making, as well as reviewing the written report by the radiologist.  DG Elbow Complete Left  Result Date: 11/06/2019 CLINICAL DATA:  Status post fall. EXAM: LEFT ELBOW - COMPLETE 3+ VIEW COMPARISON:  None. FINDINGS: There is no evidence of fracture, dislocation, or joint effusion. There is no evidence of arthropathy or other focal bone abnormality. In ill-defined 6.0 cm x 3.9 cm lobulated soft tissue calcification is seen along the medial aspect of the proximal left forearm. IMPRESSION: 1. No acute fracture or dislocation. Electronically Signed   By:  Aram Candelahaddeus  Houston M.D.   On: 11/06/2019 17:36   CT Head Wo Contrast  Result Date: 11/06/2019 CLINICAL DATA:  86 year old female with facial trauma. EXAM: CT HEAD WITHOUT CONTRAST CT MAXILLOFACIAL WITHOUT CONTRAST CT CERVICAL SPINE WITHOUT CONTRAST TECHNIQUE: Multidetector CT imaging of the head, cervical spine, and maxillofacial structures were performed using the standard protocol without intravenous contrast. Multiplanar CT image reconstructions of the cervical spine and maxillofacial structures were also generated. COMPARISON:  Head CT dated 10/20/2019. FINDINGS: CT HEAD FINDINGS Brain: There is mild age-related atrophy and chronic microvascular ischemic changes. There is no acute intracranial hemorrhage. No mass effect or midline shift. No extra-axial fluid collection. Vascular: No hyperdense vessel or unexpected calcification. Skull: Normal. Negative for fracture or focal lesion. Other: Mild left facial contusion. CT MAXILLOFACIAL FINDINGS Osseous: No acute fracture. No mandibular subluxation Orbits: The globes and retro-orbital fat are preserved. Sinuses: Clear. Soft tissues: Small left periorbital contusion. CT CERVICAL SPINE FINDINGS Alignment: No acute subluxation. Skull base and vertebrae: No acute fracture. Osteopenia. Soft tissues and spinal canal: No prevertebral fluid or swelling. No visible canal hematoma. Disc levels:  No acute findings. Degenerative changes. Upper chest: Mild mosaic appearance, likely air trapping. Other: Bilateral carotid bulb calcified plaques. IMPRESSION: 1. No acute intracranial pathology. Mild age-related atrophy and chronic microvascular ischemic changes. 2. No acute/traumatic cervical spine pathology. 3. No acute facial bone fractures. Electronically Signed   By: Elgie CollardArash  Radparvar M.D.   On: 11/06/2019 17:17   CT Cervical Spine Wo Contrast  Result Date: 11/06/2019 CLINICAL DATA:  86 year old female with facial trauma. EXAM: CT HEAD WITHOUT CONTRAST CT MAXILLOFACIAL  WITHOUT CONTRAST CT CERVICAL SPINE WITHOUT CONTRAST TECHNIQUE: Multidetector CT imaging of the head, cervical spine, and maxillofacial structures were performed using the standard protocol without intravenous contrast. Multiplanar CT image reconstructions of the cervical spine and maxillofacial structures were also generated. COMPARISON:  Head CT dated 10/20/2019. FINDINGS: CT HEAD FINDINGS Brain: There is mild age-related atrophy and chronic microvascular ischemic changes. There is no acute intracranial hemorrhage. No mass effect or midline shift. No extra-axial fluid collection. Vascular: No hyperdense vessel or unexpected calcification. Skull: Normal. Negative for fracture or focal lesion. Other: Mild left facial contusion. CT MAXILLOFACIAL FINDINGS Osseous: No acute fracture. No mandibular subluxation Orbits: The globes and retro-orbital fat are preserved. Sinuses: Clear. Soft tissues: Small left periorbital contusion. CT CERVICAL SPINE FINDINGS Alignment: No acute subluxation. Skull base and vertebrae: No acute fracture. Osteopenia. Soft tissues and spinal canal: No prevertebral fluid or swelling. No visible canal hematoma. Disc levels:  No  acute findings. Degenerative changes. Upper chest: Mild mosaic appearance, likely air trapping. Other: Bilateral carotid bulb calcified plaques. IMPRESSION: 1. No acute intracranial pathology. Mild age-related atrophy and chronic microvascular ischemic changes. 2. No acute/traumatic cervical spine pathology. 3. No acute facial bone fractures. Electronically Signed   By: Anner Crete M.D.   On: 11/06/2019 17:17   DG Knee Complete 4 Views Left  Result Date: 11/06/2019 CLINICAL DATA:  Status post fall. EXAM: LEFT KNEE - COMPLETE 4+ VIEW COMPARISON:  None. FINDINGS: No evidence of an acute fracture, dislocation, or joint effusion. There is mild medial and lateral tibiofemoral compartment space narrowing. A mild amount of lateral chondrocalcinosis is seen. Marked severity  soft tissue swelling is seen anteriorly. There is mild vascular calcification. IMPRESSION: Marked severity anterior soft tissue swelling without evidence of acute fracture. Electronically Signed   By: Virgina Norfolk M.D.   On: 11/06/2019 17:35   CT Maxillofacial Wo Contrast  Result Date: 11/06/2019 CLINICAL DATA:  86 year old female with facial trauma. EXAM: CT HEAD WITHOUT CONTRAST CT MAXILLOFACIAL WITHOUT CONTRAST CT CERVICAL SPINE WITHOUT CONTRAST TECHNIQUE: Multidetector CT imaging of the head, cervical spine, and maxillofacial structures were performed using the standard protocol without intravenous contrast. Multiplanar CT image reconstructions of the cervical spine and maxillofacial structures were also generated. COMPARISON:  Head CT dated 10/20/2019. FINDINGS: CT HEAD FINDINGS Brain: There is mild age-related atrophy and chronic microvascular ischemic changes. There is no acute intracranial hemorrhage. No mass effect or midline shift. No extra-axial fluid collection. Vascular: No hyperdense vessel or unexpected calcification. Skull: Normal. Negative for fracture or focal lesion. Other: Mild left facial contusion. CT MAXILLOFACIAL FINDINGS Osseous: No acute fracture. No mandibular subluxation Orbits: The globes and retro-orbital fat are preserved. Sinuses: Clear. Soft tissues: Small left periorbital contusion. CT CERVICAL SPINE FINDINGS Alignment: No acute subluxation. Skull base and vertebrae: No acute fracture. Osteopenia. Soft tissues and spinal canal: No prevertebral fluid or swelling. No visible canal hematoma. Disc levels:  No acute findings. Degenerative changes. Upper chest: Mild mosaic appearance, likely air trapping. Other: Bilateral carotid bulb calcified plaques. IMPRESSION: 1. No acute intracranial pathology. Mild age-related atrophy and chronic microvascular ischemic changes. 2. No acute/traumatic cervical spine pathology. 3. No acute facial bone fractures. Electronically Signed   By:  Anner Crete M.D.   On: 11/06/2019 17:17    ____________________________________________    PROCEDURES  Procedure(s) performed:    Procedures    Medications - No data to display   ____________________________________________   INITIAL IMPRESSION / ASSESSMENT AND PLAN / ED COURSE  Pertinent labs & imaging results that were available during my care of the patient were reviewed by me and considered in my medical decision making (see chart for details).  Review of the [place] CSRS was performed in accordance of the [place] prior to dispensing any controlled drugs.     Patient presented emergency department for evaluation after [facility].  Vital signs and exam are reassuring.  Head CT, cervical CT, maxillofacial CT are negative for acute abnormalities.  Knee x-ray shows soft tissue swelling without underlying fracture.  Elbow x-ray is negative for acute bony abnormalities.  Jones wrap was placed to left knee.  Patient has a walker at home that she will use.  Patient is up walking in the emergency department without assistance.  We discussed whether patient will need assistance at home or placement due to her knee pain and swelling.  Patient states that she does not require any assistance and  would like to be discharged home.  She has a trip planned to [facility] on Sunday with her children that she is excited for and will be attending.  [name] is with her in the emergency department and is agreeable with plan.  Patient is to follow up with primary care as directed. Patient is given ED precautions to return to the ED for any worsening or new symptoms.  Courtney Chan was evaluated in Emergency Department on 11/06/2019 for the symptoms described in the history of present illness. She was evaluated in the context of the global COVID-19 pandemic, which necessitated consideration that the patient might be at risk for infection with the SARS-CoV-2 virus that  causes COVID-19. Institutional protocols and algorithms that pertain to the evaluation of patients at risk for COVID-19 are in a state of rapid change based on information released by regulatory bodies including the CDC and federal and state organizations. These policies and algorithms were followed during the patient's care in the ED.   ____________________________________________  FINAL CLINICAL IMPRESSION(S) / ED DIAGNOSES  Final diagnoses:  Fall, initial encounter      NEW MEDICATIONS STARTED DURING THIS VISIT:  ED Discharge Orders    None          This chart was dictated using voice recognition software/Dragon. Despite best efforts to proofread, errors can occur which can change the meaning. Any change was purely unintentional.    Enid Derry, PA-C 11/06/19 1832    Chesley Noon, MD 11/06/19 2036

## 2019-11-06 NOTE — ED Triage Notes (Signed)
First Rn Note: pt presents to ED via wheelchair from [facility] with c/o mechanical fall [facility]. Pt reports hitting her face, c/o pain to L cheek and L knee upon arrival to ED. Pt denies LOC at this time. Pt A&O x4 upon arrival to ED.

## 2019-11-10 ENCOUNTER — Ambulatory Visit: Payer: Self-pay | Admitting: Neurology

## 2020-01-11 ENCOUNTER — Other Ambulatory Visit
Admission: RE | Admit: 2020-01-11 | Discharge: 2020-01-11 | Disposition: A | Discharge status: Home / Self Care | Payer: Medicare PPO | Source: Ambulatory Visit | Attending: Physician Assistant | Admitting: Physician Assistant

## 2020-01-11 DIAGNOSIS — I480 Paroxysmal atrial fibrillation: Secondary | ICD-10-CM | POA: Insufficient documentation

## 2020-01-11 DIAGNOSIS — Z79899 Other long term (current) drug therapy: Secondary | ICD-10-CM | POA: Diagnosis not present

## 2020-01-11 LAB — DIGOXIN LEVEL: Digoxin Level: <0.2 ng/mL — ABNORMAL LOW (ref 0.8–2.0)

## 2020-02-22 ENCOUNTER — Ambulatory Visit: Payer: Medicare PPO | Admitting: Adult Health

## 2020-02-26 ENCOUNTER — Ambulatory Visit: Payer: Medicare Other | Admitting: Urology

## 2020-04-04 ENCOUNTER — Other Ambulatory Visit: Payer: Self-pay

## 2020-04-04 ENCOUNTER — Emergency Department
Admission: EM | Admit: 2020-04-04 | Discharge: 2020-04-04 | Disposition: A | Discharge status: Home / Self Care | Payer: Medicare PPO | Attending: Emergency Medicine | Admitting: Emergency Medicine

## 2020-04-04 ENCOUNTER — Emergency Department: Payer: Medicare PPO

## 2020-04-04 ENCOUNTER — Encounter: Payer: Self-pay | Admitting: Emergency Medicine

## 2020-04-04 DIAGNOSIS — K219 Gastro-esophageal reflux disease without esophagitis: Secondary | ICD-10-CM | POA: Diagnosis not present

## 2020-04-04 DIAGNOSIS — N183 Chronic kidney disease, stage 3 unspecified: Secondary | ICD-10-CM | POA: Insufficient documentation

## 2020-04-04 DIAGNOSIS — I130 Hypertensive heart and chronic kidney disease with heart failure and stage 1 through stage 4 chronic kidney disease, or unspecified chronic kidney disease: Secondary | ICD-10-CM | POA: Diagnosis not present

## 2020-04-04 DIAGNOSIS — Z955 Presence of coronary angioplasty implant and graft: Secondary | ICD-10-CM | POA: Insufficient documentation

## 2020-04-04 DIAGNOSIS — E039 Hypothyroidism, unspecified: Secondary | ICD-10-CM | POA: Diagnosis not present

## 2020-04-04 DIAGNOSIS — Z8673 Personal history of transient ischemic attack (TIA), and cerebral infarction without residual deficits: Secondary | ICD-10-CM | POA: Insufficient documentation

## 2020-04-04 DIAGNOSIS — I509 Heart failure, unspecified: Secondary | ICD-10-CM | POA: Diagnosis not present

## 2020-04-04 DIAGNOSIS — Z79899 Other long term (current) drug therapy: Secondary | ICD-10-CM | POA: Insufficient documentation

## 2020-04-04 DIAGNOSIS — R1013 Epigastric pain: Secondary | ICD-10-CM | POA: Diagnosis present

## 2020-04-04 LAB — BASIC METABOLIC PANEL
Anion gap: 9 (ref 5–15)
BUN: 17 mg/dL (ref 8–23)
CO2: 28 mmol/L (ref 22–32)
Calcium: 9.7 mg/dL (ref 8.9–10.3)
Chloride: 106 mmol/L (ref 98–111)
Creatinine, Ser: 1.1 mg/dL — ABNORMAL HIGH (ref 0.44–1.00)
GFR, Estimated: 49 mL/min — ABNORMAL LOW (ref >60)
Glucose, Bld: 121 mg/dL — ABNORMAL HIGH (ref 70–99)
Potassium: 4.1 mmol/L (ref 3.5–5.1)
Sodium: 143 mmol/L (ref 135–145)

## 2020-04-04 LAB — CBC
HCT: 39.8 % (ref 36.0–46.0)
Hemoglobin: 12.6 g/dL (ref 12.0–15.0)
MCH: 30.3 pg (ref 26.0–34.0)
MCHC: 31.7 g/dL (ref 30.0–36.0)
MCV: 95.7 fL (ref 80.0–100.0)
Platelets: 308 10*3/uL (ref 150–400)
RBC: 4.16 MIL/uL (ref 3.87–5.11)
RDW: 13.8 % (ref 11.5–15.5)
WBC: 7.1 10*3/uL (ref 4.0–10.5)
nRBC: 0 % (ref 0.0–0.2)

## 2020-04-04 LAB — TROPONIN I (HIGH SENSITIVITY)
Troponin I (High Sensitivity): 8 ng/L (ref <18)
Troponin I (High Sensitivity): 8 ng/L (ref <18)

## 2020-04-04 LAB — LIPASE, BLOOD: Lipase: 35 U/L (ref 11–51)

## 2020-04-04 MED ORDER — ALUM & MAG HYDROXIDE-SIMETH 200-200-20 MG/5ML PO SUSP
30.0000 mL | Freq: Once | ORAL | Status: AC
  Administered 2020-04-04: 30 mL via ORAL
  Filled 2020-04-04: qty 30

## 2020-04-04 MED ORDER — PANTOPRAZOLE SODIUM 40 MG PO TBEC: 40.0000 mg | DELAYED_RELEASE_TABLET | Freq: Every day | ORAL | 1 refills | Status: DC

## 2020-04-04 MED ORDER — LIDOCAINE VISCOUS HCL 2 % MT SOLN
15.0000 mL | Freq: Once | OROMUCOSAL | Status: AC
  Administered 2020-04-04: 15 mL via ORAL
  Filled 2020-04-04: qty 15

## 2020-04-04 MED ORDER — DICYCLOMINE HCL 10 MG/5ML PO SOLN
10.0000 mg | Freq: Once | ORAL | Status: AC
  Administered 2020-04-04: 10 mg via ORAL
  Filled 2020-04-04: qty 5

## 2020-04-04 NOTE — ED Triage Notes (Signed)
Pt via pov from home with "reflux" since yesterday morning. STates she has had this in the past, but this is worse. Pt denies any sob, n/v, sweating. Pt states her son told her to take apple cider vinegar for relief, which she did, but was not relieved. Pt alert & oriented, nad noted.

## 2020-04-04 NOTE — Discharge Instructions (Addendum)
I have given you a prescription for some Protonix.  If you are still taking the omeprazole I would just double up on the omeprazole take 2 pills a day and do not get the Protonix filled.  If you are not taking the omeprazole anymore get the Protonix filled and use that, 1 a day.  If you have a little more reflux you can also use Maalox with it.  If you get chest discomfort or tightness when you are walking or climbing the stairs please return here immediately.  Please get your regular doctor to follow-up with you.  I double checked your EKG.  There are no new changes there.  Also your troponins were stable.

## 2020-04-04 NOTE — ED Provider Notes (Signed)
[facility] Emergency Department Provider Note   ____________________________________________   First MD Initiated Contact with Patient 04/04/20 [phone]     (approximate)  I have reviewed the triage vital signs and the nursing notes.   HISTORY  Chief Complaint Gastroesophageal Reflux    HPI Courtney Chan is a 86 y.o. female who reports history of reflux in the past.  She had an episode yesterday and had to leave church early because it was hurting so much burning and achy pain in the epigastrium going up into the chest.  She reports she did not have any trouble sleeping did not seem to go up into her chest more when she was lying down.  It went away and then came back about 5:00 this morning.  She is having some now with minimal epigastric tenderness.  The pain is mild to moderate nothing she does makes it better or worse.  She has not eaten since it started.  She was going to go and get some Prilosec from this grocery store but has not done that yet.         Past Medical History:  Diagnosis Date  . Atrial fibrillation (HCC) 2011  . Hyperlipidemia   . Hypertension   . Stroke (HCC)   . Thyroid disease     Patient Active Problem List   Diagnosis Date Noted  . Hypothyroidism   . Chronic kidney disease   . Supplemental oxygen dependent   . 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 Debility 07/04/2019  . CHF (congestive heart failure) (HCC)   . Atrial fibrillation (HCC)   . Acute cerebral infarction (HCC)   . Non-ST elevation (NSTEMI) myocardial infarction (HCC)   . Non-ST elevation MI (NSTEMI) (HCC) 06/28/2019  . Cardiac arrest due to underlying cardiac condition (HCC) 06/28/2019  . Cardiac arrest (HCC) 06/28/2019  . Ventilator dependent (HCC)   . PAD (peripheral artery disease) (HCC) 01/11/2019  . Acute cystitis without hematuria 12/31/2018  . Diarrhea 12/31/2018  . CKD (chronic kidney disease) stage 3, GFR 30-59 ml/min (HCC)  05/27/2018  . History of TIA (transient ischemic attack) 05/27/2018  . Persistent atrial fibrillation (HCC) 08/25/2017  . Osteopenia of multiple sites 11/09/2016  . DNR (do not resuscitate) 10/31/2016  . Encounter for general adult medical examination without abnormal findings 05/02/2016  . Vaccine counseling 11/01/2015  . Borderline diabetes mellitus 11/03/2013  . Pure hypercholesterolemia 11/03/2013  . Atrial fibrillation with RVR (HCC) 09/14/2013  . Acquired hypothyroidism 09/14/2013  . Diverticulosis 09/14/2013  . Essential hypertension 09/14/2013  . GERD without esophagitis 09/14/2013  . Mitral valve prolapse 09/14/2013  . H/O cardiac catheterization 08/12/2008    Past Surgical History:  Procedure Laterality Date  . ABDOMINAL HYSTERECTOMY    . BREAST BIOPSY Left 1980   Negative  . CORONARY/GRAFT ACUTE MI REVASCULARIZATION N/A 06/28/2019   Procedure: Coronary/Graft Acute MI Revascularization;  Surgeon: Marykay Lex, MD;  Location: [facility] INVASIVE CV LAB;  Service: Cardiovascular;  Laterality: N/A;  . LEFT HEART CATH AND CORONARY ANGIOGRAPHY N/A 06/28/2019   Procedure: LEFT HEART CATH AND CORONARY ANGIOGRAPHY;  Surgeon: Marykay Lex, MD;  Location: [facility] INVASIVE CV LAB;  Service: Cardiovascular;  Laterality: N/A;    Prior to Admission medications   Medication Sig Start Date End Date Taking? Authorizing Provider  alendronate (FOSAMAX) 70 MG tablet Take 70 mg by mouth once a week. Take with a full glass of water on an empty stomach.    [provider]  alendronate (FOSAMAX) 70 MG tablet Take by mouth. 08/31/19   [provider]  amoxicillin (AMOXIL) 500 MG capsule Take 4 capsules by mouth as needed. Takes pre dental work 08/24/19   [provider]  atorvastatin (LIPITOR) 80 MG tablet Take 1 tablet (80 mg total) by mouth daily at 6 PM. 07/10/19   Angiulli, Mcarthur Rossetti, PA-C  clopidogrel (PLAVIX) 75 MG tablet Take 1 tablet (75 mg total) by mouth daily.  07/10/19   Angiulli, Mcarthur Rossetti, PA-C  clopidogrel (PLAVIX) 75 MG tablet Take by mouth. 09/07/19   [provider]  LANOXIN 62.5 MCG TABS  09/14/19   [provider]  levothyroxine (SYNTHROID) 88 MCG tablet Take 1 tablet (88 mcg total) by mouth daily. 07/10/19   Angiulli, Mcarthur Rossetti, PA-C  losartan (COZAAR) 25 MG tablet Take 0.5 tablets (12.5 mg total) by mouth daily. 07/10/19   Angiulli, Mcarthur Rossetti, PA-C  metoprolol succinate (TOPROL-XL) 50 MG 24 hr tablet Take 3 tablets (150 mg total) by mouth daily. Take with or immediately following a meal. 07/11/19   Angiulli, Mcarthur Rossetti, PA-C  omeprazole (PRILOSEC) 20 MG capsule 20 mg.  08/27/19   [provider]  pantoprazole (PROTONIX) 40 MG tablet Take 1 tablet (40 mg total) by mouth daily. 04/04/20 04/04/21  Arnaldo Natal, MD    Allergies Metronidazole and Oxycodone  Family History  Problem Relation Age of Onset  . Breast cancer Maternal Grandmother        72's    Social History Social History   Tobacco Use  . Smoking status: Never Smoker  . Smokeless tobacco: Never Used  Substance Use Topics  . Alcohol use: No  . Drug use: No    Review of Systems  Constitutional: No fever/chills Eyes: No visual changes. ENT: No sore throat. Cardiovascular:  chest pain. Respiratory: Denies shortness of breath. Gastrointestinal: Epigastric abdominal pain.  No nausea, no vomiting.  No diarrhea.  No constipation. Genitourinary: Negative for dysuria. Musculoskeletal: Negative for back pain. Skin: Negative for rash. Neurological: Negative for headaches, focal weakness  ____________________________________________   PHYSICAL EXAM:  VITAL SIGNS: ED Triage Vitals [04/04/20 0803]  Enc Vitals Group     BP      Pulse      Resp      Temp      Temp src      SpO2      Weight      Height      Head Circumference      Peak Flow      Pain Score 9     Pain Loc      Pain Edu?      Excl. in GC?    Constitutional: Alert and oriented.  Well appearing and in no acute distress. Eyes: Conjunctivae are normal.  Head: Atraumatic. Nose: No congestion/rhinnorhea. Mouth/Throat: Mucous membranes are moist.  Oropharynx non-erythematous. Neck: No stridor.   Cardiovascular: Normal rate, regular rhythm. (history of A. fib flutter) grossly normal heart sounds.  Good peripheral circulation. Respiratory: Normal respiratory effort.  No retractions. Lungs CTAB. Gastrointestinal: Soft and nontender except for mildly in the epigastric area. No distention. No abdominal bruits. No CVA tenderness. Musculoskeletal: No lower extremity tenderness nor edema.  No joint effusions. Neurologic:  Normal speech and language. No gross focal neurologic deficits are appreciated. No gait instability. Skin:  Skin is warm, dry and intact. No rash noted.  ____________________________________________   LABS (all labs ordered are listed, but only abnormal  results are displayed)  Labs Reviewed  BASIC METABOLIC PANEL - Abnormal; Notable for the following components:      Result Value   Glucose, Bld 121 (*)    Creatinine, Ser 1.10 (*)    GFR, Estimated 49 (*)    All other components within normal limits  CBC  LIPASE, BLOOD  URINALYSIS, COMPLETE (UACMP) WITH MICROSCOPIC  TROPONIN I (HIGH SENSITIVITY)  TROPONIN I (HIGH SENSITIVITY)   ____________________________________________  EKG  EKG read interpreted by me shows a flutter at a rate of 89 left axis no acute ST-T wave changes no apparent changes since February 2021 ____________________________________________  RADIOLOGY Jill Poling, personally viewed and evaluated these images (plain radiographs) as part of my medical decision making, as well as reviewing the written report by the radiologist.  ED MD interpretation: Chest x-ray read by radiology reviewed by me shows no acute changes.  Official radiology report(s): DG Chest 2 View  Result Date: 04/04/2020 CLINICAL DATA:  Chest pain.  Patient complains of reflux since yesterday morning. EXAM: CHEST - 2 VIEW COMPARISON:  One-view chest x-ray 06/30/2019 FINDINGS: Heart size is normal. Chronic interstitial coarsening is present. Linear atelectasis or scarring is present at the left base. No edema or effusion is present. No focal airspace disease is present. Atherosclerotic changes are noted at the aortic arch. Degenerative changes are present both shoulders. IMPRESSION: 1. No acute cardiopulmonary disease or significant interval change. 2. Chronic interstitial coarsening. 3. Aortic atherosclerosis. Electronically Signed   By: Marin Roberts M.D.   On: 04/04/2020 08:42    ____________________________________________   PROCEDURES  Procedure(s) performed (including Critical Care):  Procedures   ____________________________________________   INITIAL IMPRESSION / ASSESSMENT AND PLAN / ED COURSE  Patient symptoms resolved with GI cocktail.  Her EKG was unchanged from February 16 of this year.  She does have a flutter but there is no rapid ventricular response.  Her troponins remained stable during her visit.  They were not worrisome.  Patient is uncertain if she is taking omeprazole or not.  If she is I will have her double up on it as it was only 20 mg.  If she is not taking it I have given her prescription for Protonix.  She can also take Maalox if she has some breakthrough burning.  If she has any further pain with exertion I have advised her to come back.  She will follow-up with her regular doctor this week.  Symptoms and presentation are not consistent with hepatitis or gallbladder disease and really in view of the EKG being essentially normal and unchanged and the troponins being negative is very unlikely to be cardiac disease.  Reflux or ulcer disease or gastritis are the most likely and all these should be treated quite well with the proton pump inhibitors.              ____________________________________________   FINAL CLINICAL IMPRESSION(S) / ED DIAGNOSES  Final diagnoses:  Gastroesophageal reflux disease, unspecified whether esophagitis present     ED Discharge Orders         Ordered    pantoprazole (PROTONIX) 40 MG tablet  Daily        04/04/20 1051          *Please note:  ANAJULIA LEYENDECKER was evaluated in Emergency Department on 04/04/2020 for the symptoms described in the history of present illness. She was evaluated in the context of the global COVID-19 pandemic, which necessitated consideration that the patient might  be at risk for infection with the SARS-CoV-2 virus that causes COVID-19. Institutional protocols and algorithms that pertain to the evaluation of patients at risk for COVID-19 are in a state of rapid change based on information released by regulatory bodies including the CDC and federal and state organizations. These policies and algorithms were followed during the patient's care in the ED.  Some ED evaluations and interventions may be delayed as a result of limited staffing during and the pandemic.*   Note:  This document was prepared using Dragon voice recognition software and may include unintentional dictation errors.    Arnaldo NatalMalinda, Nasya Vincent F, MD 04/04/20 1058

## 2020-04-19 ENCOUNTER — Other Ambulatory Visit: Payer: Self-pay | Admitting: Gastroenterology

## 2020-04-19 DIAGNOSIS — K219 Gastro-esophageal reflux disease without esophagitis: Secondary | ICD-10-CM

## 2020-04-19 DIAGNOSIS — R1319 Other dysphagia: Secondary | ICD-10-CM

## 2020-04-19 DIAGNOSIS — R1310 Dysphagia, unspecified: Secondary | ICD-10-CM

## 2020-04-27 ENCOUNTER — Ambulatory Visit
Admission: RE | Admit: 2020-04-27 | Discharge: 2020-04-27 | Disposition: A | Discharge status: Home / Self Care | Payer: Medicare PPO | Source: Ambulatory Visit | Attending: Gastroenterology | Admitting: Gastroenterology

## 2020-04-27 ENCOUNTER — Other Ambulatory Visit: Payer: Self-pay

## 2020-04-27 DIAGNOSIS — R1310 Dysphagia, unspecified: Secondary | ICD-10-CM

## 2020-04-27 DIAGNOSIS — R1319 Other dysphagia: Secondary | ICD-10-CM | POA: Insufficient documentation

## 2020-04-27 DIAGNOSIS — K219 Gastro-esophageal reflux disease without esophagitis: Secondary | ICD-10-CM | POA: Insufficient documentation

## 2020-08-22 ENCOUNTER — Other Ambulatory Visit: Payer: Medicare PPO | Attending: Internal Medicine

## 2020-08-24 ENCOUNTER — Ambulatory Visit: Admission: RE | Admit: 2020-08-24 | Payer: Medicare PPO | Source: Home / Self Care | Admitting: Internal Medicine

## 2020-08-24 ENCOUNTER — Encounter: Admission: RE | Payer: Self-pay | Source: Home / Self Care

## 2020-08-24 SURGERY — ESOPHAGOGASTRODUODENOSCOPY (EGD) WITH PROPOFOL
Anesthesia: General

## 2020-10-14 ENCOUNTER — Other Ambulatory Visit
Admission: RE | Admit: 2020-10-14 | Discharge: 2020-10-14 | Disposition: A | Discharge status: Home / Self Care | Payer: Medicare PPO | Source: Ambulatory Visit | Attending: Infectious Diseases | Admitting: Infectious Diseases

## 2020-10-14 DIAGNOSIS — R600 Localized edema: Secondary | ICD-10-CM | POA: Diagnosis present

## 2020-10-14 DIAGNOSIS — M25441 Effusion, right hand: Secondary | ICD-10-CM | POA: Diagnosis present

## 2020-10-14 LAB — BRAIN NATRIURETIC PEPTIDE: B Natriuretic Peptide: 266.3 pg/mL — ABNORMAL HIGH (ref 0.0–100.0)

## 2020-10-31 ENCOUNTER — Other Ambulatory Visit
Admission: RE | Admit: 2020-10-31 | Discharge: 2020-10-31 | Disposition: A | Discharge status: Home / Self Care | Payer: Medicare PPO | Source: Ambulatory Visit | Attending: Physician Assistant | Admitting: Physician Assistant

## 2020-10-31 DIAGNOSIS — R600 Localized edema: Secondary | ICD-10-CM | POA: Diagnosis present

## 2020-10-31 DIAGNOSIS — Z79899 Other long term (current) drug therapy: Secondary | ICD-10-CM | POA: Insufficient documentation

## 2020-10-31 DIAGNOSIS — Z5181 Encounter for therapeutic drug level monitoring: Secondary | ICD-10-CM | POA: Diagnosis not present

## 2020-10-31 LAB — BRAIN NATRIURETIC PEPTIDE: B Natriuretic Peptide: 247.6 pg/mL — ABNORMAL HIGH (ref 0.0–100.0)

## 2020-12-07 ENCOUNTER — Other Ambulatory Visit: Payer: Self-pay | Admitting: Family Medicine

## 2020-12-07 ENCOUNTER — Other Ambulatory Visit (HOSPITAL_COMMUNITY): Payer: Self-pay | Admitting: Family Medicine

## 2020-12-07 DIAGNOSIS — R911 Solitary pulmonary nodule: Secondary | ICD-10-CM

## 2020-12-08 ENCOUNTER — Other Ambulatory Visit: Payer: Self-pay

## 2020-12-08 ENCOUNTER — Ambulatory Visit
Admission: RE | Admit: 2020-12-08 | Discharge: 2020-12-08 | Disposition: A | Discharge status: Home / Self Care | Payer: Medicare PPO | Source: Ambulatory Visit | Attending: Family Medicine | Admitting: Family Medicine

## 2020-12-08 DIAGNOSIS — R911 Solitary pulmonary nodule: Secondary | ICD-10-CM | POA: Diagnosis present

## 2021-01-04 ENCOUNTER — Other Ambulatory Visit
Admission: RE | Admit: 2021-01-04 | Discharge: 2021-01-04 | Disposition: A | Discharge status: Home / Self Care | Payer: Medicare PPO | Source: Ambulatory Visit | Attending: Physician Assistant | Admitting: Physician Assistant

## 2021-01-04 DIAGNOSIS — Z79899 Other long term (current) drug therapy: Secondary | ICD-10-CM | POA: Insufficient documentation

## 2021-01-04 DIAGNOSIS — I519 Heart disease, unspecified: Secondary | ICD-10-CM | POA: Insufficient documentation

## 2021-01-04 LAB — DIGOXIN LEVEL: Digoxin Level: <0.2 ng/mL — ABNORMAL LOW (ref 0.8–2.0)

## 2021-01-05 ENCOUNTER — Other Ambulatory Visit (HOSPITAL_BASED_OUTPATIENT_CLINIC_OR_DEPARTMENT_OTHER): Payer: Self-pay | Admitting: Family Medicine

## 2021-01-05 ENCOUNTER — Other Ambulatory Visit: Payer: Self-pay | Admitting: Family Medicine

## 2021-01-05 DIAGNOSIS — R918 Other nonspecific abnormal finding of lung field: Secondary | ICD-10-CM

## 2021-02-14 ENCOUNTER — Other Ambulatory Visit: Payer: Self-pay

## 2021-02-14 ENCOUNTER — Emergency Department
Admission: EM | Admit: 2021-02-14 | Discharge: 2021-02-15 | Disposition: A | Discharge status: Home / Self Care | Payer: Medicare PPO | Attending: Emergency Medicine | Admitting: Emergency Medicine

## 2021-02-14 ENCOUNTER — Emergency Department: Payer: Medicare PPO

## 2021-02-14 DIAGNOSIS — R001 Bradycardia, unspecified: Secondary | ICD-10-CM

## 2021-02-14 DIAGNOSIS — Z20822 Contact with and (suspected) exposure to covid-19: Secondary | ICD-10-CM | POA: Insufficient documentation

## 2021-02-14 DIAGNOSIS — N183 Chronic kidney disease, stage 3 unspecified: Secondary | ICD-10-CM | POA: Insufficient documentation

## 2021-02-14 DIAGNOSIS — Z7901 Long term (current) use of anticoagulants: Secondary | ICD-10-CM | POA: Insufficient documentation

## 2021-02-14 DIAGNOSIS — Z79899 Other long term (current) drug therapy: Secondary | ICD-10-CM | POA: Diagnosis not present

## 2021-02-14 DIAGNOSIS — I4891 Unspecified atrial fibrillation: Secondary | ICD-10-CM | POA: Insufficient documentation

## 2021-02-14 DIAGNOSIS — E039 Hypothyroidism, unspecified: Secondary | ICD-10-CM | POA: Diagnosis not present

## 2021-02-14 DIAGNOSIS — N3000 Acute cystitis without hematuria: Secondary | ICD-10-CM | POA: Insufficient documentation

## 2021-02-14 DIAGNOSIS — I509 Heart failure, unspecified: Secondary | ICD-10-CM | POA: Insufficient documentation

## 2021-02-14 DIAGNOSIS — W19XXXA Unspecified fall, initial encounter: Secondary | ICD-10-CM

## 2021-02-14 DIAGNOSIS — W1839XA Other fall on same level, initial encounter: Secondary | ICD-10-CM | POA: Insufficient documentation

## 2021-02-14 DIAGNOSIS — I130 Hypertensive heart and chronic kidney disease with heart failure and stage 1 through stage 4 chronic kidney disease, or unspecified chronic kidney disease: Secondary | ICD-10-CM | POA: Diagnosis not present

## 2021-02-14 DIAGNOSIS — Z7902 Long term (current) use of antithrombotics/antiplatelets: Secondary | ICD-10-CM | POA: Insufficient documentation

## 2021-02-14 DIAGNOSIS — R531 Weakness: Secondary | ICD-10-CM | POA: Diagnosis present

## 2021-02-14 LAB — URINALYSIS, COMPLETE (UACMP) WITH MICROSCOPIC
Bilirubin Urine: NEGATIVE
Glucose, UA: NEGATIVE mg/dL
Ketones, ur: NEGATIVE mg/dL
Nitrite: NEGATIVE
Protein, ur: NEGATIVE mg/dL
Specific Gravity, Urine: 1.02 (ref 1.005–1.030)
pH: 5 (ref 5.0–8.0)

## 2021-02-14 LAB — COMPREHENSIVE METABOLIC PANEL
ALT: 14 U/L (ref 0–44)
AST: 22 U/L (ref 15–41)
Albumin: 3.5 g/dL (ref 3.5–5.0)
Alkaline Phosphatase: 83 U/L (ref 38–126)
Anion gap: 6 (ref 5–15)
BUN: 16 mg/dL (ref 8–23)
CO2: 25 mmol/L (ref 22–32)
Calcium: 8.7 mg/dL — ABNORMAL LOW (ref 8.9–10.3)
Chloride: 109 mmol/L (ref 98–111)
Creatinine, Ser: 0.98 mg/dL (ref 0.44–1.00)
GFR, Estimated: 56 mL/min — ABNORMAL LOW (ref >60)
Glucose, Bld: 90 mg/dL (ref 70–99)
Potassium: 3.9 mmol/L (ref 3.5–5.1)
Sodium: 140 mmol/L (ref 135–145)
Total Bilirubin: 1 mg/dL (ref 0.3–1.2)
Total Protein: 5.9 g/dL — ABNORMAL LOW (ref 6.5–8.1)

## 2021-02-14 LAB — CBC WITH DIFFERENTIAL/PLATELET
Abs Immature Granulocytes: 0.03 10*3/uL (ref 0.00–0.07)
Basophils Absolute: 0 10*3/uL (ref 0.0–0.1)
Basophils Relative: 1 %
Eosinophils Absolute: 0.3 10*3/uL (ref 0.0–0.5)
Eosinophils Relative: 5 %
HCT: 39.3 % (ref 36.0–46.0)
Hemoglobin: 12.7 g/dL (ref 12.0–15.0)
Immature Granulocytes: 0 %
Lymphocytes Relative: 22 %
Lymphs Abs: 1.7 10*3/uL (ref 0.7–4.0)
MCH: 31.5 pg (ref 26.0–34.0)
MCHC: 32.3 g/dL (ref 30.0–36.0)
MCV: 97.5 fL (ref 80.0–100.0)
Monocytes Absolute: 1.1 10*3/uL — ABNORMAL HIGH (ref 0.1–1.0)
Monocytes Relative: 14 %
Neutro Abs: 4.3 10*3/uL (ref 1.7–7.7)
Neutrophils Relative %: 58 %
Platelets: 240 10*3/uL (ref 150–400)
RBC: 4.03 MIL/uL (ref 3.87–5.11)
RDW: 13.6 % (ref 11.5–15.5)
WBC: 7.4 10*3/uL (ref 4.0–10.5)
nRBC: 0 % (ref 0.0–0.2)

## 2021-02-14 LAB — RESP PANEL BY RT-PCR (FLU A&B, COVID) ARPGX2
Influenza A by PCR: NEGATIVE
Influenza B by PCR: NEGATIVE
SARS Coronavirus 2 by RT PCR: NEGATIVE

## 2021-02-14 LAB — TROPONIN I (HIGH SENSITIVITY): Troponin I (High Sensitivity): 18 ng/L — ABNORMAL HIGH (ref <18)

## 2021-02-14 MED ORDER — SULFAMETHOXAZOLE-TRIMETHOPRIM 800-160 MG PO TABS: 1.0000 | ORAL_TABLET | Freq: Two times a day (BID) | ORAL | 0 refills | Status: AC

## 2021-02-14 MED ORDER — LACTATED RINGERS IV BOLUS
500.0000 mL | Freq: Once | INTRAVENOUS | Status: AC
  Administered 2021-02-14: 500 mL via INTRAVENOUS

## 2021-02-14 MED ORDER — SODIUM CHLORIDE 0.9 % IV SOLN
1.0000 g | Freq: Once | INTRAVENOUS | Status: AC
  Administered 2021-02-14: 1 g via INTRAVENOUS
  Filled 2021-02-14: qty 10

## 2021-02-14 MED ORDER — IOHEXOL 350 MG/ML SOLN
60.0000 mL | Freq: Once | INTRAVENOUS | Status: AC | PRN
  Administered 2021-02-14: 60 mL via INTRAVENOUS

## 2021-02-14 NOTE — ED Notes (Signed)
Ambulatory saturations were 95%

## 2021-02-14 NOTE — ED Triage Notes (Signed)
Pt reports generalized weakness a few days ago. Pt just finished an antibiotic for a UTI.

## 2021-02-14 NOTE — ED Provider Notes (Signed)
[facility] Emergency Department Provider Note  ____________________________________________   Event Date/Time   First MD Initiated Contact with Patient 02/14/21 2002     (approximate)  I have reviewed the triage vital signs and the nursing notes.   HISTORY  Chief Complaint No chief complaint on file.   HPI Courtney Chan is a 87 y.o. female with past medical history of HTN, HDL, CVA without residual deficits and hypothyroidism as well as CHF and A. fib on Eliquis and metoprolol as well as PAD, CKD and GERD who presents accompanied by her daughter for assessment of some increased weakness over the last 2 or 3 days.  Patient states he finished a course of antibiotics for UTI 2 days ago.  She states she still has a little bit of discomfort in suprapubic area but otherwise has not had any burning with urination.  She states she fell 2 days ago and hit her head.  Daughter states she witnessed this and that the patient had actually sat down too heavily on something that broke and she fell backwards.  Patient denying any headache, chest pain, cough, shortness of breath, abdominal pain, vomiting, acute diarrhea, rash or extremity pain.  No back pain or upper abdominal pain.  No other acute concerns at this time.  Daughter states that she feels patient has been not herself and has been more forgetful and a little confused.         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   Debility 07/04/2019   CHF (congestive heart failure) (HCC)    Atrial fibrillation (HCC)    Acute cerebral infarction (HCC)    Non-ST elevation (NSTEMI) myocardial infarction (HCC)    Non-ST elevation MI  (NSTEMI) (HCC) 06/28/2019   Cardiac arrest due to underlying cardiac condition (HCC) 06/28/2019   Cardiac arrest (HCC) 06/28/2019   Ventilator dependent (HCC)    PAD (peripheral artery disease) (HCC) 01/11/2019   Acute cystitis without hematuria 12/31/2018   Diarrhea 12/31/2018   CKD (chronic kidney disease) stage 3, GFR 30-59 ml/min (HCC) 05/27/2018   History of TIA (transient ischemic attack) 05/27/2018   Persistent atrial fibrillation (HCC) 08/25/2017   Osteopenia of multiple sites 11/09/2016   DNR (do not resuscitate) 10/31/2016   Encounter for general adult medical examination without abnormal findings 05/02/2016   Vaccine counseling 11/01/2015   Borderline diabetes mellitus 11/03/2013   Pure hypercholesterolemia 11/03/2013   Atrial fibrillation with RVR (HCC) 09/14/2013   Acquired hypothyroidism 09/14/2013   Diverticulosis 09/14/2013   Essential hypertension 09/14/2013   GERD without esophagitis 09/14/2013   Mitral valve prolapse 09/14/2013   H/O cardiac catheterization 08/12/2008    Past Surgical History:  Procedure Laterality Date   ABDOMINAL HYSTERECTOMY     BREAST BIOPSY Left 1980   Negative   CORONARY/GRAFT ACUTE MI REVASCULARIZATION N/A 06/28/2019   Procedure: Coronary/Graft Acute MI Revascularization;  Surgeon: Marykay Lex, MD;  Location: [facility] INVASIVE CV LAB;  Service: Cardiovascular;  Laterality: N/A;   LEFT HEART CATH AND CORONARY ANGIOGRAPHY N/A 06/28/2019   Procedure: LEFT HEART CATH AND CORONARY ANGIOGRAPHY;  Surgeon: Marykay Lex, MD;  Location: [facility]  INVASIVE CV LAB;  Service: Cardiovascular;  Laterality: N/A;    Prior to Admission medications   Medication Sig Start Date End Date Taking? Authorizing Provider  sulfamethoxazole-trimethoprim (BACTRIM DS) 800-160 MG tablet Take 1 tablet by mouth 2 (two) times daily for 5 days. 02/14/21 02/19/21 Yes Gilles Chiquito, MD  alendronate (FOSAMAX) 70 MG tablet Take 70 mg by mouth once a week. Take with a full  glass of water on an empty stomach.    [provider]  alendronate (FOSAMAX) 70 MG tablet Take by mouth. 08/31/19   [provider]  atorvastatin (LIPITOR) 80 MG tablet Take 1 tablet (80 mg total) by mouth daily at 6 PM. 07/10/19   Angiulli, Mcarthur Rossetti, PA-C  clopidogrel (PLAVIX) 75 MG tablet Take 1 tablet (75 mg total) by mouth daily. 07/10/19   Angiulli, Mcarthur Rossetti, PA-C  clopidogrel (PLAVIX) 75 MG tablet Take by mouth. 09/07/19   [provider]  LANOXIN 62.5 MCG TABS  09/14/19   [provider]  levothyroxine (SYNTHROID) 88 MCG tablet Take 1 tablet (88 mcg total) by mouth daily. 07/10/19   Angiulli, Mcarthur Rossetti, PA-C  losartan (COZAAR) 25 MG tablet Take 0.5 tablets (12.5 mg total) by mouth daily. 07/10/19   Angiulli, Mcarthur Rossetti, PA-C  metoprolol succinate (TOPROL-XL) 50 MG 24 hr tablet Take 3 tablets (150 mg total) by mouth daily. Take with or immediately following a meal. 07/11/19   Angiulli, Mcarthur Rossetti, PA-C  omeprazole (PRILOSEC) 20 MG capsule 20 mg.  08/27/19   [provider]  pantoprazole (PROTONIX) 40 MG tablet Take 1 tablet (40 mg total) by mouth daily. 04/04/20 04/04/21  Arnaldo Natal, MD    Allergies Metronidazole and Oxycodone  Family History  Problem Relation Age of Onset   Breast cancer Maternal Grandmother        29's    Social History Social History   Tobacco Use   Smoking status: Never   Smokeless tobacco: Never  Substance Use Topics   Alcohol use: No   Drug use: No    Review of Systems  Review of Systems  Constitutional:  Negative for chills and fever.  HENT:  Negative for sore throat.   Eyes:  Negative for pain.  Respiratory:  Negative for cough and stridor.   Cardiovascular:  Negative for chest pain.  Gastrointestinal:  Positive for abdominal pain. Negative for vomiting.  Genitourinary:  Negative for dysuria.  Musculoskeletal:  Positive for falls.  Skin:  Negative for rash.  Neurological:  Negative for seizures, loss of  consciousness and headaches.  Psychiatric/Behavioral:  Positive for memory loss. Negative for suicidal ideas.   All other systems reviewed and are negative.    ____________________________________________   PHYSICAL EXAM:  VITAL SIGNS: ED Triage Vitals  Enc Vitals Group     BP 02/14/21 2011 (!) 161/66     Pulse Rate 02/14/21 2011 61     Resp 02/14/21 2011 (!) 22     Temp 02/14/21 2011 99.1 F (37.3 C)     Temp Source 02/14/21 2011 Oral     SpO2 02/14/21 2011 95 %     Weight 02/14/21 2012 150 lb (68 kg)     Height --      Head Circumference --      Peak Flow --      Pain Score 02/14/21 2012 0     Pain Loc --      Pain Edu? --      Excl. in GC? --  Vitals:   02/14/21 2030 02/14/21 2315  BP: (!) 167/65 (!) 170/69  Pulse: (!) 50 63  Resp: 19 (!) 24  Temp:    SpO2: 95% 94%   Physical Exam Vitals and nursing note reviewed.  Constitutional:      General: She is not in acute distress.    Appearance: She is well-developed. She is obese.  HENT:     Head: Normocephalic and atraumatic.     Right Ear: External ear normal.     Left Ear: External ear normal.  Eyes:     Conjunctiva/sclera: Conjunctivae normal.  Cardiovascular:     Rate and Rhythm: Normal rate and regular rhythm.     Heart sounds: No murmur heard. Pulmonary:     Effort: Pulmonary effort is normal. No respiratory distress.     Breath sounds: Normal breath sounds.  Abdominal:     Palpations: Abdomen is soft.     Tenderness: There is no abdominal tenderness.  Musculoskeletal:     Cervical back: Neck supple.  Skin:    General: Skin is warm and dry.  Neurological:     Mental Status: She is alert and oriented to person, place, and time.  Psychiatric:        Mood and Affect: Mood normal.    Cranial nerves II through XII grossly intact.  No pronator drift.  No finger dysmetria.  Symmetric 5/5 strength of all extremities.  Sensation intact to light touch in all extremities.   ____________________________________________   LABS (all labs ordered are listed, but only abnormal results are displayed)  Labs Reviewed  CBC WITH DIFFERENTIAL/PLATELET - Abnormal; Notable for the following components:      Result Value   Monocytes Absolute 1.1 (*)    All other components within normal limits  COMPREHENSIVE METABOLIC PANEL - Abnormal; Notable for the following components:   Calcium 8.7 (*)    Total Protein 5.9 (*)    GFR, Estimated 56 (*)    All other components within normal limits  URINALYSIS, COMPLETE (UACMP) WITH MICROSCOPIC - Abnormal; Notable for the following components:   Color, Urine YELLOW (*)    APPearance CLOUDY (*)    Hgb urine dipstick SMALL (*)    Leukocytes,Ua LARGE (*)    Bacteria, UA RARE (*)    All other components within normal limits  TROPONIN I (HIGH SENSITIVITY) - Abnormal; Notable for the following components:   Troponin I (High Sensitivity) 18 (*)    All other components within normal limits  RESP PANEL BY RT-PCR (FLU A&B, COVID) [id number]  URINE CULTURE  DIGOXIN LEVEL  TROPONIN I (HIGH SENSITIVITY)   ____________________________________________  EKG  A. fib with a ventricular rate of 52, normal axis, nonspecific ST changes in several leads and overall low amplitude without other clear evidence of acute ischemia. ____________________________________________  RADIOLOGY  ED MD interpretation: CT head shows no evidence of skull fracture, intracranial hemorrhage, subacute CVA or any other clear acute intracranial process.  CT C-spine shows no evidence of acute C-spine injury but does show multilevel degenerative changes.  There is some mild anterolisthesis of C4 on C5 but no acute abnormality noted.  Chest x-ray with some chronic appearing interstitial lung markings without evidence of focal consolidation, overt edema, pneumothorax, effusion or other clear acute thoracic process.  CT abdomen pelvis shows diverticuli without evidence  of diverticulitis, tiny hiatal hernia and aortic atherosclerosis without evidence of SBO, perinephric stranding, kidney stone, cholecystitis, pancreatitis or other acute abdominopelvic process.  Official radiology report(s):  DG Chest 2 View  Result Date: 02/14/2021 CLINICAL DATA:  Generalized weakness. EXAM: CHEST - 2 VIEW COMPARISON:  April 04, 2020 FINDINGS: Mild, diffuse, chronic appearing increased interstitial lung markings are seen. Mild atelectasis is noted within the left lung base. There is no evidence of a pleural effusion or pneumothorax. The cardiac silhouette is borderline in size and unchanged in appearance. There is calcification of the aortic arch. The visualized skeletal structures are unremarkable. IMPRESSION: Chronic appearing increased interstitial lung markings with mild left basilar atelectasis. Electronically Signed   By: Aram Candela M.D.   On: 02/14/2021 21:13   CT HEAD WO CONTRAST ( )  Result Date: 02/14/2021 CLINICAL DATA:  Generalized weakness and altered mental status, initial encounter EXAM: CT HEAD WITHOUT CONTRAST CT CERVICAL SPINE WITHOUT CONTRAST TECHNIQUE: Multidetector CT imaging of the head and cervical spine was performed following the standard protocol without intravenous contrast. Multiplanar CT image reconstructions of the cervical spine were also generated. COMPARISON:  11/06/2019 FINDINGS: CT HEAD FINDINGS Brain: No evidence of acute infarction, hemorrhage, hydrocephalus, extra-axial collection or mass lesion/mass effect. Chronic atrophic and ischemic changes are noted. Vascular: No hyperdense vessel or unexpected calcification. Skull: Normal. Negative for fracture or focal lesion. Sinuses/Orbits: No acute finding. Other: None. CT CERVICAL SPINE FINDINGS Alignment: Within normal limits. Skull base and vertebrae: Cervical segments are well visualized. Vertebral body height is well maintained. Multilevel facet hypertrophic changes are noted. No significant  osteophytic changes are seen. Mild degenerative anterolisthesis of C4 on C5 is noted. No acute fracture or acute facet abnormality is noted. The odontoid is within normal limits. Soft tissues and spinal canal: Surrounding soft tissue structures are within normal limits. Diffuse vascular calcifications are seen. Upper chest: Visualized lung apices are unremarkable. Other: None IMPRESSION: CT of the head: Chronic atrophic and ischemic changes without acute abnormality. CT of the cervical spine: Multilevel degenerative change with mild anterolisthesis of C4 on C5. No acute abnormality is noted. Electronically Signed   By: Alcide Clever M.D.   On: 02/14/2021 22:00   CT Cervical Spine Wo Contrast  Result Date: 02/14/2021 CLINICAL DATA:  Generalized weakness and altered mental status, initial encounter EXAM: CT HEAD WITHOUT CONTRAST CT CERVICAL SPINE WITHOUT CONTRAST TECHNIQUE: Multidetector CT imaging of the head and cervical spine was performed following the standard protocol without intravenous contrast. Multiplanar CT image reconstructions of the cervical spine were also generated. COMPARISON:  11/06/2019 FINDINGS: CT HEAD FINDINGS Brain: No evidence of acute infarction, hemorrhage, hydrocephalus, extra-axial collection or mass lesion/mass effect. Chronic atrophic and ischemic changes are noted. Vascular: No hyperdense vessel or unexpected calcification. Skull: Normal. Negative for fracture or focal lesion. Sinuses/Orbits: No acute finding. Other: None. CT CERVICAL SPINE FINDINGS Alignment: Within normal limits. Skull base and vertebrae: Cervical segments are well visualized. Vertebral body height is well maintained. Multilevel facet hypertrophic changes are noted. No significant osteophytic changes are seen. Mild degenerative anterolisthesis of C4 on C5 is noted. No acute fracture or acute facet abnormality is noted. The odontoid is within normal limits. Soft tissues and spinal canal: Surrounding soft tissue  structures are within normal limits. Diffuse vascular calcifications are seen. Upper chest: Visualized lung apices are unremarkable. Other: None IMPRESSION: CT of the head: Chronic atrophic and ischemic changes without acute abnormality. CT of the cervical spine: Multilevel degenerative change with mild anterolisthesis of C4 on C5. No acute abnormality is noted. Electronically Signed   By: Alcide Clever M.D.   On: 02/14/2021 22:00   CT ABDOMEN PELVIS W CONTRAST  Result Date: 02/14/2021 CLINICAL DATA:  Nonlocalized acute abdominal pain. Generalized weakness. Antibiotic for UTI just finished. EXAM: CT ABDOMEN AND PELVIS WITH CONTRAST TECHNIQUE: Multidetector CT imaging of the abdomen and pelvis was performed using the standard protocol following bolus administration of intravenous contrast. CONTRAST:  64mL OMNIPAQUE IOHEXOL 350 MG/ML SOLN COMPARISON:  CT chest 12/08/2020, abdomen pelvis 12/18/2018 FINDINGS: Lower chest: Interval increase in size of a 7 mm (from 4 mm) pulmonary nodule at the right base. Bilateral lower lobe subsegmental atelectasis. Prominent right atrium. Coronary calcifications. Mitral annular calcifications. Tiny hiatal hernia. Hepatobiliary: No focal liver abnormality. No gallstones, gallbladder wall thickening, or pericholecystic fluid. No biliary dilatation. Pancreas: No focal lesion. Normal pancreatic contour. No surrounding inflammatory changes. No main pancreatic ductal dilatation. Spleen: Normal in size without focal abnormality. Adrenals/Urinary Tract: No adrenal nodule bilaterally. Bilateral kidneys enhance symmetrically. No hydronephrosis. No hydroureter. The urinary bladder is unremarkable. Stomach/Bowel: Stomach is within normal limits. No evidence of bowel wall thickening or dilatation. Scattered colonic diverticulosis. Most prominent throughout the sigmoid colon. The appendix not definitely identified. Vascular/Lymphatic: No abdominal aorta or iliac aneurysm. Severe atherosclerotic  plaque of the aorta and its branches. No abdominal, pelvic, or inguinal lymphadenopathy. Reproductive: Status post hysterectomy. No adnexal masses. Other: No intraperitoneal free fluid. No intraperitoneal free gas. No organized fluid collection. Musculoskeletal: No abdominal wall hernia or abnormality. No suspicious lytic or blastic osseous lesions. No acute displaced fracture. Multilevel degenerative changes of the spine. IMPRESSION: 1. Colonic diverticulosis with no acute diverticulitis. 2. Tiny hiatal hernia. 3. Prominent right atrium. 4.  Aortic Atherosclerosis (ICD10-I70.0). Electronically Signed   By: Tish Frederickson M.D.   On: 02/14/2021 22:06    ____________________________________________   PROCEDURES  Procedure(s) performed (including Critical Care):  .1-3 Lead EKG Interpretation Performed by: Gilles Chiquito, MD Authorized by: Gilles Chiquito, MD     Interpretation: non-specific     ECG rate assessment: bradycardic     Rhythm: atrial fibrillation     ____________________________________________   INITIAL IMPRESSION / ASSESSMENT AND PLAN / ED COURSE      Presents with above-stated history exam for increased fatigue and little forgetfulness per daughter at bedside in the setting of recently completing course of antibiotics for UTI.  Patient is soft endorsing some mild suprapubic discomfort.  She is denying any other acute sick symptoms.  She has a nonfocal neurological exam and is oriented x4.  Differential includes some mild forgetfulness and suprapubic discomfort as well as overall weakness from ongoing UTI, head injury including intracranial hemorrhage from fall 2 days ago, occult pneumonia, CHF, metabolic derangement, arrhythmia, ACS and dehydration.  CT head shows no evidence of skull fracture, intracranial hemorrhage, subacute CVA or any other clear acute intracranial process.  CT C-spine shows no evidence of acute C-spine injury but does show multilevel degenerative  changes.  There is some mild anterolisthesis of C4 on C5 but no acute abnormality noted.  Chest x-ray with some chronic appearing interstitial lung markings without evidence of focal consolidation, overt edema, pneumothorax, effusion or other clear acute thoracic process.  CT abdomen pelvis shows diverticuli without evidence of diverticulitis, tiny hiatal hernia and aortic atherosclerosis without evidence of SBO, perinephric stranding, kidney stone, cholecystitis, pancreatitis or other acute abdominopelvic process.  CMP shows no significant electrolyte or metabolic derangements.  CBC shows no leukocytosis or acute anemia.  Initial ECG shows some nonspecific findings and initial troponin is at the upper limit of normal.  We will plan to obtain a delta troponin and  if stable or downtrending I have very low suspicion for ACS.  Patient is on digoxin and diltiazem digoxin levels as well to make sure this is therapeutic as supratherapeutic levels could be contributing to overall weakness.  UA is concerning for ongoing cystitis with large leukocyte esterase, 21-50 WBCs and some bacteria seen as well.  Urine culture sent.  Patient does not appear septic.  However while going above work-up we will give her a dose of Rocephin for her UTI.  It seems patient had been on ciprofloxacin for this unfortunate there are no urine cultures available.  We will transition to Bactrim as it is possible she does not respond to the Cipro and send a urine culture today.  Care patient signed over to oncoming provider at approximately 2300.  Plan is to follow-up repeat troponin and digoxin level.  If these are normal and patient is not orthostatic I think she will be able to follow-up with her PCP and safely discharged home.     ____________________________________________   FINAL CLINICAL IMPRESSION(S) / ED DIAGNOSES  Final diagnoses:  Acute cystitis without hematuria  Fall, initial encounter    Medications   iohexol (OMNIPAQUE) 350 MG/ML injection 60 mL (60 mLs Intravenous Contrast Given 02/14/21 2134)  cefTRIAXone (ROCEPHIN) 1 g in sodium chloride 0.9 % 100 mL IVPB (0 g Intravenous Stopped 02/14/21 2305)  lactated ringers bolus 500 mL (500 mLs Intravenous New Bag/Given 02/14/21 2304)     ED Discharge Orders          Ordered    sulfamethoxazole-trimethoprim (BACTRIM DS) 800-160 MG tablet  2 times daily        02/14/21 2323             Note:  This document was prepared using Dragon voice recognition software and may include unintentional dictation errors.    Gilles Chiquito, MD 02/14/21 [phone]

## 2021-02-14 NOTE — ED Notes (Signed)
Pt to rdaiology for ct at this time

## 2021-02-15 LAB — URINE CULTURE: Culture: NO GROWTH

## 2021-02-15 LAB — DIGOXIN LEVEL: Digoxin Level: 0.9 ng/mL (ref 0.8–2.0)

## 2021-02-15 LAB — TROPONIN I (HIGH SENSITIVITY): Troponin I (High Sensitivity): 18 ng/L — ABNORMAL HIGH (ref <18)

## 2021-02-15 NOTE — ED Provider Notes (Signed)
-----------------------------------------   2:03 AM on 02/15/2021 -----------------------------------------   Repeat troponin stable.  Delay secondary to performing digoxin level which has come back within normal limits.  Will discharge home per previous provider with prescription for Bactrim sent to her pharmacy.  Encouraged patient to follow-up both with her cardiologist to address sinus bradycardia in the setting of beta-blocker use, as well as her PCP for follow-up UTI.  Strict return precautions given.  Patient and her daughter verbalized understanding agree with plan of care   Irean Hong, MD 02/15/21 [phone]

## 2021-02-15 NOTE — Discharge Instructions (Addendum)
Please take antibiotic as prescribed.  Urine culture is pending.  We will notify you if we need to change your antibiotic.  Return to the ER for worsening symptoms, persistent vomiting, difficulty breathing or other concerns.

## 2021-03-06 ENCOUNTER — Ambulatory Visit: Payer: Medicare PPO

## 2021-03-07 ENCOUNTER — Ambulatory Visit: Payer: Medicare PPO

## 2021-05-29 DIAGNOSIS — R569 Unspecified convulsions: Secondary | ICD-10-CM

## 2021-07-08 IMAGING — CT CT ANGIO NECK
1 of 8 series · 13 of 46 positions shown, 18 images · IV contrast (OMNI)
Comparison: None.

CLINICAL DATA: Stroke follow-up

EXAM:
CT ANGIOGRAPHY HEAD AND NECK
TECHNIQUE: Multidetector CT imaging of the head and neck was performed using
the standard protocol during bolus administration of intravenous
contrast. Multiplanar CT image reconstructions and MIPs were
obtained to evaluate the vascular anatomy. Carotid stenosis
measurements (when applicable) are obtained utilizing NASCET
criteria, using the distal internal carotid diameter as the
denominator.
CONTRAST:  Dose is currently not available

[Series 12: thin · axial · 0.51mm/px · z∈[+997,+1306]mm · 13 of 708 slices shown, 18 images]
[im 45/708  soft-tissue]
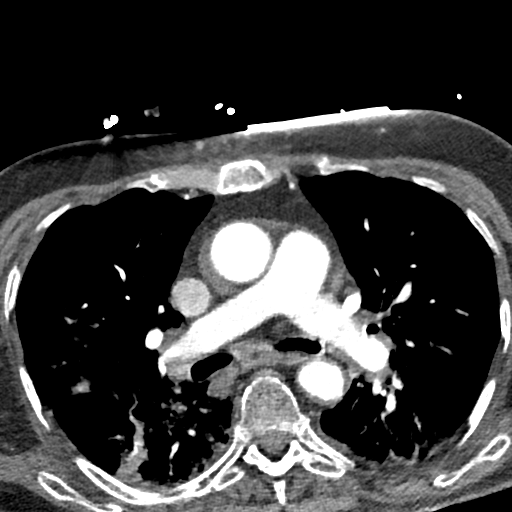
[im 45/708  bone]
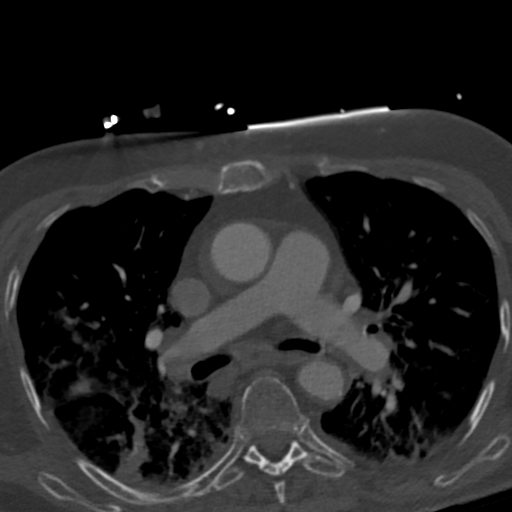
[im 89/708  soft-tissue]
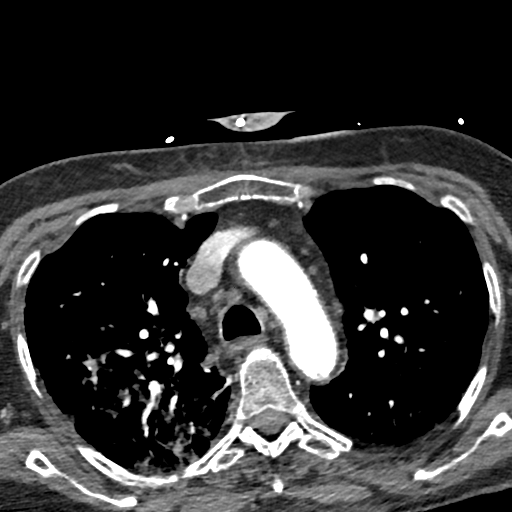
[im 177/708  soft-tissue]
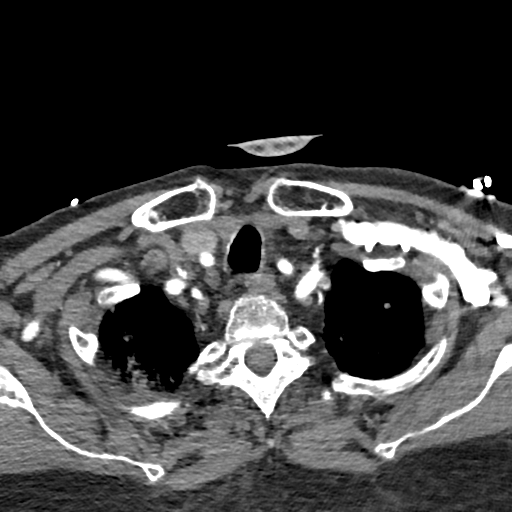
[im 221/708  soft-tissue]
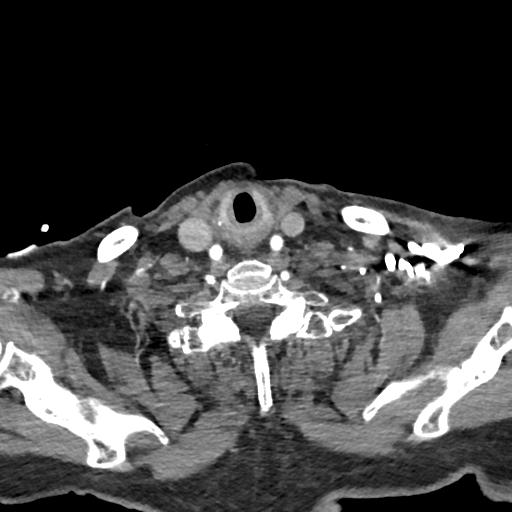
[im 266/708  soft-tissue]
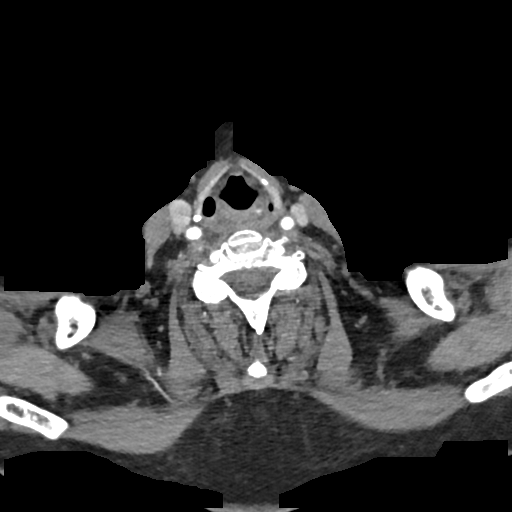
[im 310/708  soft-tissue]
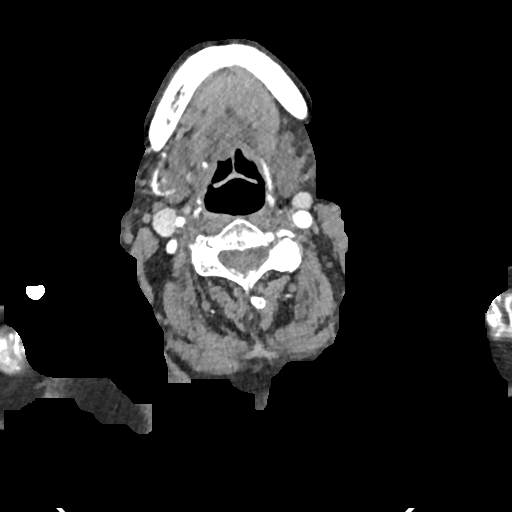
[im 398/708  soft-tissue]
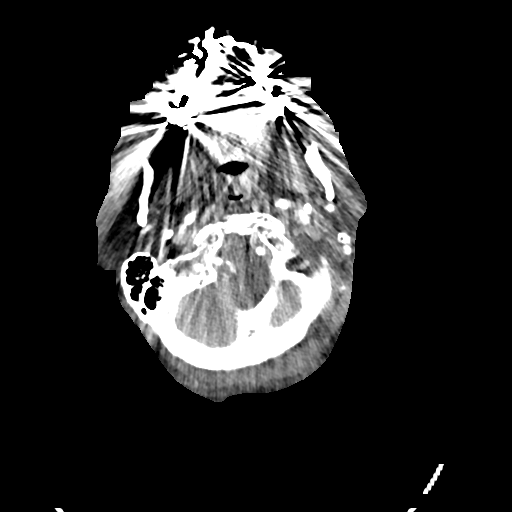
[im 442/708  soft-tissue]
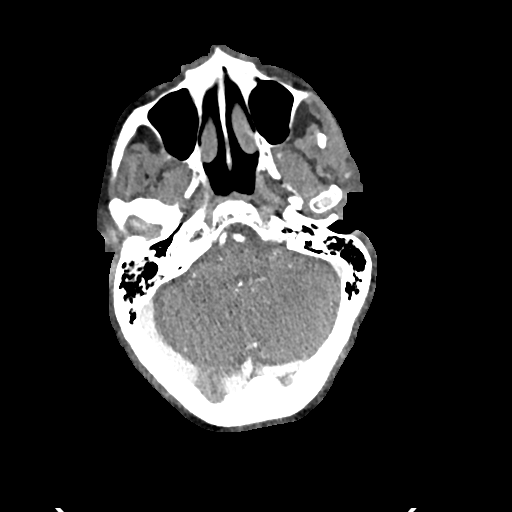
[im 487/708  soft-tissue]
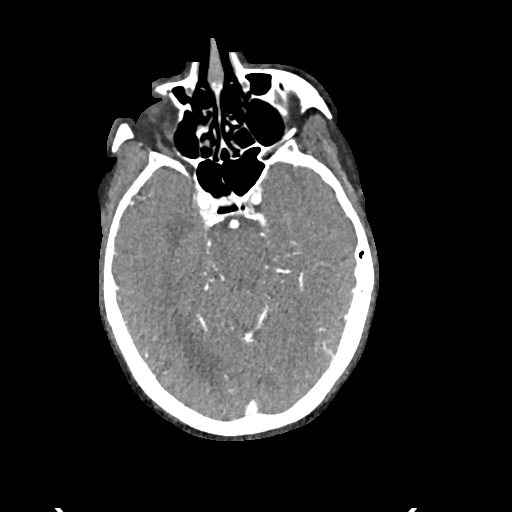
[im 487/708  bone]
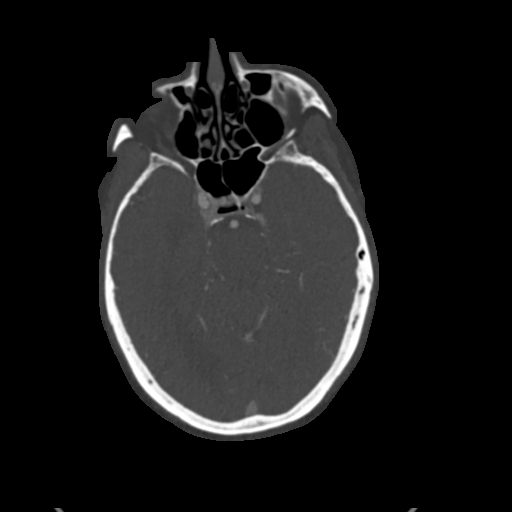
[im 531/708  soft-tissue]
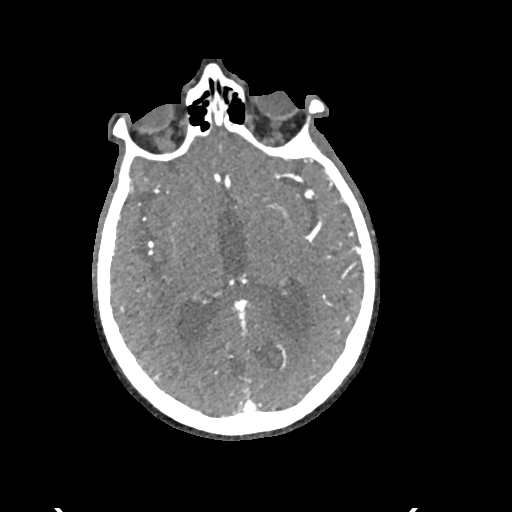
[im 531/708  lung]
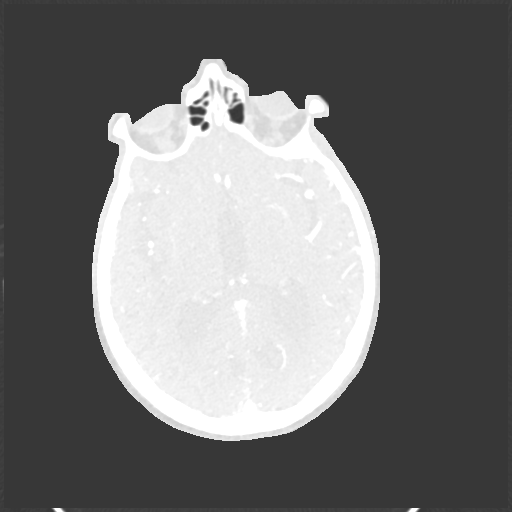
[im 575/708  lung]
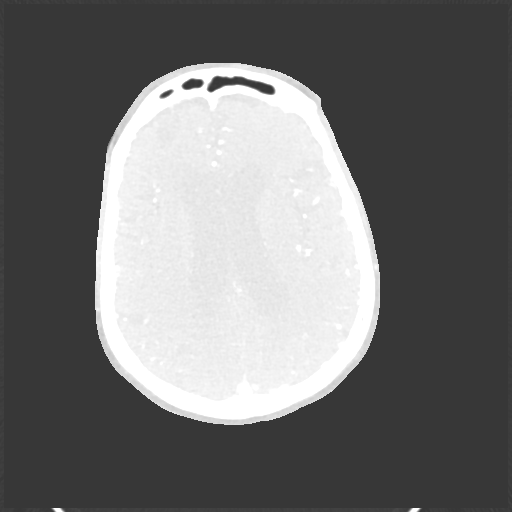
[im 619/708  soft-tissue]
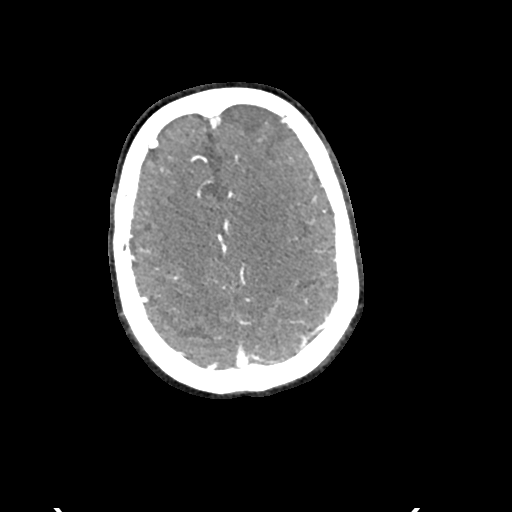
[im 619/708  lung]
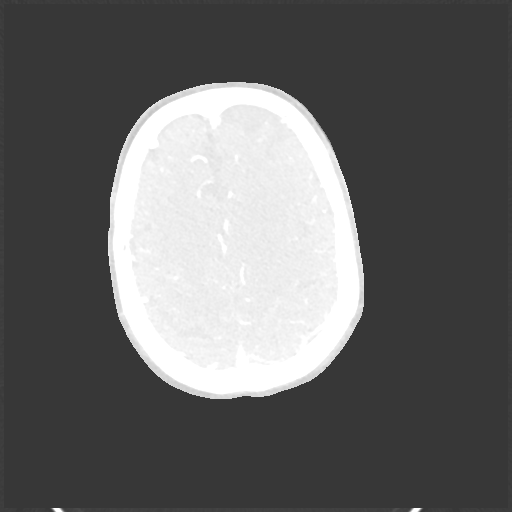
[im 663/708  soft-tissue]
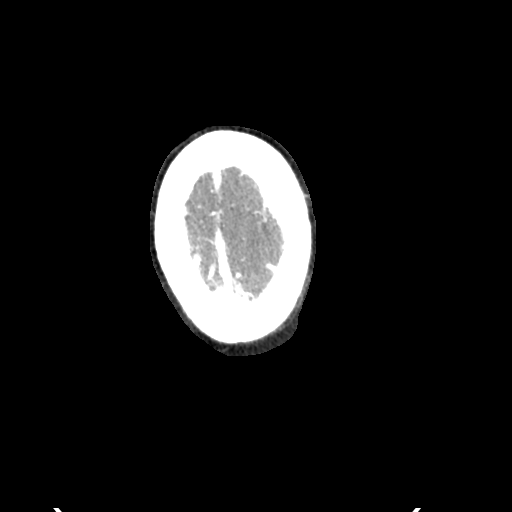
[im 663/708  lung]
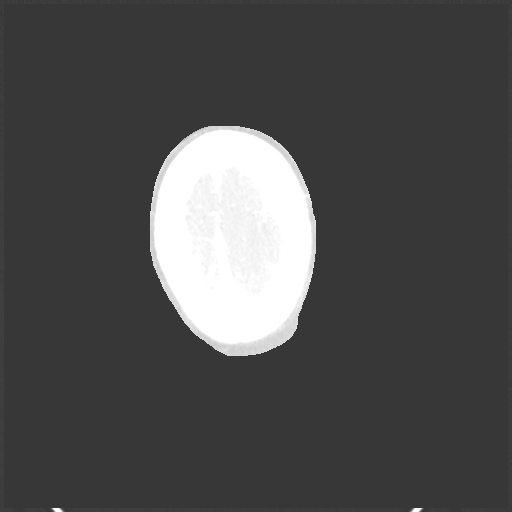

[13 of 46 positions shown; findings below may reference images not displayed]

FINDINGS: CTA NECK FINDINGS

Aortic arch: Atherosclerotic plaque.  Two vessel branching.

Right carotid system: Mild to moderate mainly calcified plaque at
the bifurcation/bulb. No flow limiting stenosis or ulceration.

Left carotid system: Atheromatous plaque of the distal common
carotid which is mild for age. No flow limiting stenosis or
ulceration.

Vertebral arteries: Proximal subclavian atherosclerosis on the left
more than right without flow limiting stenosis or ulceration.
Codominant vertebral arteries that show robust flow to the dura.

Skeleton: Degenerative changes without acute or aggressive finding.

Other neck: No evidence of mass or inflammation

Upper chest: Ground-glass and streaky opacity in the right more than
left lung. No opacification at the left atrial appendage.

Review of the MIP images confirms the above findings

CTA HEAD FINDINGS

Anterior circulation: Atherosclerotic plaque on the carotid siphons.
No branch occlusion or flow limiting stenosis. Negative for aneurysm

Posterior circulation: Vertebral and basilar arteries are smooth and
widely patent. No branch occlusion or aneurysm.

Venous sinuses: Unremarkable

Anatomic variants: None significant

Review of the MIP images confirms the above findings
IMPRESSION: 1. No emergent vascular finding
2. Atherosclerosis without flow limiting stenosis or ulceration in
the head and neck.
3. Non opacified left atrial appendage in the setting of atrial
fibrillation that could be delayed filling versus thrombus.
4. Right more than left pneumonia and atelectasis.

## 2021-10-23 ENCOUNTER — Other Ambulatory Visit
Admission: RE | Admit: 2021-10-23 | Discharge: 2021-10-23 | Disposition: A | Discharge status: Home / Self Care | Payer: Medicare Other | Source: Ambulatory Visit | Attending: Physician Assistant | Admitting: Physician Assistant

## 2021-10-23 DIAGNOSIS — R0689 Other abnormalities of breathing: Secondary | ICD-10-CM | POA: Insufficient documentation

## 2021-10-23 DIAGNOSIS — Z79899 Other long term (current) drug therapy: Secondary | ICD-10-CM | POA: Insufficient documentation

## 2021-10-23 LAB — DIGOXIN LEVEL: Digoxin Level: 1.3 ng/mL (ref 0.8–2.0)

## 2022-06-04 ENCOUNTER — Observation Stay: Payer: Medicare PPO

## 2022-06-04 ENCOUNTER — Emergency Department: Payer: Medicare PPO

## 2022-06-04 ENCOUNTER — Encounter: Payer: Self-pay | Admitting: Internal Medicine

## 2022-06-04 ENCOUNTER — Observation Stay
Admission: EM | Admit: 2022-06-04 | Discharge: 2022-06-05 | Disposition: A | Discharge status: Home / Self Care | Payer: Medicare PPO | Attending: Student | Admitting: Student

## 2022-06-04 ENCOUNTER — Other Ambulatory Visit: Payer: Self-pay

## 2022-06-04 DIAGNOSIS — R2681 Unsteadiness on feet: Secondary | ICD-10-CM | POA: Insufficient documentation

## 2022-06-04 DIAGNOSIS — Z8673 Personal history of transient ischemic attack (TIA), and cerebral infarction without residual deficits: Secondary | ICD-10-CM | POA: Diagnosis not present

## 2022-06-04 DIAGNOSIS — Z79899 Other long term (current) drug therapy: Secondary | ICD-10-CM | POA: Insufficient documentation

## 2022-06-04 DIAGNOSIS — M6281 Muscle weakness (generalized): Secondary | ICD-10-CM | POA: Insufficient documentation

## 2022-06-04 DIAGNOSIS — R262 Difficulty in walking, not elsewhere classified: Secondary | ICD-10-CM | POA: Insufficient documentation

## 2022-06-04 DIAGNOSIS — R4781 Slurred speech: Principal | ICD-10-CM | POA: Insufficient documentation

## 2022-06-04 DIAGNOSIS — E7800 Pure hypercholesterolemia, unspecified: Secondary | ICD-10-CM | POA: Diagnosis present

## 2022-06-04 DIAGNOSIS — Z7901 Long term (current) use of anticoagulants: Secondary | ICD-10-CM | POA: Diagnosis not present

## 2022-06-04 DIAGNOSIS — I10 Essential (primary) hypertension: Secondary | ICD-10-CM | POA: Diagnosis present

## 2022-06-04 DIAGNOSIS — E039 Hypothyroidism, unspecified: Secondary | ICD-10-CM | POA: Diagnosis not present

## 2022-06-04 DIAGNOSIS — R4789 Other speech disturbances: Secondary | ICD-10-CM | POA: Diagnosis not present

## 2022-06-04 DIAGNOSIS — I4819 Other persistent atrial fibrillation: Secondary | ICD-10-CM | POA: Diagnosis present

## 2022-06-04 DIAGNOSIS — I739 Peripheral vascular disease, unspecified: Secondary | ICD-10-CM | POA: Diagnosis present

## 2022-06-04 DIAGNOSIS — R4182 Altered mental status, unspecified: Secondary | ICD-10-CM

## 2022-06-04 LAB — COMPREHENSIVE METABOLIC PANEL
ALT: 25 U/L (ref 0–44)
AST: 26 U/L (ref 15–41)
Albumin: 3.8 g/dL (ref 3.5–5.0)
Alkaline Phosphatase: 112 U/L (ref 38–126)
Anion gap: 7 (ref 5–15)
BUN: 18 mg/dL (ref 8–23)
CO2: 30 mmol/L (ref 22–32)
Calcium: 9.9 mg/dL (ref 8.9–10.3)
Chloride: 103 mmol/L (ref 98–111)
Creatinine, Ser: 0.97 mg/dL (ref 0.44–1.00)
GFR, Estimated: 56 mL/min — ABNORMAL LOW (ref >60)
Glucose, Bld: 105 mg/dL — ABNORMAL HIGH (ref 70–99)
Potassium: 4.4 mmol/L (ref 3.5–5.1)
Sodium: 140 mmol/L (ref 135–145)
Total Bilirubin: 0.8 mg/dL (ref 0.3–1.2)
Total Protein: 7.6 g/dL (ref 6.5–8.1)

## 2022-06-04 LAB — CBC
HCT: 46 % (ref 36.0–46.0)
Hemoglobin: 14.4 g/dL (ref 12.0–15.0)
MCH: 30.1 pg (ref 26.0–34.0)
MCHC: 31.3 g/dL (ref 30.0–36.0)
MCV: 96.2 fL (ref 80.0–100.0)
Platelets: 327 10*3/uL (ref 150–400)
RBC: 4.78 MIL/uL (ref 3.87–5.11)
RDW: 13.8 % (ref 11.5–15.5)
WBC: 8.2 10*3/uL (ref 4.0–10.5)
nRBC: 0 % (ref 0.0–0.2)

## 2022-06-04 LAB — DIFFERENTIAL
Abs Immature Granulocytes: 0.06 10*3/uL (ref 0.00–0.07)
Basophils Absolute: 0 10*3/uL (ref 0.0–0.1)
Basophils Relative: 0 %
Eosinophils Absolute: 0.2 10*3/uL (ref 0.0–0.5)
Eosinophils Relative: 2 %
Immature Granulocytes: 1 %
Lymphocytes Relative: 25 %
Lymphs Abs: 2 10*3/uL (ref 0.7–4.0)
Monocytes Absolute: 0.8 10*3/uL (ref 0.1–1.0)
Monocytes Relative: 9 %
Neutro Abs: 5.2 10*3/uL (ref 1.7–7.7)
Neutrophils Relative %: 63 %

## 2022-06-04 LAB — ETHANOL: Alcohol, Ethyl (B): <10 mg/dL (ref <10)

## 2022-06-04 LAB — PROTIME-INR
INR: 1.3 — ABNORMAL HIGH (ref 0.8–1.2)
Prothrombin Time: 16.1 seconds — ABNORMAL HIGH (ref 11.4–15.2)

## 2022-06-04 LAB — APTT: aPTT: 34 seconds (ref 24–36)

## 2022-06-04 LAB — HEMOGLOBIN A1C
Hgb A1c MFr Bld: 5.8 % — ABNORMAL HIGH (ref 4.8–5.6)
Mean Plasma Glucose: 119.76 mg/dL

## 2022-06-04 LAB — TROPONIN I (HIGH SENSITIVITY)
Troponin I (High Sensitivity): 12 ng/L (ref <18)
Troponin I (High Sensitivity): 12 ng/L (ref <18)

## 2022-06-04 LAB — CBG MONITORING, ED
Glucose-Capillary: 99 mg/dL (ref 70–99)
Glucose-Capillary: 83 mg/dL (ref 70–99)

## 2022-06-04 MED ORDER — STROKE: EARLY STAGES OF RECOVERY BOOK: Freq: Once | Status: DC

## 2022-06-04 MED ORDER — ACETAMINOPHEN 650 MG RE SUPP: 650.0000 mg | RECTAL | Status: DC | PRN

## 2022-06-04 MED ORDER — APIXABAN 5 MG PO TABS
5.0000 mg | ORAL_TABLET | Freq: Two times a day (BID) | ORAL | Status: DC
  Administered 2022-06-04 – 2022-06-05 (×2): 5 mg via ORAL
  Filled 2022-06-04 (×2): qty 1

## 2022-06-04 MED ORDER — SENNOSIDES-DOCUSATE SODIUM 8.6-50 MG PO TABS: 1.0000 | ORAL_TABLET | Freq: Every evening | ORAL | Status: DC | PRN

## 2022-06-04 MED ORDER — ATORVASTATIN CALCIUM 20 MG PO TABS: 80.0000 mg | ORAL_TABLET | Freq: Every day | ORAL | Status: DC

## 2022-06-04 MED ORDER — HYDRALAZINE HCL 20 MG/ML IJ SOLN: 5.0000 mg | Freq: Three times a day (TID) | INTRAMUSCULAR | Status: DC | PRN

## 2022-06-04 MED ORDER — DIGOXIN 125 MCG PO TABS
62.5000 ug | ORAL_TABLET | Freq: Every day | ORAL | Status: DC
  Administered 2022-06-04 – 2022-06-05 (×2): 62.5 ug via ORAL
  Filled 2022-06-04 (×2): qty 0.5

## 2022-06-04 MED ORDER — ASPIRIN 325 MG PO TBEC: 325.0000 mg | DELAYED_RELEASE_TABLET | Freq: Every day | ORAL | Status: DC

## 2022-06-04 MED ORDER — SODIUM CHLORIDE 0.9% FLUSH: 3.0000 mL | Freq: Once | INTRAVENOUS | Status: DC

## 2022-06-04 MED ORDER — IOHEXOL 350 MG/ML SOLN
100.0000 mL | Freq: Once | INTRAVENOUS | Status: AC | PRN
  Administered 2022-06-04: 100 mL via INTRAVENOUS

## 2022-06-04 MED ORDER — METOPROLOL SUCCINATE ER 50 MG PO TB24
100.0000 mg | ORAL_TABLET | Freq: Every day | ORAL | Status: DC
  Administered 2022-06-05: 100 mg via ORAL
  Filled 2022-06-04: qty 2

## 2022-06-04 MED ORDER — ONDANSETRON HCL 4 MG/2ML IJ SOLN: 4.0000 mg | Freq: Four times a day (QID) | INTRAMUSCULAR | Status: DC | PRN

## 2022-06-04 MED ORDER — LABETALOL HCL 5 MG/ML IV SOLN: 5.0000 mg | INTRAVENOUS | Status: DC | PRN

## 2022-06-04 MED ORDER — ENOXAPARIN SODIUM 40 MG/0.4ML IJ SOSY: 40.0000 mg | PREFILLED_SYRINGE | INTRAMUSCULAR | Status: DC

## 2022-06-04 MED ORDER — ACETAMINOPHEN 160 MG/5ML PO SOLN: 650.0000 mg | ORAL | Status: DC | PRN

## 2022-06-04 MED ORDER — PANTOPRAZOLE SODIUM 40 MG PO TBEC
40.0000 mg | DELAYED_RELEASE_TABLET | Freq: Every day | ORAL | Status: DC
  Administered 2022-06-05: 40 mg via ORAL
  Filled 2022-06-04: qty 1

## 2022-06-04 MED ORDER — LEVOTHYROXINE SODIUM 88 MCG PO TABS
88.0000 ug | ORAL_TABLET | Freq: Every day | ORAL | Status: DC
  Administered 2022-06-05: 88 ug via ORAL
  Filled 2022-06-04 (×2): qty 1

## 2022-06-04 MED ORDER — ONDANSETRON HCL 4 MG PO TABS: 4.0000 mg | ORAL_TABLET | Freq: Four times a day (QID) | ORAL | Status: DC | PRN

## 2022-06-04 MED ORDER — ASPIRIN 81 MG PO CHEW
324.0000 mg | CHEWABLE_TABLET | Freq: Once | ORAL | Status: AC
  Administered 2022-06-04: 324 mg via ORAL
  Filled 2022-06-04: qty 4

## 2022-06-04 MED ORDER — ACETAMINOPHEN 325 MG PO TABS: 650.0000 mg | ORAL_TABLET | ORAL | Status: DC | PRN

## 2022-06-04 NOTE — Code Documentation (Signed)
Stroke Response Nurse Documentation Code Documentation  Courtney Chan is a 89 y.o. female arriving to [facility] via [facility] on 06/04/2022 with past medical hx of HTN, a-fib, thyroid disease, HLD, previous stroke. On No antithrombotic. Code stroke was activated by ED.   Patient from cardiology appointment earlier in the day where she became con confused and had slurred speech that lasted around 5 minutes and was brought to the ED by daughter. Symptoms had resolved by the time she arrived to the ED. Patient was noted by daughter to have new onset garbled speech again. LKW 1445 and a code stroke was called.   Neurologist with patient when code stroke was called. SRN met patient in CT with care team  Patient already evaluated by EDP. NIHSS 6, see documentation for details and code stroke times. Patient with disoriented, left facial droop, left arm weakness, left leg weakness, and Expressive aphasia  on exam. The following imaging was completed:  CTA and CTP. Patient is not a candidate for IV Thrombolytic due to recent stroke, per MD. Patient is not a candidate for IR due to no LVO seen on imaging, per MD.   Care Plan: q2h NIHSS, vital signs.   Bedside handoff with ED RN Jenny Reichmann.    Charise Carwin  Stroke Response RN

## 2022-06-04 NOTE — ED Notes (Signed)
Meal tray delivered by FNS

## 2022-06-04 NOTE — Progress Notes (Signed)
   06/04/22 1537  Spiritual Encounters  Type of Visit Initial  Care provided to: Family  Referral source Nurse (RN/NT/LPN)  Reason for visit Code  OnCall Visit Yes   Chaplain responded to code stroke. Patient taken to scans. Chaplain provided compassionate presence and reflective listening with husband and daughter. New Chaplain Saralyn Pilar present. Chaplain showed family the consult room so they could wait comfortably for patient to return. Chaplain services are available for follow up as needed.

## 2022-06-04 NOTE — Assessment & Plan Note (Addendum)
-  Levothyroxine 88 mcg daily resumed 

## 2022-06-04 NOTE — Progress Notes (Signed)
OT Cancellation Note  Patient Details Name: Courtney Chan MRN: [id number] DOB: [birth year]   Cancelled Treatment:    Reason Eval/Treat Not Completed: Fatigue/lethargy limiting ability to participate;Other (comment). OT orders received, chart reviewed. Pt supine in bed with Daughter and spouse at bedside. Pt reported having an episode of emesis after MRI and deferred getting OOB at this time. Requested that OT come back tomorrow.  Doneta Public 06/04/2022, 5:35 PM

## 2022-06-04 NOTE — Assessment & Plan Note (Addendum)
-  Patient takes Eliquis 5 mg p.o. twice daily, this has been resumed - Resumed home digoxin 62.5 mcg daily, metoprolol 100 mg daily were resumed

## 2022-06-04 NOTE — ED Notes (Signed)
Family member reports patient talking "Gibberish". On assessment pt was only oriented to name, also mild left sided weakness noted, Dr Tobie Poet informed. Dr Drenda Freeze at bedside 1445. Code stroke activated.

## 2022-06-04 NOTE — Consult Note (Signed)
Neurology Consultation Reason for Consult: Stroke Referring Physician: Cox, a  CC: Garbled speech  History is obtained from: Patient, daughter  HPI: Courtney Chan is a 89 y.o. female with a history of hypertension, hyperlipidemia, atrial fibrillation not on anticoagulation who presents with garbled speech that was noticed at cardiology today.  She typically lives with her husband, but her daughter has been with her every day, and has not noticed any changes over the past few weeks.  She was speaking "gibberish."  By the time of arrival to [facility], however, she had returned to her baseline and was speaking normally with until 2:45 PM at which point she began having difficulty speaking once again.  As part of her evaluation she had a head CT which shows a subacute right MCA branch stroke.  LKW: 2:45 PM tnk given?: no, recent stroke   Past Medical History:  Diagnosis Date   Atrial fibrillation ([place]) 2011   Hyperlipidemia    Hypertension    Stroke [facility])    Thyroid disease      Family History  Problem Relation Age of Onset   Breast cancer Maternal Grandmother        60's     Social History:  reports that she has never smoked. She has never used smokeless tobacco. She reports that she does not drink alcohol and does not use drugs.   Exam: Current vital signs: BP (!) 180/99 (BP Location: Right Arm)   Pulse 71   Temp 98.4 F (36.9 C)   Resp 19   Wt 63.6 kg   SpO2 97%   BMI 24.07 kg/m  Vital signs in last 24 hours: Temp:  [98.4 F (36.9 C)] 98.4 F (36.9 C) (01/22 1137) Pulse Rate:  [67-77] 71 (01/22 1450) Resp:  [15-19] 19 (01/22 1450) BP: (141-180)/(68-99) 180/99 (01/22 1450) SpO2:  [94 %-98 %] 97 % (01/22 1450) Weight:  [63.6 kg] 63.6 kg (01/22 1138)   Physical Exam  Appears well-developed and well-nourished.   Neuro: Mental Status: Patient is awake, alert, she gives the month is February, unable to give age. She repeatedly asked for her husband, despite  repeated reassurances that she is just getting a quick scan.  She does not seem to register that he cannot be there. No signs of aphasia or neglect Cranial Nerves: II: Visual Fields are full. Pupils are equal, round, and reactive to light.   III,IV, VI: EOMI without ptosis or diploplia.  V: Facial sensation is symmetric to temperature VII: Facial movement with mild left facial weakness.  VIII: hearing is intact to voice X: Uvula elevates symmetrically XI: Shoulder shrug is symmetric. XII: tongue is midline without atrophy or fasciculations.  Motor: She has mild weakness to confrontation of the left arm and left leg with only very minor drift. Sensory: Sensation is symmetric to light touch and temperature in the arms and legs. Cerebellar: No ataxia   I have reviewed labs in epic and the results pertinent to this consultation are: Results for orders placed or performed during the hospital encounter of 06/04/22 (from the past 24 hour(s))  CBG monitoring, ED     Status: None   Collection Time: 06/04/22 11:37 AM  Result Value Ref Range   Glucose-Capillary 99 70 - 99 mg/dL  Protime-INR     Status: Abnormal   Collection Time: 06/04/22 11:49 AM  Result Value Ref Range   Prothrombin Time 16.1 (H) 11.4 - 15.2 seconds   INR 1.3 (H) 0.8 - 1.2  APTT  Status: None   Collection Time: 06/04/22 11:49 AM  Result Value Ref Range   aPTT 34 24 - 36 seconds  CBC     Status: None   Collection Time: 06/04/22 11:49 AM  Result Value Ref Range   WBC 8.2 4.0 - 10.5 K/uL   RBC 4.78 3.87 - 5.11 MIL/uL   Hemoglobin 14.4 12.0 - 15.0 g/dL   HCT 30.1 60.1 - 09.3 %   MCV 96.2 80.0 - 100.0 fL   MCH 30.1 26.0 - 34.0 pg   MCHC 31.3 30.0 - 36.0 g/dL   RDW 23.5 57.3 - 22.0 %   Platelets 327 150 - 400 K/uL   nRBC 0.0 0.0 - 0.2 %  Differential     Status: None   Collection Time: 06/04/22 11:49 AM  Result Value Ref Range   Neutrophils Relative % 63 %   Neutro Abs 5.2 1.7 - 7.7 K/uL   Lymphocytes Relative  25 %   Lymphs Abs 2.0 0.7 - 4.0 K/uL   Monocytes Relative 9 %   Monocytes Absolute 0.8 0.1 - 1.0 K/uL   Eosinophils Relative 2 %   Eosinophils Absolute 0.2 0.0 - 0.5 K/uL   Basophils Relative 0 %   Basophils Absolute 0.0 0.0 - 0.1 K/uL   Immature Granulocytes 1 %   Abs Immature Granulocytes 0.06 0.00 - 0.07 K/uL  Comprehensive metabolic panel     Status: Abnormal   Collection Time: 06/04/22 11:49 AM  Result Value Ref Range   Sodium 140 135 - 145 mmol/L   Potassium 4.4 3.5 - 5.1 mmol/L   Chloride 103 98 - 111 mmol/L   CO2 30 22 - 32 mmol/L   Glucose, Bld 105 (H) 70 - 99 mg/dL   BUN 18 8 - 23 mg/dL   Creatinine, Ser 2.54 0.44 - 1.00 mg/dL   Calcium 9.9 8.9 - 27.0 mg/dL   Total Protein 7.6 6.5 - 8.1 g/dL   Albumin 3.8 3.5 - 5.0 g/dL   AST 26 15 - 41 U/L   ALT 25 0 - 44 U/L   Alkaline Phosphatase 112 38 - 126 U/L   Total Bilirubin 0.8 0.3 - 1.2 mg/dL   GFR, Estimated 56 (L) >60 mL/min   Anion gap 7 5 - 15  Ethanol     Status: None   Collection Time: 06/04/22 11:49 AM  Result Value Ref Range   Alcohol, Ethyl (B) <10 <10 mg/dL  Troponin I (High Sensitivity)     Status: None   Collection Time: 06/04/22 11:49 AM  Result Value Ref Range   Troponin I (High Sensitivity) 12 <18 ng/L  Troponin I (High Sensitivity)     Status: None   Collection Time: 06/04/22  1:54 PM  Result Value Ref Range   Troponin I (High Sensitivity) 12 <18 ng/L  CBG monitoring, ED     Status: None   Collection Time: 06/04/22  2:46 PM  Result Value Ref Range   Glucose-Capillary 83 70 - 99 mg/dL     I have reviewed the images obtained: CT/CTA/CTP-subacute stroke in the right frontal region without corresponding ischemia on CTP  Impression: 89 year old female with subacute right frontal stroke now presenting with aphasia.  I am concerned that she has had a new left-sided stroke, the right-sided one seen on CT does not correspond to her symptoms.  Given that we do not know when the right-sided stroke occurred  and it appears subacute, this would be a contraindication to tenecteplase given it  is presumed to have happened within the past 3 months based on CT appearance.  It is definitely new since October 2024.  Recommendations: - HgbA1c, fasting lipid panel - MRI of the brain without contrast - Frequent neuro checks - Echocardiogram - Prophylactic therapy-Antiplatelet med: Aspirin -325 mg daily - Risk factor modification - Telemetry monitoring - PT consult, OT consult, Speech consult  Roland Rack, MD [facility] [phone]  If 7pm- 7am, please page neurology on call as listed in AMION.

## 2022-06-04 NOTE — Progress Notes (Signed)
CODE STROKE- PHARMACY COMMUNICATION   Time CODE STROKE called/page received:1458  Time response to CODE STROKE was made (in person or via phone): 1505  Time Stroke Kit retrieved from [place] (only if needed):N/A  Name of Provider/Nurse contacted:Dr. Leonel Ramsay  Past Medical History:  Diagnosis Date   Atrial fibrillation ([place]) 2011   Hyperlipidemia    Hypertension    Stroke [facility])    Thyroid disease    Prior to Admission medications   Medication Sig Start Date End Date Taking? Authorizing Provider  atorvastatin (LIPITOR) 80 MG tablet Take 1 tablet (80 mg total) by mouth daily at 6 PM. Patient taking differently: Take 40 mg by mouth daily at 6 PM. 07/10/19  Yes Angiulli, Lavon Paganini, PA-C  ELIQUIS 5 MG TABS tablet Take 5 mg by mouth 2 (two) times daily.   Yes [provider]  LANOXIN 62.5 MCG TABS  09/14/19  Yes [provider]  levothyroxine (SYNTHROID) 88 MCG tablet Take 1 tablet (88 mcg total) by mouth daily. 07/10/19  Yes Angiulli, Lavon Paganini, PA-C  metoprolol succinate (TOPROL-XL) 50 MG 24 hr tablet Take 3 tablets (150 mg total) by mouth daily. Take with or immediately following a meal. Patient taking differently: Take 100 mg by mouth daily. Take with or immediately following a meal. 07/11/19  Yes Angiulli, Lavon Paganini, PA-C  alendronate (FOSAMAX) 70 MG tablet Take 70 mg by mouth once a week. Take with a full glass of water on an empty stomach. Patient not taking: Reported on 06/04/2022    [provider]  alendronate (FOSAMAX) 70 MG tablet Take by mouth. Patient not taking: Reported on 06/04/2022 08/31/19   [provider]  clopidogrel (PLAVIX) 75 MG tablet Take 1 tablet (75 mg total) by mouth daily. Patient not taking: Reported on 06/04/2022 07/10/19   Cathlyn Parsons, PA-C  clopidogrel (PLAVIX) 75 MG tablet Take by mouth. Patient not taking: Reported on 06/04/2022 09/07/19   [provider]  losartan (COZAAR) 25 MG tablet Take 0.5 tablets (12.5 mg  total) by mouth daily. Patient not taking: Reported on 06/04/2022 07/10/19   Angiulli, Lavon Paganini, PA-C  omeprazole (PRILOSEC) 20 MG capsule 20 mg.  Patient not taking: Reported on 06/04/2022 08/27/19   [provider]  pantoprazole (PROTONIX) 40 MG tablet Take 1 tablet (40 mg total) by mouth daily. 04/04/20 04/04/21  Nena Polio, MD    Lorin Picket ,PharmD Clinical Pharmacist  06/04/2022  3:07 PM

## 2022-06-04 NOTE — Assessment & Plan Note (Addendum)
-  Home antihypertensive, metoprolol succinate 100 mg daily resumed on admission - As needed antihypertensive medications ordered

## 2022-06-04 NOTE — ED Notes (Signed)
Pt incontinent of urine. Pt cleaned with peri wipes, and placed into clean gown. Bed linen changed at this time and warm blankets was placed on pt. Pt has no further needs at this time. Dtr at bedside.

## 2022-06-04 NOTE — ED Triage Notes (Signed)
Pt to ED via POV with daughter who states that pt was leaving cardiology appt and she became confused and then her speech was slurred, episode lasted approximately 5 minutes. Pt daughter states that patient seems back to base line now. Pt has history of CVA 1 year ago. Pts speech is normal. Grips are equal bilaterally. Pt is A & O x 3. Per daughter pt has mild-moderate dementia.

## 2022-06-04 NOTE — H&P (Addendum)
History and Physical   Courtney Chan:[id number] DOB: 09-23-1930 DOA: 06/04/2022  PCP: Courtney Ivan, MD  Patient coming from: Outpatient cardiology clinic via POV  I have personally briefly reviewed patient's old medical records in [facility] EMR.  Chief Concern: Speech difficulty and confusion  HPI: [name] is an 89 year old female with history of moderate dementia, permanent atrial fibrillation, history of stroke, frequent UTIs, who presents emergency department for chief concerns of confusion and slurred speech.  Initial vitals in the emergency department showed temperature of 98.4, respiration rate of 16, heart rate of 77, blood pressure 141/70, SpO2 of 95% on room air.  Serum sodium is 140, potassium 4.4, chloride 103, bicarb 30, BUN of 18, serum creatinine of 0.97, EGFR 56, nonfasting blood glucose 105, WBC 8.2, hemoglobin 14.4, platelets of 327.  High sensitive troponin was ordered and initial value was 12.  CT of the head without contrast: Was read as new moderate size area of low-attenuation in the right frontal temporal region most compatible with a subacute right MCA territory infarct.  No evidence of hemorrhage.  Chronic microvascular ischemic change and cerebral volume loss.  ED treatment: Aspirin 324 mg p.o. one-time dose. ---------------------- At bedside, patient was able to tell me her full name.  She was not able to tell me her age, the current calendar year, or the current month.  She was able to tell me that she was in the hospital.  She was not able to identify her daughter correctly at bedside.  She states that its her sister at bedside.  Daughter reports that they were leaving the cardiologist regular clinic appointments when patient was noted to have difficulties speaking and confusion.  This lasted approximately 5 minutes.  Daughter denies patient's difficulty ambulating.  At bedside, patient denies chest pain, shortness of breath, abdominal  pain, dysuria, hematuria.  Post admission, patient was noted to have increased confusion expressive aphasia.  Neurology at bedside ordered CTA head and neck.  MRI brain without contrast has been ordered on admission.  Social history: She lives at home with her husband.  She denies tobacco, EtOH, recreational drug use.  ROS: Unable to fully complete as patient dementia  ED Course: Discussed with emergency medicine provider, patient requiring hospitalization for chief concerns of TIA versus stroke.  Assessment/Plan  Principal Problem:   Garbled speech Active Problems:   Persistent atrial fibrillation (HCC)   Acquired hypothyroidism   Essential hypertension   History of TIA (transient ischemic attack)   Pure hypercholesterolemia   PAD (peripheral artery disease) (HCC)   Hypothyroidism   Assessment and Plan:  * Garbled speech Stroke-like symptoms Subacute right MCA stroke - Neurology has been consulted and we appreciate further recommendations - Complete echo and MRI of the brain ordered - Fasting lipid and A1c ordered - Permissive hypertension: Labetalol 5 mg IV every 3 hours as needed for SBP greater than 190, hydralazine 5 mg IV every 8 hours as needed for SBP greater than 190 - Frequent neuro vascular checks - N.p.o. pending swallow study - PT, O - Fall precaution   Hypothyroidism - Levothyroxine 88 mcg daily resumed  Pure hypercholesterolemia - Atorvastatin 80 mg daily resumed  Essential hypertension - Home antihypertensive, metoprolol succinate 100 mg daily resumed on admission - As needed antihypertensive medications ordered  Persistent atrial fibrillation (HCC) - Patient takes Eliquis 5 mg p.o. twice daily, this has been resumed - Resumed home digoxin 62.5 mcg daily, metoprolol 100 mg daily were resumed  Chart  reviewed.   DVT prophylaxis: Eliquis Code Status: DNR, daughter confirmed that there is DNR documentations at home and she will bring in  tomorrow Diet: N.p.o. except for sips of meds and ice chips pending SLP evaluation Family Communication: Daughter at bedside Disposition Plan: Pending clinical course Consults called: Neurology Admission status: Telemetry medical, observation  Past Medical History:  Diagnosis Date   Atrial fibrillation ([place]) 2011   Hyperlipidemia    Hypertension    Stroke [facility])    Thyroid disease    Past Surgical History:  Procedure Laterality Date   ABDOMINAL HYSTERECTOMY     BREAST BIOPSY Left 1980   Negative   CORONARY/GRAFT ACUTE MI REVASCULARIZATION N/A 06/28/2019   Procedure: Coronary/Graft Acute MI Revascularization;  Surgeon: Leonie Man, MD;  Location: [place] [facility];  Service: Cardiovascular;  Laterality: N/A;   LEFT HEART CATH AND CORONARY ANGIOGRAPHY N/A 06/28/2019   Procedure: LEFT HEART CATH AND CORONARY ANGIOGRAPHY;  Surgeon: Leonie Man, MD;  Location: [place];  Service: Cardiovascular;  Laterality: N/A;   Social History:  reports that she has never smoked. She has never used smokeless tobacco. She reports that she does not drink alcohol and does not use drugs.  Allergies  Allergen Reactions   Metronidazole Diarrhea and Nausea And Vomiting   Oxycodone Other (See Comments)    Altered mental status   Family History  Problem Relation Age of Onset   Breast cancer Maternal Grandmother        69's   Family history: Family history reviewed and not pertinent.  Prior to Admission medications   Medication Sig Start Date End Date Taking? Authorizing Provider  alendronate (FOSAMAX) 70 MG tablet Take 70 mg by mouth once a week. Take with a full glass of water on an empty stomach.    [provider]  alendronate (FOSAMAX) 70 MG tablet Take by mouth. 08/31/19   [provider]  atorvastatin (LIPITOR) 80 MG tablet Take 1 tablet (80 mg total) by mouth daily at 6 PM. 07/10/19   Angiulli, Lavon Paganini, PA-C  clopidogrel (PLAVIX) 75 MG tablet Take 1  tablet (75 mg total) by mouth daily. 07/10/19   Angiulli, Lavon Paganini, PA-C  clopidogrel (PLAVIX) 75 MG tablet Take by mouth. 09/07/19   [provider]  LANOXIN 62.5 MCG TABS  09/14/19   [provider]  levothyroxine (SYNTHROID) 88 MCG tablet Take 1 tablet (88 mcg total) by mouth daily. 07/10/19   Angiulli, Lavon Paganini, PA-C  losartan (COZAAR) 25 MG tablet Take 0.5 tablets (12.5 mg total) by mouth daily. 07/10/19   Angiulli, Lavon Paganini, PA-C  metoprolol succinate (TOPROL-XL) 50 MG 24 hr tablet Take 3 tablets (150 mg total) by mouth daily. Take with or immediately following a meal. 07/11/19   Angiulli, Lavon Paganini, PA-C  omeprazole (PRILOSEC) 20 MG capsule 20 mg.  08/27/19   [provider]  pantoprazole (PROTONIX) 40 MG tablet Take 1 tablet (40 mg total) by mouth daily. 04/04/20 04/04/21  Nena Polio, MD   Physical Exam: Vitals:   06/04/22 1430 06/04/22 1438 06/04/22 1450 06/04/22 1833  BP:  (!) 171/74 (!) 180/99 (!) 153/68  Pulse:  67 71 70  Resp:  17 19 20   Temp: 98.6 F (37 C)     SpO2:  98% 97% 96%  Weight:       Constitutional: appears age-appropriate, frail, NAD, calm Eyes: PERRL, lids and conjunctivae normal ENMT: Mucous membranes are moist. Posterior pharynx clear of  any exudate or lesions. Age-appropriate dentition. Hearing appropriate Neck: normal, supple, no masses, no thyromegaly Respiratory: clear to auscultation bilaterally, no wheezing, no crackles. Normal respiratory effort. No accessory muscle use.  Cardiovascular: Regular rate and rhythm, no murmurs / rubs / gallops. No extremity edema. 2+ pedal pulses. No carotid bruits.  Abdomen: no tenderness, no masses palpated, no hepatosplenomegaly. Bowel sounds positive.  Musculoskeletal: no clubbing / cyanosis. No joint deformity upper and lower extremities. Good ROM, no contractures, no atrophy. Normal muscle tone.  Skin: no rashes, lesions, ulcers. No induration Neurologic: Sensation intact. Strength 5/5 in all  4.  Psychiatric: Unable to assess judgment and insight. Alert and oriented x self and location. Normal mood.   EKG: independently reviewed, showing atrial fibrillation with rate of 83, QTc 425  Chest x-ray on Admission: I personally reviewed and I agree with radiologist reading as below.  MR BRAIN WO CONTRAST  Result Date: 06/04/2022 CLINICAL DATA:  Stroke, follow up.  Slurred speech. EXAM: MRI HEAD WITHOUT CONTRAST TECHNIQUE: Multiplanar, multiecho pulse sequences of the brain and surrounding structures were obtained without intravenous contrast. COMPARISON:  Head CT and CTA 06/04/2022.  Head MRI 06/30/2019. FINDINGS: Brain: The moderate-sized right MCA infarct seen on CT involving the insula and frontal operculum is late subacute to chronic. A small chronic infarct at the posterior aspect of the right insula is unchanged from the prior MRI. No acute infarct, intracranial hemorrhage, mass, midline shift, or extra-axial fluid collection is identified. T2 hyperintensities elsewhere in the cerebral white matter bilaterally have slightly progressed from the prior MRI and are nonspecific but compatible with mild chronic small vessel ischemic disease. There is moderate cerebral atrophy. Vascular: Major intracranial vascular flow voids are preserved. Skull and upper cervical spine: Unremarkable bone marrow signal. Sinuses/Orbits: Bilateral cataract extraction. Paranasal sinuses and mastoid air cells are clear. Other: None. IMPRESSION: 1. No acute intracranial abnormality. 2. Late subacute to chronic right MCA infarct. 3. Mild chronic small vessel ischemic disease and moderate cerebral atrophy. Electronically Signed   By: Logan Bores M.D.   On: 06/04/2022 16:22   CT ANGIO HEAD NECK W WO CM W PERF (CODE STROKE)  Result Date: 06/04/2022 CLINICAL DATA:  Stroke suspected EXAM: CT ANGIOGRAPHY HEAD AND NECK CT PERFUSION BRAIN TECHNIQUE: Multidetector CT imaging of the head and neck was performed using the standard  protocol during bolus administration of intravenous contrast. Multiplanar CT image reconstructions and MIPs were obtained to evaluate the vascular anatomy. Carotid stenosis measurements (when applicable) are obtained utilizing NASCET criteria, using the distal internal carotid diameter as the denominator. Multiphase CT imaging of the brain was performed following IV bolus contrast injection. Subsequent parametric perfusion maps were calculated using RAPID software. RADIATION DOSE REDUCTION: This exam was performed according to the departmental dose-optimization program which includes automated exposure control, adjustment of the mA and/or kV according to patient size and/or use of iterative reconstruction technique. CONTRAST:  163mL OMNIPAQUE IOHEXOL 350 MG/ML SOLN COMPARISON:  Same day CT Brain.  CT C Spine 02/14/21 FINDINGS: CT HEAD FINDINGS Please see same-day CT brain for intracranial findings. CTA NECK FINDINGS Aortic arch: 2 vessel aortic arch. Imaged portion shows no evidence of aneurysm or dissection. No significant stenosis of the major arch vessel origins. Right carotid system: Mild narrowing of the origin of the right ICA secondary to calcified atherosclerotic plaque. Left carotid system: No evidence of dissection, stenosis (50% or greater) or occlusion. Vertebral arteries: The distal V2 segments of bilateral vertebral arteries are incompletely assessed due to  combination of motion and streak artifact. Within this limitation, codominant system without evidence of significant stenosis or occlusion. Skeleton: Compared to CT cervical spine 02/14/21, there is a new compression deformity at C7. Other neck: None Upper chest: Ground-glass appearance of the bilateral upper lungs is favored to be secondary to expiratory phase of imaging. Review of the MIP images confirms the above findings CTA HEAD FINDINGS Anterior circulation: Assessment of the cavernous segments of bilateral ICAs is limited due to motion  artifact. There is likely an acute occlusion of a prefrontal branch of the right MCA (series 13, image 24). Posterior circulation: No significant stenosis, proximal occlusion, aneurysm, or vascular malformation. Venous sinuses: As permitted by contrast timing, patent. Anatomic variants: None Review of the MIP images confirms the above findings CT Brain Perfusion Findings: ASPECTS: 5 according to same day CT. CBF (<30%) Volume: 85mL Perfusion (Tmax>6.0s) volume: 40mL Mismatch Volume: 57mL Infarction Location:Right frontal lobe IMPRESSION: 1. No large vessel occlusion. Likely acute occlusion of a prefrontal branch of the right MCA. No perfusion abnormality per CT brain perfusion. 2. Approximate 40% narrowing at the origin of the right ICA. 3. Compared to CT cervical spine 02/14/21, there is a new compression deformity at C7. Electronically Signed   By: Lorenza Cambridge M.D.   On: 06/04/2022 15:50   CT HEAD CODE STROKE WO CONTRAST  Result Date: 06/04/2022 CLINICAL DATA:  Code stroke.  Slurred speech EXAM: CT HEAD WITHOUT CONTRAST TECHNIQUE: Contiguous axial images were obtained from the base of the skull through the vertex without intravenous contrast. RADIATION DOSE REDUCTION: This exam was performed according to the departmental dose-optimization program which includes automated exposure control, adjustment of the mA and/or kV according to patient size and/or use of iterative reconstruction technique. COMPARISON:  06/04/2022 FINDINGS: Brain: Redemonstrated area of hypodensity in the right frontal and anterior temporal lobe, in the right anterior MCA territory. No new hypodensity to suggest additional acute infarct. No evidence of acute hemorrhage, mass, mass effect, or midline shift. No hydrocephalus or extra-axial collection. Age-related cerebral volume loss. Vascular: No hyperdense vessel. Skull: Negative for fracture or focal lesion. Sinuses/Orbits: No acute finding. Status post bilateral lens replacements. Other:  The mastoid air cells are well aerated. ASPECTS [facility] Stroke Program Early CT Score) - Ganglionic level infarction (caudate, lentiform nuclei, internal capsule, insula, M1-M3 cortex): 4 - Supraganglionic infarction (M4-M6 cortex): 1 Total score (0-10 with 10 being normal): 5 IMPRESSION: 1. Redemonstrated hypodensity in the right frontal and anterior temporal lobe, in the right anterior MCA territory, compatible with an acute to subacute infarct. No new hypodensity to suggest additional acute infarct. 2. ASPECTS is 5. Imaging results were communicated on 06/04/2022 at 3:14 pm to provider Dr. Amada Jupiter via secure text paging. Electronically Signed   By: Wiliam Ke M.D.   On: 06/04/2022 15:18   CT HEAD WO CONTRAST  Result Date: 06/04/2022 CLINICAL DATA:  Transient ischemic attack (TIA) EXAM: CT HEAD WITHOUT CONTRAST TECHNIQUE: Contiguous axial images were obtained from the base of the skull through the vertex without intravenous contrast. RADIATION DOSE REDUCTION: This exam was performed according to the departmental dose-optimization program which includes automated exposure control, adjustment of the mA and/or kV according to patient size and/or use of iterative reconstruction technique. COMPARISON:  02/14/2021 FINDINGS: Brain: New moderate-sized area of decreased attenuation with loss of gray-white matter junction in the right frontotemporal region compatible with a subacute infarct. No evidence of hemorrhage. No hydrocephalus. No extra-axial collection. No mass lesion identified. Patchy low-density changes within  the periventricular and subcortical white matter compatible with chronic microvascular ischemic change. Mild-moderate diffuse cerebral volume loss. Vascular: Atherosclerotic calcifications involving the large vessels of the skull base. No unexpected hyperdense vessel. Skull: Normal. Negative for fracture or focal lesion. Sinuses/Orbits: No acute finding. Other: None. IMPRESSION: 1. New  moderate-sized area of low attenuation in the right frontotemporal region most compatible with a subacute right MCA territory infarct. No evidence of hemorrhage. 2. Chronic microvascular ischemic change and cerebral volume loss. These results were called by telephone at the time of interpretation on 06/04/2022 at 12:26 pm to provider [facility] , who verbally acknowledged these results. Electronically Signed   By: Duanne Guess D.O.   On: 06/04/2022 12:28    Labs on Admission: I have personally reviewed following labs  CBC: Recent Labs  Lab 06/04/22 1149  WBC 8.2  NEUTROABS 5.2  HGB 14.4  HCT 46.0  MCV 96.2  PLT 327   Basic Metabolic Panel: Recent Labs  Lab 06/04/22 1149  NA 140  K 4.4  CL 103  CO2 30  GLUCOSE 105*  BUN 18  CREATININE 0.97  CALCIUM 9.9   GFR: CrCl cannot be calculated (Unknown ideal weight.).  Liver Function Tests: Recent Labs  Lab 06/04/22 1149  AST 26  ALT 25  ALKPHOS 112  BILITOT 0.8  PROT 7.6  ALBUMIN 3.8   Coagulation Profile: Recent Labs  Lab 06/04/22 1149  INR 1.3*   CBG: Recent Labs  Lab 06/04/22 1137 06/04/22 1446  GLUCAP 99 83   Urine analysis:    Component Value Date/Time   COLORURINE YELLOW (A) 02/14/2021 2048   APPEARANCEUR CLOUDY (A) 02/14/2021 2048   APPEARANCEUR Clear 01/21/2019 0833   LABSPEC 1.020 02/14/2021 2048   LABSPEC 1.005 08/05/2011 1910   PHURINE 5.0 02/14/2021 2048   GLUCOSEU NEGATIVE 02/14/2021 2048   GLUCOSEU Negative 08/05/2011 1910   HGBUR SMALL (A) 02/14/2021 2048   BILIRUBINUR NEGATIVE 02/14/2021 2048   BILIRUBINUR Negative 01/21/2019 0833   BILIRUBINUR Negative 08/05/2011 1910   KETONESUR NEGATIVE 02/14/2021 2048   PROTEINUR NEGATIVE 02/14/2021 2048   NITRITE NEGATIVE 02/14/2021 2048   LEUKOCYTESUR LARGE (A) 02/14/2021 2048   LEUKOCYTESUR Negative 08/05/2011 1910   This document was prepared using Dragon Voice Recognition software and may include unintentional dictation errors.  Dr.  Sedalia Muta [facility]  If 7PM-7AM, please contact overnight-coverage provider If 7AM-7PM, please contact day coverage provider [email or web address]  06/04/2022, 7:03 PM

## 2022-06-04 NOTE — Assessment & Plan Note (Signed)
-  Atorvastatin 80 mg daily resumed

## 2022-06-04 NOTE — Hospital Course (Signed)
Ms. Rhyse Skowron is an 89 year old female with history of moderate dementia, permanent atrial fibrillation, history of stroke, frequent UTIs, who presents emergency department for chief concerns of confusion and slurred speech.  Initial vitals in the emergency department showed temperature of 98.4, respiration rate of 16, heart rate of 77, blood pressure 141/70, SpO2 of 95% on room air.  Serum sodium is 140, potassium 4.4, chloride 103, bicarb 30, BUN of 18, serum creatinine of 0.97, EGFR 56, nonfasting blood glucose 105, WBC 8.2, hemoglobin 14.4, platelets of 327.  High sensitive troponin was ordered and initial value was 12.  CT of the head without contrast: Was read as new moderate size area of low-attenuation in the right frontal temporal region most compatible with a subacute right MCA territory infarct.  No evidence of hemorrhage.  Chronic microvascular ischemic change and cerebral volume loss.  ED treatment: Aspirin 324 mg p.o. one-time dose.

## 2022-06-04 NOTE — Assessment & Plan Note (Addendum)
Stroke-like symptoms Subacute right MCA stroke - Neurology has been consulted and we appreciate further recommendations - Complete echo and MRI of the brain ordered - Fasting lipid and A1c ordered - Permissive hypertension: Labetalol 5 mg IV every 3 hours as needed for SBP greater than 190, hydralazine 5 mg IV every 8 hours as needed for SBP greater than 190 - Frequent neuro vascular checks - N.p.o. pending swallow study - PT, O - Fall precaution

## 2022-06-04 NOTE — Progress Notes (Signed)
   06/04/22 1500  Spiritual Encounters  Type of Visit Initial  Care provided to: Family  Referral source Code page  Reason for visit Code  Spiritual Framework  Presenting Themes Significant life change  Community/Connection Family  Family Stress Factors Major life changes  Interventions  Spiritual Care Interventions Made Narrative/life review;Compassionate presence  Intervention Outcomes  Outcomes Reduced isolation;Connected to spiritual community  Spiritual Care Plan  Spiritual Care Issues Still Outstanding No further spiritual care needs at this time (see row info)   The Patient was unavailable at the point of visit. However,  Chaplain connected with family (Husband and Daughter to the Patient). Actively listened to husband as he narrated life together with patient close to 30 years. Chap offered calming presence.

## 2022-06-04 NOTE — Procedures (Signed)
Will attempt eeg tomorrow

## 2022-06-04 NOTE — ED Provider Notes (Signed)
[facility] Provider Note    Event Date/Time   First MD Initiated Contact with Patient 06/04/22 1301     (approximate)   History   Altered Mental Status   HPI  Courtney Chan is a 89 y.o. female past medical history significant for atrial fibrillation on Eliquis, prior CVA, who presents to the emergency department following an episode of speaking gibberish.  Patient is here with her daughter at bedside who helps provide the history.  Patient was walking out from her office visit today when she had an episode of confusion and had speech abnormality with speaking gibberish.  States that she appeared weak and nauseous at that time.  Her symptoms resolved after approximately 15 to 20 minutes.  Feels that she is back to her normal state.  Does have a prior history of CVA in the past.  Denies any recent falls or trauma.     Physical Exam   Triage Vital Signs: ED Triage Vitals  Enc Vitals Group     BP 06/04/22 1137 (!) 141/70     Pulse Rate 06/04/22 1137 77     Resp 06/04/22 1137 16     Temp 06/04/22 1137 98.4 F (36.9 C)     Temp src --      SpO2 06/04/22 1137 95 %     Weight 06/04/22 1138 140 lb 3.2 oz (63.6 kg)     Height --      Head Circumference --      Peak Flow --      Pain Score 06/04/22 1138 0     Pain Loc --      Pain Edu? --      Excl. in [place]? --     Most recent vital signs: Vitals:   06/04/22 1438 06/04/22 1450  BP: (!) 171/74 (!) 180/99  Pulse: 67 71  Resp: 17 19  Temp:    SpO2: 98% 97%    Physical Exam Constitutional:      Appearance: She is well-developed.  HENT:     Head: Atraumatic.  Eyes:     Conjunctiva/sclera: Conjunctivae normal.  Cardiovascular:     Rate and Rhythm: Regular rhythm.  Pulmonary:     Effort: No respiratory distress.  Abdominal:     General: There is no distension.  Musculoskeletal:        General: Normal range of motion.     Cervical back: Normal range of motion.  Skin:    General: Skin is  warm.  Neurological:     Mental Status: She is alert. Mental status is at baseline.     IMPRESSION / MDM / ASSESSMENT AND PLAN / ED COURSE  I reviewed the triage vital signs and the nursing notes.  CVA, intracranial hemorrhage, TIA, electrolyte abnormality, urinary tract infection, reactivation of old CVA  On arrival to the emergency department patient had rapid improvement of her symptoms and is on anticoagulation, not a TNK candidate and NIH scale is less than 6, low suspicion for LVO.   EKG  I, Nathaniel Man, the attending physician, personally viewed and interpreted this ECG.   Rate: Normal  Rhythm: Atrial fibrillation  Axis: Normal  Intervals: Normal  ST&T Change: Nonspecific  N RADIOLOGY I independently reviewed imaging, my interpretation of imaging: CT scan of the head called from radiology with concern for subacute/acute stroke that was new from prior CT scan.  No signs of intracranial hemorrhage.  LABS (all labs ordered are listed, but  only abnormal results are displayed) Labs interpreted as -  No significant electrolyte abnormalities  Labs Reviewed  PROTIME-INR - Abnormal; Notable for the following components:      Result Value   Prothrombin Time 16.1 (*)    INR 1.3 (*)    All other components within normal limits  COMPREHENSIVE METABOLIC PANEL - Abnormal; Notable for the following components:   Glucose, Bld 105 (*)    GFR, Estimated 56 (*)    All other components within normal limits  APTT  CBC  DIFFERENTIAL  ETHANOL  URINALYSIS, ROUTINE W REFLEX MICROSCOPIC  HEMOGLOBIN A1C  LIPID PANEL  CBG MONITORING, ED  CBG MONITORING, ED  TROPONIN I (HIGH SENSITIVITY)  TROPONIN I (HIGH SENSITIVITY)    TREATMENT   Aspirin324  Clinical picture concerning for subacute/acute CVA, patient is back to her baseline.  Consulted and discussed the patient's case with hospitalist Dr. Tobie Poet who will admit the patient.   PROCEDURES:  Critical Care performed:  No  Procedures  Patient's presentation is most consistent with acute presentation with potential threat to life or bodily function.   MEDICATIONS ORDERED IN ED: Medications  sodium chloride flush (NS) 0.9 % injection 3 mL ( Intravenous Canceled Entry 06/04/22 1216)  levothyroxine (SYNTHROID) tablet 88 mcg (has no administration in time range)   stroke: early stages of recovery book ( Does not apply Not Given 06/04/22 1604)  acetaminophen (TYLENOL) tablet 650 mg (has no administration in time range)    Or  acetaminophen (TYLENOL) 160 MG/5ML solution 650 mg (has no administration in time range)    Or  acetaminophen (TYLENOL) suppository 650 mg (has no administration in time range)  senna-docusate (Senokot-S) tablet 1 tablet (has no administration in time range)  labetalol (NORMODYNE) injection 5 mg (has no administration in time range)  hydrALAZINE (APRESOLINE) injection 5 mg (has no administration in time range)  ondansetron (ZOFRAN) tablet 4 mg (has no administration in time range)    Or  ondansetron (ZOFRAN) injection 4 mg (has no administration in time range)  atorvastatin (LIPITOR) tablet 80 mg (0 mg Oral Hold 06/04/22 1702)  pantoprazole (PROTONIX) EC tablet 40 mg (has no administration in time range)  apixaban (ELIQUIS) tablet 5 mg (has no administration in time range)  aspirin chewable tablet 324 mg (324 mg Oral Given 06/04/22 1324)  iohexol (OMNIPAQUE) 350 MG/ML injection 100 mL (100 mLs Intravenous Contrast Given 06/04/22 1511)    FINAL CLINICAL IMPRESSION(S) / ED DIAGNOSES   Final diagnoses:  Altered mental status, unspecified altered mental status type  Slurred speech     Rx / DC Orders   ED Discharge Orders     None        Note:  This document was prepared using Dragon voice recognition software and may include unintentional dictation errors.   Nathaniel Man, MD 06/04/22 1704

## 2022-06-04 NOTE — ED Notes (Signed)
Pt refusing swallow screen at this time

## 2022-06-05 DIAGNOSIS — R4182 Altered mental status, unspecified: Secondary | ICD-10-CM | POA: Diagnosis not present

## 2022-06-05 DIAGNOSIS — R4789 Other speech disturbances: Secondary | ICD-10-CM | POA: Diagnosis not present

## 2022-06-05 LAB — LIPID PANEL
Cholesterol: 148 mg/dL (ref 0–200)
HDL: 32 mg/dL — ABNORMAL LOW (ref >40)
LDL Cholesterol: 93 mg/dL (ref 0–99)
Total CHOL/HDL Ratio: 4.6 RATIO
Triglycerides: 113 mg/dL (ref <150)
VLDL: 23 mg/dL (ref 0–40)

## 2022-06-05 LAB — URINALYSIS, ROUTINE W REFLEX MICROSCOPIC
Bilirubin Urine: NEGATIVE
Glucose, UA: NEGATIVE mg/dL
Hgb urine dipstick: NEGATIVE
Ketones, ur: NEGATIVE mg/dL
Leukocytes,Ua: NEGATIVE
Nitrite: NEGATIVE
Protein, ur: NEGATIVE mg/dL
Specific Gravity, Urine: 1.03 (ref 1.005–1.030)
pH: 6 (ref 5.0–8.0)

## 2022-06-05 LAB — TSH: TSH: 0.962 u[IU]/mL (ref 0.350–4.500)

## 2022-06-05 MED ORDER — ATORVASTATIN CALCIUM 20 MG PO TABS: 40.0000 mg | ORAL_TABLET | Freq: Every day | ORAL | Status: DC

## 2022-06-05 MED ORDER — ATORVASTATIN CALCIUM 40 MG PO TABS: 40.0000 mg | ORAL_TABLET | Freq: Every day | ORAL | 2 refills | Status: DC

## 2022-06-05 NOTE — Procedures (Signed)
History: 89 yo F with transient episodes of aphasia  Sedation: None  Technique: This EEG was acquired with electrodes placed according to the International 10-20 electrode system (including Fp1, Fp2, F3, F4, C3, C4, P3, P4, O1, O2, T3, T4, T5, T6, A1, A2, Fz, Cz, Pz). The following electrodes were missing or displaced: none.   Background: The background consists of intermixed alpha and beta activities. There is a well defined posterior dominant rhythm of 8 Hz that attenuates with eye opening. There is an increase in slow activity with drowsiness and sleep is recorded with normal appearing structures.   Photic stimulation: Physiologic driving is not performed  EEG Abnormalities: none  Clinical Interpretation: This normal EEG is recorded in the waking and sleep state. There was no seizure or seizure predisposition recorded on this study. Please note that lack of epileptiform activity on EEG does not preclude the possibility of epilepsy.   Roland Rack, MD [facility] [phone]  If 7pm- 7am, please page neurology on call as listed in [place].

## 2022-06-05 NOTE — Progress Notes (Signed)
Eeg done 

## 2022-06-05 NOTE — Discharge Summary (Signed)
[facility] Discharge Summary   Patient: Courtney Chan ZOX:[id number]  PCP: Dion Body, MD  Date of admission: 06/04/2022   Date of discharge:  06/05/2022     Discharge Diagnoses:  Principal Problem:   Garbled speech Active Problems:   Persistent atrial fibrillation ([place])   Acquired hypothyroidism   Essential hypertension   History of TIA (transient ischemic attack)   Pure hypercholesterolemia   PAD (peripheral artery disease) ([place])   Hypothyroidism   Admitted From: Home Disposition:  Home with [facility] ([facility])  Recommendations for Outpatient Follow-up:  Follow-up with PCP in 1 week Follow-up with neurology in 1 to 2 weeks Follow up LABS/TEST:     Diet recommendation: Cardiac diet  Activity: The patient is advised to gradually reintroduce usual activities, as tolerated  Discharge Condition: stable  Code Status: Full code   History of present illness: As per the H and P dictated on admission Hospital Course:  [name] is an 89 year old female with history of moderate dementia, permanent atrial fibrillation, history of stroke, frequent UTIs, who presents emergency department for chief concerns of confusion and slurred speech.   Initial vitals in the emergency department showed temperature of 98.4, respiration rate of 16, heart rate of 77, blood pressure 141/70, SpO2 of 95% on room air.   Serum sodium is 140, potassium 4.4, chloride 103, bicarb 30, BUN of 18, serum creatinine of 0.97, EGFR 56, nonfasting blood glucose 105, WBC 8.2, hemoglobin 14.4, platelets of 327.   High sensitive troponin was ordered and initial value was 12.   CT of the head without contrast: Was read as new moderate size area of low-attenuation in the right frontal temporal region most compatible with a subacute right MCA territory infarct.  No evidence of hemorrhage.  Chronic microvascular ischemic change and cerebral volume loss.  Assessment and Plan:   Transient aphasia, patient had  multiple episodes of transient aphasia, currently back to her baseline.  Suspected TIA versus seizures.  Stroke was ruled out. MRI negative for any acute findings.  EEG negative for any seizure activity. Neurology was consulted, suspected TIA more than seizures as patient has history of A-fib and she is already on Eliquis which was continued. CTA H/N: 1. No large vessel occlusion. Likely acute occlusion of a prefrontal branch of the right MCA. No perfusion abnormality per CT brain perfusion. 2. Approximate 40% narrowing at the origin of the right ICA. 3. Compared to CT cervical spine 02/14/21, there is a new compression deformity at C7. LDL 93, slightly elevated, A1c 5.8, slightly elevated, TSH 0.96 within normal range Continue Eliquis 5 decrepit twice daily, Lipitor 40 mg p.o. daily.  TTE pending, can be done as an outpatient as per neurology.  Follow with cardiology for echocardiogram.  Patient was cleared by neurology to discharge home and follow-up as an outpatient in 1 month. # Hypothyroidism: Levothyroxine 88 mcg daily resumed # Pure hypercholesterolemia: Resumed Atorvastatin 40 mg daily home dose # Essential hypertension: resumed Toprol-XL 150 mg p.o. daily home dose # Persistent atrial fibrillation: Patient takes Eliquis 5 mg p.o. twice daily, this has been resumed Resumed home digoxin 62.5 mcg daily, metoprolol 100 mg daily were resumed  Body mass index is 23.92 kg/m.  Nutrition Interventions:     - Patient was instructed, not to drive, operate heavy machinery, perform activities at heights, swimming or participation in water activities or provide baby sitting services while on Pain, Sleep and Anxiety Medications; until her outpatient Physician has advised to do so again.  -  Also recommended to not to take more than prescribed Pain, Sleep and Anxiety Medications.  Patient was seen by physical therapy, who recommended Home health, which was arranged. On the day of the discharge the  patient's vitals were stable, and no other acute medical condition were reported by patient. the patient was felt safe to be discharge at Home with Home health.  Consultants: Neurology Procedures: None  Discharge Exam: General: Appear in no distress, no Rash; Oral Mucosa Clear, moist. Cardiovascular: S1 and S2 Present, no Murmur, Respiratory: normal respiratory effort, Bilateral Air entry present and no Crackles, no wheezes Abdomen: Bowel Sound present, Soft and no tenderness, no hernia Extremities: no Pedal edema, no calf tenderness Neurology: alert and oriented to time, place, and person affect appropriate.  Filed Weights   06/04/22 1138 06/04/22 2315  Weight: 63.6 kg 63.2 kg   Vitals:   06/05/22 0457 06/05/22 0748  BP: 123/60 127/60  Pulse: 69 63  Resp: 18 18  Temp: 97.7 F (36.5 C) 98.2 F (36.8 C)  SpO2: 94% 93%    DISCHARGE MEDICATION: Allergies as of 06/05/2022       Reactions   Metronidazole Diarrhea, Nausea And Vomiting   Oxycodone Other (See Comments)   Altered mental status        Medication List     STOP taking these medications    alendronate 70 MG tablet Commonly known as: FOSAMAX   clopidogrel 75 MG tablet Commonly known as: PLAVIX   losartan 25 MG tablet Commonly known as: COZAAR   omeprazole 20 MG capsule Commonly known as: PRILOSEC       TAKE these medications    atorvastatin 40 MG tablet Commonly known as: LIPITOR Take 1 tablet (40 mg total) by mouth daily at 6 PM. What changed:  medication strength how much to take   Eliquis 5 MG Tabs tablet Generic drug: apixaban Take 5 mg by mouth 2 (two) times daily.   Lanoxin 62.5 MCG Tabs Generic drug: Digoxin   levothyroxine 88 MCG tablet Commonly known as: SYNTHROID Take 1 tablet (88 mcg total) by mouth daily.   metoprolol succinate 50 MG 24 hr tablet Commonly known as: TOPROL-XL Take 3 tablets (150 mg total) by mouth daily. Take with or immediately following a meal. What  changed: how much to take   pantoprazole 40 MG tablet Commonly known as: Protonix Take 1 tablet (40 mg total) by mouth daily.       Allergies  Allergen Reactions   Metronidazole Diarrhea and Nausea And Vomiting   Oxycodone Other (See Comments)    Altered mental status   Discharge Instructions     Call MD for:   Complete by: As directed    Altered mental status, change in speech, difficulty swallowing.  any seizure activity   Call MD for:  difficulty breathing, headache or visual disturbances   Complete by: As directed    Call MD for:  extreme fatigue   Complete by: As directed    Call MD for:  persistant dizziness or light-headedness   Complete by: As directed    Call MD for:  persistant nausea and vomiting   Complete by: As directed    Call MD for:  severe uncontrolled pain   Complete by: As directed    Call MD for:  temperature >100.4   Complete by: As directed    Diet - low sodium heart healthy   Complete by: As directed    Discharge instructions   Complete by: As  directed    Follow-up with PCP in 1 week Follow-up with neurology in 1 to 2 weeks   Increase activity slowly   Complete by: As directed        The results of significant diagnostics from this hospitalization (including imaging, microbiology, ancillary and laboratory) are listed below for reference.    Significant Diagnostic Studies: EEG adult  Result Date: 2022-06-11 Rejeana Brock, MD     06/11/2022 12:22 PM History: 89 yo F with transient episodes of aphasia Sedation: None Technique: This EEG was acquired with electrodes placed according to the International 10-20 electrode system (including Fp1, Fp2, F3, F4, C3, C4, P3, P4, O1, O2, T3, T4, T5, T6, A1, A2, Fz, Cz, Pz). The following electrodes were missing or displaced: none. Background: The background consists of intermixed alpha and beta activities. There is a well defined posterior dominant rhythm of 8 Hz that attenuates with eye opening.  There is an increase in slow activity with drowsiness and sleep is recorded with normal appearing structures. Photic stimulation: Physiologic driving is not performed EEG Abnormalities: none Clinical Interpretation: This normal EEG is recorded in the waking and sleep state. There was no seizure or seizure predisposition recorded on this study. Please note that lack of epileptiform activity on EEG does not preclude the possibility of epilepsy. Ritta Slot, MD [facility] [phone] If 7pm- 7am, please page neurology on call as listed in [facility].   MR BRAIN WO CONTRAST  Result Date: 06/04/2022 CLINICAL DATA:  Stroke, follow up.  Slurred speech. EXAM: MRI HEAD WITHOUT CONTRAST TECHNIQUE: Multiplanar, multiecho pulse sequences of the brain and surrounding structures were obtained without intravenous contrast. COMPARISON:  Head CT and CTA 06/04/2022.  Head MRI 06/30/2019. FINDINGS: Brain: The moderate-sized right MCA infarct seen on CT involving the insula and frontal operculum is late subacute to chronic. A small chronic infarct at the posterior aspect of the right insula is unchanged from the prior MRI. No acute infarct, intracranial hemorrhage, mass, midline shift, or extra-axial fluid collection is identified. T2 hyperintensities elsewhere in the cerebral white matter bilaterally have slightly progressed from the prior MRI and are nonspecific but compatible with mild chronic small vessel ischemic disease. There is moderate cerebral atrophy. Vascular: Major intracranial vascular flow voids are preserved. Skull and upper cervical spine: Unremarkable bone marrow signal. Sinuses/Orbits: Bilateral cataract extraction. Paranasal sinuses and mastoid air cells are clear. Other: None. IMPRESSION: 1. No acute intracranial abnormality. 2. Late subacute to chronic right MCA infarct. 3. Mild chronic small vessel ischemic disease and moderate cerebral atrophy. Electronically Signed   By: Sebastian Ache M.D.    On: 06/04/2022 16:22   CT ANGIO HEAD NECK W WO CM W PERF (CODE STROKE)  Result Date: 06/04/2022 CLINICAL DATA:  Stroke suspected EXAM: CT ANGIOGRAPHY HEAD AND NECK CT PERFUSION BRAIN TECHNIQUE: Multidetector CT imaging of the head and neck was performed using the standard protocol during bolus administration of intravenous contrast. Multiplanar CT image reconstructions and MIPs were obtained to evaluate the vascular anatomy. Carotid stenosis measurements (when applicable) are obtained utilizing NASCET criteria, using the distal internal carotid diameter as the denominator. Multiphase CT imaging of the brain was performed following IV bolus contrast injection. Subsequent parametric perfusion maps were calculated using RAPID software. RADIATION DOSE REDUCTION: This exam was performed according to the departmental dose-optimization program which includes automated exposure control, adjustment of the mA and/or kV according to patient size and/or use of iterative reconstruction technique. CONTRAST:  OMNIPAQUE IOHEXOL 350 MG/ML SOLN  COMPARISON:  Same day CT Brain.  CT C Spine 02/14/21 FINDINGS: CT HEAD FINDINGS Please see same-day CT brain for intracranial findings. CTA NECK FINDINGS Aortic arch: 2 vessel aortic arch. Imaged portion shows no evidence of aneurysm or dissection. No significant stenosis of the major arch vessel origins. Right carotid system: Mild narrowing of the origin of the right ICA secondary to calcified atherosclerotic plaque. Left carotid system: No evidence of dissection, stenosis (50% or greater) or occlusion. Vertebral arteries: The distal V2 segments of bilateral vertebral arteries are incompletely assessed due to combination of motion and streak artifact. Within this limitation, codominant system without evidence of significant stenosis or occlusion. Skeleton: Compared to CT cervical spine 02/14/21, there is a new compression deformity at C7. Other neck: None Upper chest: Ground-glass  appearance of the bilateral upper lungs is favored to be secondary to expiratory phase of imaging. Review of the MIP images confirms the above findings CTA HEAD FINDINGS Anterior circulation: Assessment of the cavernous segments of bilateral ICAs is limited due to motion artifact. There is likely an acute occlusion of a prefrontal branch of the right MCA (series 13, image 24). Posterior circulation: No significant stenosis, proximal occlusion, aneurysm, or vascular malformation. Venous sinuses: As permitted by contrast timing, patent. Anatomic variants: None Review of the MIP images confirms the above findings CT Brain Perfusion Findings: ASPECTS: 5 according to same day CT. CBF (<30%) Volume: 57mL Perfusion (Tmax>6.0s) volume: 21mL Mismatch Volume: 97mL Infarction Location:Right frontal lobe IMPRESSION: 1. No large vessel occlusion. Likely acute occlusion of a prefrontal branch of the right MCA. No perfusion abnormality per CT brain perfusion. 2. Approximate 40% narrowing at the origin of the right ICA. 3. Compared to CT cervical spine 02/14/21, there is a new compression deformity at C7. Electronically Signed   By: Lorenza Cambridge M.D.   On: 06/04/2022 15:50   CT HEAD CODE STROKE WO CONTRAST  Result Date: 06/04/2022 CLINICAL DATA:  Code stroke.  Slurred speech EXAM: CT HEAD WITHOUT CONTRAST TECHNIQUE: Contiguous axial images were obtained from the base of the skull through the vertex without intravenous contrast. RADIATION DOSE REDUCTION: This exam was performed according to the departmental dose-optimization program which includes automated exposure control, adjustment of the mA and/or kV according to patient size and/or use of iterative reconstruction technique. COMPARISON:  06/04/2022 FINDINGS: Brain: Redemonstrated area of hypodensity in the right frontal and anterior temporal lobe, in the right anterior MCA territory. No new hypodensity to suggest additional acute infarct. No evidence of acute hemorrhage, mass,  mass effect, or midline shift. No hydrocephalus or extra-axial collection. Age-related cerebral volume loss. Vascular: No hyperdense vessel. Skull: Negative for fracture or focal lesion. Sinuses/Orbits: No acute finding. Status post bilateral lens replacements. Other: The mastoid air cells are well aerated. ASPECTS [facility] Stroke Program Early CT Score) - Ganglionic level infarction (caudate, lentiform nuclei, internal capsule, insula, M1-M3 cortex): 4 - Supraganglionic infarction (M4-M6 cortex): 1 Total score (0-10 with 10 being normal): 5 IMPRESSION: 1. Redemonstrated hypodensity in the right frontal and anterior temporal lobe, in the right anterior MCA territory, compatible with an acute to subacute infarct. No new hypodensity to suggest additional acute infarct. 2. ASPECTS is 5. Imaging results were communicated on 06/04/2022 at 3:14 pm to provider Dr. Amada Jupiter via secure text paging. Electronically Signed   By: Wiliam Ke M.D.   On: 06/04/2022 15:18   CT HEAD WO CONTRAST  Result Date: 06/04/2022 CLINICAL DATA:  Transient ischemic attack (TIA) EXAM: CT HEAD WITHOUT CONTRAST TECHNIQUE:  Contiguous axial images were obtained from the base of the skull through the vertex without intravenous contrast. RADIATION DOSE REDUCTION: This exam was performed according to the departmental dose-optimization program which includes automated exposure control, adjustment of the mA and/or kV according to patient size and/or use of iterative reconstruction technique. COMPARISON:  02/14/2021 FINDINGS: Brain: New moderate-sized area of decreased attenuation with loss of gray-white matter junction in the right frontotemporal region compatible with a subacute infarct. No evidence of hemorrhage. No hydrocephalus. No extra-axial collection. No mass lesion identified. Patchy low-density changes within the periventricular and subcortical white matter compatible with chronic microvascular ischemic change. Mild-moderate diffuse  cerebral volume loss. Vascular: Atherosclerotic calcifications involving the large vessels of the skull base. No unexpected hyperdense vessel. Skull: Normal. Negative for fracture or focal lesion. Sinuses/Orbits: No acute finding. Other: None. IMPRESSION: 1. New moderate-sized area of low attenuation in the right frontotemporal region most compatible with a subacute right MCA territory infarct. No evidence of hemorrhage. 2. Chronic microvascular ischemic change and cerebral volume loss. These results were called by telephone at the time of interpretation on 06/04/2022 at 12:26 pm to provider [facility] , who verbally acknowledged these results. Electronically Signed   By: Davina Poke D.O.   On: 06/04/2022 12:28    Microbiology: No results found for this or any previous visit (from the past 240 hour(s)).   Labs: CBC: Recent Labs  Lab 06/04/22 1149  WBC 8.2  NEUTROABS 5.2  HGB 14.4  HCT 46.0  MCV 96.2  PLT 440   Basic Metabolic Panel: Recent Labs  Lab 06/04/22 1149  NA 140  K 4.4  CL 103  CO2 30  GLUCOSE 105*  BUN 18  CREATININE 0.97  CALCIUM 9.9   Liver Function Tests: Recent Labs  Lab 06/04/22 1149  AST 26  ALT 25  ALKPHOS 112  BILITOT 0.8  PROT 7.6  ALBUMIN 3.8   No results for input(s): "LIPASE", "AMYLASE" in the last 168 hours. No results for input(s): "AMMONIA" in the last 168 hours. Cardiac Enzymes: No results for input(s): "CKTOTAL", "CKMB", "CKMBINDEX", "TROPONINI" in the last 168 hours. BNP (last 3 results) No results for input(s): "BNP" in the last 8760 hours. CBG: Recent Labs  Lab 06/04/22 1137 06/04/22 1446  GLUCAP 99 83    Time spent: 35 minutes  Signed:  Val Riles  [facility]  06/05/2022 2:11 PM

## 2022-06-05 NOTE — Progress Notes (Signed)
  Transition of Care [facility]) Screening Note   Patient Details  Name: CAILEIGH CANCHE Date of Birth: Apr 23, 1931   [facility]) CM/SW Contact:    Quin Hoop, LCSW Phone Number: 06/05/2022, 9:46 AM    Transition of Care Department [facility]) has reviewed patient and no TOC needs have been identified at this time. We will continue to monitor patient advancement through interdisciplinary progression rounds. If new patient transition needs arise, please place a TOC consult.

## 2022-06-05 NOTE — Progress Notes (Signed)
Subjective: No stroke on MRI  Exam: Vitals:   06/05/22 0457 06/05/22 0748  BP: 123/60 127/60  Pulse: 69 63  Resp: 18 18  Temp: 97.7 F (36.5 C) 98.2 F (36.8 C)  SpO2: 94% 93%   Gen: In bed, NAD Resp: non-labored breathing, no acute distress Abd: soft, nt  Neuro: MS: awake, alert, able to name simplae objects, speech is fluent.  CN:she occasionally has a left esophoria She has a very mild left sided weakness Pertinent Labs: LDL 93   Impression: 89 yo F with a history of afib who presented with two episodes of aphasia. I was able to examine her during one of the episodes, and it did appear to me to be a focal aphasia as opposed to delirium. With negative MRI and two stereotyped spells, seizure has to be considered though I think TIA is more likely with her history of afib. I would not change her from eliquis to another anticoagulant as I do not think we have compelling data that doing so provides any benefit in this situation. No significant stenosis contributing. I think echo is unlikely to change management  Recommendations: 1) Continue eliquis 5mg  BID 2) agree with increased dose of atorvastatin(was on 20mg  daily, now on 40mg  daily) 3) EEG(if negative, would not start AEDs) 4) f/u with outpatient neurology  Roland Rack, MD [facility] [phone]  If 7pm- 7am, please page neurology on call as listed in [place].

## 2022-06-05 NOTE — Progress Notes (Signed)
Physical Therapy Evaluation Patient Details Name: Courtney Chan MRN: [id number] DOB: Jan 09, 1931 Today's Date: 06/05/2022  History of Present Illness  Courtney Chan is a 89 y.o. female with a history of hypertension, hyperlipidemia, atrial fibrillation not on anticoagulation with subacute right frontal stroke now presenting with aphasia  Clinical Impression  Pt is a pleasant 89 year old female who was admitted for garbled speech and now +subacute R MCA CVA. Pt performs bed mobility with mod I, transfers with min assist, and ambulation with min assist and RW. Pt demonstrates deficits with cognition/balance/endurance. Would benefit from skilled PT to address above deficits and promote optimal return to PLOF. Recommend transition to [place] upon discharge from acute hospitalization.       Recommendations for follow up therapy are one component of a multi-disciplinary discharge planning process, led by the attending physician.  Recommendations may be updated based on patient status, additional functional criteria and insurance authorization.  Follow Up Recommendations Home health PT      Assistance Recommended at Discharge Frequent or constant Supervision/Assistance  Patient can return home with the following  A little help with walking and/or transfers;Help with stairs or ramp for entrance    Equipment Recommendations Rolling walker (2 wheels)  Recommendations for Other Services       Functional Status Assessment Patient has had a recent decline in their functional status and demonstrates the ability to make significant improvements in function in a reasonable and predictable amount of time.     Precautions / Restrictions Precautions Precautions: Fall Restrictions Weight Bearing Restrictions: No      Mobility  Bed Mobility Overal bed mobility: Modified Independent                  Transfers Overall transfer level: Needs assistance Equipment used: Rolling walker (2  wheels) Transfers: Sit to/from Stand Sit to Stand: Min assist           General transfer comment: tends to lean to left in sitting.    Ambulation/Gait Ambulation/Gait assistance: Min assist Gait Distance (Feet): 100 Feet Assistive device: Rolling walker (2 wheels) Gait Pattern/deviations: Step-through pattern       General Gait Details: ambulated in hallway with reciprocal gait pattern and RW. Tends to veer to L needing min assist for RW correction  Stairs            Wheelchair Mobility    Modified Rankin (Stroke Patients Only)       Balance Overall balance assessment: Needs assistance Sitting-balance support: Feet supported Sitting balance-Leahy Scale: Fair     Standing balance support: Bilateral upper extremity supported Standing balance-Leahy Scale: Poor                               Pertinent Vitals/Pain Pain Assessment Pain Assessment: No/denies pain    Home Living Family/patient expects to be discharged to:: Private residence Living Arrangements: Spouse/significant other Available Help at Discharge: Family;Available 24 hours/day Type of Home: House Home Access: Ramped entrance       Home Layout: One level Home Equipment: Rollator (4 wheels);Cane - single point;BSC/3in1;Wheelchair - manual      Prior Function Prior Level of Function : Needs assist             Mobility Comments: 4WW for mobility, walks daily along block ADLs Comments: assist for LBD     Hand Dominance   Dominant Hand: Right    Extremity/Trunk Assessment  Upper Extremity Assessment Upper Extremity Assessment: Overall WFL for tasks assessed    Lower Extremity Assessment Lower Extremity Assessment: Generalized weakness (B LE grossly 4/5)       Communication   Communication: HOH  Cognition Arousal/Alertness: Awake/alert Behavior During Therapy: WFL for tasks assessed/performed Overall Cognitive Status: History of cognitive impairments - at  baseline                                 General Comments: oriented to self and place, however confused to situation        General Comments      Exercises     Assessment/Plan    PT Assessment Patient needs continued PT services  PT Problem List Decreased strength;Decreased activity tolerance;Decreased balance;Decreased mobility       PT Treatment Interventions Gait training;DME instruction;Therapeutic exercise;Balance training    PT Goals (Current goals can be found in the Care Plan section)  Acute Rehab PT Goals Patient Stated Goal: to go home PT Goal Formulation: With patient Time For Goal Achievement: 06/19/22 Potential to Achieve Goals: Good    Frequency Min 2X/week     Co-evaluation               AM-PAC PT "6 Clicks" Mobility  Outcome Measure Help needed turning from your back to your side while in a flat bed without using bedrails?: A Little Help needed moving from lying on your back to sitting on the side of a flat bed without using bedrails?: A Little Help needed moving to and from a bed to a chair (including a wheelchair)?: A Little Help needed standing up from a chair using your arms (e.g., wheelchair or bedside chair)?: A Little Help needed to walk in hospital room?: A Little Help needed climbing 3-5 steps with a railing? : A Little 6 Click Score: 18    End of Session Equipment Utilized During Treatment: Gait belt Activity Tolerance: Patient tolerated treatment well Patient left: in bed;with bed alarm set Nurse Communication: Mobility status PT Visit Diagnosis: Unsteadiness on feet (R26.81);Muscle weakness (generalized) (M62.81);Difficulty in walking, not elsewhere classified (R26.2)    Time: 9449-6759 PT Time Calculation (min) (ACUTE ONLY): 14 min   Charges:   PT Evaluation $PT Eval Low Complexity: 1 Low          Greggory Stallion, PT, DPT, GCS [phone]   Gem Conkle 06/05/2022, 4:47 PM

## 2022-06-05 NOTE — TOC Transition Note (Signed)
Transition of Care [facility]) - CM/SW Discharge Note   Patient Details  Name: Courtney Chan MRN: [id number] Date of Birth: 1930/11/21  [facility] CM/SW Contact:  Quin Hoop, LCSW Phone Number: 06/05/2022, 2:56 PM   Clinical Narrative:     Pt discharging home with [facility]PT from [facility].  Pt already has RW at home per husband, Jenny Reichmann.  Pt being transported home by private vehicle.  Final next level of care: [place] Barriers to Discharge: No Barriers Identified   Patient Goals and CMS Choice      Discharge Placement                         Discharge Plan and Services Additional resources added to the After Visit Summary for                            [facility] Arranged: PT [facility] Agency: [facility] Date [place]: 06/05/22   Representative spoke with at [place]: Juanda Crumble  Social Determinants of Health ([place]) Interventions SDOH Screenings   Food Insecurity: No Food Insecurity (06/05/2022)  Housing: Low Risk  (06/05/2022)  Transportation Needs: No Transportation Needs (06/05/2022)  Utilities: Not At Risk (06/05/2022)  Depression (PHQ2-9): Low Risk  (08/27/2019)  Tobacco Use: Low Risk  (06/04/2022)     Readmission Risk Interventions     No data to display

## 2022-06-05 NOTE — Evaluation (Signed)
Occupational Therapy Evaluation Patient Details Name: Courtney Chan MRN: [id number] DOB: [birth year] Today's Date: 06/05/2022   History of Present Illness Courtney Chan is a 89 y.o. female with a history of hypertension, hyperlipidemia, atrial fibrillation not on anticoagulation with subacute right frontal stroke now presenting with aphasia   Clinical Impression   Courtney Chan was seen for OT evaluation this date. Prior to hospital admission, pt was MOD I for mobility using rollator. Pt lives with spouse in home c ramped entrance. Pt presents to acute OT demonstrating impaired ADL performance and functional mobility 2/2 decreased activity tolerance. Pt currently oriented to self only. MOD I exit bed. Requires MIN A + RW sit<>stand improves to SBA with rail use standing from commode. SUPERVISION toileting, don/doff socks, and hand hygiene standing sink side. Educated pt on importance of mobility for functional strengthening, no acute needs identified, will sign off. Upon hospital discharge, recommend no OT follow up.   Recommendations for follow up therapy are one component of a multi-disciplinary discharge planning process, led by the attending physician.  Recommendations may be updated based on patient status, additional functional criteria and insurance authorization.   Follow Up Recommendations  No OT follow up     Assistance Recommended at Discharge Intermittent Supervision/Assistance  Patient can return home with the following A little help with walking and/or transfers;A little help with bathing/dressing/bathroom;Help with stairs or ramp for entrance    Functional Status Assessment  Patient has had a recent decline in their functional status and demonstrates the ability to make significant improvements in function in a reasonable and predictable amount of time.  Equipment Recommendations  None recommended by OT    Recommendations for Other Services       Precautions /  Restrictions Precautions Precautions: Fall Restrictions Weight Bearing Restrictions: No      Mobility Bed Mobility Overal bed mobility: Modified Independent                  Transfers Overall transfer level: Needs assistance Equipment used: Rolling walker (2 wheels) Transfers: Sit to/from Stand Sit to Stand: Min assist           General transfer comment: MIN A to stabilize RW as pt uses rollator, improves to CGA with grab bar use      Balance Overall balance assessment: No apparent balance deficits (not formally assessed)                                         ADL either performed or assessed with clinical judgement   ADL Overall ADL's : Needs assistance/impaired                                       General ADL Comments: MIN A toilet t/f, improves to SBA with rail use. SUPERVISION toileting and hand hygiene standing sink side. SUPERVISION don/doff B socks in sitting      Pertinent Vitals/Pain Pain Assessment Pain Assessment: No/denies pain     Hand Dominance Right   Extremity/Trunk Assessment Upper Extremity Assessment Upper Extremity Assessment: Overall WFL for tasks assessed   Lower Extremity Assessment Lower Extremity Assessment: Generalized weakness       Communication Communication Communication: HOH   Cognition Arousal/Alertness: Awake/alert Behavior During Therapy: WFL for tasks assessed/performed Overall Cognitive Status: History of cognitive  impairments - at baseline                                 General Comments: oriented to self and situation only, states location as [facility] and date as May [birth year]      [place] expects to be discharged to:: Private residence Living Arrangements: Spouse/significant other Available Help at Discharge: Family;Available 24 hours/day Type of Home: House Home Access: Ramped entrance     Home Layout: One level                Home Equipment: Rollator (4 wheels);Cane - single point;BSC/3in1;Wheelchair - manual          Prior Functioning/Environment Prior Level of Function : Needs assist             Mobility Comments: 4WW for mobility, walks daily along block ADLs Comments: assist for LBD        OT Problem List: Decreased strength;Decreased activity tolerance         OT Goals(Current goals can be found in the care plan section) Acute Rehab OT Goals Patient Stated Goal: to go home OT Goal Formulation: With patient Time For Goal Achievement: 06/19/22 Potential to Achieve Goals: Good   AM-PAC OT "6 Clicks" Daily Activity     Outcome Measure Help from another person eating meals?: None Help from another person taking care of personal grooming?: A Little Help from another person toileting, which includes using toliet, bedpan, or urinal?: A Little Help from another person bathing (including washing, rinsing, drying)?: A Little Help from another person to put on and taking off regular upper body clothing?: None Help from another person to put on and taking off regular lower body clothing?: A Little 6 Click Score: 20   End of Session Equipment Utilized During Treatment: Rolling walker (2 wheels) Nurse Communication: Mobility status  Activity Tolerance: Patient tolerated treatment well Patient left: in chair;with call bell/phone within reach;with family/visitor present;with nursing/sitter in room  OT Visit Diagnosis: Other abnormalities of gait and mobility (R26.89);Muscle weakness (generalized) (M62.81)                Time: 4235-3614 OT Time Calculation (min): 21 min Charges:  OT General Charges $OT Visit: 1 Visit OT Evaluation $OT Eval Low Complexity: 1 Low OT Treatments $Self Care/Home Management : 8-22 mins  Dessie Coma, M.S. OTR/L  06/05/22, 10:24 AM  ascom [phone]

## 2022-06-05 NOTE — Evaluation (Addendum)
Clinical/Bedside Swallow Evaluation Patient Details  Name: Courtney Chan MRN: [id number] Date of Birth: 08-18-1930  Today's Date: 06/05/2022 Time: SLP Start Time (ACUTE ONLY): 0845 SLP Stop Time (ACUTE ONLY): 0945 SLP Time Calculation (min) (ACUTE ONLY): 60 min  Past Medical History:  Past Medical History:  Diagnosis Date   Atrial fibrillation ([place]) 2011   Hyperlipidemia    Hypertension    Stroke [facility])    Thyroid disease    Past Surgical History:  Past Surgical History:  Procedure Laterality Date   ABDOMINAL HYSTERECTOMY     BREAST BIOPSY Left 1980   Negative   CORONARY/GRAFT ACUTE MI REVASCULARIZATION N/A 06/28/2019   Procedure: Coronary/Graft Acute MI Revascularization;  Surgeon: Leonie Man, MD;  Location: [place] CV LAB;  Service: Cardiovascular;  Laterality: N/A;   LEFT HEART CATH AND CORONARY ANGIOGRAPHY N/A 06/28/2019   Procedure: LEFT HEART CATH AND CORONARY ANGIOGRAPHY;  Surgeon: Leonie Man, MD;  Location: [place] CV LAB;  Service: Cardiovascular;  Laterality: N/A;   HPI:  Per H&P, pt is an 89 year old female with history of moderate dementia, permanent atrial fibrillation, history of stroke, frequent UTIs, who presents emergency department for chief concerns of confusion and slurred speech. CT of the head without contrast: Was read as new moderate size area of low-attenuation in the right frontal temporal region most compatible with a subacute right MCA territory infarct.  No evidence of hemorrhage.  Chronic microvascular ischemic change and cerebral volume loss. At bedside, patient was able to tell me her full name.  She was not able to tell me her age, the current calendar year, or the current month.  She was able to tell me that she was in the hospital.  She was not able to identify her daughter correctly at bedside. She states that its her sister at bedside. Daughter reports that they were leaving the cardiologist regular clinic appointments when  patient was noted to have difficulties speaking and confusion. This lasted approximately 5 minutes. Daughter denies patient's difficulty ambulating. At bedside, patient denies chest pain, shortness of breath, abdominal pain, dysuria, hematuria.     Post admission, patient was noted to have increased confusion expressive aphasia.    Assessment / Plan / Recommendation  Clinical Impression  Pt seen today for BSE. Pt appeared alert, cooperative, and mildly confused throughout eval. Pt's husband present. Pt's daughter arrived during eval. Pt left in bed with OT present, bed alarm set, and call button within reach. Of note, pt has a baseline of moderate Dementia per daughter.  Pt on room air; afebrile, WBC WFL. Informal oral mech exam given and WFL.   Pt did not appear to present with oropharyngeal dysphagia - oropharyngeal phase swallowing appeared functional for pt. Administered trials of ice chips (x 6), thin liquids (~3-4oz), puree (~6oz apple sauce and sherbet), and solids (1-2 graham cracker halves). Pt did not exhibit overt s/s of oropharyngeal dysphagia. During oral phase, pt  demonstrated appropriate bolus cohesion/control, functional mastication, timely bolus A/P transit, and clear oral cavity post swallow. Noted mildly prolonged mastication w/ graham cracker which may be d/t cognitive status and dry texture. Pt was monitored for talking with food in mouth. During pharyngeal phase, pt exhibited seemingly timely pharyngeal swallow and seemingly adequate hyolaryngeal elevation. Noted clear vocal quality post swallow. Noted congested throat clearing (x2) during administration of ice chips, but this appeared consistent with the congested cough PRIOR TO po's/before initiating eval. Throat clearing did not persist for remainder of ice chips  or other trials. SLP provided minimal verbal/tactile cues to eat/drink slowly and take breaks between sips/bites. Education of aspiration precautions and swallowing  strategies provided to pt and family/daughter.  Recommend regular diet w/ thin liquids via cup and medications given whole w/ puree. Recommend assistance with setup, intermittent supervision to cue for safe swallow strategies (small bites/sips, eat/drink slowly, breaks between sips/bites), reduced distractions during meals, and sitting upright during meals. No SLP f/u recommended at this time. MD to reconsult if new needs arise during admit. SLP Visit Diagnosis: Dysphagia, oral phase (R13.11)    Aspiration Risk  Mild aspiration risk (d/t dementia diagnosis)/Reduced when following general aspiration precautions.   Diet Recommendation  Recommend regular diet w/ thin liquids via cup and medications given whole w/ puree. Recommend assistance with setup, intermittent supervision to cue for safe swallow strategies (small bites/sips, eat/drink slowly, breaks between sips/bites), reduced distractions during meals, and sitting upright during meals.   Medication Administration: Whole meds with puree    Other  Recommendations Recommended Consults:  (N/A) Oral Care Recommendations: Oral care BID;Oral care before and after PO;Staff/trained caregiver to provide oral care    Recommendations for follow up therapy are one component of a multi-disciplinary discharge planning process, led by the attending physician.  Recommendations may be updated based on patient status, additional functional criteria and insurance authorization.  Follow up Recommendations No SLP follow up      Assistance Recommended at Discharge  Intermittent/Full  Functional Status Assessment Patient has had a recent decline in their functional status and/or demonstrates limited ability to make significant improvements in function in a reasonable and predictable amount of time  Frequency and Duration     N/A       Prognosis Prognosis for Safe Diet Advancement: Fair Barriers to Reach Goals: Cognitive deficits;Severity of deficits       Swallow Study   General Date of Onset: 06/04/22 HPI: Per H&P, pt is an 89 year old female with history of moderate dementia, permanent atrial fibrillation, history of stroke, frequent UTIs, who presents emergency department for chief concerns of confusion and slurred speech. CT of the head without contrast: Was read as new moderate size area of low-attenuation in the right frontal temporal region most compatible with a subacute right MCA territory infarct.  No evidence of hemorrhage.  Chronic microvascular ischemic change and cerebral volume loss. At bedside, patient was able to tell me her full name.  She was not able to tell me her age, the current calendar year, or the current month.  She was able to tell me that she was in the hospital.  She was not able to identify her daughter correctly at bedside. She states that its her sister at bedside. Daughter reports that they were leaving the cardiologist regular clinic appointments when patient was noted to have difficulties speaking and confusion. This lasted approximately 5 minutes. Daughter denies patient's difficulty ambulating. At bedside, patient denies chest pain, shortness of breath, abdominal pain, dysuria, hematuria.     Post admission, patient was noted to have increased confusion expressive aphasia. Type of Study: Bedside Swallow Evaluation Previous Swallow Assessment: 2/21 (No overt s/s of oropharyngeal dysphagia. Pt put on regular diet w/ thin liquids.) Diet Prior to this Study: NPO Temperature Spikes Noted: No (WBC 8.2) Respiratory Status: Room air History of Recent Intubation: No Behavior/Cognition: Alert;Cooperative;Pleasant mood;Confused;Requires cueing (Min. verbal and tactile cues) Oral Cavity Assessment: Dry;Within Functional Limits Oral Care Completed by SLP: No Oral Cavity - Dentition: Adequate natural dentition Vision: Functional for  self-feeding Self-Feeding Abilities: Able to feed self;Needs set up Patient Positioning:  Upright in bed Baseline Vocal Quality: Normal Volitional Swallow: Able to elicit    Oral/Motor/Sensory Function Overall Oral Motor/Sensory Function: Within functional limits   Ice Chips Ice chips: Within functional limits Presentation: Spoon (x 6)   Thin Liquid Thin Liquid: Within functional limits (x ~3-4oz) Presentation: Cup;Self Fed    Nectar Thick Nectar Thick Liquid: Not tested   Honey Thick Honey Thick Liquid: Not tested   Puree Puree: Within functional limits Presentation: Self Fed;Spoon (x ~ 6oz)   Solid     Solid: Within functional limits Presentation: Self Fed (x 1-2 graham cracker halves)      Dennie Fetters Graduate Clinician [facility], Speech Pathology   Dennie Fetters 06/05/2022,10:37 AM   ADDENDUM: The information in this patient note, response to treatment, and overall treatment plan developed has been reviewed and agreed upon after reviewing documentation. This session was performed under the supervision of this licensed clinician.    Jerilynn Som, MS, CCC-SLP Speech Language Pathologist Rehab Services; [facility] [phone] (ascom) 06/05/2022, 1652PM

## 2022-06-05 NOTE — Plan of Care (Signed)
  Problem: Education: Goal: Knowledge of disease or condition will improve Outcome: Progressing   Problem: Education: Goal: Knowledge of patient specific risk factors will improve Courtney Chan N/A or DELETE if not current risk factor) Outcome: Progressing   Problem: Education: Goal: Knowledge of secondary prevention will improve (MUST DOCUMENT ALL) Outcome: Progressing   Problem: Coping: Goal: Will identify appropriate support needs Outcome: Progressing   Problem: Health Behavior/Discharge Planning: Goal: Goals will be collaboratively established with patient/family Outcome: Progressing   Problem: Self-Care: Goal: Ability to communicate needs accurately will improve Outcome: Progressing   Problem: Nutrition: Goal: Risk of aspiration will decrease Outcome: Progressing   Problem: Education: Goal: Knowledge of General Education information will improve Description: Including pain rating scale, medication(s)/side effects and non-pharmacologic comfort measures Outcome: Progressing   Problem: Health Behavior/Discharge Planning: Goal: Ability to manage health-related needs will improve Outcome: Progressing   Problem: Coping: Goal: Level of anxiety will decrease Outcome: Progressing   Problem: Safety: Goal: Ability to remain free from injury will improve Outcome: Progressing   Problem: Skin Integrity: Goal: Risk for impaired skin integrity will decrease Outcome: Progressing

## 2022-07-12 ENCOUNTER — Other Ambulatory Visit: Payer: Self-pay

## 2022-07-12 ENCOUNTER — Encounter: Payer: Self-pay | Admitting: Internal Medicine

## 2022-07-12 ENCOUNTER — Emergency Department: Payer: Medicare PPO

## 2022-07-12 ENCOUNTER — Observation Stay
Admission: EM | Admit: 2022-07-12 | Discharge: 2022-07-13 | Disposition: A | Discharge status: Home / Self Care | Payer: Medicare PPO | Attending: Obstetrics and Gynecology | Admitting: Obstetrics and Gynecology

## 2022-07-12 DIAGNOSIS — Z7901 Long term (current) use of anticoagulants: Secondary | ICD-10-CM | POA: Insufficient documentation

## 2022-07-12 DIAGNOSIS — I509 Heart failure, unspecified: Secondary | ICD-10-CM

## 2022-07-12 DIAGNOSIS — M6281 Muscle weakness (generalized): Secondary | ICD-10-CM | POA: Diagnosis not present

## 2022-07-12 DIAGNOSIS — Z9981 Dependence on supplemental oxygen: Secondary | ICD-10-CM

## 2022-07-12 DIAGNOSIS — I10 Essential (primary) hypertension: Secondary | ICD-10-CM | POA: Diagnosis present

## 2022-07-12 DIAGNOSIS — I739 Peripheral vascular disease, unspecified: Secondary | ICD-10-CM | POA: Diagnosis present

## 2022-07-12 DIAGNOSIS — G459 Transient cerebral ischemic attack, unspecified: Secondary | ICD-10-CM | POA: Diagnosis present

## 2022-07-12 DIAGNOSIS — I4891 Unspecified atrial fibrillation: Secondary | ICD-10-CM | POA: Diagnosis not present

## 2022-07-12 DIAGNOSIS — E039 Hypothyroidism, unspecified: Secondary | ICD-10-CM | POA: Diagnosis not present

## 2022-07-12 DIAGNOSIS — F0390 Unspecified dementia without behavioral disturbance: Secondary | ICD-10-CM | POA: Diagnosis not present

## 2022-07-12 DIAGNOSIS — N183 Chronic kidney disease, stage 3 unspecified: Secondary | ICD-10-CM | POA: Insufficient documentation

## 2022-07-12 DIAGNOSIS — Z79899 Other long term (current) drug therapy: Secondary | ICD-10-CM | POA: Diagnosis not present

## 2022-07-12 DIAGNOSIS — N1831 Chronic kidney disease, stage 3a: Secondary | ICD-10-CM | POA: Diagnosis present

## 2022-07-12 DIAGNOSIS — I130 Hypertensive heart and chronic kidney disease with heart failure and stage 1 through stage 4 chronic kidney disease, or unspecified chronic kidney disease: Secondary | ICD-10-CM | POA: Insufficient documentation

## 2022-07-12 DIAGNOSIS — R569 Unspecified convulsions: Secondary | ICD-10-CM | POA: Diagnosis not present

## 2022-07-12 DIAGNOSIS — R2689 Other abnormalities of gait and mobility: Secondary | ICD-10-CM | POA: Insufficient documentation

## 2022-07-12 DIAGNOSIS — R2990 Unspecified symptoms and signs involving the nervous system: Secondary | ICD-10-CM

## 2022-07-12 DIAGNOSIS — Z8673 Personal history of transient ischemic attack (TIA), and cerebral infarction without residual deficits: Secondary | ICD-10-CM

## 2022-07-12 LAB — CBC WITH DIFFERENTIAL/PLATELET
Abs Immature Granulocytes: 0.05 10*3/uL (ref 0.00–0.07)
Basophils Absolute: 0 10*3/uL (ref 0.0–0.1)
Basophils Relative: 0 %
Eosinophils Absolute: 0.2 10*3/uL (ref 0.0–0.5)
Eosinophils Relative: 3 %
HCT: 45.2 % (ref 36.0–46.0)
Hemoglobin: 14.1 g/dL (ref 12.0–15.0)
Immature Granulocytes: 1 %
Lymphocytes Relative: 25 %
Lymphs Abs: 2.2 10*3/uL (ref 0.7–4.0)
MCH: 30.5 pg (ref 26.0–34.0)
MCHC: 31.2 g/dL (ref 30.0–36.0)
MCV: 97.6 fL (ref 80.0–100.0)
Monocytes Absolute: 0.8 10*3/uL (ref 0.1–1.0)
Monocytes Relative: 9 %
Neutro Abs: 5.6 10*3/uL (ref 1.7–7.7)
Neutrophils Relative %: 62 %
Platelets: 301 10*3/uL (ref 150–400)
RBC: 4.63 MIL/uL (ref 3.87–5.11)
RDW: 13.6 % (ref 11.5–15.5)
WBC: 8.9 10*3/uL (ref 4.0–10.5)
nRBC: 0 % (ref 0.0–0.2)

## 2022-07-12 LAB — URINALYSIS, ROUTINE W REFLEX MICROSCOPIC
Bilirubin Urine: NEGATIVE
Glucose, UA: NEGATIVE mg/dL
Hgb urine dipstick: NEGATIVE
Ketones, ur: NEGATIVE mg/dL
Leukocytes,Ua: NEGATIVE
Nitrite: NEGATIVE
Protein, ur: NEGATIVE mg/dL
Specific Gravity, Urine: 1.013 (ref 1.005–1.030)
pH: 5 (ref 5.0–8.0)

## 2022-07-12 LAB — COMPREHENSIVE METABOLIC PANEL
ALT: 27 U/L (ref 0–44)
AST: 26 U/L (ref 15–41)
Albumin: 3.8 g/dL (ref 3.5–5.0)
Alkaline Phosphatase: 106 U/L (ref 38–126)
Anion gap: 9 (ref 5–15)
BUN: 17 mg/dL (ref 8–23)
CO2: 27 mmol/L (ref 22–32)
Calcium: 9.8 mg/dL (ref 8.9–10.3)
Chloride: 104 mmol/L (ref 98–111)
Creatinine, Ser: 0.92 mg/dL (ref 0.44–1.00)
GFR, Estimated: 60 mL/min — ABNORMAL LOW (ref >60)
Glucose, Bld: 103 mg/dL — ABNORMAL HIGH (ref 70–99)
Potassium: 4.2 mmol/L (ref 3.5–5.1)
Sodium: 140 mmol/L (ref 135–145)
Total Bilirubin: 0.7 mg/dL (ref 0.3–1.2)
Total Protein: 7.2 g/dL (ref 6.5–8.1)

## 2022-07-12 LAB — CBC
HCT: 44.5 % (ref 36.0–46.0)
Hemoglobin: 14 g/dL (ref 12.0–15.0)
MCH: 30.2 pg (ref 26.0–34.0)
MCHC: 31.5 g/dL (ref 30.0–36.0)
MCV: 96.1 fL (ref 80.0–100.0)
Platelets: 290 10*3/uL (ref 150–400)
RBC: 4.63 MIL/uL (ref 3.87–5.11)
RDW: 13.4 % (ref 11.5–15.5)
WBC: 8.5 10*3/uL (ref 4.0–10.5)
nRBC: 0 % (ref 0.0–0.2)

## 2022-07-12 LAB — PROTIME-INR
INR: 1.4 — ABNORMAL HIGH (ref 0.8–1.2)
Prothrombin Time: 17.2 seconds — ABNORMAL HIGH (ref 11.4–15.2)

## 2022-07-12 LAB — ETHANOL: Alcohol, Ethyl (B): <10 mg/dL (ref <10)

## 2022-07-12 LAB — APTT: aPTT: 33 seconds (ref 24–36)

## 2022-07-12 LAB — DIGOXIN LEVEL: Digoxin Level: 0.7 ng/mL — ABNORMAL LOW (ref 0.8–2.0)

## 2022-07-12 MED ORDER — LEVOTHYROXINE SODIUM 88 MCG PO TABS
88.0000 ug | ORAL_TABLET | Freq: Every day | ORAL | Status: DC
  Administered 2022-07-13: 88 ug via ORAL
  Filled 2022-07-12: qty 1

## 2022-07-12 MED ORDER — ATORVASTATIN CALCIUM 20 MG PO TABS
40.0000 mg | ORAL_TABLET | Freq: Every day | ORAL | Status: DC
  Administered 2022-07-13: 40 mg via ORAL
  Filled 2022-07-12: qty 2

## 2022-07-12 MED ORDER — DIGOXIN 125 MCG PO TABS
0.1250 mg | ORAL_TABLET | Freq: Every day | ORAL | Status: DC
  Administered 2022-07-13: 0.125 mg via ORAL
  Filled 2022-07-12: qty 1

## 2022-07-12 MED ORDER — STROKE: EARLY STAGES OF RECOVERY BOOK: Freq: Once | Status: AC

## 2022-07-12 MED ORDER — ACETAMINOPHEN 325 MG PO TABS: 650.0000 mg | ORAL_TABLET | ORAL | Status: DC | PRN

## 2022-07-12 MED ORDER — ACETAMINOPHEN 160 MG/5ML PO SOLN: 650.0000 mg | ORAL | Status: DC | PRN

## 2022-07-12 MED ORDER — APIXABAN 5 MG PO TABS
5.0000 mg | ORAL_TABLET | Freq: Two times a day (BID) | ORAL | Status: DC
  Administered 2022-07-12 – 2022-07-13 (×2): 5 mg via ORAL
  Filled 2022-07-12 (×2): qty 1

## 2022-07-12 MED ORDER — METOPROLOL SUCCINATE ER 50 MG PO TB24
100.0000 mg | ORAL_TABLET | Freq: Every day | ORAL | Status: DC
  Filled 2022-07-12: qty 2

## 2022-07-12 MED ORDER — SENNOSIDES-DOCUSATE SODIUM 8.6-50 MG PO TABS: 1.0000 | ORAL_TABLET | Freq: Every evening | ORAL | Status: DC | PRN

## 2022-07-12 MED ORDER — ACETAMINOPHEN 650 MG RE SUPP: 650.0000 mg | RECTAL | Status: DC | PRN

## 2022-07-12 NOTE — Hospital Course (Signed)
89 year old female with history of moderate dementia, permanent atrial fibrillation with history of stroke, frequent UTI, hypothyroidism, hyperlipidemia, hypertension presented with slurred speech.  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  In the ED vitals:BP 162/75, not hypoxic, heart rate stable CT head redemonstration of large right MCA territory stroke, EKG with A-fib rate controlled.  Labs with fairly stable CBC CMP LDL 9, INR 1.4 UA unremarkable 3

## 2022-07-12 NOTE — ED Provider Notes (Signed)
[facility] Provider Note    Event Date/Time   First MD Initiated Contact with Patient 07/12/22 1504     (approximate)   History   Transient Ischemic Attack   HPI  Courtney Chan is a 89 y.o. female  here with slurred speech. Pt had approx 15-20 minutes of aphasic speech earlier today, around 12:30-1 oclock. Sx then resolved. Pt reports she does not remember this but husband states pt was trying to speak but was not making any sense with "jibberish" coming out. She did seem to understand him and commands however. This then resolved and she has been back to her baseline. Denies any focal numbness, weaknses, or other sx with this. No headaches.  No weakness, numbness, vision changes. She had a similar episode several months ago that recurred several times then resolved.        Physical Exam   Triage Vital Signs: ED Triage Vitals  Enc Vitals Group     BP 07/12/22 1247 (!) 162/75     Pulse Rate 07/12/22 1247 72     Resp 07/12/22 1247 17     Temp 07/12/22 1247 98.6 F (37 C)     Temp Source 07/12/22 1247 Oral     SpO2 07/12/22 1247 95 %     Weight 07/12/22 1248 135 lb (61.2 kg)     Height --      Head Circumference --      Peak Flow --      Pain Score 07/12/22 1248 0     Pain Loc --      Pain Edu? --      Excl. in [place]? --     Most recent vital signs: Vitals:   07/12/22 1500 07/12/22 1600  BP: (!) 152/117 139/76  Pulse: (!) 52 66  Resp: 19 20  Temp:    SpO2: 99% 97%     General: Awake, no distress.  CV:  Good peripheral perfusion. RRR. No murmurs. Resp:  Normal work of breathing. Lungs clear. Abd:  No distention. No tenderness. Other:  Face is symmetric. Sensation to light touch intact bl throughout face. Speech normal, no aphasia or slurred speech. Tongue protrusion midline. Strength 5/5 bl ue and le. Normal sensation to light touch. Normal FTN bilaterally.   ED Results / Procedures / Treatments   Labs (all labs ordered are  listed, but only abnormal results are displayed) Labs Reviewed  COMPREHENSIVE METABOLIC PANEL - Abnormal; Notable for the following components:      Result Value   Glucose, Bld 103 (*)    GFR, Estimated 60 (*)    All other components within normal limits  PROTIME-INR - Abnormal; Notable for the following components:   Prothrombin Time 17.2 (*)    INR 1.4 (*)    All other components within normal limits  URINALYSIS, ROUTINE W REFLEX MICROSCOPIC - Abnormal; Notable for the following components:   Color, Urine YELLOW (*)    APPearance CLEAR (*)    All other components within normal limits  CBC  APTT  ETHANOL  DIFFERENTIAL  DIGOXIN LEVEL     EKG AFib, VR 61. QRS 98, QTC 397. No acute st elevations or depressions. No ischemia or infarct.   RADIOLOGY CT Head: Redemonstrated large R MCA territory stroke   I also independently reviewed and agree with radiologist interpretations.   PROCEDURES:  Critical Care performed: No  Procedures    MEDICATIONS ORDERED IN ED: Medications   stroke: early stages  of recovery book (has no administration in time range)  acetaminophen (TYLENOL) tablet 650 mg (has no administration in time range)    Or  acetaminophen (TYLENOL) 160 MG/5ML solution 650 mg (has no administration in time range)    Or  acetaminophen (TYLENOL) suppository 650 mg (has no administration in time range)  senna-docusate (Senokot-S) tablet 1 tablet (has no administration in time range)  atorvastatin (LIPITOR) tablet 40 mg (has no administration in time range)  apixaban (ELIQUIS) tablet 5 mg (has no administration in time range)  digoxin (LANOXIN) tablet 0.125 mg (has no administration in time range)  levothyroxine (SYNTHROID) tablet 88 mcg (has no administration in time range)  metoprolol succinate (TOPROL-XL) 24 hr tablet 100 mg (has no administration in time range)     IMPRESSION / MDM / ASSESSMENT AND PLAN / ED COURSE  I reviewed the triage vital signs and the  nursing notes.                              Differential diagnosis includes, but is not limited to, TIA, CVA, intracranial mass/lesion, ICH, general medical condition bringing out underlying stroke sx  Patient's presentation is most consistent with acute presentation with potential threat to life or bodily function.  The patient is on the cardiac monitor to evaluate for evidence of arrhythmia and/or significant heart rate changes  89 yo F here with recurrent expressive aphasia, now resolved. Interestingly pt has had recurrent similar episodes raising question of partial seizures with h/o CVA, though TIA also a consideration. Sx now resolved. No ongoing focal deficits. CT head is negative. Screening labs are unremarkable and reassuring. Discussed with Hospitalist and will admit. Discussed with Dr. Leonel Ramsay per their request - recommends EEG and will see pt as inpatient.      FINAL CLINICAL IMPRESSION(S) / ED DIAGNOSES   Final diagnoses:  Stroke-like symptom     Rx / DC Orders   ED Discharge Orders     None        Note:  This document was prepared using Dragon voice recognition software and may include unintentional dictation errors.   Duffy Bruce, MD 07/12/22 1750

## 2022-07-12 NOTE — ED Triage Notes (Signed)
Pt family called 52 as pt was noticing slurred speech this AM. EMS arrived on scene and pt was sitting in a chair. Per EMS they did not see any stroke like s/s and pt was at her baseline. Pt is currently resting in triage at her baseline.

## 2022-07-12 NOTE — ED Notes (Signed)
Informed RN bed assigned 

## 2022-07-12 NOTE — H&P (Signed)
History and Physical    Courtney Chan [id number] DOB: July 07, 1930 DOA: 07/12/2022  PCP: Dion Body, MD   Patient coming from: home  Chief Complaint  Patient presents with   Transient Ischemic Attack     HPI: Courtney Chan 89 year old female with history of moderate dementia, permanent atrial fibrillation with history of stroke, frequent UTI, hypothyroidism, hyperlipidemia, hypertension presented with slurred speech.  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  In the ED vitals:BP 162/75, not hypoxic, heart rate stable CT head redemonstration of large right MCA territory stroke, EKG with A-fib rate controlled.  Labs with fairly stable CBC CMP LDL 9, INR 1.4 UA unremarkable 3  Currently she is resting well no weakness no speech issues. Her hsuband at the bedside  Assessment/Plan Principal Problem:   TIA (transient ischemic attack)  TIA w/ transient aphasia Previous history of stroke/TIA: CT head- similar large rt mca stroke- no new stroke. Admit to tele BP goal : Permissive HTN upto 220/110 mmHg  CT head reviewed, currently symptoms resolved. She just had MRI brain, CT angio head/neck, CT head on 1/22> await for neurology recommendation for further imaging studies.  Last echo from 2/15 2021.  Will order echo, LDL not at goal 93, goal less than 70 recent HbA1c normal, continue PT OT speech consult, telemetry monitoring, Frequent neuro checks, Stroke swallow screen. Dr Leonel Ramsay consulted I nED- advises EEG.   Dementia: Continue supportive care, oriented to  self place month- not to day- baseline  Chronic A-fib: Rate controlled on Eliquis, digoxin and metoprolol- check dig level and resume in am.  Essential hypertension: BP stable allow permissive hypertension  Hypothyroidism: Resume home Synthroid  Hyperlipidemia: LDL not at goal on Lipitor 40 - ?switch to Crestor 20  GOC: DNR, confirmed with patient and husband  Body mass index is 23.17 kg/m.   Severity of Illness: The appropriate patient status for this patient is OBSERVATION. Observation status is judged to be reasonable and necessary in order to provide the required intensity of service to ensure the patient's safety. The patient's presenting symptoms, physical exam findings, and initial radiographic and laboratory data in the context of their medical condition is felt to place them at decreased risk for further clinical deterioration. Furthermore, it is anticipated that the patient will be medically stable for discharge from the hospital within 2 midnights of admission.    DVT prophylaxis: apixaban (ELIQUIS) tablet 5 mg   Code Status:   Code Status: DNR  Family Communication: Admission, patients condition and plan of care including tests being ordered have been discussed with the patient and her husband who indicate understanding and agree with the plan and Code Status.  Consults called:  Neurology  Review of Systems: All systems were reviewed and were negative except as mentioned in HPI above. Negative for fever Negative for chest pain Negative for shortness of breath  Past Medical History:  Diagnosis Date   Atrial fibrillation ([place]) 2011   Hyperlipidemia    Hypertension    Stroke [facility])    Thyroid disease     Past Surgical History:  Procedure  Laterality Date   ABDOMINAL HYSTERECTOMY     BREAST BIOPSY Left 1980   Negative   CORONARY/GRAFT ACUTE MI REVASCULARIZATION N/A 06/28/2019   Procedure: Coronary/Graft Acute MI Revascularization;  Surgeon: Leonie Man, MD;   Location: [facility];  Service: Cardiovascular;  Laterality: N/A;   LEFT HEART CATH AND CORONARY ANGIOGRAPHY N/A 06/28/2019   Procedure: LEFT HEART CATH AND CORONARY ANGIOGRAPHY;  Surgeon: Leonie Man, MD;  Location: [facility];  Service: Cardiovascular;  Laterality: N/A;     reports that she has never smoked. She has never used smokeless tobacco. She reports that she does not drink alcohol and does not use drugs.  Allergies  Allergen Reactions   Metronidazole Diarrhea and Nausea And Vomiting   Oxycodone Other (See Comments)    Altered mental status    Family History  Problem Relation Age of Onset   Breast cancer Maternal Grandmother        60's     Prior to Admission medications   Medication Sig Start Date End Date Taking? Authorizing Provider  atorvastatin (LIPITOR) 40 MG tablet Take 1 tablet (40 mg total) by mouth daily at 6 PM. 06/05/22 09/03/22  Val Riles, MD  ELIQUIS 5 MG TABS tablet Take 5 mg by mouth 2 (two) times daily.    [provider]  LANOXIN 62.5 MCG TABS  09/14/19   [provider]  levothyroxine (SYNTHROID) 88 MCG tablet Take 1 tablet (88 mcg total) by mouth daily. 07/10/19   Angiulli, Lavon Paganini, PA-C  metoprolol succinate (TOPROL-XL) 50 MG 24 hr tablet Take 3 tablets (150 mg total) by mouth daily. Take with or immediately following a meal. Patient taking differently: Take 100 mg by mouth daily. Take with or immediately following a meal. 07/11/19   Angiulli, Lavon Paganini, PA-C  pantoprazole (PROTONIX) 40 MG tablet Take 1 tablet (40 mg total) by mouth daily. 04/04/20 04/04/21  Nena Polio, MD    Physical Exam: Vitals:   07/12/22 1247 07/12/22 1248 07/12/22 1500 07/12/22 1600  BP: (!) 162/75  (!) 152/117 139/76  Pulse: 72  (!) 52 66  Resp: '17  19 20  '$ Temp: 98.6 F (37 C)     TempSrc: Oral     SpO2: 95%  99% 97%  Weight:  61.2 kg      General exam: AAOx2,NAD, weak appearing. HEENT:Oral mucosa moist, Ear/Nose WNL  grossly, dentition normal. Respiratory system: bilaterally clear,no wheezing or crackles,no use of accessory muscle Cardiovascular system: S1 & S2 +, No JVD,. Gastrointestinal system: Abdomen soft, NT,ND, BS+ Nervous System:Alert, awake, moving all 4 extremities and grossly nonfocal Extremities: No edema, distal peripheral pulses palpable.  Skin: No rashes,no icterus. MSK: Normal muscle bulk,tone, power   Labs on Admission: I have personally reviewed following labs and imaging studies  CBC: Recent Labs  Lab 07/12/22 1250  WBC 8.5  HGB 14.0  HCT 44.5  MCV 96.1  PLT [id number]   Basic Metabolic Panel: Recent Labs  Lab 07/12/22 1250  NA 140  K 4.2  CL 104  CO2 27  GLUCOSE 103*  BUN 17  CREATININE 0.92  CALCIUM 9.8   GFR: Estimated Creatinine Clearance: 35.8 mL/min (by C-G formula based on SCr of 0.92 mg/dL). Liver Function Tests: Recent Labs  Lab 07/12/22 1250  AST 26  ALT 27  ALKPHOS 106  BILITOT 0.7  PROT 7.2  ALBUMIN 3.8   Recent Labs  Lab 07/12/22 1550  INR 1.4*  No results for  input(s): "VITAMINB12", "FOLATE", "FERRITIN", "TIBC", "IRON", "RETICCTPCT" in the last 72 hours. Urine analysis:    Component Value Date/Time   COLORURINE YELLOW (A) 07/12/2022 1527   APPEARANCEUR CLEAR (A) 07/12/2022 1527   APPEARANCEUR Clear 01/21/2019 0833   LABSPEC 1.013 07/12/2022 1527   LABSPEC 1.005 08/05/2011 1910   PHURINE 5.0 07/12/2022 1527   GLUCOSEU NEGATIVE 07/12/2022 1527   GLUCOSEU Negative 08/05/2011 1910   HGBUR NEGATIVE 07/12/2022 1527   BILIRUBINUR NEGATIVE 07/12/2022 1527   BILIRUBINUR Negative 01/21/2019 0833   BILIRUBINUR Negative 08/05/2011 1910   KETONESUR NEGATIVE 07/12/2022 1527   PROTEINUR NEGATIVE 07/12/2022 1527   NITRITE NEGATIVE 07/12/2022 1527   LEUKOCYTESUR NEGATIVE 07/12/2022 1527   LEUKOCYTESUR Negative 08/05/2011 1910    Radiological Exams on Admission: CT HEAD WO CONTRAST  Result Date: 07/12/2022 CLINICAL DATA:  Stroke suspected. EXAM:  CT HEAD WITHOUT CONTRAST TECHNIQUE: Contiguous axial images were obtained from the base of the skull through the vertex without intravenous contrast. RADIATION DOSE REDUCTION: This exam was performed according to the departmental dose-optimization program which includes automated exposure control, adjustment of the mA and/or kV according to patient size and/or use of iterative reconstruction technique. COMPARISON:  MRI Head 06/04/22 FINDINGS: Brain: No hemorrhage. Redemonstrated is a large right MCA territory infarct. No CT evidence of new infarct. No hydrocephalus. No extra-axial fluid collection. Unchanged size and shape of the ventricular system with likely ex vacuo dilatation of the right temporal horn. Sequela of mild overall chronic microvascular ischemic change. Vascular: No disproportionately hyperdense vessel. Skull: Normal. Negative for fracture or focal lesion. Sinuses/Orbits: No middle ear or mastoid effusion. Paranasal sinuses are clear. Bilateral lens replacement. Orbits are otherwise unremarkable. Other: None. IMPRESSION: Redemonstrated large right MCA territory infarct. No CT evidence of a new infarct. No hemorrhage. Electronically Signed   By: Marin Roberts M.D.   On: 07/12/2022 16:10      Antonieta Pert MD [facility]  If 7PM-7AM, please contact night-coverage [email or web address]  07/12/2022, 5:36 PM

## 2022-07-13 ENCOUNTER — Inpatient Hospital Stay (HOSPITAL_COMMUNITY)
Admit: 2022-07-13 | Discharge: 2022-07-13 | Disposition: A | Discharge status: Home / Self Care | Payer: Medicare PPO | Attending: Neurology | Admitting: Neurology

## 2022-07-13 ENCOUNTER — Observation Stay (HOSPITAL_BASED_OUTPATIENT_CLINIC_OR_DEPARTMENT_OTHER)
Admit: 2022-07-13 | Discharge: 2022-07-13 | Disposition: A | Discharge status: Home / Self Care | Payer: Medicare PPO | Attending: Internal Medicine | Admitting: Internal Medicine

## 2022-07-13 DIAGNOSIS — G459 Transient cerebral ischemic attack, unspecified: Secondary | ICD-10-CM | POA: Diagnosis not present

## 2022-07-13 DIAGNOSIS — R569 Unspecified convulsions: Principal | ICD-10-CM

## 2022-07-13 DIAGNOSIS — F0390 Unspecified dementia without behavioral disturbance: Secondary | ICD-10-CM | POA: Insufficient documentation

## 2022-07-13 LAB — ECHOCARDIOGRAM COMPLETE
AR max vel: 0.78 cm2
AV Area VTI: 1 cm2
AV Area mean vel: 0.85 cm2
AV Mean grad: 12.7 mmHg
AV Peak grad: 22.9 mmHg
Ao pk vel: 2.39 m/s
Area-P 1/2: 3.91 cm2
Height: 65 in
S' Lateral: 1.9 cm
Weight: 2160 oz

## 2022-07-13 MED ORDER — DIGOXIN 125 MCG PO TABS: 0.1250 mg | ORAL_TABLET | Freq: Every day | ORAL | Status: DC

## 2022-07-13 MED ORDER — LEVETIRACETAM 250 MG PO TABS: ORAL_TABLET | ORAL | 1 refills | Status: DC

## 2022-07-13 MED ORDER — LEVETIRACETAM 500 MG PO TABS
500.0000 mg | ORAL_TABLET | Freq: Once | ORAL | Status: AC
  Administered 2022-07-13: 500 mg via ORAL
  Filled 2022-07-13: qty 1

## 2022-07-13 MED ORDER — METOPROLOL SUCCINATE ER 25 MG PO TB24
25.0000 mg | ORAL_TABLET | Freq: Every day | ORAL | Status: DC
  Administered 2022-07-13: 25 mg via ORAL
  Filled 2022-07-13: qty 1

## 2022-07-13 NOTE — TOC Transition Note (Signed)
Transition of Care [facility]) - CM/SW Discharge Note   Patient Details  Name: Courtney Chan MRN: [id number] Date of Birth: 06-Aug-1930  [facility] of [facility]) CM/SW Contact:  Gerilyn Pilgrim, LCSW Phone Number: 07/13/2022, 2:10 PM   Clinical Narrative:   Pt to discharge today. RW ordered through [employer]. HH ordered. Georgina Snell with [facility] accepted.           Patient Goals and CMS Choice      Discharge Placement                         Discharge Plan and Services Additional resources added to the After Visit Summary for                                       Social Determinants of Health (SDOH) Interventions SDOH Screenings   Food Insecurity: Unknown (07/12/2022)  Housing: Low Risk  (07/12/2022)  Transportation Needs: Unknown (07/12/2022)  Utilities: Unknown (07/12/2022)  Depression (PHQ2-9): Low Risk  (08/27/2019)  Tobacco Use: Low Risk  (07/12/2022)     Readmission Risk Interventions     No data to display

## 2022-07-13 NOTE — Discharge Summary (Signed)
Courtney Chan [id number] DOB: 1931/04/16 DOA: 07/12/2022  PCP: Dion Body, MD  Admit date: 07/12/2022 Discharge date: 07/13/2022  Time spent: 35 minutes  Recommendations for Outpatient Follow-up:  Neurology f/u      Discharge Diagnoses:  Principal Problem:   Focal seizure [facility]) Active Problems:   Acquired hypothyroidism   CKD (chronic kidney disease) stage 3, GFR 30-59 ml/min (HCC)   Essential hypertension   PAD (peripheral artery disease) (HCC)   CHF (congestive heart failure) (HCC)   Atrial fibrillation ([place])   Supplemental oxygen dependent   TIA (transient ischemic attack)   Dementia ([place])   Discharge Condition: stable  Diet recommendation: heart healthy  Filed Weights   07/12/22 1248  Weight: 61.2 kg    History of present illness:  From admission h and p  Courtney Chan 89 year old female with history of moderate dementia, permanent atrial fibrillation with history of stroke, frequent UTI, hypothyroidism, hyperlipidemia, hypertension presented with slurred speech.  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   In the ED vitals:BP 162/75, not hypoxic, heart rate stable CT head redemonstration of large right MCA territory stroke, EKG with A-fib rate controlled.  Labs with fairly stable CBC CMP LDL 9, INR 1.4 UA unremarkable 3  Currently she is resting well no weakness no speech issues. Her hsuband at the bedside  Hospital Course:  Patient presents with acute episode of aphasia with subsequent amnesia. Similar episode last month when she had a  CVA w/u that revealed nothing acute. She has returned to her baseline. She does have a history of ischemic stroke in [id number] complicated by seizures and was treated with keppra at the time, subsequently discontinued. Neurology evaluated and thinks current symptoms are likely partial seizures secondary to prior cerebral infarct. Advised re-starting prior keppra which we have done. Will need outpt f/u with her neurologist. Discharged home with rolling walker and home health pt/ot. Chronic health problems are stable, no changes made to home meds.   Procedures: EEG, final result pending   Consultations: neurology  Discharge Exam: Vitals:   07/13/22 1025 07/13/22 1144  BP: 108/64 (!) 132/54  Pulse: 61 70  Resp:  18  Temp:  (!) 97.3 F (36.3 C)  SpO2:  98%    General: NAD Cardiovascular: RRR Respiratory: CTAB Neuro: no focal deficits  Discharge Instructions   Discharge Instructions     Diet - low sodium heart healthy   Complete by: As directed    Face-to-face encounter (required for Medicare/Medicaid patients)   Complete by: As directed    I Desma Maxim certify that this patient is under my care and that I, or a nurse practitioner or physician's assistant working with me, had a face-to-face encounter that meets the physician face-to-face encounter requirements with this patient on 07/13/2022. The encounter with the patient was in whole, or in part for the following medical condition(s) which is the primary reason for home health care (List medical condition): hx cva   The encounter with the patient was in whole, or in part, for the following medical condition, which is the primary reason  for home health care: hx cva   I certify that, based on my findings, the following services are medically necessary home health services: Physical therapy   Reason for Medically Necessary Home Health Services: Therapy- Therapeutic Exercises to Increase Strength and Endurance   My clinical findings support  the need for the above services: Cognitive impairments, dementia, or mental confusion  that make it unsafe to leave home   Further, I certify that my clinical findings support that this patient is homebound due to: Unsafe ambulation due to balance issues   Home Health   Complete by: As directed    To provide the following care/treatments:  PT OT     Increase activity slowly   Complete by: As directed       Allergies as of 07/13/2022       Reactions   Metronidazole Diarrhea, Nausea And Vomiting   Oxycodone Other (See Comments)   Altered mental status        Medication List     TAKE these medications    atorvastatin 40 MG tablet Commonly known as: LIPITOR Take 1 tablet (40 mg total) by mouth daily at 6 PM.   digoxin 0.125 MG tablet Commonly known as: Digox Take 1 tablet (0.125 mg total) by mouth daily. What changed:  medication strength how much to take how to take this when to take this   Eliquis 5 MG Tabs tablet Generic drug: apixaban Take 5 mg by mouth 2 (two) times daily.   levETIRAcetam 250 MG tablet Commonly known as: Keppra One tablet by mouth every morning and 2 tablets by mouth every evening   levothyroxine 88 MCG tablet Commonly known as: SYNTHROID Take 1 tablet (88 mcg total) by mouth daily.   metoprolol succinate 25 MG 24 hr tablet Commonly known as: TOPROL-XL Take 25 mg by mouth daily.   pantoprazole 40 MG tablet Commonly known as: Protonix Take 1 tablet (40 mg total) by mouth daily.               Durable Medical Equipment  (From admission, onward)           Start     Ordered   07/13/22 1355  For home use only DME Walker rolling  Once       Question Answer Comment  Walker: With 5 Inch Wheels   Patient needs a walker to treat with the following condition CVA (cerebral vascular accident) ([place])      07/13/22 1354           Allergies  Allergen Reactions   Metronidazole Diarrhea and Nausea And Vomiting   Oxycodone Other  (See Comments)    Altered mental status    Follow-up Information     Wonda Cerise, PA-C. Schedule an appointment as soon as possible for a visit.   Specialty: Physician Assistant Contact information: [place] [address] [phone]                  The results of significant diagnostics from this hospitalization (including imaging, microbiology, ancillary and laboratory) are listed below for reference.    Significant Diagnostic Studies: ECHOCARDIOGRAM COMPLETE  Result Date: 07/13/2022    ECHOCARDIOGRAM REPORT   Patient Name:   NAAVA STRUBHAR Date of Exam: 07/13/2022 Medical Rec #:  [id number]        Height:       65.0 in Accession #:    [id number]       Weight:  135.0 lb Date of Birth:  February 26, 1931         BSA:          1.674 m Patient Age:    39 years         BP:           138/67 mmHg Patient Gender: F                HR:           61 bpm. Exam Location:  [facility] Procedure: 2D Echo, Cardiac Doppler and Color Doppler Indications:     TIA G45.9  History:         Patient has prior history of Echocardiogram examinations, most                  recent 06/29/2019. Stroke, Arrythmias:Atrial Fibrillation; Risk                  Factors:Dyslipidemia and Hypertension.  Sonographer:     Sherrie Sport Referring Phys:  [id number] [place] [facility] Diagnosing Phys: Ida Rogue MD IMPRESSIONS  1. Left ventricular ejection fraction, by estimation, is 55 to 60%. The left ventricle has normal function. The left ventricle has no regional wall motion abnormalities. There is mild left ventricular hypertrophy. Left ventricular diastolic parameters are indeterminate.  2. Right ventricular systolic function is mildly reduced. The right ventricular size is normal. There is mildly elevated pulmonary artery systolic pressure. The estimated right ventricular systolic pressure is 123456 mmHg.  3. The mitral valve is normal in structure. Mild mitral valve regurgitation. No evidence of mitral stenosis. Moderate  mitral annular calcification.  4. The aortic valve is normal in structure. There is moderate calcification of the aortic valve. Aortic valve regurgitation is not visualized. Mild aortic valve stenosis. Aortic valve area, by VTI measures 1.00 cm. Aortic valve mean gradient measures 12.7 mmHg. Aortic valve Vmax measures 2.39 m/s.  5. The inferior vena cava is normal in size with greater than 50% respiratory variability, suggesting right atrial pressure of 3 mmHg. FINDINGS  Left Ventricle: Left ventricular ejection fraction, by estimation, is 55 to 60%. The left ventricle has normal function. The left ventricle has no regional wall motion abnormalities. The left ventricular internal cavity size was normal in size. There is  mild left ventricular hypertrophy. Left ventricular diastolic parameters are indeterminate. Right Ventricle: The right ventricular size is normal. No increase in right ventricular wall thickness. Right ventricular systolic function is mildly reduced. There is mildly elevated pulmonary artery systolic pressure. The tricuspid regurgitant velocity  is 2.90 m/s, and with an assumed right atrial pressure of 5 mmHg, the estimated right ventricular systolic pressure is 123456 mmHg. Left Atrium: Left atrial size was normal in size. Right Atrium: Right atrial size was normal in size. Pericardium: There is no evidence of pericardial effusion. Mitral Valve: The mitral valve is normal in structure. Moderate mitral annular calcification. Mild mitral valve regurgitation. No evidence of mitral valve stenosis. Tricuspid Valve: The tricuspid valve is normal in structure. Tricuspid valve regurgitation is mild . No evidence of tricuspid stenosis. Aortic Valve: The aortic valve is normal in structure. There is moderate calcification of the aortic valve. Aortic valve regurgitation is not visualized. Mild aortic stenosis is present. Aortic valve mean gradient measures 12.7 mmHg. Aortic valve peak gradient measures 22.9  mmHg. Aortic valve area, by VTI measures 1.00 cm. Pulmonic Valve: The pulmonic valve was normal in structure. Pulmonic valve regurgitation is not visualized. No evidence of pulmonic stenosis.  Aorta: The aortic root is normal in size and structure. Venous: The inferior vena cava is normal in size with greater than 50% respiratory variability, suggesting right atrial pressure of 3 mmHg. IAS/Shunts: No atrial level shunt detected by color flow Doppler.  LEFT VENTRICLE PLAX 2D LVIDd:         3.00 cm LVIDs:         1.90 cm LV PW:         1.40 cm LV IVS:        1.10 cm LVOT diam:     2.00 cm LV SV:         47 LV SV Index:   28 LVOT Area:     3.14 cm  RIGHT VENTRICLE RV Basal diam:  3.10 cm RV Mid diam:    2.40 cm RV S prime:     9.57 cm/s TAPSE (M-mode): 1.2 cm LEFT ATRIUM           Index        RIGHT ATRIUM           Index LA diam:      3.90 cm 2.33 cm/m   RA Area:     17.40 cm LA Vol (A2C): 23.3 ml 13.92 ml/m  RA Volume:   42.10 ml  25.15 ml/m LA Vol (A4C): 55.6 ml 33.22 ml/m  AORTIC VALVE AV Area (Vmax):    0.78 cm AV Area (Vmean):   0.85 cm AV Area (VTI):     1.00 cm AV Vmax:           239.33 cm/s AV Vmean:          164.333 cm/s AV VTI:            0.473 m AV Peak Grad:      22.9 mmHg AV Mean Grad:      12.7 mmHg LVOT Vmax:         59.10 cm/s LVOT Vmean:        44.400 cm/s LVOT VTI:          0.151 m LVOT/AV VTI ratio: 0.32  AORTA Ao Root diam: 3.20 cm MITRAL VALVE                TRICUSPID VALVE MV Area (PHT): 3.91 cm     TR Peak grad:   33.6 mmHg MV Decel Time: 194 msec     TR Vmax:        290.00 cm/s MV E velocity: 123.00 cm/s                             SHUNTS                             Systemic VTI:  0.15 m                             Systemic Diam: 2.00 cm Ida Rogue MD Electronically signed by Ida Rogue MD Signature Date/Time: 07/13/2022/9:50:57 AM    Final    CT HEAD WO CONTRAST  Result Date: 07/12/2022 CLINICAL DATA:  Stroke suspected. EXAM: CT HEAD WITHOUT CONTRAST TECHNIQUE: Contiguous  axial images were obtained from the base of the skull through the vertex without intravenous contrast. RADIATION DOSE REDUCTION: This exam was performed according to the departmental dose-optimization program which includes automated exposure control, adjustment of  the mA and/or kV according to patient size and/or use of iterative reconstruction technique. COMPARISON:  MRI Head 06/04/22 FINDINGS: Brain: No hemorrhage. Redemonstrated is a large right MCA territory infarct. No CT evidence of new infarct. No hydrocephalus. No extra-axial fluid collection. Unchanged size and shape of the ventricular system with likely ex vacuo dilatation of the right temporal horn. Sequela of mild overall chronic microvascular ischemic change. Vascular: No disproportionately hyperdense vessel. Skull: Normal. Negative for fracture or focal lesion. Sinuses/Orbits: No middle ear or mastoid effusion. Paranasal sinuses are clear. Bilateral lens replacement. Orbits are otherwise unremarkable. Other: None. IMPRESSION: Redemonstrated large right MCA territory infarct. No CT evidence of a new infarct. No hemorrhage. Electronically Signed   By: Marin Roberts M.D.   On: 07/12/2022 16:10    Microbiology: No results found for this or any previous visit (from the past 240 hour(s)).   Labs: Basic Metabolic Panel: Recent Labs  Lab 07/12/22 1250  NA 140  K 4.2  CL 104  CO2 27  GLUCOSE 103*  BUN 17  CREATININE 0.92  CALCIUM 9.8   Liver Function Tests: Recent Labs  Lab 07/12/22 1250  AST 26  ALT 27  ALKPHOS 106  BILITOT 0.7  PROT 7.2  ALBUMIN 3.8   No results for input(s): "LIPASE", "AMYLASE" in the last 168 hours. No results for input(s): "AMMONIA" in the last 168 hours. CBC: Recent Labs  Lab 07/12/22 1250  WBC 8.9  8.5  NEUTROABS 5.6  HGB 14.1  14.0  HCT 45.2  44.5  MCV 97.6  96.1  PLT 301  290   Cardiac Enzymes: No results for input(s): "CKTOTAL", "CKMB", "CKMBINDEX", "TROPONINI" in the last 168  hours. BNP: BNP (last 3 results) No results for input(s): "BNP" in the last 8760 hours.  ProBNP (last 3 results) No results for input(s): "PROBNP" in the last 8760 hours.  CBG: No results for input(s): "GLUCAP" in the last 168 hours.     Signed:  Desma Maxim MD.  [facility] 07/13/2022, 2:06 PM

## 2022-07-13 NOTE — Progress Notes (Signed)
*  PRELIMINARY RESULTS* Echocardiogram 2D Echocardiogram has been performed.  Sherrie Sport 07/13/2022, 8:47 AM

## 2022-07-13 NOTE — Consult Note (Signed)
Neurology Consultation Reason for Consult: Episode of aphasia Referring Physician: Si Raider, N  CC: Episode of aphasia  History is obtained from: Family member, chart  HPI: Courtney Chan is a 89 y.o. female known to me from a previous episode of aphasia that happened about a month ago.  At that time, she had two episodes of which I witnessed one which was much more consistent with aphasia than encephalopathy.  At the time, given the two recurrent events an EEG was performed which was negative.  She did have history of atrial fibrillation, and therefore TIA was considered.  She had subsequently done well since discharge, until she had another transient episode of aphasia.  Lasted for 15 to 20 minutes.  She does have dementia and has problems with her memory, but it was very unusual for her to not remember the episode at all almost immediately after it happened.  She is now back to her normal self.  Past Medical History:  Diagnosis Date   Atrial fibrillation ([place]) 2011   Hyperlipidemia    Hypertension    Stroke [facility])    Thyroid disease      Family History  Problem Relation Age of Onset   Breast cancer Maternal Grandmother        60's     Social History:  reports that she has never smoked. She has never used smokeless tobacco. She reports that she does not drink alcohol and does not use drugs.   Exam: Current vital signs: BP (!) 132/54 (BP Location: Right Arm)   Pulse 70   Temp (!) 97.3 F (36.3 C) (Oral)   Resp 18   Ht '5\' 5"'$  (1.651 m)   Wt 61.2 kg   SpO2 98%   BMI 22.47 kg/m  Vital signs in last 24 hours: Temp:  [97.3 F (36.3 C)-98.6 F (37 C)] 97.3 F (36.3 C) (03/01 1144) Pulse Rate:  [52-72] 70 (03/01 1144) Resp:  [16-20] 18 (03/01 1144) BP: (108-162)/(54-117) 132/54 (03/01 1144) SpO2:  [95 %-99 %] 98 % (03/01 1144) Weight:  [61.2 kg] 61.2 kg (02/29 1248)   Physical Exam  Appears well-developed and well-nourished.   Neuro: Mental Status: Patient is awake,  alert, she is not oriented She is able to name simple objects, able to repeat. Cranial Nerves: II: Visual Fields are full. Pupils are equal, round, and reactive to light.   III,IV, VI: EOMI without ptosis or diploplia.  V: Facial sensation is symmetric to temperature VII: Facial movement is symmetric.  VIII: hearing is intact to voice X: Uvula elevates symmetrically XI: Shoulder shrug is symmetric. XII: tongue is midline without atrophy or fasciculations.  Motor: Tone is normal. Bulk is normal. 5/5 strength was present in all four extremities.  Sensory: Sensation is symmetric to light touch and temperature in the arms and legs. Cerebellar: FNF and HKS are intact bilaterally      I have reviewed labs in epic and the results pertinent to this consultation are: Creatinine 0.92  I have reviewed the images obtained: CT head-negative  Impression: 89 year old female with recurrent stereotyped episodes to which she is immediately amnestic.  Given the recurrent stereotyped character, I do not think TIA is likely.  With her being amnestic to it, I think focal seizure is very likely at this point.  With her age and dementia she is at risk for this.  Transient focal neurological event(TFNE) associated with amyloid would be the primary differential.  I think it is reasonable to start an  antiepileptic to see if this improves her symptoms.  She has previously been on Keppra and was sedated on 500 mg twice daily, but tolerated 250 in the morning and 500 at night fairly well and so I would recommend restarting this dose.  Recommendations: 1) Keppra 250 in the morning and 500 mg at night 2) follow-up with outpatient neurology.   Roland Rack, MD [facility] [phone]  If 7pm- 7am, please page neurology on call as listed in [place].

## 2022-07-13 NOTE — Progress Notes (Signed)
Received MD order to discharge patient to home with home health.  I reviewed discharge instructions, homes meds, prescriptions and follow up appointments with patient and daughter and they verbalized understanding

## 2022-07-13 NOTE — Evaluation (Signed)
Occupational Therapy Evaluation Patient Details Name: Courtney Chan MRN: [id number] DOB: Mar 04, 1931 Today's Date: 07/13/2022   History of Present Illness Courtney Chan 89 year old female with history of moderate dementia, permanent atrial fibrillation with history of  R frontal stroke, frequent UTI, hypothyroidism, hyperlipidemia, hypertension presented with slurred speech. In the ED vitals:BP 162/75, not hypoxic, heart rate stable CT head redemonstration of large right MCA territory stroke, no new acute infarct.   Clinical Impression   Courtney Chan was seen for OT evaluation this date. Prior to hospital admission, pt required some assist from her spouse for LB dressing and occasionally for shower transfers. Otherwise her daughter at bedside states she is able to bathe herself, perform toileting, and HH mobility with a 4WW. Per dtr, pt and spouse walk ~1 mile daily. Pt lives with her spouse in a 1 level home and has a PCA who visits several days a week to assist with light housework. Pt presents to acute OT demonstrating impaired ADL performance and functional mobility 2/2 generalized weakness, decreased activity tolerance, and impaired balance (See OT problem list for additional deficits). Pt currently requires MIN A for bed mobility, close CGA for functional mobility with a RW, and MIN A for LB ADL Management.  Pt would benefit from skilled OT services to address noted impairments and functional limitations (see below for any additional details) in order to maximize safety and independence while minimizing falls risk and caregiver burden. Upon hospital discharge, recommend HHOT to maximize pt safety and return to functional independence during meaningful occupations of daily life.       Recommendations for follow up therapy are one component of a multi-disciplinary discharge planning process, led by the attending physician.  Recommendations may be updated based on patient status, additional  functional criteria and insurance authorization.   Follow Up Recommendations  Home health OT     Assistance Recommended at Discharge Intermittent Supervision/Assistance  Patient can return home with the following A little help with walking and/or transfers;A little help with bathing/dressing/bathroom;Help with stairs or ramp for entrance;Assist for transportation;Assistance with cooking/housework    Functional Status Assessment  Patient has had a recent decline in their functional status and demonstrates the ability to make significant improvements in function in a reasonable and predictable amount of time.  Equipment Recommendations  Other (comment) Librarian, academic (2WW))    Recommendations for Other Services       Precautions / Restrictions Precautions Precautions: Fall Restrictions Weight Bearing Restrictions: No      Mobility Bed Mobility Overal bed mobility: Needs Assistance Bed Mobility: Sit to Supine, Supine to Sit     Supine to sit: Min assist, HOB elevated Sit to supine: Min assist, HOB elevated        Transfers Overall transfer level: Needs assistance Equipment used: Rolling walker (2 wheels) Transfers: Sit to/from Stand Sit to Stand: Min guard           General transfer comment: Min guard for functional mobility in room/hall with RW.      Balance Overall balance assessment: Needs assistance Sitting-balance support: Single extremity supported, Feet supported Sitting balance-Leahy Scale: Fair Sitting balance - Comments: steady static sitting, reaching inside BOS, posterior lean noted with weight shift.   Standing balance support: Reliant on assistive device for balance, During functional activity Standing balance-Leahy Scale: Fair Standing balance comment: BUE support on RW.  ADL either performed or assessed with clinical judgement   ADL Overall ADL's : Needs assistance/impaired                                        General ADL Comments: Functionally limited by generalized weakness and decreased activity tolerance. MIN A for bed mobility and to advance hips toward EOB. CGA for STS with cues for hand/foot placement. Anticipate MIN A for LB ADL management. SET UP for seated UB ADL management.     Vision Patient Visual Report: No change from baseline       Perception     Praxis      Pertinent Vitals/Pain Pain Assessment Pain Assessment: No/denies pain     Hand Dominance Right   Extremity/Trunk Assessment Upper Extremity Assessment Upper Extremity Assessment: Generalized weakness   Lower Extremity Assessment Lower Extremity Assessment: Generalized weakness       Communication Communication Communication: HOH   Cognition Arousal/Alertness: Awake/alert Behavior During Therapy: WFL for tasks assessed/performed Overall Cognitive Status: Within Functional Limits for tasks assessed                                 General Comments: Pleasant, conversational, at baseline per dtr.     General Comments       Exercises Other Exercises Other Exercises: Pt/dtr educated on role of OT in acute setting, safe use of AE/DME for ADL management, routines modifications to support safety and funcitonal independence, and DC recs.   Shoulder Instructions      Home Living Family/patient expects to be discharged to:: Private residence Living Arrangements: Spouse/significant other Available Help at Discharge: Family;Available 24 hours/day;Personal care attendant (Aide visits ~3 hours/day several days a week to help around the house.) Type of Home: House Home Access: Ramped entrance     Home Layout: One level     Bathroom Shower/Tub: Teacher, early years/pre: Standard Bathroom Accessibility: Yes How Accessible: Accessible via walker Home Equipment: Rollator (4 wheels);Cane - single point;BSC/3in1;Wheelchair - manual   Additional Comments: Per dtr,  rollator is broken.      Prior Functioning/Environment Prior Level of Function : Needs assist             Mobility Comments: 4WW for mobility, takes ~1 mile walks daily ADLs Comments: Spouse assists with shower transfers, LB dressing.        OT Problem List: Decreased strength;Decreased coordination;Decreased activity tolerance;Decreased safety awareness;Impaired balance (sitting and/or standing);Decreased knowledge of use of DME or AE      OT Treatment/Interventions: Self-care/ADL training;Therapeutic exercise;Therapeutic activities;DME and/or AE instruction;Patient/family education;Balance training    OT Goals(Current goals can be found in the care plan section) Acute Rehab OT Goals Patient Stated Goal: To go home OT Goal Formulation: With patient Time For Goal Achievement: 07/27/22 Potential to Achieve Goals: Good ADL Goals Pt Will Perform Grooming: sitting;with modified independence Pt Will Perform Lower Body Dressing: sit to/from stand;with supervision;with set-up;with adaptive equipment Pt Will Transfer to Toilet: bedside commode;with set-up;with supervision;ambulating Pt Will Perform Toileting - Clothing Manipulation and hygiene: sit to/from stand;with supervision;with set-up;with adaptive equipment  OT Frequency: Min 2X/week    Co-evaluation              AM-PAC OT "6 Clicks" Daily Activity     Outcome Measure Help from another person eating meals?: None Help  from another person taking care of personal grooming?: A Little Help from another person toileting, which includes using toliet, bedpan, or urinal?: A Little Help from another person bathing (including washing, rinsing, drying)?: A Little Help from another person to put on and taking off regular upper body clothing?: A Little Help from another person to put on and taking off regular lower body clothing?: A Little 6 Click Score: 19   End of Session Equipment Utilized During Treatment: Gait belt;Rolling  walker (2 wheels)  Activity Tolerance: Patient tolerated treatment well Patient left: in bed;with call bell/phone within reach;with bed alarm set;with family/visitor present  OT Visit Diagnosis: Other abnormalities of gait and mobility (R26.89);Muscle weakness (generalized) (M62.81)                Time: [id number] OT Time Calculation (min): 22 min Charges:  OT General Charges $OT Visit: 1 Visit OT Evaluation $OT Eval Low Complexity: 1 Low OT Treatments $Self Care/Home Management : 8-22 mins  Shara Blazing, M.S., OTR/L 07/13/22, 12:19 PM

## 2022-07-13 NOTE — Progress Notes (Signed)
EEG complete - results pending 

## 2022-07-13 NOTE — Progress Notes (Signed)
PT Cancellation Note  Patient Details Name: Courtney Chan MRN: [id number] DOB: [birth year]   Cancelled Treatment:    Reason Eval/Treat Not Completed: Patient at procedure or test/unavailable (Consult received and chart reviewed. Patient currently with staff for EEG.  Will re-attempt at later time/date as medically appropriate and available.)  Talyia Allende H. Owens Shark, PT, DPT, NCS 07/13/22, 10:47 AM [phone]

## 2022-07-13 NOTE — Progress Notes (Addendum)
SLP Cancellation Note  Patient Details Name: Courtney Chan MRN: [id number] DOB: 1930/07/19   Cancelled treatment:       Reason Eval/Treat Not Completed: SLP screened, no needs identified, will sign off (chart reviewed; met w/ NSG then pt/family)  Pt denied any difficulty swallowing and is currently on a regular diet; tolerates swallowing pills w/ water per NSG. Pt conversed in conversation w/out expressive/receptive deficits noted; pt denied any speech-language deficits. Speech clear. Family present in room also and endorsed pt's communication was at her Baseline.  No further skilled ST services indicated as pt appears at her baseline. Pt agreed. NSG to reconsult if any change in status while admitted.      Orinda Kenner, MS, CCC-SLP Speech Language Pathologist Rehab Services; [place] [phone] (ascom) Tiannah Greenly 07/13/2022, 9:27 AM

## 2022-07-13 NOTE — Procedures (Signed)
History: 89 yo F with recurrent episodes of aphasia.   Sedation: none  Technique: This EEG was acquired with electrodes placed according to the International 10-20 electrode system (including Fp1, Fp2, F3, F4, C3, C4, P3, P4, O1, O2, T3, T4, T5, T6, A1, A2, Fz, Cz, Pz). The following electrodes were missing or displaced: none.   Background: The background consists of intermixed alpha and beta activities. There is a well defined posterior dominant rhythm of 8-9 Hz that attenuates with eye opening. There is an increase in slow activity with drowsiness, but sleep is not recorded.   Photic stimulation: Physiologic driving is not performed  EEG Abnormalities: None  Clinical Interpretation: This normal EEG is recorded in the waking and drowsy state. There was no seizure or seizure predisposition recorded on this study. Please note that lack of epileptiform activity on EEG does not preclude the possibility of epilepsy.   Roland Rack, MD [facility] [phone]  If 7pm- 7am, please page neurology on call as listed in [place].

## 2023-03-03 ENCOUNTER — Emergency Department
Admission: EM | Admit: 2023-03-03 | Discharge: 2023-03-03 | Disposition: A | Discharge status: Home / Self Care | Payer: Medicare PPO | Attending: Emergency Medicine | Admitting: Emergency Medicine

## 2023-03-03 ENCOUNTER — Emergency Department: Payer: Medicare PPO

## 2023-03-03 ENCOUNTER — Other Ambulatory Visit: Payer: Self-pay

## 2023-03-03 DIAGNOSIS — S0990XA Unspecified injury of head, initial encounter: Secondary | ICD-10-CM | POA: Diagnosis present

## 2023-03-03 DIAGNOSIS — I4891 Unspecified atrial fibrillation: Secondary | ICD-10-CM | POA: Diagnosis not present

## 2023-03-03 DIAGNOSIS — S52591A Other fractures of lower end of right radius, initial encounter for closed fracture: Secondary | ICD-10-CM | POA: Diagnosis not present

## 2023-03-03 DIAGNOSIS — M549 Dorsalgia, unspecified: Secondary | ICD-10-CM | POA: Diagnosis present

## 2023-03-03 DIAGNOSIS — Y9222 Religious institution as the place of occurrence of the external cause: Secondary | ICD-10-CM | POA: Diagnosis not present

## 2023-03-03 DIAGNOSIS — W010XXA Fall on same level from slipping, tripping and stumbling without subsequent striking against object, initial encounter: Secondary | ICD-10-CM | POA: Diagnosis not present

## 2023-03-03 DIAGNOSIS — S6991XA Unspecified injury of right wrist, hand and finger(s), initial encounter: Secondary | ICD-10-CM | POA: Diagnosis present

## 2023-03-03 DIAGNOSIS — Z7901 Long term (current) use of anticoagulants: Secondary | ICD-10-CM | POA: Diagnosis not present

## 2023-03-03 DIAGNOSIS — M8588 Other specified disorders of bone density and structure, other site: Secondary | ICD-10-CM | POA: Insufficient documentation

## 2023-03-03 DIAGNOSIS — I10 Essential (primary) hypertension: Secondary | ICD-10-CM | POA: Diagnosis not present

## 2023-03-03 DIAGNOSIS — S62101A Fracture of unspecified carpal bone, right wrist, initial encounter for closed fracture: Secondary | ICD-10-CM | POA: Insufficient documentation

## 2023-03-03 DIAGNOSIS — I700 Atherosclerosis of aorta: Secondary | ICD-10-CM | POA: Diagnosis not present

## 2023-03-03 DIAGNOSIS — M1811 Unilateral primary osteoarthritis of first carpometacarpal joint, right hand: Secondary | ICD-10-CM | POA: Diagnosis not present

## 2023-03-03 NOTE — ED Triage Notes (Signed)
[place] EMs from church with c/o fall. Pt states she was pushing a chair and it slipped. Pt states no loc or hitting her head. Pt injured her right wrist and leg. Bandage in place on the leg bleeding controlled.

## 2023-03-03 NOTE — ED Triage Notes (Signed)
Pt in via EMS from church. Pt slipped into a wall, takes no blood thinners, did not hit head. Pt c/o pain to right lower arm. Pt with skin tear to right shin.

## 2023-03-03 NOTE — Discharge Instructions (Addendum)
Please follow-up with orthopedics given your possible right wrist fracture.  Please wear your brace until you have been seen by orthopedics.  Return to the emergency department for any worsening pain or any other symptom concerning to yourself.

## 2023-03-03 NOTE — ED Provider Notes (Addendum)
[facility] Provider Note    Event Date/Time   First MD Initiated Contact with Patient 03/03/23 1211     (approximate)  History   Chief Complaint: Fall  HPI  Courtney Chan is a 89 y.o. female with a past Nikel history of atrial fibrillation on Eliquis, hypertension, hyperlipidemia, prior CVA, presents to the emergency department after mechanical fall.  According to the patient she was at church when she tripped causing her to fall backwards.  Patient does not believe she hit her head.  Patient is experiencing pain to the right wrist as well as pain down her back.  Patient has been ambulatory since the fall.  Physical Exam   Triage Vital Signs: ED Triage Vitals [03/03/23 1140]  Encounter Vitals Group     BP 133/77     Systolic BP Percentile      Diastolic BP Percentile      Pulse Rate 77     Resp 18     Temp 98 F (36.7 C)     Temp src      SpO2 100 %     Weight      Height      Head Circumference      Peak Flow      Pain Score 10     Pain Loc      Pain Education      Exclude from Growth Chart     Most recent vital signs: Vitals:   03/03/23 1140  BP: 133/77  Pulse: 77  Resp: 18  Temp: 98 F (36.7 C)  SpO2: 100%    General: Awake, no distress.  No signs of head trauma CV:  Good peripheral perfusion.  Regular rate and rhythm  Resp:  Normal effort.  Equal breath sounds bilaterally.  Abd:  No distention.  Soft, nontender.  No rebound or guarding. Other:  Good range of motion all extremities.  Mild pain of the right wrist with no obvious swelling or deformity.  Hips are nontender.  Patient does have mild tenderness to the lower T-spine and L-spine.   ED Results / Procedures / Treatments   RADIOLOGY  I have reviewed and interpreted the wrist x-ray images I do not see any obvious significant fracture on my evaluation no angulation or deformity awaiting radiology read. CT scan of the head shows no acute finding.  CT scan of the C-spine  T-spine and L-spine show no fractures. Patient's wrist x-ray shows a subtle impaction fracture of the distal radius.    MEDICATIONS ORDERED IN ED: Medications - No data to display   IMPRESSION / MDM / ASSESSMENT AND PLAN / ED COURSE  I reviewed the triage vital signs and the nursing notes.  Patient's presentation is most consistent with acute presentation with potential threat to life or bodily function.  Patient presents emergency department after mechanical fall.  Patient is 89 years old on Eliquis for atrial fibrillation.  Does not believe she hit her head, no signs of head trauma however given shear forces will obtain CT imaging of the head as a precaution to rule intracranial hemorrhage.  Patient is complaining of pain in her back from the lower T-spine through the L-spine.  States it is mild.  Given the patient's age and pain we will obtain CT imaging through the spine as a precaution as well.  Patient has mild tenderness to palpation of her distal radius and ulna we will obtain x-ray imaging to rule out fracture  although no deformity or swelling on exam.  Patient has a very small skin tear to the right lower extremity no repair needed.  Patient's workup is reassuring besides a possible slight impaction fracture of the distal radius.  We will splint and a sugar-tong splint.  Patient's CTs are negative.  Will discharge from the emergency department.  Patient agreeable to plan of care.  I discussed this with the patient she uses a walker at baseline.  Given the very minimal fracture we will place instead in a right wrist splint/brace and have the patient follow-up with orthopedics.  Patient agreeable to plan of care.  FINAL CLINICAL IMPRESSION(S) / ED DIAGNOSES   Mechanical fall Right wrist fracture    Note:  This document was prepared using Dragon voice recognition software and may include unintentional dictation errors.   Minna Antis, MD 03/03/23 1342    Minna Antis, MD 03/03/23 1344

## 2023-08-03 ENCOUNTER — Emergency Department

## 2023-08-03 ENCOUNTER — Other Ambulatory Visit: Payer: Self-pay

## 2023-08-03 ENCOUNTER — Emergency Department
Admission: EM | Admit: 2023-08-03 | Discharge: 2023-08-03 | Disposition: A | Discharge status: Home / Self Care | Attending: Emergency Medicine | Admitting: Emergency Medicine

## 2023-08-03 DIAGNOSIS — G459 Transient cerebral ischemic attack, unspecified: Secondary | ICD-10-CM | POA: Diagnosis not present

## 2023-08-03 DIAGNOSIS — F0390 Unspecified dementia without behavioral disturbance: Secondary | ICD-10-CM | POA: Insufficient documentation

## 2023-08-03 DIAGNOSIS — I10 Essential (primary) hypertension: Secondary | ICD-10-CM | POA: Diagnosis not present

## 2023-08-03 DIAGNOSIS — Z7901 Long term (current) use of anticoagulants: Secondary | ICD-10-CM | POA: Insufficient documentation

## 2023-08-03 DIAGNOSIS — G9389 Other specified disorders of brain: Secondary | ICD-10-CM | POA: Diagnosis not present

## 2023-08-03 DIAGNOSIS — I4891 Unspecified atrial fibrillation: Secondary | ICD-10-CM | POA: Insufficient documentation

## 2023-08-03 DIAGNOSIS — R9082 White matter disease, unspecified: Secondary | ICD-10-CM | POA: Insufficient documentation

## 2023-08-03 DIAGNOSIS — R531 Weakness: Secondary | ICD-10-CM | POA: Diagnosis present

## 2023-08-03 LAB — CBC WITH DIFFERENTIAL/PLATELET
Abs Immature Granulocytes: 0.04 10*3/uL (ref 0.00–0.07)
Basophils Absolute: 0 10*3/uL (ref 0.0–0.1)
Basophils Relative: 0 %
Eosinophils Absolute: 0.2 10*3/uL (ref 0.0–0.5)
Eosinophils Relative: 2 %
HCT: 43.9 % (ref 36.0–46.0)
Hemoglobin: 14.1 g/dL (ref 12.0–15.0)
Immature Granulocytes: 1 %
Lymphocytes Relative: 20 %
Lymphs Abs: 1.7 10*3/uL (ref 0.7–4.0)
MCH: 30.1 pg (ref 26.0–34.0)
MCHC: 32.1 g/dL (ref 30.0–36.0)
MCV: 93.6 fL (ref 80.0–100.0)
Monocytes Absolute: 0.7 10*3/uL (ref 0.1–1.0)
Monocytes Relative: 8 %
Neutro Abs: 6 10*3/uL (ref 1.7–7.7)
Neutrophils Relative %: 69 %
Platelets: 328 10*3/uL (ref 150–400)
RBC: 4.69 MIL/uL (ref 3.87–5.11)
RDW: 14 % (ref 11.5–15.5)
WBC: 8.6 10*3/uL (ref 4.0–10.5)
nRBC: 0 % (ref 0.0–0.2)

## 2023-08-03 LAB — BASIC METABOLIC PANEL
Anion gap: 6 (ref 5–15)
BUN: 19 mg/dL (ref 8–23)
CO2: 29 mmol/L (ref 22–32)
Calcium: 9.4 mg/dL (ref 8.9–10.3)
Chloride: 101 mmol/L (ref 98–111)
Creatinine, Ser: 0.86 mg/dL (ref 0.44–1.00)
GFR, Estimated: >60 mL/min (ref >60)
Glucose, Bld: 97 mg/dL (ref 70–99)
Potassium: 4.8 mmol/L (ref 3.5–5.1)
Sodium: 136 mmol/L (ref 135–145)

## 2023-08-03 LAB — ETHANOL: Alcohol, Ethyl (B): <10 mg/dL (ref <10)

## 2023-08-03 NOTE — ED Notes (Signed)
 Pt stating she feels fine, has no needs at this time.

## 2023-08-03 NOTE — ED Provider Notes (Signed)
 MR ANGIO NECK WO CONTRAST Result Date: 08/03/2023 CLINICAL DATA:  TIA. EXAM: MRA NECK WITHOUT CONTRAST TECHNIQUE: Angiographic images of the neck were acquired using MRA technique without intravenous contrast. Carotid stenosis measurements (when applicable) are obtained utilizing NASCET criteria, using the distal internal carotid diameter as the denominator. COMPARISON:  CT angio head and neck 06/04/2022 FINDINGS: Aortic arch: Common origin left common carotid artery and innominate artery is present. No significant proximal stenoses are present. Right carotid system: The right common carotid artery is within normal limits. Mild irregularity is present proximal right ICA without definite stenosis. The internal carotid arteries otherwise within normal limits through the ICA terminus. Left carotid system: The left common carotid artery is within normal limits. Bifurcation is unremarkable. Mild proximal tortuosity is present without significant stenosis. The cervical left ICA is otherwise within normal limits through the ICA terminus. Vertebral arteries: Vertebral arteries are codominant. Flow is antegrade in both vertebral arteries. No focal stenosis is present in the neck or proximal basilar artery. Other: None. IMPRESSION: 1. Mild irregularity of the proximal internal carotid arteries bilaterally without significant stenoses 2. No significant stenosis or irregularity of the vertebral arteries in the neck. Electronically Signed   By: Marin Roberts M.D.   On: 08/03/2023 17:04   MR ANGIO HEAD WO CONTRAST Result Date: 08/03/2023 CLINICAL DATA:  Stroke/TIA. EXAM: MRA HEAD WITHOUT CONTRAST TECHNIQUE: Angiographic images of the Circle of Willis were acquired using MRA technique without intravenous contrast. COMPARISON:  MRI of the head without contrast 08/03/2023 FINDINGS: Anterior circulation: Internal carotid arteries are within normal limits from the high cervical segments the ICA termini. The A1 and M1  segments are normal. The anterior communicating artery is patent. MCA bifurcations are intact. Marked attenuation is present in the inferior right M2 branch consistent with remote infarct. The ACA and MCA branch vessels are otherwise within normal limits. No aneurysm is present. Posterior circulation: The visualized basilar artery is normal. The superior cerebellar arteries are patent bilaterally. Both posterior cerebral arteries originate from basilar tip. The PCA branch vessels are within normal limits. Anatomic variants: None Other: None. IMPRESSION: 1. Marked attenuation of the inferior right M2 branch consistent with remote infarct. 2. Otherwise normal MRA Circle of Willis without other significant proximal stenosis, aneurysm, or branch vessel occlusion. Electronically Signed   By: Marin Roberts M.D.   On: 08/03/2023 17:02   MR BRAIN WO CONTRAST Result Date: 08/03/2023 CLINICAL DATA:  TIA. Sudden onset of dysarthria and upper extremity weakness at 1:30 p.m. today. EXAM: MRI HEAD WITHOUT CONTRAST TECHNIQUE: Multiplanar, multiecho pulse sequences of the brain and surrounding structures were obtained without intravenous contrast. COMPARISON:  CT head without contrast 08/03/2023. MR head without contrast 06/04/2022. FINDINGS: Brain: No acute infarct, hemorrhage, or mass lesion is present. Chronic encephalomalacia and volume loss. Is present in the right MCA territory consistent with expected evolution from the prior infarct. No acute hemorrhage or mass lesion is present. The ventricles are proportionate to the degree of atrophy. No significant extraaxial fluid collection is present. The brainstem and cerebellum are within normal limits. Midline structures are within normal limits. The internal auditory canals are within normal limits. Vascular: Flow is present in the major intracranial arteries. Skull and upper cervical spine: The craniocervical junction is normal. Upper cervical spine is within normal  limits. Marrow signal is unremarkable. Sinuses/Orbits: The paranasal sinuses and mastoid air cells are clear. Bilateral lens replacements are noted. Globes and orbits are otherwise unremarkable. IMPRESSION: 1. No acute intracranial abnormality or  significant interval change. 2. Chronic encephalomalacia and volume loss in the right MCA territory consistent with expected evolution from the prior infarct. Electronically Signed   By: Marin Roberts M.D.   On: 08/03/2023 16:50   CT HEAD WO CONTRAST Result Date: 08/03/2023 CLINICAL DATA:  Transient ischemic attack EXAM: CT HEAD WITHOUT CONTRAST TECHNIQUE: Contiguous axial images were obtained from the base of the skull through the vertex without intravenous contrast. RADIATION DOSE REDUCTION: This exam was performed according to the departmental dose-optimization program which includes automated exposure control, adjustment of the mA and/or kV according to patient size and/or use of iterative reconstruction technique. COMPARISON:  03/03/2023 FINDINGS: Brain: Remote right frontal lobe infarct with stable region of encephalomalacia. Periventricular white matter and corona radiata hypodensities favor chronic ischemic microvascular white matter disease. Stable ex vacuo prominence of the lateral ventricles. Otherwise, the brainstem, cerebellum, cerebral peduncles, thalamus, basal ganglia, basilar cisterns, and ventricular system appear within normal limits. No intracranial hemorrhage, mass lesion, or acute CVA. Vascular: There is atherosclerotic calcification of the cavernous carotid arteries bilaterally. Skull: Unremarkable Sinuses/Orbits: Unremarkable Other: No supplemental non-categorized findings. IMPRESSION: 1. No acute intracranial findings. 2. Remote right frontal lobe infarct with stable region of encephalomalacia. 3. Periventricular white matter and corona radiata hypodensities favor chronic ischemic microvascular white matter disease. 4. Atherosclerosis.  Electronically Signed   By: Gaylyn Rong M.D.   On: 08/03/2023 15:00      ----------------------------------------- 6:33 PM on 08/03/2023 ----------------------------------------- Results of all MRIs discussed with Dr. Amada Jupiter.  He advises recommendation for discharge continue her current medications.  Daughter at the bedside also with her both daughter and patient ready for discharge.  This has happened several times in the past, daughter reports that they follow with Dr. Malvin Johns.  Discussed careful return precautions.  At this point neurology advises no medication changes and continue outpatient therapies  Return precautions and treatment recommendations and follow-up discussed with the patient who is agreeable with the plan.    Sharyn Creamer, MD 08/03/23 [phone]

## 2023-08-03 NOTE — ED Notes (Signed)
 Pt daughter updated, still waiting for MRA to result.

## 2023-08-03 NOTE — ED Provider Notes (Signed)
 [facility] Provider Note    Event Date/Time   First MD Initiated Contact with Patient 08/03/23 1405     (approximate)   History   Chief Complaint: Altered Mental Status   HPI  Courtney Chan is a 89 y.o. female with a history of atrial fibrillation on Eliquis, hypertension, prior stroke, dementia who comes ED due to suspected stroke.  She was at her baseline state of health, around 1:30 PM today started having dysarthria and some upper extremity weakness.  This resolved at 1:35 PM per report.  EMS report stroke scale of 0 for them.  Currently patient denies any symptoms, no headache vision change weakness.  No pain.          Physical Exam   Triage Vital Signs: ED Triage Vitals  Encounter Vitals Group     BP      Systolic BP Percentile      Diastolic BP Percentile      Pulse      Resp      Temp      Temp src      SpO2      Weight      Height      Head Circumference      Peak Flow      Pain Score      Pain Loc      Pain Education      Exclude from Growth Chart     Most recent vital signs: Vitals:   08/03/23 1412 08/03/23 1500  BP: (!) 142/94 125/71  Pulse: 100 95  Resp: 18 18  SpO2: 100% 100%    General: Awake, no distress.  CV:  Good peripheral perfusion.  Regular rate Resp:  Normal effort.  Clear to auscultation bilaterally Abd:  No distention.  Soft nontender Other:  No signs of trauma.  Cranial nerves III through XII intact.  GCS 14.  No drift.  Normal sensation.  Normal coordination.  NIH stroke scale 1 for disorientation, chronic.   ED Results / Procedures / Treatments   Labs (all labs ordered are listed, but only abnormal results are displayed) Labs Reviewed  BASIC METABOLIC PANEL  CBC WITH DIFFERENTIAL/PLATELET  ETHANOL  URINALYSIS, W/ REFLEX TO CULTURE (INFECTION SUSPECTED)     EKG Interpreted by me Atrial fibrillation rate of 100.  Normal axis, normal intervals.  Poor R wave progression.  Normal ST  segments and T waves   RADIOLOGY CT head interpreted by me, negative for intracranial hemorrhage or mass.  Radiology report reviewed   PROCEDURES:  Procedures   MEDICATIONS ORDERED IN ED: Medications - No data to display   IMPRESSION / MDM / ASSESSMENT AND PLAN / ED COURSE  I reviewed the triage vital signs and the nursing notes.  DDx: TIA, CVA, anemia, AKI, UTI, electrolyte derangement  Patient's presentation is most consistent with acute presentation with potential threat to life or bodily function.  Patient presents with 5-minute episode of dysarthria, suspicious for TIA.  Already on Eliquis, has dementia, no potential for escalating preventive therapy.  CT head and labs are reassuring.  Will obtain MRI/MRA head neck, if negative for acute stroke or occlusion, she will be stable for discharge home since symptoms were brief and completely resolved back to baseline.       FINAL CLINICAL IMPRESSION(S) / ED DIAGNOSES   Final diagnoses:  TIA (transient ischemic attack)  Chronic dementia (HCC)     Rx / DC Orders  ED Discharge Orders     None        Note:  This document was prepared using Dragon voice recognition software and may include unintentional dictation errors.   Sharman Cheek, MD 08/03/23 [phone]

## 2023-08-03 NOTE — ED Notes (Signed)
 Pt taken to MRI

## 2023-08-03 NOTE — ED Notes (Signed)
 Daughter at bedside, showing a bottle of lorazepam to this RN stating "the neurologist instructed [place] to give if she has a seizure, that's what he thinks is happening when she has these spells."

## 2023-08-03 NOTE — ED Notes (Signed)
 Pt with Hx of dementia, hard for pt to follow commands, pt able to drink water without coughing. Pt did stop drinking several times.

## 2023-08-03 NOTE — ED Triage Notes (Signed)
 Pt BIB [facility] from Home for stroke like symptoms that began around 1330 and resolved 5 min later. Upon EMS arrival pt had full grip strength, scored a 0 on NIHSS. Pt does have a Hx of previous stroke w/o noticeable deficits 2 years ago. Hx of seizures,  Pt given Lorazepam by EMS. Vitals 143/73, O2 around 95% RA, 90's HR, 94 CBG.

## 2023-08-26 NOTE — Progress Notes (Signed)
 Today the history is gathered from: 10% - patient  90% - patient's daughter again  RECORDS SUMMARY: I have reviewed the note dated 06/09/2021 from Alda Carpen, MD who has indicated:  patient with history of CVA 02/2021 and seizure-like activity in 05/2021.  Given these abnormal neurologic findings, a referral to neurology has been recommended.  REFERRING PHYSICIAN: Lane Arthea Locus, MD PRIMARY CARE PHYSICIAN:  Carpen Alda, MD   IMPRESSION/PLAN  Courtney Chan is a 89 y.o. female presenting for evaluation of  SEIZURE LIKE ACTIVITY/ HISTORY OF STROKE / DEMENTIA / IMBALANCE / WEAKNESS / FALLS  Ongoing  Patient with past medical history of CAD, A-Fib, Thyroid  disease, HTN, history of CVA. At last office visit discontinued Keppra . Patient did well for about 9 months, however in the last 2 months patient has had 2 episodes of muffled speech lasting 5 minutes and resolved following Ativan  dose.  No identifiable triggers or changes for these episodes. Discussed at length with patient's daughter and patient the option to resume antiseizure medications, however suspect this would be low yield given age and risk of side effects.  Shared decision was to hold off on making medication changes at this time and instead continue as needed Ativan  and keep a symptom journal. Continue Ativan  1 mg as needed. Can take 1-2 tablets as necessary if having any seizure like activity. Patient should know signs and symptoms of stroke, importance of calling 911 as soon as the symptoms start so patient can be a potential candidate for tPA. We also discussed patient's personal stroke risk factors. We gave written educational material, if needed.   Follow up with Sherran Berliner, PA-C, in 3 months, sooner if needed.    Medications previously tried: Keppra  500 mg (dizziness)  p=4   CHIEF COMPLAINT & HPI  Courtney Chan is a 89 y.o. female presenting for evaluation of: Chief Complaint  Patient presents with   . EIZURE LIKE ACTIVITY/ HISTORY OF STROKE  . DEMENTIA / IMBALANCE / WEAKNESS / FALLS     SEIZURE LIKE ACTIVITY/ HISTORY OF STROKE / DEMENTIA / IMBALANCE / WEAKNESS / FALLS  Patient with past medical history of CAD, A-Fib, Thyroid  disease, HTN, history of CVA. At last office visit discontinued Keppra . Patient did well for about 9 months, however in the last 2 months patient has had 2 episodes of muffled speech lasting 5 minutes and resolved following Ativan  dose.  No identifiable triggers or changes for these episodes.   DATA SUMMARY: 08/05/2022 CT HEAD WO CONTRAST IMPRESSION:  No acute intracranial abnormality.   C SPINE WO CONTRAST IMPRESSION: 1.Negative for acute fracture or listhesis, though exam is limited due to diffuse osseous demineralization. 2.New heterogeneous sclerotic changes within the C7 vertebral body and associated loss of vertebral body height. This is new since October 2022 CTA of the neck, but indeterminate. Underlying lesion cannot be excluded. 3.Multilevel degenerative changes and listhesis as described within the body of the report, not significantly changed compared to 2022 CT.   07/13/2022 EEG  Clinical Interpretation: This normal EEG is recorded in the  waking and drowsy state. There was no seizure or seizure  predisposition recorded on this study. Please note that lack of  epileptiform activity on EEG does not preclude the possibility of  epilepsy.   CT HEAD WO CONTRAST IMPRESSION:  Redemonstrated large right MCA territory infarct. No CT evidence of  a new infarct. No hemorrhage.    06/04/2022 MR BRAIN WO IMPRESSION:  1. No acute intracranial abnormality.  2. Late  subacute to chronic right MCA infarct.  3. Mild chronic small vessel ischemic disease and moderate cerebral  atrophy.   06/05/2022 EEG: Clinical Interpretation: This normal EEG is recorded in the  waking and sleep state. There was no seizure or seizure  predisposition recorded on  this study. Please note that lack of  epileptiform activity on EEG does not preclude the possibility of  epilepsy.    06/04/2022 CTA HEAD NECK and NECK WWO IMPRESSION:  1. No large vessel occlusion. Likely acute occlusion of a prefrontal  branch of the right MCA. No perfusion abnormality per CT brain  perfusion.  2. Approximate 40% narrowing at the origin of the right ICA.  3. Compared to CT cervical spine 02/14/21, there is a new compression  deformity at C7.    06/04/2022 CT HEAD WO IMPRESSION:  1. New moderate-sized area of low attenuation in the right  frontotemporal region most compatible with a subacute right MCA  territory infarct. No evidence of hemorrhage.  2. Chronic microvascular ischemic change and cerebral volume loss.   06/04/2022 CT HEAD WO IMPRESSION:  1. Redemonstrated hypodensity in the right frontal and anterior  temporal lobe, in the right anterior MCA territory, compatible with  an acute to subacute infarct. No new hypodensity to suggest  additional acute infarct.  2. ASPECTS is 5    08/24/2021 ROUTINE EEG IMPRESSION: This routine EEG in the awake and asleep states is within normal limits.   05/28/2021: EEG VIDEO MONITORING This continuous video EEG is abnormal due to the presence of mild diffuse  slowing and greater slowing in the right frontal head region. The diffuse  slowing is indicative of bihemispheric dysfunction such seen in toxic  metabolic or primary neuronal disorders. The greater amount of slowing  with wakefulness in the right frontal head region would suggest an  underlying structural or vascular abnormality. Occasional sharp transients  are noted and thus further monitoring is suggested. Clinical correlation  is advised.   05/17/2021 CT HEAD WO IMPRESSION:  1.New hypodensity involving the right frontal lobe with associated transcortical extension and surrounding edema. Per imaging appearance, this is most consistent with a new  acute right MCA infarct.  2.Sequelae of remote right infarcts and scattered white matter disease.    03/04/2022 EEG VIDEO CLINICAL INTERPRETATION:  This EEG is consistent with a diffuse cerebral dysfunction which  could be secondary to toxic, metabolic, or primary neuronal  disorder  EEG is consistent with  a focal cerebral dysfunction over the  right head regions,with very mild irritation. Such might ne  secondary to a structural or vascular abnormality.  No electrographic or clinical seizures seen.  No seizures seen  02/21/2021 CT HEAD WO IMPRESSION:  1. Continued evolution of acute infarction involving the right lateral frontal and frontoinsular region.  2. No acute intracranial abnormality.  3. Chronic and incidental findings as above.  02/15/2021 CTA NECK W NECK IMPRESSION:  No hemodynamically significant arterial stenosis. 60% stenosis of the proximal right ICA.  Pulmonary edema  02/15/2021 CTA BRAIN W WO IMPRESSION:  Distal right M2 segment MCA occlusion.   02/14/2021 CT CERVICAL SPINE WO IMPRESSION:  CT of the head: Chronic atrophic and ischemic changes without acute  abnormality.  CT of the cervical spine: Multilevel degenerative change with mild  anterolisthesis of C4 on C5. No acute abnormality is noted.     06/30/2019 MR BRAIN WO IMPRESSION:  1. Small acute infarct involving the posterior aspect of the left  superior frontal gyrus.  2.  Encephalomalacia and gliosis involving the posterior right insula  extending into the right temporal lobe.  3. Chronic small vessel ischemia and parenchymal volume loss.     VISIT SUMMARIES:   MEDICATIONS Current Outpatient Medications  Medication Sig Dispense Refill  . albuterol  MDI, PROVENTIL , VENTOLIN , PROAIR , HFA 90 mcg/actuation inhaler INHALE 2 INHALATIONS INTO THE LUNGS EVERY 4 HOURS AS NEEDED FOR WHEEZE OR FOR SHORTNESS OF BREATH 18 each 0  . alendronate (FOSAMAX) 70 MG tablet Take 70 mg by mouth every 7  (seven) days    . atorvastatin  (LIPITOR ) 40 MG tablet TAKE 1 TABLET BY MOUTH EVERY DAY 90 tablet 1  . ELIQUIS  5 mg tablet TAKE 1 TABLET BY MOUTH TWICE A DAY 60 tablet 2  . FUROsemide  (LASIX ) 20 MG tablet Take 1 tablet (20 mg total) by mouth once daily as needed for Edema 90 tablet 3  . levothyroxine  (SYNTHROID ) 75 MCG tablet Take 1 tablet (75 mcg total) by mouth as directed (Sat/Sun) Take on an empty stomach with a glass of water at least 30-60 minutes before breakfast. 30 tablet 1  . levothyroxine  (SYNTHROID ) 88 MCG tablet Take 1 tablet (88 mcg total) by mouth as directed (Monday through Friday) Take on an empty stomach with a glass of water at least 30-60 minutes before breakfast. 60 tablet 1  . LORazepam  (ATIVAN ) 1 MG tablet Take 1-2 tabs as needed if patient is having seizures activity. 10 tablet 0  . metoprolol  SUCCinate (TOPROL -XL) 25 MG XL tablet TAKE 1 TABLET BY MOUTH EVERY DAY 90 tablet 2  . polyethylene glycol (MIRALAX ) powder Take 17 g by mouth once daily as needed    . SENNA LAXATIVE 8.6 mg tablet Take 2 tablets by mouth once daily as needed       . acetaminophen  (TYLENOL ) 325 MG tablet Take 650 mg by mouth every 4 (four) hours as needed for Pain    (Patient not taking: Reported on 08/26/2023)     No current facility-administered medications for this visit.    ALLERGIES Allergies  Allergen Reactions  . Flagyl [Metronidazole Hcl] Diarrhea and Vomiting  . Oxycodone Other (See Comments)    Altered mental status  . Metronidazole Diarrhea and Nausea And Vomiting     EXAM   Vitals:   08/26/23 1344  BP: 111/68  Pulse: 102  Weight: 65.8 kg (145 lb)  Height: 162.6 cm (5' 4)  PainSc: 0-No pain     Body mass index is 24.89 kg/m.  GENERAL:  Pleasant female. No acute distress. Normocephalic and atraumatic.  Baseline neurological exam below was obtained at prior office visit. Changes from today's visit appear in bold.   EYES: PERRLA EOM's intact  MUSCULOSKELETAL: Bulk  - Normal Tone - Normal Pronator Drift - Absent bilaterally. Ambulation - Gait and station is not assessed for safety; in wheelchair  Romberg - not assessed  R/L 3/3    Shoulder abduction (deltoid/supraspinatus, axillary/suprascapular n, C5) 3/3    Elbow flexion (biceps brachii, musculoskeletal n, C5-6) 3/3    Elbow extension (triceps, radial n, C7) 3/3    Finger adduction (interossei, ulnar n, T1)  3/3    Hip flexion (iliopsoas, L1/L2) 3/3    Knee flexion (hamstrings, sciatic n, L5/S1)  3/3    Knee extension (quadriceps, femoral n, L3/4) 3/3    Ankle dorsiflexion (tibialis anterior, deep fibular n, L4/5) 3/3    Ankle plantarflexion (gastroc, tibial n, S1)   NEUROLOGICAL: MENTAL STATUS: Patient is oriented to person, place and time.  Short-term memory is intact Long-term memory is intact.   Attention span and concentration are intact.   Naming and repetition are intact. Comprehension is intact.   Expressive speech is intact.   Patient's fund of knowledge is within normal limits for educational level.  CRANIAL NERVES: Visual acuity and visual fields are intact         Extraocular muscles are intact                        Facial sensation is intact bilaterally                Facial strength is intact bilaterally                   Hearing is intact bilaterally                              Palate elevates midline, normal phonation     Shoulder shrug strength is intact                    Tongue protrudes midline                       SENSATION: Pain and temperature (spinothalamic tracts) is normal. Position and vibration (dorsal columns) is normal.  COORDINATION/CEREBELLAR: Finger to nose testing is intact.  REFLEXES: Negative Hoffman's sign bilaterally.     PAST MEDICAL HISTORY Past Medical History:  Diagnosis Date  . Acute cystitis without hematuria 12/31/2018  . Atrial fibrillation (CMS/HHS-HCC)   . Coronary artery disease    With bare metal stents proximal left  circumflex and mid right coronary artery, followed by Dr. Ammon. Cardiac myoview, 01/20/09- negative  . CVA (cerebral infarction) (CMS/HHS-HCC) 07/2011  . Diarrhea 12/31/2018  . Diverticulosis    Responds well to Augmentin  . GERD (gastroesophageal reflux disease)   . History of stroke   . Hyperlipidemia   . Hypertension   . Mitral valve prolapse   . Thyroid  disease     PAST SURGICAL HISTORY Past Surgical History:  Procedure Laterality Date  . Stent placement surgery  08/12/08  . Partial knee replacement  2012   Dr. Kathlynn  . APPENDECTOMY    . CARDIAC CATHETERIZATION    . HYSTERECTOMY      FAMILY HISTORY Family History  Problem Relation Name Age of Onset  . Myocardial Infarction (Heart attack) Father    . Diabetes type II Sister    . No Known Problems Brother    . No Known Problems Brother    . Myocardial Infarction (Heart attack) Mother    . No Known Problems Son    . No Known Problems Son    . No Known Problems Daughter      SOCIAL HISTORY  Social History   Tobacco Use  . Smoking status: Never    Passive exposure: Never  . Smokeless tobacco: Never  Vaping Use  . Vaping status: Never Used  Substance Use Topics  . Alcohol use: No    Alcohol/week: 0.0 standard drinks of alcohol  . Drug use: No     REVIEW OF SYSTEMS:  13 system ROS was verbally reviewed with patient. Pertinent positives and negatives are mentioned above in the HPI and all other systems are negative.  DATA   Ancillary Orders on 07/18/2023  Component Date Value Ref Range Status  . Glucose 07/18/2023 85  70 - 110 mg/dL Final  . Sodium 96/93/7974 143  136 - 145 mmol/L Final  . Potassium 07/18/2023 4.4  3.6 - 5.1 mmol/L Final  . Chloride 07/18/2023 106  97 - 109 mmol/L Final  . Carbon Dioxide (CO2) 07/18/2023 31.6  22.0 - 32.0 mmol/L Final  . Urea Nitrogen (BUN) 07/18/2023 15  7 - 25 mg/dL Final  . Creatinine 96/93/7974 0.9  0.6 - 1.1 mg/dL Final  . Glomerular Filtration Rate (eGFR) 07/18/2023  61  >60 mL/min/1.73sq m Final  . Calcium  07/18/2023 9.4  8.7 - 10.3 mg/dL Final  . AST  96/93/7974 18  8 - 39 U/L Final  . ALT  07/18/2023 17  5 - 38 U/L Final  . Alk Phos (alkaline Phosphatase) 07/18/2023 126 (H)  34 - 104 U/L Final  . Albumin 07/18/2023 3.8  3.5 - 4.8 g/dL Final  . Bilirubin, Total 07/18/2023 0.6  0.3 - 1.2 mg/dL Final  . Protein, Total 07/18/2023 6.3  6.1 - 7.9 g/dL Final  . A/G Ratio 96/93/7974 1.5  1.0 - 5.0 gm/dL Final  . Hemoglobin J8R 07/18/2023 5.6  4.2 - 5.6 % Final  . Average Blood Glucose (Calc) 07/18/2023 114  mg/dL Final  . Cholesterol, Total 07/18/2023 146  100 - 200 mg/dL Final  . Triglyceride 96/93/7974 96  35 - 199 mg/dL Final  . HDL (High Density Lipoprotein) Cho* 07/18/2023 44.3  35.0 - 85.0 mg/dL Final  . LDL Calculated 07/18/2023 83  0 - 130 mg/dL Final  . VLDL Cholesterol 07/18/2023 19  mg/dL Final  . Cholesterol/HDL Ratio 07/18/2023 3.3   Final  Appointment on 05/28/2023  Component Date Value Ref Range Status  . Thyroid  Stimulating Hormone (TSH) 05/28/2023 2.970  0.450-5.330 uIU/ml uIU/mL Final  . Thyroxine, Free (FT4) 05/28/2023 1.16 (H)  0.66 - 1.14 ng/dL Final  Ancillary Orders on 05/21/2023  Component Date Value Ref Range Status  . Urine Culture, Routine - [facility] 05/21/2023 Final report   Final  . Result 1 - [facility] 05/21/2023 Comment   Final  . Color 05/21/2023 Yellow  Colorless, Straw, Light Yellow, Yellow, Dark Yellow Final  . Clarity 05/21/2023 Clear  Clear Final  . Specific Gravity 05/21/2023 1.026  1.005 - 1.030 Final  . pH, Urine 05/21/2023 5.5  5.0 - 8.0 Final  . Protein, Urinalysis 05/21/2023 Negative  Negative mg/dL Final  . Glucose, Urinalysis 05/21/2023 Negative  Negative mg/dL Final  . Ketones, Urinalysis 05/21/2023 Negative  Negative mg/dL Final  . Blood, Urinalysis 05/21/2023 Negative  Negative Final  . Nitrite, Urinalysis 05/21/2023 Negative  Negative Final  . Leukocyte Esterase, Urinalysis 05/21/2023 Trace (!)  Negative  Final  . Bilirubin, Urinalysis 05/21/2023 Negative  Negative Final  . Urobilinogen, Urinalysis 05/21/2023 0.2  0.2 - 1.0 mg/dL Final  . Mucous, Urine 05/21/2023 PRESENT (!)  None Seen Final  . WBC, UA 05/21/2023 4  <=5 /hpf Final  . Red Blood Cells, Urinalysis 05/21/2023 0  <=3 /hpf Final  . Bacteria, Urinalysis 05/21/2023 0-5  0 - 5 /hpf Final  . Squamous Epithelial Cells, Urinaly* 05/21/2023 3  /hpf Final  Appointment on 03/29/2023  Component Date Value Ref Range Status  . Thyroid  Stimulating Hormone (TSH) 03/29/2023 13.408 (H)  0.450-5.330 uIU/ml uIU/mL Final      No follow-ups on file.  Payor: [employer]AGE PLANS / Plan: [employer] PPO CHOICE / Product Type: PPO /    CELESTE CRAFT CANTWELL, PA  This note is partially written by Lauraine Hales, scribe, in  the presence of and acting as the scribe of [employer], PA-C.     Attestation Statement:   I personally performed the service, non-incident to. (WP)   CELESTE CRAFT CANTWELL, PA

## 2023-09-16 ENCOUNTER — Emergency Department

## 2023-09-16 ENCOUNTER — Emergency Department
Admission: EM | Admit: 2023-09-16 | Discharge: 2023-09-16 | Disposition: A | Discharge status: Home / Self Care | Attending: Emergency Medicine | Admitting: Emergency Medicine

## 2023-09-16 ENCOUNTER — Other Ambulatory Visit: Payer: Self-pay

## 2023-09-16 DIAGNOSIS — I10 Essential (primary) hypertension: Secondary | ICD-10-CM | POA: Diagnosis not present

## 2023-09-16 DIAGNOSIS — I2510 Atherosclerotic heart disease of native coronary artery without angina pectoris: Secondary | ICD-10-CM | POA: Diagnosis not present

## 2023-09-16 DIAGNOSIS — Z79899 Other long term (current) drug therapy: Secondary | ICD-10-CM | POA: Diagnosis not present

## 2023-09-16 DIAGNOSIS — I4891 Unspecified atrial fibrillation: Secondary | ICD-10-CM | POA: Insufficient documentation

## 2023-09-16 DIAGNOSIS — Z7901 Long term (current) use of anticoagulants: Secondary | ICD-10-CM | POA: Insufficient documentation

## 2023-09-16 DIAGNOSIS — R002 Palpitations: Secondary | ICD-10-CM | POA: Diagnosis present

## 2023-09-16 LAB — BASIC METABOLIC PANEL WITH GFR
Anion gap: 10 (ref 5–15)
BUN: 20 mg/dL (ref 8–23)
CO2: 24 mmol/L (ref 22–32)
Calcium: 9.4 mg/dL (ref 8.9–10.3)
Chloride: 106 mmol/L (ref 98–111)
Creatinine, Ser: 0.78 mg/dL (ref 0.44–1.00)
GFR, Estimated: >60 mL/min (ref >60)
Glucose, Bld: 106 mg/dL — ABNORMAL HIGH (ref 70–99)
Potassium: 4.5 mmol/L (ref 3.5–5.1)
Sodium: 140 mmol/L (ref 135–145)

## 2023-09-16 LAB — CBC
HCT: 43.7 % (ref 36.0–46.0)
Hemoglobin: 14.1 g/dL (ref 12.0–15.0)
MCH: 30 pg (ref 26.0–34.0)
MCHC: 32.3 g/dL (ref 30.0–36.0)
MCV: 93 fL (ref 80.0–100.0)
Platelets: 403 10*3/uL — ABNORMAL HIGH (ref 150–400)
RBC: 4.7 MIL/uL (ref 3.87–5.11)
RDW: 14.2 % (ref 11.5–15.5)
WBC: 7.7 10*3/uL (ref 4.0–10.5)
nRBC: 0 % (ref 0.0–0.2)

## 2023-09-16 LAB — TROPONIN I (HIGH SENSITIVITY)
Troponin I (High Sensitivity): 6 ng/L (ref <18)
Troponin I (High Sensitivity): 7 ng/L (ref <18)

## 2023-09-16 MED ORDER — METOPROLOL TARTRATE 5 MG/5ML IV SOLN
5.0000 mg | Freq: Once | INTRAVENOUS | Status: AC
  Administered 2023-09-16: 5 mg via INTRAVENOUS
  Filled 2023-09-16: qty 5

## 2023-09-16 MED ORDER — METOPROLOL TARTRATE 25 MG PO TABS
25.0000 mg | ORAL_TABLET | Freq: Once | ORAL | Status: AC
  Administered 2023-09-16: 25 mg via ORAL
  Filled 2023-09-16: qty 1

## 2023-09-16 MED ORDER — LACTATED RINGERS IV BOLUS
500.0000 mL | Freq: Once | INTRAVENOUS | Status: AC
  Administered 2023-09-16: 500 mL via INTRAVENOUS

## 2023-09-16 NOTE — ED Provider Notes (Signed)
 Signout from Dr. Felipe Horton.  See updated ED course below.   Physical Exam  BP 110/70   Pulse 87   Temp 98 F (36.7 C) (Oral)   Resp 19   Ht 5' 4.5" (1.638 m)   Wt 65.8 kg   SpO2 97%   BMI 24.50 kg/m   Physical Exam  Procedures  Procedures  ED Course / MDM   Clinical Course as of 09/16/23 1617  Mon Sep 16, 2023  1500 S/o: 45F p/w afib [facility] - asx - Rate controlled here in emergency department  Repeat Trope pending.  Stable for discharge and outpatient follow-up assuming second troponin unremarkable.  Anticipate discharge home.  TO DO: - f/u rpt trop [MM]  1616 Reevaluated, resting comfortably in bed, asymptomatic.  Heart rate 70s, blood pressure stable.  Patient and family confirmed that patient does not drink many fluids at baseline that she is likely mildly dehydrated.  Will continue with plan for oral hydration and continue usual home rate control medications with plan for outpatient follow-up for reevaluation.  ED return precautions in place.  Patient family agree with plan. [MM]    Clinical Course User Index [MM] Collis Deaner, MD   Medical Decision Making Amount and/or Complexity of Data Reviewed Labs: ordered.  Risk Prescription drug management.          Collis Deaner, MD 09/16/23 [phone]

## 2023-09-16 NOTE — ED Triage Notes (Signed)
 Patient to ED via POV from [facility] cardiology for a fib rvr. Rate 123 per EKG. PT denies CP or SOB. NAD noted.

## 2023-09-16 NOTE — ED Provider Notes (Signed)
 [facility] Provider Note    Event Date/Time   First MD Initiated Contact with Patient 09/16/23 1415     (approximate)   History   Palpitations   HPI  Courtney Chan is a 89 y.o. female who presents to the ED for evaluation of Palpitations   Review cardiology clinic visit from earlier today.  CAD with multiple stents, V-fib arrest 2021, CVA, HTN, paroxysmal A-fib.  Anticoagulated on Eliquis .  Typically on metoprolol  succinate 25 mg once daily.  Patient presents to the ED alongside her husband and grandson for evaluation of asymptomatic A-fib with RVR.  Presents at the direction of the cardiology clinic.  Reports she feels fine and has no complaints.  No preceding illnesses or concerns from family.  Report they have 70-year wedding anniversary coming up in a couple days.  Upcoming party with family.  No recent falls, injuries, fever, emesis or complaints at home.     Physical Exam   Triage Vital Signs: ED Triage Vitals  Encounter Vitals Group     BP 09/16/23 1152 119/75     Systolic BP Percentile --      Diastolic BP Percentile --      Pulse Rate 09/16/23 1152 (!) 110     Resp 09/16/23 1152 17     Temp 09/16/23 1152 98 F (36.7 C)     Temp Source 09/16/23 1152 Oral     SpO2 09/16/23 1152 95 %     Weight 09/16/23 1157 145 lb (65.8 kg)     Height 09/16/23 1157 5' 4.5" (1.638 m)     Head Circumference --      Peak Flow --      Pain Score 09/16/23 1157 0     Pain Loc --      Pain Education --      Exclude from Growth Chart --     Most recent vital signs: Vitals:   09/16/23 1152  BP: 119/75  Pulse: (!) 110  Resp: 17  Temp: 98 F (36.7 C)  SpO2: 95%    General: Awake, no distress.  CV:  Good peripheral perfusion.  Rapid A-fib, rates in 100s-120s.  systolic murmur is present. Resp:  Normal effort.  Abd:  No distention.  MSK:  No deformity noted.  Neuro:  No focal deficits appreciated. Other:     ED Results / Procedures /  Treatments   Labs (all labs ordered are listed, but only abnormal results are displayed) Labs Reviewed  BASIC METABOLIC PANEL WITH GFR - Abnormal; Notable for the following components:      Result Value   Glucose, Bld 106 (*)    All other components within normal limits  CBC - Abnormal; Notable for the following components:   Platelets 403 (*)    All other components within normal limits  TROPONIN I (HIGH SENSITIVITY)  TROPONIN I (HIGH SENSITIVITY)    EKG A-fib with RVR, rate 123 bpm leftward axis, normal intervals, low amplitude, no STEMI  RADIOLOGY CXR interpreted by me without evidence of acute cardiopulmonary pathology.  Official radiology report(s): DG Chest Port 1 View Result Date: 09/16/2023 CLINICAL DATA:  Chest pain and shortness of breath. EXAM: PORTABLE CHEST 1 VIEW COMPARISON:  Chest radiograph dated 02/14/2021. FINDINGS: Shallow inspiration. No focal consolidation, pleural effusion or pneumothorax. Stable cardiac silhouette. Atherosclerotic calcification of the aorta. No acute osseous pathology. IMPRESSION: No active disease. Electronically Signed   By: Angus Bark M.D.   On: 09/16/2023 12:56  PROCEDURES and INTERVENTIONS:  .1-3 Lead EKG Interpretation  Performed by: Arline Bennett, MD Authorized by: Arline Bennett, MD     Interpretation: abnormal     ECG rate:  113   ECG rate assessment: tachycardic     Rhythm: atrial fibrillation     Ectopy: none     Conduction: normal   .Critical Care  Performed by: Arline Bennett, MD Authorized by: Arline Bennett, MD   Critical care provider statement:    Critical care time (minutes):  30   Critical care time was exclusive of:  Separately billable procedures and treating other patients   Critical care was necessary to treat or prevent imminent or life-threatening deterioration of the following conditions:  Cardiac failure   Critical care was time spent personally by me on the following activities:  Development of  treatment plan with patient or surrogate, discussions with consultants, evaluation of patient's response to treatment, examination of patient, ordering and review of laboratory studies, ordering and review of radiographic studies, ordering and performing treatments and interventions, pulse oximetry, re-evaluation of patient's condition and review of old charts   Medications  lactated ringers  bolus 500 mL (500 mLs Intravenous New Bag/Given 09/16/23 1442)  metoprolol  tartrate (LOPRESSOR ) tablet 25 mg (25 mg Oral Given 09/16/23 1443)  metoprolol  tartrate (LOPRESSOR ) injection 5 mg (5 mg Intravenous Given 09/16/23 1442)     IMPRESSION / MDM / ASSESSMENT AND PLAN / ED COURSE  I reviewed the triage vital signs and the nursing notes.  Differential diagnosis includes, but is not limited to, symptomatic anemia, CHF, medication noncompliance, hypokalemia  {Patient presents with symptoms of an acute illness or injury that is potentially life-threatening.  Pleasantly demented older patient presents with asymptomatic A-fib with RVR with slightly rapid rates.  We will provide small fluid bolus, supplement her metoprolol  at home with small dose of oral and IV metoprolol .  She has normal electrolytes, negative troponin and normal CBC.  Clear CXR.  Pam is eager to take her home, I think this is reasonable.  We will ensure her troponin remains fairly low, rates controlled and plan for outpatient management with close cardiology follow-up.  She had lower blood pressure in the cardiology clinic, more normotensive here.  Family is concerned about dehydration, and a mild degree of hypovolemia may be contributing.  Clinical Course as of 09/16/23 1511  Mon Sep 16, 2023  1500 S/o: 74F p/w afib rvr from [facility] - asx -  [MM]    Clinical Course User Index [MM] Collis Deaner, MD     FINAL CLINICAL IMPRESSION(S) / ED DIAGNOSES   Final diagnoses:  Atrial fibrillation with RVR (HCC)     Rx / DC Orders    ED Discharge Orders     None        Note:  This document was prepared using Dragon voice recognition software and may include unintentional dictation errors.   Arline Bennett, MD 09/16/23 [phone]

## 2023-09-16 NOTE — Discharge Instructions (Signed)
 Continue all of your medications for A-fib.   Push fluids at home to try to keep her hydrated  Return to the ED with any worsening symptoms despite these measures  Happy upcoming wedding anniversary!!

## 2024-04-12 ENCOUNTER — Emergency Department

## 2024-04-12 ENCOUNTER — Ambulatory Visit
Admission: EM | Admit: 2024-04-12 | Discharge: 2024-04-12 | Disposition: A | Discharge status: Home / Self Care | Attending: Emergency Medicine | Admitting: Emergency Medicine

## 2024-04-12 ENCOUNTER — Ambulatory Visit

## 2024-04-12 ENCOUNTER — Observation Stay: Admission: EM | Admit: 2024-04-12 | Discharge: 2024-04-14 | Disposition: A

## 2024-04-12 ENCOUNTER — Encounter: Payer: Self-pay | Admitting: Emergency Medicine

## 2024-04-12 ENCOUNTER — Other Ambulatory Visit: Payer: Self-pay

## 2024-04-12 ENCOUNTER — Observation Stay

## 2024-04-12 DIAGNOSIS — R4781 Slurred speech: Secondary | ICD-10-CM | POA: Diagnosis present

## 2024-04-12 DIAGNOSIS — F0390 Unspecified dementia without behavioral disturbance: Secondary | ICD-10-CM | POA: Insufficient documentation

## 2024-04-12 DIAGNOSIS — E039 Hypothyroidism, unspecified: Secondary | ICD-10-CM | POA: Insufficient documentation

## 2024-04-12 DIAGNOSIS — I639 Cerebral infarction, unspecified: Secondary | ICD-10-CM | POA: Diagnosis not present

## 2024-04-12 DIAGNOSIS — I5A Non-ischemic myocardial injury (non-traumatic): Secondary | ICD-10-CM | POA: Diagnosis not present

## 2024-04-12 DIAGNOSIS — Z79899 Other long term (current) drug therapy: Secondary | ICD-10-CM | POA: Diagnosis not present

## 2024-04-12 DIAGNOSIS — E785 Hyperlipidemia, unspecified: Secondary | ICD-10-CM | POA: Diagnosis not present

## 2024-04-12 DIAGNOSIS — Z7901 Long term (current) use of anticoagulants: Secondary | ICD-10-CM | POA: Diagnosis not present

## 2024-04-12 DIAGNOSIS — I4891 Unspecified atrial fibrillation: Secondary | ICD-10-CM | POA: Diagnosis not present

## 2024-04-12 DIAGNOSIS — I10 Essential (primary) hypertension: Secondary | ICD-10-CM | POA: Diagnosis present

## 2024-04-12 DIAGNOSIS — N1831 Chronic kidney disease, stage 3a: Secondary | ICD-10-CM | POA: Insufficient documentation

## 2024-04-12 DIAGNOSIS — J22 Unspecified acute lower respiratory infection: Secondary | ICD-10-CM

## 2024-04-12 DIAGNOSIS — Z1152 Encounter for screening for COVID-19: Secondary | ICD-10-CM | POA: Insufficient documentation

## 2024-04-12 DIAGNOSIS — R051 Acute cough: Secondary | ICD-10-CM | POA: Diagnosis not present

## 2024-04-12 DIAGNOSIS — I130 Hypertensive heart and chronic kidney disease with heart failure and stage 1 through stage 4 chronic kidney disease, or unspecified chronic kidney disease: Secondary | ICD-10-CM | POA: Insufficient documentation

## 2024-04-12 DIAGNOSIS — R059 Cough, unspecified: Secondary | ICD-10-CM | POA: Diagnosis not present

## 2024-04-12 DIAGNOSIS — R569 Unspecified convulsions: Secondary | ICD-10-CM | POA: Insufficient documentation

## 2024-04-12 DIAGNOSIS — I6389 Other cerebral infarction: Principal | ICD-10-CM | POA: Insufficient documentation

## 2024-04-12 DIAGNOSIS — I5032 Chronic diastolic (congestive) heart failure: Secondary | ICD-10-CM | POA: Diagnosis present

## 2024-04-12 LAB — URINALYSIS, ROUTINE W REFLEX MICROSCOPIC
Bilirubin Urine: NEGATIVE
Glucose, UA: NEGATIVE mg/dL
Hgb urine dipstick: NEGATIVE
Ketones, ur: NEGATIVE mg/dL
Leukocytes,Ua: NEGATIVE
Nitrite: NEGATIVE
Protein, ur: NEGATIVE mg/dL
Specific Gravity, Urine: 1.009 (ref 1.005–1.030)
pH: 5 (ref 5.0–8.0)

## 2024-04-12 LAB — POC COVID19/FLU A&B COMBO
Covid Antigen, POC: NEGATIVE
Influenza A Antigen, POC: NEGATIVE
Influenza B Antigen, POC: NEGATIVE

## 2024-04-12 LAB — COMPREHENSIVE METABOLIC PANEL WITH GFR
ALT: 14 U/L (ref 0–44)
AST: 25 U/L (ref 15–41)
Albumin: 3.9 g/dL (ref 3.5–5.0)
Alkaline Phosphatase: 147 U/L — ABNORMAL HIGH (ref 38–126)
Anion gap: 9 (ref 5–15)
BUN: 17 mg/dL (ref 8–23)
CO2: 27 mmol/L (ref 22–32)
Calcium: 9.7 mg/dL (ref 8.9–10.3)
Chloride: 102 mmol/L (ref 98–111)
Creatinine, Ser: 0.82 mg/dL (ref 0.44–1.00)
GFR, Estimated: >60 mL/min (ref >60)
Glucose, Bld: 97 mg/dL (ref 70–99)
Potassium: 4.5 mmol/L (ref 3.5–5.1)
Sodium: 139 mmol/L (ref 135–145)
Total Bilirubin: 0.6 mg/dL (ref 0.0–1.2)
Total Protein: 7.3 g/dL (ref 6.5–8.1)

## 2024-04-12 LAB — CBC WITH DIFFERENTIAL/PLATELET
Abs Immature Granulocytes: 0.03 K/uL (ref 0.00–0.07)
Basophils Absolute: 0 K/uL (ref 0.0–0.1)
Basophils Relative: 0 %
Eosinophils Absolute: 0.2 K/uL (ref 0.0–0.5)
Eosinophils Relative: 2 %
HCT: 42.2 % (ref 36.0–46.0)
Hemoglobin: 13.5 g/dL (ref 12.0–15.0)
Immature Granulocytes: 0 %
Lymphocytes Relative: 25 %
Lymphs Abs: 2.1 K/uL (ref 0.7–4.0)
MCH: 30.6 pg (ref 26.0–34.0)
MCHC: 32 g/dL (ref 30.0–36.0)
MCV: 95.7 fL (ref 80.0–100.0)
Monocytes Absolute: 0.7 K/uL (ref 0.1–1.0)
Monocytes Relative: 8 %
Neutro Abs: 5.3 K/uL (ref 1.7–7.7)
Neutrophils Relative %: 65 %
Platelets: 339 K/uL (ref 150–400)
RBC: 4.41 MIL/uL (ref 3.87–5.11)
RDW: 15.9 % — ABNORMAL HIGH (ref 11.5–15.5)
WBC: 8.3 K/uL (ref 4.0–10.5)
nRBC: 0 % (ref 0.0–0.2)

## 2024-04-12 LAB — PRO BRAIN NATRIURETIC PEPTIDE: Pro Brain Natriuretic Peptide: 799 pg/mL — ABNORMAL HIGH (ref <300.0)

## 2024-04-12 LAB — DIGOXIN LEVEL: Digoxin Level: <0.6 ng/mL — ABNORMAL LOW (ref 0.8–2.0)

## 2024-04-12 LAB — RESP PANEL BY RT-PCR (RSV, FLU A&B, COVID)  RVPGX2
Influenza A by PCR: NEGATIVE
Influenza B by PCR: NEGATIVE
Resp Syncytial Virus by PCR: NEGATIVE
SARS Coronavirus 2 by RT PCR: NEGATIVE

## 2024-04-12 LAB — TROPONIN T, HIGH SENSITIVITY
Troponin T High Sensitivity: 29 ng/L — ABNORMAL HIGH (ref 0–19)
Troponin T High Sensitivity: 29 ng/L — ABNORMAL HIGH (ref 0–19)

## 2024-04-12 LAB — PROTIME-INR
INR: 1.4 — ABNORMAL HIGH (ref 0.8–1.2)
Prothrombin Time: 17.7 s — ABNORMAL HIGH (ref 11.4–15.2)

## 2024-04-12 LAB — APTT: aPTT: 36 s (ref 24–36)

## 2024-04-12 LAB — ETHANOL: Alcohol, Ethyl (B): <15 mg/dL (ref <15)

## 2024-04-12 MED ORDER — METOPROLOL SUCCINATE ER 25 MG PO TB24
25.0000 mg | ORAL_TABLET | Freq: Once | ORAL | Status: AC
  Administered 2024-04-12: 25 mg via ORAL
  Filled 2024-04-12: qty 1

## 2024-04-12 MED ORDER — SODIUM CHLORIDE 0.9 % IV SOLN: INTRAVENOUS | Status: DC

## 2024-04-12 MED ORDER — IPRATROPIUM-ALBUTEROL 0.5-2.5 (3) MG/3ML IN SOLN
3.0000 mL | Freq: Two times a day (BID) | RESPIRATORY_TRACT | Status: DC
  Administered 2024-04-13: 3 mL via RESPIRATORY_TRACT
  Filled 2024-04-12: qty 3

## 2024-04-12 MED ORDER — ALBUTEROL SULFATE (2.5 MG/3ML) 0.083% IN NEBU: 2.5000 mg | INHALATION_SOLUTION | RESPIRATORY_TRACT | Status: DC | PRN

## 2024-04-12 MED ORDER — ACETAMINOPHEN 650 MG RE SUPP: 650.0000 mg | RECTAL | Status: DC | PRN

## 2024-04-12 MED ORDER — IPRATROPIUM-ALBUTEROL 0.5-2.5 (3) MG/3ML IN SOLN
3.0000 mL | Freq: Four times a day (QID) | RESPIRATORY_TRACT | Status: DC
  Administered 2024-04-12: 3 mL via RESPIRATORY_TRACT
  Filled 2024-04-12: qty 3

## 2024-04-12 MED ORDER — ACETAMINOPHEN 325 MG PO TABS
650.0000 mg | ORAL_TABLET | ORAL | Status: DC | PRN
  Administered 2024-04-13: 650 mg via ORAL
  Filled 2024-04-12: qty 2

## 2024-04-12 MED ORDER — ONDANSETRON HCL 4 MG/2ML IJ SOLN: 4.0000 mg | Freq: Three times a day (TID) | INTRAMUSCULAR | Status: DC | PRN

## 2024-04-12 MED ORDER — AMOXICILLIN-POT CLAVULANATE 875-125 MG PO TABS: 1.0000 | ORAL_TABLET | Freq: Once | ORAL | Status: DC

## 2024-04-12 MED ORDER — LEVOTHYROXINE SODIUM 88 MCG PO TABS
88.0000 ug | ORAL_TABLET | Freq: Every day | ORAL | Status: DC
  Administered 2024-04-13 – 2024-04-14 (×2): 88 ug via ORAL
  Filled 2024-04-12 (×2): qty 1

## 2024-04-12 MED ORDER — ACETAMINOPHEN 160 MG/5ML PO SOLN: 650.0000 mg | ORAL | Status: DC | PRN

## 2024-04-12 MED ORDER — HYDRALAZINE HCL 20 MG/ML IJ SOLN: 5.0000 mg | INTRAMUSCULAR | Status: DC | PRN

## 2024-04-12 MED ORDER — BENZONATATE 100 MG PO CAPS: 200.0000 mg | ORAL_CAPSULE | Freq: Three times a day (TID) | ORAL | 0 refills | Status: AC

## 2024-04-12 MED ORDER — SENNOSIDES-DOCUSATE SODIUM 8.6-50 MG PO TABS: 1.0000 | ORAL_TABLET | Freq: Every evening | ORAL | Status: DC | PRN

## 2024-04-12 MED ORDER — METOPROLOL TARTRATE 5 MG/5ML IV SOLN
2.5000 mg | INTRAVENOUS | Status: DC | PRN
  Administered 2024-04-12: 2.5 mg via INTRAVENOUS
  Filled 2024-04-12: qty 5

## 2024-04-12 MED ORDER — LORAZEPAM 2 MG/ML IJ SOLN: 2.0000 mg | INTRAMUSCULAR | Status: DC | PRN

## 2024-04-12 MED ORDER — PROMETHAZINE-DM 6.25-15 MG/5ML PO SYRP: 2.5000 mL | ORAL_SOLUTION | Freq: Four times a day (QID) | ORAL | 0 refills | Status: AC | PRN

## 2024-04-12 MED ORDER — ATORVASTATIN CALCIUM 20 MG PO TABS: 40.0000 mg | ORAL_TABLET | Freq: Every day | ORAL | Status: DC

## 2024-04-12 MED ORDER — AMOXICILLIN-POT CLAVULANATE 875-125 MG PO TABS: 1.0000 | ORAL_TABLET | Freq: Two times a day (BID) | ORAL | 0 refills | Status: DC

## 2024-04-12 MED ORDER — METOPROLOL SUCCINATE ER 25 MG PO TB24
25.0000 mg | ORAL_TABLET | Freq: Every day | ORAL | Status: DC
  Administered 2024-04-13: 25 mg via ORAL
  Filled 2024-04-12: qty 1

## 2024-04-12 MED ORDER — DM-GUAIFENESIN ER 30-600 MG PO TB12: 1.0000 | ORAL_TABLET | Freq: Two times a day (BID) | ORAL | Status: DC | PRN

## 2024-04-12 MED ORDER — SODIUM CHLORIDE 0.9 % IV BOLUS
500.0000 mL | Freq: Once | INTRAVENOUS | Status: AC
  Administered 2024-04-12: 500 mL via INTRAVENOUS

## 2024-04-12 MED ORDER — IOHEXOL 350 MG/ML SOLN
75.0000 mL | Freq: Once | INTRAVENOUS | Status: AC | PRN
  Administered 2024-04-12: 75 mL via INTRAVENOUS

## 2024-04-12 MED ORDER — ASPIRIN 325 MG PO TBEC
325.0000 mg | DELAYED_RELEASE_TABLET | Freq: Every day | ORAL | Status: DC
  Administered 2024-04-12 – 2024-04-13 (×2): 325 mg via ORAL
  Filled 2024-04-12 (×2): qty 1

## 2024-04-12 MED ORDER — AEROCHAMBER MV MISC: 2 refills | Status: AC

## 2024-04-12 MED ORDER — ALBUTEROL SULFATE HFA 108 (90 BASE) MCG/ACT IN AERS: 2.0000 | INHALATION_SPRAY | RESPIRATORY_TRACT | 0 refills | Status: AC | PRN

## 2024-04-12 MED ORDER — HEPARIN SODIUM (PORCINE) 5000 UNIT/ML IJ SOLN
5000.0000 [IU] | Freq: Three times a day (TID) | INTRAMUSCULAR | Status: DC
  Administered 2024-04-12 – 2024-04-13 (×2): 5000 [IU] via SUBCUTANEOUS
  Filled 2024-04-12 (×2): qty 1

## 2024-04-12 MED ORDER — STROKE: EARLY STAGES OF RECOVERY BOOK: Freq: Once | Status: AC

## 2024-04-12 NOTE — H&P (Signed)
 History and Physical    Courtney Chan FMW:[id number] DOB: Feb 17, 1931 DOA: 04/12/2024  Referring MD/NP/PA:   PCP: Alla Amis, MD   Patient coming from:  The patient is coming from home.     Chief Complaint: Garbled speech  HPI: Courtney Chan is a 89 y.o. female with medical history significant of stroke and TIA, focal seizure, HTN, HLD, PVD, dCHF, hypothyroidism, GERD, dementia, CKD-3a, A-fib on Eliquis , cardiac arrest, who presents with garbled speech.  Per her daughter at the bedside, pt went to bed at 19:00 yesterday and was her normal self. Upon waking at 0800 today, pt was noted to have cough with slow response and garbled speech. Per her daughter, patient has history of focal seizure, with intermittent garbled speech due to focal seizure. She is not sure if patient's garbled speech today is completely new or not. Pt was seen in UC and had chest x-ray which was negative for pneumonia.  Per report, patient had wheezing, but when I saw patient in the ED, patient does not have wheezing on my examination.  She has dry cough, and denies chest pain or SOB.  No nausea, vomiting, diarrhea or abdominal pain.  No symptoms UTI.  No fever or chills.  Patient does not have unilateral numbness or tingling in extremities.  No facial droop.  Data reviewed independently and ED Course: pt was found to have WBC 8.3, GFR > 60, negative UA, negative PCR for COVID, flu and RSV, INR 1.4, PTT 36, troponin 29 --> 29, proBNP 799.  Temperature normal, blood pressure 125/76, heart rate 110-120s, RR 19, oxygen saturation 95-100% on room air.  Chest x-ray negative.  CT of head negative for acute intracranial abnormalities but showed old infarction.  MRI is positive for stroke.  Patient is placed in telemetry bed for outpatient.  CT of head: No acute CT finding. Old infarction in the right insula and frontal operculum. Chronic small-vessel ischemic changes of the hemispheric white matter, moderate to  advanced.   MRI for brain: 1. Acute linear 7 mm infarct in the left thalamus. 2. Remote right MCA territory infarct with ex vacuo dilation of the right lateral ventricle. 3. Stable atrophy and white matter disease.  CTA of head and neck: 1. Negative CTA for large vessel occlusion or other emergent finding. 2. Atheromatous irregularity about the PCAs with associated mild right and mild-to-moderate left P2 stenoses. 3. Additional mild atheromatous change about the carotid bifurcations and carotid siphons without hemodynamically significant stenosis.   Aortic Atherosclerosis (ICD10-I70.0).    EKG: I have personally reviewed.  A-fib, QTc 457, heart rate of 110, LAD, poor R wave progression, low voltage   Review of Systems:   General: no fevers, chills, no body weight gain, has fatigue HEENT: no blurry vision, hearing changes or sore throat Respiratory: no dyspnea, has coughing, wheezing CV: no chest pain, no palpitations GI: no nausea, vomiting, abdominal pain, diarrhea, constipation GU: no dysuria, burning on urination, increased urinary frequency, hematuria  Ext: no leg edema Neuro: no unilateral weakness, numbness, or tingling, no vision change or hearing loss. Has garbled speech Skin: no rash, no skin tear. MSK: No muscle spasm, no deformity, no limitation of range of movement in spin Heme: No easy bruising.  Travel history: No recent long distant travel.   Allergy:  Allergies  Allergen Reactions   Metronidazole Diarrhea and Nausea And Vomiting   Oxycodone Other (See Comments)    Altered mental status    Past Medical History:  Diagnosis  Date   Atrial fibrillation (HCC) 2011   Hyperlipidemia    Hypertension    Stroke [facility])    Thyroid  disease     Past Surgical History:  Procedure Laterality Date   ABDOMINAL HYSTERECTOMY     BREAST BIOPSY Left 1980   Negative   CORONARY/GRAFT ACUTE MI REVASCULARIZATION N/A 06/28/2019   Procedure: Coronary/Graft Acute MI  Revascularization;  Surgeon: Anner Alm ORN, MD;  Location: [facility] INVASIVE CV LAB;  Service: Cardiovascular;  Laterality: N/A;   LEFT HEART CATH AND CORONARY ANGIOGRAPHY N/A 06/28/2019   Procedure: LEFT HEART CATH AND CORONARY ANGIOGRAPHY;  Surgeon: Anner Alm ORN, MD;  Location: [facility] INVASIVE CV LAB;  Service: Cardiovascular;  Laterality: N/A;    Social History:  reports that she has never smoked. She has never used smokeless tobacco. She reports that she does not drink alcohol and does not use drugs.  Family History:  Family History  Problem Relation Age of Onset   Breast cancer Maternal Grandmother        60's     Prior to Admission medications   Medication Sig Start Date End Date Taking? Authorizing Provider  albuterol  (VENTOLIN  HFA) 108 (90 Base) MCG/ACT inhaler Inhale 2 puffs into the lungs every 4 (four) hours as needed. 04/12/24   Bernardino Ditch, NP  atorvastatin  (LIPITOR ) 40 MG tablet Take 1 tablet (40 mg total) by mouth daily at 6 PM. 06/05/22 09/03/22  Von Bellis, MD  benzonatate (TESSALON) 100 MG capsule Take 2 capsules (200 mg total) by mouth every 8 (eight) hours. 04/12/24   Bernardino Ditch, NP  digoxin  (DIGOX ) 0.125 MG tablet Take 1 tablet (0.125 mg total) by mouth daily. 07/13/22   Wouk, Devaughn Sayres, MD  ELIQUIS  5 MG TABS tablet Take 5 mg by mouth 2 (two) times daily.    [provider]  levETIRAcetam  (KEPPRA ) 250 MG tablet One tablet by mouth every morning and 2 tablets by mouth every evening 07/13/22   Wouk, Devaughn Sayres, MD  levothyroxine  (SYNTHROID ) 88 MCG tablet Take 1 tablet (88 mcg total) by mouth daily. 07/10/19   Angiulli, Toribio PARAS, PA-C  metoprolol  succinate (TOPROL -XL) 25 MG 24 hr tablet Take 25 mg by mouth daily. 06/01/22   [provider]  pantoprazole  (PROTONIX ) 40 MG tablet Take 1 tablet (40 mg total) by mouth daily. 04/04/20 04/04/21  Alain Deward FALCON, MD  promethazine-dextromethorphan  (PROMETHAZINE-DM) 6.25-15 MG/5ML syrup Take 2.5 mLs by mouth 4  (four) times daily as needed. 04/12/24   Bernardino Ditch, NP  Spacer/Aero-Holding Rag[place] (AEROCHAMBER MV) inhaler Use as instructed 04/12/24   Bernardino Ditch, NP    Physical Exam: Vitals:   04/12/24 1840 04/12/24 1943 04/12/24 2018 04/12/24 2054  BP:  138/85 130/66   Pulse:  (!) 110 (!) 109   Resp:  20    Temp: 97.8 F (36.6 C)  (!) 97.5 F (36.4 C)   TempSrc: Oral     SpO2:  97% 96% 96%  Weight:   58.7 kg   Height:   5' 4.5 (1.638 m)    General: Not in acute distress HEENT:       Eyes: PERRL, EOMI, no jaundice       ENT: No discharge from the ears and nose, no pharynx injection, no tonsillar enlargement.        Neck: No JVD, no bruit, no mass felt. Heme: No neck lymph node enlargement. Cardiac: S1/S2, irregularly irregular rhythm, No gallops or rubs. Respiratory: No rales, wheezing, rhonchi or rubs. GI: Soft,  nondistended, nontender, no rebound pain, no organomegaly, BS present. GU: No hematuria Ext: No pitting leg edema bilaterally. 1+DP/PT pulse bilaterally. Musculoskeletal: No joint deformities, No joint redness or warmth, no limitation of ROM in spin. Skin: No rashes.  Neuro: Alert, following command, cranial nerves II-XII grossly intact, moves all extremities normally. Muscle strength 5/5 in all extremities, sensation to light touch intact.  Psych: Patient is not psychotic, no suicidal or hemocidal ideation.  Labs on Admission: I have personally reviewed following labs and imaging studies  CBC: Recent Labs  Lab 04/12/24 1343  WBC 8.3  NEUTROABS 5.3  HGB 13.5  HCT 42.2  MCV 95.7  PLT 339   Basic Metabolic Panel: Recent Labs  Lab 04/12/24 1343  NA 139  K 4.5  CL 102  CO2 27  GLUCOSE 97  BUN 17  CREATININE 0.82  CALCIUM  9.7   GFR: Estimated Creatinine Clearance: 39.4 mL/min (by C-G formula based on SCr of 0.82 mg/dL). Liver Function Tests: Recent Labs  Lab 04/12/24 1343  AST 25  ALT 14  ALKPHOS 147*  BILITOT 0.6  PROT 7.3  ALBUMIN 3.9   No  results for input(s): LIPASE, AMYLASE in the last 168 hours. No results for input(s): AMMONIA in the last 168 hours. Coagulation Profile: Recent Labs  Lab 04/12/24 1343  INR 1.4*   Cardiac Enzymes: No results for input(s): CKTOTAL, CKMB, CKMBINDEX, TROPONINI in the last 168 hours. BNP (last 3 results) Recent Labs    04/12/24 1343  PROBNP 799.0*   HbA1C: No results for input(s): HGBA1C in the last 72 hours. CBG: No results for input(s): GLUCAP in the last 168 hours. Lipid Profile: No results for input(s): CHOL, HDL, LDLCALC, TRIG, CHOLHDL, LDLDIRECT in the last 72 hours. Thyroid  Function Tests: No results for input(s): TSH, T4TOTAL, FREET4, T3FREE, THYROIDAB in the last 72 hours. Anemia Panel: No results for input(s): VITAMINB12, FOLATE, FERRITIN, TIBC, IRON, RETICCTPCT in the last 72 hours. Urine analysis:    Component Value Date/Time   COLORURINE YELLOW (A) 04/12/2024 1600   APPEARANCEUR CLEAR (A) 04/12/2024 1600   APPEARANCEUR Clear 01/21/2019 0833   LABSPEC 1.009 04/12/2024 1600   LABSPEC 1.005 08/05/2011 1910   PHURINE 5.0 04/12/2024 1600   GLUCOSEU NEGATIVE 04/12/2024 1600   GLUCOSEU Negative 08/05/2011 1910   HGBUR NEGATIVE 04/12/2024 1600   BILIRUBINUR NEGATIVE 04/12/2024 1600   BILIRUBINUR Negative 01/21/2019 0833   BILIRUBINUR Negative 08/05/2011 1910   KETONESUR NEGATIVE 04/12/2024 1600   PROTEINUR NEGATIVE 04/12/2024 1600   NITRITE NEGATIVE 04/12/2024 1600   LEUKOCYTESUR NEGATIVE 04/12/2024 1600   LEUKOCYTESUR Negative 08/05/2011 1910   Sepsis Labs: @LABRCNTIP (procalcitonin:4,lacticidven:4) ) Recent Results (from the past 240 hours)  Resp panel by RT-PCR (RSV, Flu A&B, Covid) Urine, Catheterized     Status: None   Collection Time: 04/12/24  4:00 PM   Specimen: Urine, Catheterized; Nasal Swab  Result Value Ref Range Status   SARS Coronavirus 2 by RT PCR NEGATIVE NEGATIVE Final    Comment:  (NOTE) SARS-CoV-2 target nucleic acids are NOT DETECTED.  The SARS-CoV-2 RNA is generally detectable in upper respiratory specimens during the acute phase of infection. The lowest concentration of SARS-CoV-2 viral copies this assay can detect is 138 copies/mL. A negative result does not preclude SARS-Cov-2 infection and should not be used as the sole basis for treatment or other patient management decisions. A negative result may occur with  improper specimen collection/handling, submission of specimen other than nasopharyngeal swab, presence of viral mutation(s) within the areas  targeted by this assay, and inadequate number of viral copies(<138 copies/mL). A negative result must be combined with clinical observations, patient history, and epidemiological information. The expected result is Negative.  Fact Sheet for Patients:  bloggercourse.com  Fact Sheet for Healthcare Providers:  seriousbroker.it  This test is no t yet approved or cleared by the [place]  FDA and  has been authorized for detection and/or diagnosis of SARS-CoV-2 by FDA under an Emergency Use Authorization (EUA). This EUA will remain  in effect (meaning this test can be used) for the duration of the COVID-19 declaration under Section 564(b)(1) of the Act, 21 U.S.C.section 360bbb-3(b)(1), unless the authorization is terminated  or revoked sooner.       Influenza A by PCR NEGATIVE NEGATIVE Final   Influenza B by PCR NEGATIVE NEGATIVE Final    Comment: (NOTE) The Xpert Xpress SARS-CoV-2/FLU/RSV plus assay is intended as an aid in the diagnosis of influenza from Nasopharyngeal swab specimens and should not be used as a sole basis for treatment. Nasal washings and aspirates are unacceptable for Xpert Xpress SARS-CoV-2/FLU/RSV testing.  Fact Sheet for Patients: bloggercourse.com  Fact Sheet for Healthcare  Providers: seriousbroker.it  This test is not yet approved or cleared by the [place]  FDA and has been authorized for detection and/or diagnosis of SARS-CoV-2 by FDA under an Emergency Use Authorization (EUA). This EUA will remain in effect (meaning this test can be used) for the duration of the COVID-19 declaration under Section 564(b)(1) of the Act, 21 U.S.C. section 360bbb-3(b)(1), unless the authorization is terminated or revoked.     Resp Syncytial Virus by PCR NEGATIVE NEGATIVE Final    Comment: (NOTE) Fact Sheet for Patients: bloggercourse.com  Fact Sheet for Healthcare Providers: seriousbroker.it  This test is not yet approved or cleared by the [place]  FDA and has been authorized for detection and/or diagnosis of SARS-CoV-2 by FDA under an Emergency Use Authorization (EUA). This EUA will remain in effect (meaning this test can be used) for the duration of the COVID-19 declaration under Section 564(b)(1) of the Act, 21 U.S.C. section 360bbb-3(b)(1), unless the authorization is terminated or revoked.  Performed at [facility], [address] [place], [place] [address]      Radiological Exams on Admission:   Assessment/Plan Principal Problem:   Stroke [facility]) Active Problems:   Myocardial injury   Cough   Atrial fibrillation with RVR (HCC)   Essential hypertension   Chronic diastolic CHF (congestive heart failure) (HCC)   Acquired hypothyroidism   HLD (hyperlipidemia)   Chronic kidney disease, stage 3a (HCC)   Focal seizure (HCC)   Dementia (HCC)   Assessment and Plan:  Stroke [facility]): MRI showed acute linear 7 mm infarct in the left thalamus, remote right MCA territory infarct with ex vacuo dilation of the right lateral ventricle. CTA negative for LVO.  -Placed on tele bed for observation - will hold oral Bp meds to allow permissive HTN  - will temporarily hold  Eliquis  to avoid hemorrhagic conversion - Start aspirin  325 mg daily - Statin: Continue home Lipitor  40 mg daily - fasting lipid panel and HbA1c  - 2D transthoracic echocardiography  - swallowing screen. If fails, will get SLP - PT/OT consult  Myocardial injury: trop 29 --> 29.  Likely demand ischemia. - On aspirin  Lipitor   Cough: On my examination no wheezing.  No oxygen desaturation.  Chest x-ray negative for infiltration.  PCR negative for COVID, flu and RSV. - Bronchodilators and as needed Mucinex   Atrial  fibrillation with RVR (HCC): Heart rate 110-120s - Continue metoprolol  25 mg daily (patient received extra dose of metoprolol  in ED) - IV metoprolol  2.5 mg every 2 hour for heart rate> 125 - Hold Eliquis  as above  Essential hypertension: - prn IV hydralazine  for SBP>220 or dBP>120 - Will not hold metoprolol  25 mg daily due to A fib with RVR  Chronic diastolic CHF (congestive heart failure) (HCC): 2D echo on 07/13/2022 showed EF of 55 to 60%.  Patient does not have leg edema, proBNP 799, CHF seems to be compensated.  He is not taking diuretics. - Watch volume status closely.  Acquired hypothyroidism -Synthroid   HLD (hyperlipidemia) -Lipitor   Chronic kidney disease, stage 3a (HCC): Stable.  GFR> 60. -Follow-up with BMP  Focal seizure [facility]): Patient is taking as needed Ativan  at home -Seizure precaution - As needed Ativan  for seizure  Dementia [facility]): No behavior disturbance.  - Fall precaution      DVT ppx: SQ Heparin      Code Status: DNR per his daughter  Family Communication:     not done, no family member is at bed side.     Disposition Plan:  Anticipate discharge back to previous environment  Consults called:  none  Admission status and Level of care: Telemetry:    for obs   Dispo: The patient is from: Home              Anticipated d/c is to: Home              Anticipated d/c date is: 1 day              Patient currently is not medically stable to  d/c.    Severity of Illness:  The appropriate patient status for this patient is OBSERVATION. Observation status is judged to be reasonable and necessary in order to provide the required intensity of service to ensure the patient's safety. The patient's presenting symptoms, physical exam findings, and initial radiographic and laboratory data in the context of their medical condition is felt to place them at decreased risk for further clinical deterioration. Furthermore, it is anticipated that the patient will be medically stable for discharge from the hospital within 2 midnights of admission.       Date of Service 04/12/2024    Caleb Exon [facility]   If 7PM-7AM, please contact night-coverage [email or web address] 04/12/2024, 10:24 PM

## 2024-04-12 NOTE — Discharge Instructions (Addendum)
 The respiratory testing was negative for COVID and influenza and the chest x-ray did not show any evidence of pneumonia.  However, I do suspect that you have a viral respiratory infection.  Use the albuterol  inhaler, with spacer, and take 1 to 2 puffs every 4-6 hours as needed for any shortness of breath or wheezing.  Use the Tessalon Perles every 8 hours during the day as needed for cough.  Take them with a small sip of water.  They may give you numbness to the base of your tongue, or a metallic taste in your mouth, this is normal.  Use the Promethazine DM cough syrup at bedtime as needed for cough and congestion.  If you develop any new or worsening symptoms on the return for reevaluation or follow-up with your primary care provider.

## 2024-04-12 NOTE — ED Triage Notes (Signed)
 First nurse note: Pt to ED via [facility] from home. EMS called for stroke like symptoms this afternoon and abnormal speech while pt at [facility]. EMS was also there earlier this morning for weakness and pt went to UC. Hx of CVA. Pt's husband reports 8:30pm pt went to bed last pm and speech was normal.  EMS reports unable to establish accurate timeline but pt's speech is slurred.   155/96 Afib  81 CBG  20g LAC

## 2024-04-12 NOTE — ED Triage Notes (Signed)
 Patient's daughter states that her mother has felt fatigued and coughing this morning.  Daughter states that EMT came this morning and checked her and states that she might have pneumonia in her right lower lung.  Daughter unsure of fevers.  Daughter states that her mother has had cough for 3 days.

## 2024-04-12 NOTE — ED Triage Notes (Addendum)
 Pt went to bed at 1900 yesterday and family reports pt was her normal self. Upon waking at 0800 today, husband called EMS due to cough and generalized weakness. Husband said he noticed some delay in her responses. EMS did not transport and family took pt to urgent care. Pt had CXR done and was advised to come get head CT for the delayed responses. Pt able to answer all of my questions in triage with clear speech. No facial asymmetry. Daughter says she sounds normal at this time. Pt denies any pain and says she feels normal. Pt has hx of CVA 02/2021 and was evaluated for seizures and started on keppra  05/2021. In April 2025, Keppra  discontinued and pt was prescribed ativan  PRN for episodes where she has problems with her speech (daughter reports she mumbles).

## 2024-04-12 NOTE — ED Provider Notes (Signed)
 Courtney Chan Provider Note    Event Date/Time   First MD Initiated Contact with Patient 04/12/24 1538     (approximate)   History   No chief complaint on file.   HPI  Courtney Chan is a 89 y.o. female history of dementia, A-fib on Eliquis , CKD, prior history of CVA, here for weakness and intermittent garbled speech.  Patient denies any chest pain or shortness of breath, denies any urinary symptoms, no new falls or trauma.  She denies any pain anywhere at this time.  Denies any new weakness or numbness or headache.  Per independent history from daughter, patient last known well was at 7 PM last night when she went to bed.  States that her dad told her this morning that she seemed a little slower with walking, her speech was slurred.  Slurred speech was intermittent.  They called EMS who thought that they could hear crackles on exam, went to urgent care and had a chest x-ray done that did not show any pneumonia.  Daughter did note a cough.  States that she takes Ativan  as needed for seizure, no longer on Keppra .  States that patient has a intermittent garbled speech multiple times in the past, had multiple stroke workups that were negative, neurology thought this might be related to focal seizures.  Is also unsure if she took her metoprolol  today.    On independent chart review, she was seen by primary care in September, has history of chronic back pain, on low-dose Tylenol , does have history of tachycardia, at the clinic heart rate was 114.  She was seen by neurology in April, history of CVA, had her Keppra  discontinued during last visit, had 2 episodes of muscle speech that lasted about 5 minutes and resolved following Ativan .  Neurology had discussed with them about resuming antiseizure medications but after shared decision making, they will hold off.  Will do Ativan  as needed and to keep a symptom journal.   Physical Exam   Triage Vital Signs: ED Triage Vitals   Encounter Vitals Group     BP 04/12/24 1400 130/86     Girls Systolic BP Percentile --      Girls Diastolic BP Percentile --      Boys Systolic BP Percentile --      Boys Diastolic BP Percentile --      Pulse Rate 04/12/24 1400 86     Resp 04/12/24 1400 16     Temp 04/12/24 1400 97.8 F (36.6 C)     Temp Source 04/12/24 1400 Oral     SpO2 04/12/24 1400 95 %     Weight 04/12/24 1339 145 lb 1 oz (65.8 kg)     Height 04/12/24 1339 5' 4.5 (1.638 m)     Head Circumference --      Peak Flow --      Pain Score 04/12/24 1339 0     Pain Loc --      Pain Education --      Exclude from Growth Chart --     Most recent vital signs: Vitals:   04/12/24 1707 04/12/24 1840  BP: 125/76   Pulse: (!) 117   Resp:    Temp:  97.8 F (36.6 C)  SpO2:       General: Awake, no distress.  CV:  Good peripheral perfusion.  Resp:  Normal effort.  No tachypnea or respiratory distress, did hear trace crackles in the bases Abd:  No  distention.  Soft nontender Other:  Pupils are equal, extraocular movements are intact, no cranial nerve deficits, no focal weakness or numbness, she is A and O x 3, had 1 episode where she tried to say the year and that was garbled but progressive his speech is fluent, no slurred speech.  Trace lower extremity edema noted.  No unilateral Or Tenderness   ED Results / Procedures / Treatments   Labs (all labs ordered are listed, but only abnormal results are displayed) Labs Reviewed  CBC WITH DIFFERENTIAL/PLATELET - Abnormal; Notable for the following components:      Result Value   RDW 15.9 (*)    All other components within normal limits  COMPREHENSIVE METABOLIC PANEL WITH GFR - Abnormal; Notable for the following components:   Alkaline Phosphatase 147 (*)    All other components within normal limits  PROTIME-INR - Abnormal; Notable for the following components:   Prothrombin Time 17.7 (*)    INR 1.4 (*)    All other components within normal limits  URINALYSIS,  ROUTINE W REFLEX MICROSCOPIC - Abnormal; Notable for the following components:   Color, Urine YELLOW (*)    APPearance CLEAR (*)    All other components within normal limits  PRO BRAIN NATRIURETIC PEPTIDE - Abnormal; Notable for the following components:   Pro Brain Natriuretic Peptide 799.0 (*)    All other components within normal limits  TROPONIN T, HIGH SENSITIVITY - Abnormal; Notable for the following components:   Troponin T High Sensitivity 29 (*)    All other components within normal limits  TROPONIN T, HIGH SENSITIVITY - Abnormal; Notable for the following components:   Troponin T High Sensitivity 29 (*)    All other components within normal limits  RESP PANEL BY RT-PCR (RSV, FLU A&B, COVID)  RVPGX2  APTT  ETHANOL     EKG  EKG shows, atrial fibrillation with RVR, rate 110, normal QS, normal QTc, no obvious ischemic ST elevation, T wave flattening to V3, not significant change compared to prior   RADIOLOGY On my independent interpretation, CT head without obvious intracranial hemorrhage   PROCEDURES:  Critical Care performed: No  Procedures   MEDICATIONS ORDERED IN ED: Medications  ondansetron  (ZOFRAN ) injection 4 mg (has no administration in time range)  hydrALAZINE  (APRESOLINE ) injection 5 mg (has no administration in time range)  albuterol  (VENTOLIN  HFA) 108 (90 Base) MCG/ACT inhaler 2 puff (has no administration in time range)  dextromethorphan -guaiFENesin  (MUCINEX  DM) 30-600 MG per 12 hr tablet 1 tablet (has no administration in time range)  metoprolol  succinate (TOPROL -XL) 24 hr tablet 25 mg (25 mg Oral Given 04/12/24 1707)     IMPRESSION / MDM / ASSESSMENT AND PLAN / ED COURSE  I reviewed the triage vital signs and the nursing notes.                              Differential diagnosis includes, but is not limited to, viral illness, chest x-ray done at urgent care without obvious consolidation or pneumonia, atypical ACS, arrhythmia, did also consider  CHF but patient has no shortness of breath or hypoxia at this time.  Did consider focal seizure, CVA, stroke alert was activated given her last known well was at 7 PM.  Did also consider UTI, electrolyte derangements.  Patient was noted to be A-fib with RVR in the room, will give her home dose of metoprolol  here since it seems like she has missed  her morning dose.  Labs, EKG, BNP, troponin, UA, CT head, MRI.  Reassess.  Patient's presentation is most consistent with acute presentation with potential threat to life or bodily function.  Independent interpretation of labs and imaging below.  Gave patient her home dose of metoprolol , heart rate is in the 100s to 110s.  Troponin is stable x 2.  She denies any shortness of breath or chest pain at this time.  MRI shows acute stroke.  Patient has had mental baseline at this time, discussed with patient and family about imaging results.  Will plan to have her admitted for further management of acute stroke.  Consult to hospitalist will admit the patient.  She is admitted.  The patient is on the cardiac monitor to evaluate for evidence of arrhythmia and/or significant heart rate changes.   Clinical Course as of 04/12/24 RETHA Repress Apr 12, 2024  1539 CT Head Wo Contrast IMPRESSION: No acute CT finding. Old infarction in the right insula and frontal operculum. Chronic small-vessel ischemic changes of the hemispheric white matter, moderate to advanced.   [TT]  8161 Received call from radiology, patient has acute thalamic stroke on MRI. [TT]  1839 MR BRAIN WO CONTRAST IMPRESSION: 1. Acute linear 7 mm infarct in the left thalamus. 2. Remote right MCA territory infarct with ex vacuo dilation of the right lateral ventricle. 3. Stable atrophy and white matter disease.   [TT]    Clinical Course User Index [TT] Waymond Lorelle Cummins, MD     FINAL CLINICAL IMPRESSION(S) / ED DIAGNOSES   Final diagnoses:  Cerebrovascular accident (CVA), unspecified mechanism  (HCC)  Slurred speech     Rx / DC Orders   ED Discharge Orders          Ordered    amoxicillin-clavulanate (AUGMENTIN) 875-125 MG tablet  2 times daily,   Status:  Discontinued        04/12/24 1815             Note:  This document was prepared using Dragon voice recognition software and may include unintentional dictation errors.    Waymond Lorelle Cummins, MD 04/12/24 RETHA

## 2024-04-12 NOTE — Discharge Instructions (Addendum)
 Please take the antibiotics as

## 2024-04-12 NOTE — ED Provider Notes (Signed)
 [facility]    CSN: [id number] Arrival date & time: 04/12/24  1031      History   Chief Complaint Chief Complaint  Patient presents with   Cough    HPI Courtney Chan is a 89 y.o. female.   HPI  89 year old female with past medical history significant for atrial fibrillation, hyperlipidemia, hypertension, CVA, and thyroid  disease presents for evaluation of 2 days worth of cough and fatigue.  No associated fever or URI symptoms.  Patient has had some shortness of breath and wheezing.  Patient denies chest pain.  Patient was evaluated by EMS at home this morning advised that she may have pneumonia and her right lower lung so her daughter brought her in for evaluation.  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   Debility 07/04/2019   CHF (congestive heart failure) (HCC)    Atrial fibrillation (HCC)    Acute cerebral infarction (HCC)    Non-ST elevation (NSTEMI) myocardial infarction (HCC)    Non-ST elevation MI (NSTEMI) (HCC) 06/28/2019   Cardiac arrest due to underlying cardiac condition (HCC) 06/28/2019   Cardiac arrest (HCC) 06/28/2019   Ventilator dependent (HCC)    Coronary artery disease involving native coronary artery of native heart without angina pectoris 03/30/2019   PAD (peripheral artery disease) 01/11/2019   Acute cystitis without hematuria 12/31/2018   Diarrhea 12/31/2018   CKD (chronic kidney disease) stage 3, GFR 30-59 ml/min (HCC) 05/27/2018   History of TIA (transient ischemic attack) 05/27/2018   Persistent atrial  fibrillation (HCC) 08/25/2017   Osteopenia of multiple sites 11/09/2016   DNR (do not resuscitate) 10/31/2016   Encounter for general adult medical examination without abnormal findings 05/02/2016   Vaccine counseling 11/01/2015   Borderline diabetes mellitus 11/03/2013   Pure hypercholesterolemia 11/03/2013   Atrial fibrillation with RVR (HCC) 09/14/2013   Acquired hypothyroidism 09/14/2013   Diverticulosis 09/14/2013   Essential hypertension 09/14/2013   GERD without esophagitis 09/14/2013   Mitral valve prolapse 09/14/2013   H/O cardiac catheterization 08/12/2008    Past Surgical History:  Procedure Laterality Date   ABDOMINAL HYSTERECTOMY     BREAST BIOPSY Left 1980   Negative   CORONARY/GRAFT ACUTE MI REVASCULARIZATION N/A 06/28/2019   Procedure: Coronary/Graft Acute MI Revascularization;  Surgeon: Anner Alm ORN, MD;  Location: [facility] INVASIVE CV LAB;  Service: Cardiovascular;  Laterality: N/A;   LEFT HEART CATH AND CORONARY ANGIOGRAPHY N/A 06/28/2019   Procedure: LEFT HEART CATH AND CORONARY ANGIOGRAPHY;  Surgeon: Anner Alm ORN, MD;  Location: [facility] INVASIVE CV LAB;  Service: Cardiovascular;  Laterality: N/A;    OB History   No obstetric history on file.      Home Medications    Prior to Admission medications   Medication Sig Start Date End Date Taking? Authorizing Provider  albuterol  (VENTOLIN  HFA) 108 (90 Base) MCG/ACT inhaler Inhale 2 puffs into the lungs every 4 (four) hours as needed. 04/12/24  Yes Bernardino Ditch, NP  benzonatate (TESSALON) 100 MG capsule Take 2 capsules (200 mg  total) by mouth every 8 (eight) hours. 04/12/24  Yes Bernardino Ditch, NP  promethazine-dextromethorphan  (PROMETHAZINE-DM) 6.25-15 MG/5ML syrup Take 2.5 mLs by mouth 4 (four) times daily as needed. 04/12/24  Yes Bernardino Ditch, NP  Spacer/Aero-Holding Chambers (AEROCHAMBER MV) inhaler Use as instructed 04/12/24  Yes Bernardino Ditch, NP  atorvastatin  (LIPITOR ) 40 MG tablet Take 1 tablet (40 mg total)  by mouth daily at 6 PM. 06/05/22 09/03/22  Von Bellis, MD  digoxin  (DIGOX ) 0.125 MG tablet Take 1 tablet (0.125 mg total) by mouth daily. 07/13/22   Wouk, Devaughn Sayres, MD  ELIQUIS  5 MG TABS tablet Take 5 mg by mouth 2 (two) times daily.    [provider]  levETIRAcetam  (KEPPRA ) 250 MG tablet One tablet by mouth every morning and 2 tablets by mouth every evening 07/13/22   Wouk, Devaughn Sayres, MD  levothyroxine  (SYNTHROID ) 88 MCG tablet Take 1 tablet (88 mcg total) by mouth daily. 07/10/19   Angiulli, Toribio PARAS, PA-C  metoprolol  succinate (TOPROL -XL) 25 MG 24 hr tablet Take 25 mg by mouth daily. 06/01/22   [provider]  pantoprazole  (PROTONIX ) 40 MG tablet Take 1 tablet (40 mg total) by mouth daily. 04/04/20 04/04/21  Alain Deward FALCON, MD    Family History Family History  Problem Relation Age of Onset   Breast cancer Maternal Grandmother        81's    Social History Social History   Tobacco Use   Smoking status: Never   Smokeless tobacco: Never  Vaping Use   Vaping status: Never Used  Substance Use Topics   Alcohol use: No   Drug use: No     Allergies   Metronidazole and Oxycodone   Review of Systems Review of Systems  Constitutional:  Positive for fatigue. Negative for fever.  HENT:  Negative for congestion and rhinorrhea.   Respiratory:  Positive for cough, shortness of breath and wheezing.      Physical Exam Triage Vital Signs ED Triage Vitals  Encounter Vitals Group     BP      Girls Systolic BP Percentile      Girls Diastolic BP Percentile      Boys Systolic BP Percentile      Boys Diastolic BP Percentile      Pulse      Resp      Temp      Temp src      SpO2      Weight      Height      Head Circumference      Peak Flow      Pain Score      Pain Loc      Pain Education      Exclude from Growth Chart    No data found.  Updated Vital Signs BP 131/85 (BP Location: Right Arm)   Pulse 99   Temp 97.7 F (36.5 C) (Oral)   Resp 14    Wt 145 lb 1 oz (65.8 kg)   SpO2 93%   BMI 24.52 kg/m   Visual Acuity Right Eye Distance:   Left Eye Distance:   Bilateral Distance:    Right Eye Near:   Left Eye Near:    Bilateral Near:     Physical Exam Vitals and nursing note reviewed.  Constitutional:      Appearance: Normal appearance. She is not ill-appearing.  HENT:     Head: Normocephalic and atraumatic.  Cardiovascular:     Rate and Rhythm:  Normal rate. Rhythm irregular.     Pulses: Normal pulses.     Heart sounds: Normal heart sounds. No murmur heard.    No friction rub. No gallop.  Pulmonary:     Effort: Pulmonary effort is normal.     Breath sounds: No wheezing, rhonchi or rales.     Comments: Breath sounds decreased diffusely without any adventitious lung sounds appreciated. Skin:    General: Skin is warm and dry.  Neurological:     Mental Status: She is alert.      UC Treatments / Results  Labs (all labs ordered are listed, but only abnormal results are displayed) Labs Reviewed  POC COVID19/FLU A&B COMBO - Normal    EKG Atrial fibrillation with a ventricular rate of 98 bpm PR interval 18 ms QRS duration 86 ms QT/QTc 366/432 ms. Left axis deviation.  Possible inferior infarct of undetermined age and nonspecific ST septal abnormality noted.  Radiology DG Chest 2 View Result Date: 04/12/2024 CLINICAL DATA:  Cough for 3 days.  Hypoxia. EXAM: CHEST - 2 VIEW COMPARISON:  09/16/2023 FINDINGS: The heart size and mediastinal contours are within normal limits. Low lung volumes again noted. Both lungs are clear. IMPRESSION: Low lung volumes. No active cardiopulmonary disease. Electronically Signed   By: Norleen DELENA Kil M.D.   On: 04/12/2024 11:28    Procedures Procedures (including critical care time)  Medications Ordered in UC Medications - No data to display  Initial Impression / Assessment and Plan / UC Course  I have reviewed the triage vital signs and the nursing notes.  Pertinent labs &  imaging results that were available during my care of the patient were reviewed by me and considered in my medical decision making (see chart for details).   Patient is a nontoxic-appearing 89 year old female presenting for evaluation of fatigue with associated cough, shortness breath, and wheezing that been going on for last 2 days.  No fever.  The exam room she is not in any acute distress.  Her daughter is doing the talking for her.  She does not demonstrate any increased work of breathing.  Her respiratory rate at triage was 14 with a 93% room air oxygen saturation.  She was evaluated by EMS at home this morning and had an EKG performed which showed atrial fibrillation with a ventricular rate of 98 bpm as well as left axis deviation and nonspecific ST abnormalities.  No appreciable change when compared to EKG dated 09/16/2023.  I will order a respiratory antigen panel to assess for COVID or influenza given the patient's not having any upper respiratory symptoms and chest x-ray to evaluate for any acute cardiopulmonary pathology.  Respiratory antigen panel is negative for COVID or influenza.  Chest x-ray independent reviewed and evaluated by me.  Impression:.  Cardiomegaly and decreased lung volumes.  No acute infiltrate or effusion.  Radiology overread is pending. Radiology impression states low lung volumes but no active cardiopulmonary disease.  I will discharge patient with diagnosis of lower respiratory tract infection.  She has an albuterol  inhaler at home that she can use every 4-6 hours as needed for shortness breath or wheezing.  Also prescribed Tessalon  Perles and Promethazine  DM cough syrup for cough and congestion.  Final Clinical Impressions(s) / UC Diagnoses   Final diagnoses:  Acute cough  Lower respiratory tract infection     Discharge Instructions      The respiratory testing was negative for COVID and influenza and the chest x-ray did not show any  evidence of pneumonia.   However, I do suspect that you have a viral respiratory infection.  Use the albuterol  inhaler, with spacer, and take 1 to 2 puffs every 4-6 hours as needed for any shortness of breath or wheezing.  Use the Tessalon  Perles every 8 hours during the day as needed for cough.  Take them with a small sip of water.  They may give you numbness to the base of your tongue, or a metallic taste in your mouth, this is normal.  Use the Promethazine  DM cough syrup at bedtime as needed for cough and congestion.  If you develop any new or worsening symptoms on the return for reevaluation or follow-up with your primary care provider.     ED Prescriptions     Medication Sig Dispense Auth. Provider   albuterol  (VENTOLIN  HFA) 108 (90 Base) MCG/ACT inhaler Inhale 2 puffs into the lungs every 4 (four) hours as needed. 18 g Bernardino Ditch, NP   Spacer/Aero-Holding Chambers (AEROCHAMBER MV) inhaler Use as instructed 1 each Bernardino Ditch, NP   benzonatate  (TESSALON ) 100 MG capsule Take 2 capsules (200 mg total) by mouth every 8 (eight) hours. 21 capsule Bernardino Ditch, NP   promethazine -dextromethorphan  (PROMETHAZINE -DM) 6.25-15 MG/5ML syrup Take 2.5 mLs by mouth 4 (four) times daily as needed. 118 mL Bernardino Ditch, NP      PDMP not reviewed this encounter.   Bernardino Ditch, NP 04/12/24 1204

## 2024-04-13 ENCOUNTER — Observation Stay: Admit: 2024-04-13 | Discharge: 2024-04-13 | Disposition: A | Attending: Internal Medicine | Admitting: Internal Medicine

## 2024-04-13 DIAGNOSIS — I639 Cerebral infarction, unspecified: Secondary | ICD-10-CM | POA: Diagnosis not present

## 2024-04-13 LAB — RESPIRATORY PANEL BY PCR

## 2024-04-13 LAB — HEMOGLOBIN A1C
Hgb A1c MFr Bld: 5.6 % (ref 4.8–5.6)
Mean Plasma Glucose: 114 mg/dL

## 2024-04-13 LAB — LIPID PANEL
Cholesterol: 141 mg/dL (ref 0–200)
HDL: 37 mg/dL — ABNORMAL LOW (ref >40)
LDL Cholesterol: 87 mg/dL (ref 0–99)
Total CHOL/HDL Ratio: 3.8 ratio
Triglycerides: 85 mg/dL (ref <150)
VLDL: 17 mg/dL (ref 0–40)

## 2024-04-13 MED ORDER — ATORVASTATIN CALCIUM 20 MG PO TABS: 80.0000 mg | ORAL_TABLET | Freq: Every day | ORAL | Status: DC

## 2024-04-13 MED ORDER — APIXABAN 2.5 MG PO TABS
2.5000 mg | ORAL_TABLET | Freq: Two times a day (BID) | ORAL | Status: DC
  Administered 2024-04-13 – 2024-04-14 (×2): 2.5 mg via ORAL
  Filled 2024-04-13 (×2): qty 1

## 2024-04-13 MED ORDER — METOPROLOL SUCCINATE ER 25 MG PO TB24
25.0000 mg | ORAL_TABLET | Freq: Every day | ORAL | Status: DC
  Administered 2024-04-14: 25 mg via ORAL
  Filled 2024-04-13: qty 1

## 2024-04-13 MED ORDER — METOPROLOL SUCCINATE ER 25 MG PO TB24
12.5000 mg | ORAL_TABLET | Freq: Once | ORAL | Status: DC
  Filled 2024-04-13: qty 1

## 2024-04-13 MED ORDER — ATORVASTATIN CALCIUM 20 MG PO TABS
80.0000 mg | ORAL_TABLET | Freq: Every day | ORAL | Status: DC
  Administered 2024-04-13: 80 mg via ORAL
  Filled 2024-04-13: qty 4

## 2024-04-13 MED ORDER — METOPROLOL SUCCINATE ER 25 MG PO TB24: 37.5000 mg | ORAL_TABLET | Freq: Every day | ORAL | Status: DC

## 2024-04-13 MED ORDER — ATORVASTATIN CALCIUM 20 MG PO TABS: 20.0000 mg | ORAL_TABLET | Freq: Every day | ORAL | Status: DC

## 2024-04-13 NOTE — Plan of Care (Signed)

## 2024-04-13 NOTE — Consult Note (Signed)
 NEUROLOGY CONSULT NOTE   Date of service: April 13, 2024 Patient Name: Courtney Chan MRN:  [id number] DOB:  Jun 19, 1930 Chief Complaint: acute stroke Requesting Provider: Jerelene Critchley, MD  History of Present Illness  Courtney Chan is a 89 y.o. female with hx of remote R MCA stroke without residual deficits per patient, HTN, HL, PVD, dCHF, hypothyroidism, GERD, dementia, CKD3a, a fib on eliquis , hx cardiac arrest who presented to ED for garbled speech. LKW 1900 day prior to admission and she was at baseline. When she woke up family noted that she had slow and dysarthric speech. Daughter told ED that she has a history of occasional episodes of garbled speech for with she is followed by Sherran Milon Berliner at [facility]. According to her note dated 08/26/23, Discussed at length with patient's daughter and patient the option to resume antiseizure medications, however suspect this would be low yield given age and risk of side effects. Shared decision was to hold off on making medication changes at this time and instead continue as needed Ativan  and keep a symptom journal.   In ED patient underwent MRI and was found to have an acute subcentimeter infarct in the L thalamus as well as remote R MCA infarct. Patient was admitted for stroke workup. Her speech has returned to baseline.  LKW: 1800 on 11/29 Modified rankin score: 3-Moderate disability-requires help but walks WITHOUT assistance IV Thrombolysis: no outside window and non-disabling sx  NIHSS components Score: Comment  1a Level of Conscious 0[x]  1[]  2[]  3[]      1b LOC Questions 0[]  1[]  2[x]       1c LOC Commands 0[]  1[x]  2[]       2 Best Gaze 0[x]  1[]  2[]       3 Visual 0[x]  1[]  2[]  3[]      4 Facial Palsy 0[]  1[x]  2[]  3[]      5a Motor Arm - left 0[]  1[x]  2[]  3[]  4[]  UN[]    5b Motor Arm - Right 0[]  1[x]  2[]  3[]  4[]  UN[]    6a Motor Leg - Left 0[]  1[x]  2[]  3[]  4[]  UN[]    6b Motor Leg - Right 0[]  1[x]  2[]  3[]  4[]  UN[]    7  Limb Ataxia 0[x]  1[]  2[]  UN[]      8 Sensory 0[x]  1[]  2[]  UN[]      9 Best Language 0[]  1[x]  2[]  3[]      10 Dysarthria 0[]  1[x]  2[]  UN[]      11 Extinct. and Inattention 0[x]  1[]  2[]       TOTAL:  10*    *Husband reports she is back to baseline and that these are all chronic findings   ROS  Unable to ascertain due to dementia  Past History   Past Medical History:  Diagnosis Date   Atrial fibrillation (HCC) 2011   Hyperlipidemia    Hypertension    Stroke (HCC)    Thyroid  disease     Past Surgical History:  Procedure Laterality Date   ABDOMINAL HYSTERECTOMY     BREAST BIOPSY Left 1980   Negative   CORONARY/GRAFT ACUTE MI REVASCULARIZATION N/A 06/28/2019   Procedure: Coronary/Graft Acute MI Revascularization;  Surgeon: Anner Alm ORN, MD;  Location: [facility] INVASIVE CV LAB;  Service: Cardiovascular;  Laterality: N/A;   LEFT HEART CATH AND CORONARY ANGIOGRAPHY N/A 06/28/2019   Procedure: LEFT HEART CATH AND CORONARY ANGIOGRAPHY;  Surgeon: Anner Alm ORN, MD;  Location: [facility] INVASIVE CV LAB;  Service: Cardiovascular;  Laterality: N/A;    Family History: Family History  Problem Relation Age of  Onset   Breast cancer Maternal Grandmother        16's    Social History  reports that she has never smoked. She has never used smokeless tobacco. She reports that she does not drink alcohol and does not use drugs.  Allergies  Allergen Reactions   Metronidazole Diarrhea and Nausea And Vomiting   Oxycodone Other (See Comments)    Altered mental status    Medications   Current Facility-Administered Medications:    acetaminophen  (TYLENOL ) tablet 650 mg, 650 mg, Oral, Q4H PRN **OR** acetaminophen  (TYLENOL ) 160 MG/5ML solution 650 mg, 650 mg, Per Tube, Q4H PRN **OR** acetaminophen  (TYLENOL ) suppository 650 mg, 650 mg, Rectal, Q4H PRN, Niu, Xilin, MD   albuterol  (PROVENTIL ) (2.5 MG/3ML) 0.083% nebulizer solution 2.5 mg, 2.5 mg, Inhalation, Q4H PRN, Niu, Xilin, MD   apixaban  (ELIQUIS )  tablet 2.5 mg, 2.5 mg, Oral, BID, Matthews Elida HERO, MD   atorvastatin  (LIPITOR ) tablet 80 mg, 80 mg, Oral, q1800, Ponnala, Shruthi, MD   dextromethorphan -guaiFENesin  (MUCINEX  DM) 30-600 MG per 12 hr tablet 1 tablet, 1 tablet, Oral, BID PRN, Niu, Xilin, MD   hydrALAZINE  (APRESOLINE ) injection 5 mg, 5 mg, Intravenous, Q2H PRN, Niu, Xilin, MD   levothyroxine  (SYNTHROID ) tablet 88 mcg, 88 mcg, Oral, Daily, Niu, Xilin, MD, 88 mcg at 04/13/24 9386   LORazepam  (ATIVAN ) injection 2 mg, 2 mg, Intravenous, Q2H PRN, Niu, Xilin, MD   metoprolol  succinate (TOPROL -XL) 24 hr tablet 12.5 mg, 12.5 mg, Oral, Once, Ponnala, Shruthi, MD   [START ON 04/14/2024] metoprolol  succinate (TOPROL -XL) 24 hr tablet 37.5 mg, 37.5 mg, Oral, Daily, Ponnala, Shruthi, MD   metoprolol  tartrate (LOPRESSOR ) injection 2.5 mg, 2.5 mg, Intravenous, Q2H PRN, Niu, Xilin, MD, 2.5 mg at 04/12/24 1934   ondansetron  (ZOFRAN ) injection 4 mg, 4 mg, Intravenous, Q8H PRN, Niu, Xilin, MD   senna-docusate (Senokot-S) tablet 1 tablet, 1 tablet, Oral, QHS PRN, Hilma Rankins, MD  Vitals   Vitals:   04/13/24 0443 04/13/24 0722 04/13/24 0746 04/13/24 1146  BP: 139/88  (!) 129/93 (!) 96/52  Pulse: 92  99 84  Resp:   14 14  Temp: 97.6 F (36.4 C)  97.7 F (36.5 C) 97.7 F (36.5 C)  TempSrc: Axillary  Oral Oral  SpO2: 96% 95% 93% 96%  Weight:      Height:        Body mass index is 21.87 kg/m.   Physical Exam   Gen: patient OOB in chair CV: extremities appear well-perfused Resp: normal WOB  Neurologic exam MS: alert, oriented to self, husband, and hospital but not to age, year, or month Speech: mild dysarthria, mild anomia CN: PERRL, VFF, EOMI, sensation intact, L NLF flattening, hearing moderately impaired to voice Motor: 4+/5 strength throughout with drift in each extremity but not to bed Sensory: SILT Coordination: FNF without frank ataxia bilat Gait: deferred  Labs/Imaging/Neurodiagnostic studies   CBC:  Recent Labs  Lab  04/12/24 1343  WBC 8.3  NEUTROABS 5.3  HGB 13.5  HCT 42.2  MCV 95.7  PLT 339   Basic Metabolic Panel:  Lab Results  Component Value Date   NA 139 04/12/2024   K 4.5 04/12/2024   CO2 27 04/12/2024   GLUCOSE 97 04/12/2024   BUN 17 04/12/2024   CREATININE 0.82 04/12/2024   CALCIUM  9.7 04/12/2024   GFRNONAA >60 04/12/2024   GFRAA 53 (L) 07/11/2019   Lipid Panel:  Lab Results  Component Value Date   LDLCALC 87 04/13/2024   HgbA1c:  Lab  Results  Component Value Date   HGBA1C 5.8 (H) 06/04/2022   Urine Drug Screen: No results found for: LABOPIA, COCAINSCRNUR, LABBENZ, AMPHETMU, THCU, LABBARB  Alcohol Level     Component Value Date/Time   [facility] <15 04/12/2024 1343   INR  Lab Results  Component Value Date   INR 1.4 (H) 04/12/2024   APTT  Lab Results  Component Value Date   APTT 36 04/12/2024   AED levels: No results found for: PHENYTOIN, ZONISAMIDE, LAMOTRIGINE, LEVETIRACETA  CT Head without contrast(Personally reviewed): No acute CT finding. Old infarction in the right insula and frontal operculum. Chronic small-vessel ischemic changes of the hemispheric white matter, moderate to advanced.  CT angio Head and Neck with contrast(Personally reviewed): 1. Negative CTA for large vessel occlusion or other emergent finding. 2. Atheromatous irregularity about the PCAs with associated mild right and mild-to-moderate left P2 stenoses. 3. Additional mild atheromatous change about the carotid bifurcations and carotid siphons without hemodynamically significant stenosis.  MRI Brain(Personally reviewed): 1. Acute linear 7 mm infarct in the left thalamus. 2. Remote right MCA territory infarct with ex vacuo dilation of the right lateral ventricle. 3. Stable atrophy and white matter disease.  TTE - pending  Stroke Labs     Component Value Date/Time   CHOL 141 04/13/2024 0628   TRIG 85 04/13/2024 0628   HDL 37 (L) 04/13/2024 0628   CHOLHDL 3.8  04/13/2024 0628   VLDL 17 04/13/2024 0628   LDLCALC 87 04/13/2024 0628    Lab Results  Component Value Date/Time   HGBA1C 5.8 (H) 06/04/2022 11:49 AM     ASSESSMENT   Courtney Chan is a 89 y.o. female with hx of remote R MCA stroke without residual deficits per patient, HTN, HL, PVD, dCHF, hypothyroidism, GERD, dementia, CKD3a, a fib on eliquis , hx cardiac arrest who presented to ED for acute on chronic dysarthria and was found to have small L thalamic stroke on MRI. She is now back to her recent baseline. Stroke workup completed with the exception of TTE which is still pending.  RECOMMENDATIONS   - F/u TTE. When this results if there is no intracardiac clot or other significant abnormality she will be cleared by neurology for discharge if she has no other indication for continued admission. - No indication for permissive HTN 48 hrs from sx onset. Goal normotension, avoid hypotension - Restart eliquis , acute infarct is subcentimeter and low risk of hemorrhagic conversion. It appears she was on eliquis  5mg  bid prior to admission but meets criteria for reduced dose 2.5mg  bid 2/2 age, weight. No neurologic indication for antiplatelet in addition to therapeutic anticoagulation for secondary stroke prevention. - D/c aspirin  and pharmacologic DVT prophylaxis - Increase atorvastatin  to 80mg  daily for goal LDL <70 - PT, OT signed off with no recommendations for further therapy - SLP recommended HH ST - Stroke education - She may f/u with her established neurology provider at [facility] after hospital discharge ______________________________________________________________________    Signed, Elida CHRISTELLA Ross, MD [facility]

## 2024-04-13 NOTE — Plan of Care (Signed)
 Problem: Education: Goal: Knowledge of disease or condition will improve 04/13/2024 1709 by Courtney Chan PARAS, RN Outcome: Progressing 04/13/2024 1708 by Courtney Chan PARAS, RN Outcome: Progressing Goal: Knowledge of secondary prevention will improve (MUST DOCUMENT ALL) 04/13/2024 1709 by Courtney Chan PARAS, RN Outcome: Progressing 04/13/2024 1708 by Courtney Chan PARAS, RN Outcome: Progressing Goal: Knowledge of patient specific risk factors will improve (DELETE if not current risk factor) 04/13/2024 1709 by Courtney Chan PARAS, RN Outcome: Progressing 04/13/2024 1708 by Courtney Chan PARAS, RN Outcome: Progressing   Problem: Ischemic Stroke/TIA Tissue Perfusion: Goal: Complications of ischemic stroke/TIA will be minimized 04/13/2024 1709 by Courtney Chan PARAS, RN Outcome: Progressing 04/13/2024 1708 by Courtney Chan PARAS, RN Outcome: Progressing   Problem: Coping: Goal: Will verbalize positive feelings about self 04/13/2024 1709 by Courtney Chan PARAS, RN Outcome: Progressing 04/13/2024 1708 by Courtney Chan PARAS, RN Outcome: Progressing Goal: Will identify appropriate support needs 04/13/2024 1709 by Courtney Chan PARAS, RN Outcome: Progressing 04/13/2024 1708 by Courtney Chan PARAS, RN Outcome: Progressing   Problem: Health Behavior/Discharge Planning: Goal: Ability to manage health-related needs will improve 04/13/2024 1709 by Courtney Chan PARAS, RN Outcome: Progressing 04/13/2024 1708 by Courtney Chan PARAS, RN Outcome: Progressing Goal: Goals will be collaboratively established with patient/family 04/13/2024 1709 by Courtney Chan PARAS, RN Outcome: Progressing 04/13/2024 1708 by Courtney Chan PARAS, RN Outcome: Progressing   Problem: Self-Care: Goal: Ability to participate in self-care as condition permits will improve 04/13/2024 1709 by Courtney Chan PARAS, RN Outcome: Progressing 04/13/2024 1708 by Courtney Chan PARAS, RN Outcome: Progressing Goal: Verbalization of feelings and  concerns over difficulty with self-care will improve 04/13/2024 1709 by Courtney Chan PARAS, RN Outcome: Progressing 04/13/2024 1708 by Courtney Chan PARAS, RN Outcome: Progressing Goal: Ability to communicate needs accurately will improve 04/13/2024 1709 by Courtney Chan PARAS, RN Outcome: Progressing 04/13/2024 1708 by Courtney Chan PARAS, RN Outcome: Progressing   Problem: Nutrition: Goal: Risk of aspiration will decrease 04/13/2024 1709 by Courtney Chan PARAS, RN Outcome: Progressing 04/13/2024 1708 by Courtney Chan PARAS, RN Outcome: Progressing Goal: Dietary intake will improve 04/13/2024 1709 by Courtney Chan PARAS, RN Outcome: Progressing 04/13/2024 1708 by Courtney Chan PARAS, RN Outcome: Progressing   Problem: Education: Goal: Knowledge of General Education information will improve Description: Including pain rating scale, medication(s)/side effects and non-pharmacologic comfort measures 04/13/2024 1709 by Courtney Chan PARAS, RN Outcome: Progressing 04/13/2024 1708 by Courtney Chan PARAS, RN Outcome: Progressing   Problem: Health Behavior/Discharge Planning: Goal: Ability to manage health-related needs will improve 04/13/2024 1709 by Courtney Chan PARAS, RN Outcome: Progressing 04/13/2024 1708 by Courtney Chan PARAS, RN Outcome: Progressing   Problem: Clinical Measurements: Goal: Ability to maintain clinical measurements within normal limits will improve 04/13/2024 1709 by Courtney Chan PARAS, RN Outcome: Progressing 04/13/2024 1708 by Courtney Chan PARAS, RN Outcome: Progressing Goal: Will remain free from infection 04/13/2024 1709 by Courtney Chan PARAS, RN Outcome: Progressing 04/13/2024 1708 by Courtney Chan PARAS, RN Outcome: Progressing Goal: Diagnostic test results will improve 04/13/2024 1709 by Courtney Chan PARAS, RN Outcome: Progressing 04/13/2024 1708 by Courtney Chan PARAS, RN Outcome: Progressing Goal: Respiratory complications will improve 04/13/2024 1709 by Courtney Chan PARAS, RN Outcome: Progressing 04/13/2024 1708 by Courtney Chan PARAS, RN Outcome: Progressing Goal: Cardiovascular complication will be avoided 04/13/2024 1709 by Courtney Chan PARAS, RN Outcome: Progressing 04/13/2024 1708 by Courtney Chan PARAS, RN Outcome: Progressing   Problem: Activity: Goal: Risk for activity intolerance will decrease 04/13/2024 1709 by Courtney Chan PARAS, RN Outcome: Progressing 04/13/2024 1708 by  Jurnie Garritano, Chan PARAS, RN Outcome: Progressing   Problem: Nutrition: Goal: Adequate nutrition will be maintained 04/13/2024 1709 by Courtney Chan PARAS, RN Outcome: Progressing 04/13/2024 1708 by Courtney Chan PARAS, RN Outcome: Progressing   Problem: Coping: Goal: Level of anxiety will decrease 04/13/2024 1709 by Courtney Chan PARAS, RN Outcome: Progressing 04/13/2024 1708 by Courtney Chan PARAS, RN Outcome: Progressing   Problem: Elimination: Goal: Will not experience complications related to bowel motility 04/13/2024 1709 by Courtney Chan PARAS, RN Outcome: Progressing 04/13/2024 1708 by Courtney Chan PARAS, RN Outcome: Progressing Goal: Will not experience complications related to urinary retention 04/13/2024 1709 by Courtney Chan PARAS, RN Outcome: Progressing 04/13/2024 1708 by Courtney Chan PARAS, RN Outcome: Progressing   Problem: Pain Managment: Goal: General experience of comfort will improve and/or be controlled 04/13/2024 1709 by Courtney Chan PARAS, RN Outcome: Progressing 04/13/2024 1708 by Courtney Chan PARAS, RN Outcome: Progressing   Problem: Safety: Goal: Ability to remain free from injury will improve 04/13/2024 1709 by Courtney Chan PARAS, RN Outcome: Progressing 04/13/2024 1708 by Courtney Chan PARAS, RN Outcome: Progressing   Problem: Skin Integrity: Goal: Risk for impaired skin integrity will decrease 04/13/2024 1709 by Courtney Chan PARAS, RN Outcome: Progressing 04/13/2024 1708 by Courtney Chan PARAS, RN Outcome: Progressing

## 2024-04-13 NOTE — Care Management Obs Status (Signed)
 MEDICARE OBSERVATION STATUS NOTIFICATION   Patient Details  Name: Courtney Chan MRN: [id number] Date of Birth: 28-Mar-1931   Medicare Observation Status Notification Given:  Yes    Wesam Gearhart W, CMA 04/13/2024, 10:16 AM

## 2024-04-13 NOTE — Progress Notes (Signed)
 PT Cancellation Note  Patient Details Name: KELISSA MERLIN MRN: [id number] DOB: 01-Feb-1931   Cancelled Treatment:    Reason Eval/Treat Not Completed: PT screened, no needs identified, will sign off. Per OT report patient is at her baseline and has no skilled PT needs at this time.    Jeanette Rauth 04/13/2024, 9:11 AM

## 2024-04-13 NOTE — Progress Notes (Addendum)
 PROGRESS NOTE    Courtney Chan  FMW:[id number] DOB: [birth year] DOA: 04/12/2024 PCP: Alla Amis, MD  No chief complaint on file.   Hospital Course:  Courtney Chan is a 89 y.o. female with medical history significant of stroke and TIA, focal seizure, HTN, HLD, PVD, dCHF, hypothyroidism, GERD, dementia, CKD-3a, A-fib on Eliquis , cardiac arrest, who presents with garbled speech.  Seen in UC and then presented to the ER.  Workup shows MRI with acute infarct in left thalamus.  Hospital course as below  Subjective: Patient was examined at bedside, new to me today. Daughter Devere present at the bedside.  Reports continues to have garbled speech but improved since presentation, denies any new complaints today, no diarrhea BP soft, not on antihypertensives   Objective: Vitals:   04/12/24 2018 04/13/24 0043 04/13/24 0443 04/13/24 0722  BP:  136/78 139/88   Pulse:  (!) 102 92   Resp:      Temp:  97.7 F (36.5 C) 97.6 F (36.4 C)   TempSrc:   Axillary   SpO2:  97% 96% 95%  Weight: 58.7 kg     Height: 5' 4.5 (1.638 m)      No intake or output data in the 24 hours ending 04/13/24 0850 Filed Weights   04/12/24 1339 04/12/24 2018  Weight: 65.8 kg 58.7 kg    Examination: General: Not in acute distress Cardiac: Tachycardic, irregularly irregular rhythm Respiratory: No rales, wheezing, rhonchi or rubs GI: Soft, nondistended, nontender, no rebound pain, no organomegaly, BS present. Ext: No pitting leg edema bilaterally Musculoskeletal: No joint deformities, No joint redness or warmth, no limitation of ROM in spin. Skin: No rashes Neuro: Alert, following command, cranial nerves II-XII grossly intact, moves all extremities normally. Muscle strength 5/5 in all extremities, sensation to light touch intact.   Assessment & Plan:  Acute CVA - MRI showed acute linear 7 mm infarct in the left thalamus, remote right MCA territory infarct with ex vacuo dilation of the right lateral  ventricle. CTA negative for LVO - LDL 87, HbA1c pending - Resume Eliquis  2.5 mg bid (was on 5mg  bid at home) - meets age, weight criteria - Antiplatelet therapy not indicated - Inc atorvastatin  to 80mg  - Seen by Neurology, appreciate recs - Pending Echo - Seen by SLP/PT/OT - will need speech therapy - Neurochecks q4, Telemetry monitoring - Allow permissive hypertension  Myocardial injury: trop 29 --> 29.  Likely demand ischemia. - On Lipitor    Cough: On my examination no wheezing.  No oxygen desaturation.  Chest x-ray negative for infiltration.  PCR negative for COVID, flu and RSV. - Bronchodilators and as needed Mucinex  - RVP   Atrial fibrillation with RVR: Heart rate 110-120s - Continue metoprolol  25 mg daily (patient received extra dose of metoprolol  in ED) - IV metoprolol  2.5 mg every 2 hour for heart rate> 125 - Resume Eliquis  at lower dose   Essential hypertension: - prn IV hydralazine  for SBP>220 or dBP>120 - Will not hold metoprolol  25 mg daily due to A fib with RVR   Chronic diastolic CHF (congestive heart failure) - 2D echo on 07/13/2022 showed EF of 55 to 60%.  Patient does not have leg edema, proBNP 799, CHF seems to be compensated. not taking diuretics. - Watch volume status closely, discontinue IV fluids  Acquired hypothyroidism - Synthroid    HLD (hyperlipidemia) - Lipitor    Chronic kidney disease, stage 3a: Stable.  GFR> 60. - Follow-up with BMP   Focal seizure: Patient is  taking as needed Ativan  at home - Seizure precaution - As needed Ativan  for seizure   Dementia: No behavior disturbance.  - Fall precaution   DVT prophylaxis: Eliquis    Code Status: Limited: Do not attempt resuscitation (DNR) -DNR-LIMITED -Do Not Intubate/DNI  Disposition:  Home  Consultants:  Neurology  Procedures:  None  Antimicrobials:  Anti-infectives (From admission, onward)    Start     Dose/Rate Route Frequency Ordered Stop   04/12/24 1830  amoxicillin-clavulanate  (AUGMENTIN) 875-125 MG per tablet 1 tablet  Status:  Discontinued        1 tablet Oral  Once 04/12/24 1815 04/12/24 1815   04/12/24 0000  amoxicillin-clavulanate (AUGMENTIN) 875-125 MG tablet  Status:  Discontinued        1 tablet Oral 2 times daily 04/12/24 1815 04/12/24        Data Reviewed: I have personally reviewed following labs and imaging studies CBC: Recent Labs  Lab 04/12/24 1343  WBC 8.3  NEUTROABS 5.3  HGB 13.5  HCT 42.2  MCV 95.7  PLT 339   Basic Metabolic Panel: Recent Labs  Lab 04/12/24 1343  NA 139  K 4.5  CL 102  CO2 27  GLUCOSE 97  BUN 17  CREATININE 0.82  CALCIUM  9.7   GFR: Estimated Creatinine Clearance: 39.4 mL/min (by C-G formula based on SCr of 0.82 mg/dL). Liver Function Tests: Recent Labs  Lab 04/12/24 1343  AST 25  ALT 14  ALKPHOS 147*  BILITOT 0.6  PROT 7.3  ALBUMIN 3.9   CBG: No results for input(s): GLUCAP in the last 168 hours.  Recent Results (from the past 240 hours)  Resp panel by RT-PCR (RSV, Flu A&B, Covid) Urine, Catheterized     Status: None   Collection Time: 04/12/24  4:00 PM   Specimen: Urine, Catheterized; Nasal Swab  Result Value Ref Range Status   SARS Coronavirus 2 by RT PCR NEGATIVE NEGATIVE Final    Comment: (NOTE) SARS-CoV-2 target nucleic acids are NOT DETECTED.  The SARS-CoV-2 RNA is generally detectable in upper respiratory specimens during the acute phase of infection. The lowest concentration of SARS-CoV-2 viral copies this assay can detect is 138 copies/mL. A negative result does not preclude SARS-Cov-2 infection and should not be used as the sole basis for treatment or other patient management decisions. A negative result may occur with  improper specimen collection/handling, submission of specimen other than nasopharyngeal swab, presence of viral mutation(s) within the areas targeted by this assay, and inadequate number of viral copies(<138 copies/mL). A negative result must be combined  with clinical observations, patient history, and epidemiological information. The expected result is Negative.  Fact Sheet for Patients:  bloggercourse.com  Fact Sheet for Healthcare Providers:  seriousbroker.it  This test is no t yet approved or cleared by the [place]  FDA and  has been authorized for detection and/or diagnosis of SARS-CoV-2 by FDA under an Emergency Use Authorization (EUA). This EUA will remain  in effect (meaning this test can be used) for the duration of the COVID-19 declaration under Section 564(b)(1) of the Act, 21 U.S.C.section 360bbb-3(b)(1), unless the authorization is terminated  or revoked sooner.       Influenza A by PCR NEGATIVE NEGATIVE Final   Influenza B by PCR NEGATIVE NEGATIVE Final    Comment: (NOTE) The Xpert Xpress SARS-CoV-2/FLU/RSV plus assay is intended as an aid in the diagnosis of influenza from Nasopharyngeal swab specimens and should not be used as a sole basis for treatment. Nasal  washings and aspirates are unacceptable for Xpert Xpress SARS-CoV-2/FLU/RSV testing.  Fact Sheet for Patients: bloggercourse.com  Fact Sheet for Healthcare Providers: seriousbroker.it  This test is not yet approved or cleared by the [place]  FDA and has been authorized for detection and/or diagnosis of SARS-CoV-2 by FDA under an Emergency Use Authorization (EUA). This EUA will remain in effect (meaning this test can be used) for the duration of the COVID-19 declaration under Section 564(b)(1) of the Act, 21 U.S.C. section 360bbb-3(b)(1), unless the authorization is terminated or revoked.     Resp Syncytial Virus by PCR NEGATIVE NEGATIVE Final    Comment: (NOTE) Fact Sheet for Patients: bloggercourse.com  Fact Sheet for Healthcare Providers: seriousbroker.it  This test is not yet approved  or cleared by the [place]  FDA and has been authorized for detection and/or diagnosis of SARS-CoV-2 by FDA under an Emergency Use Authorization (EUA). This EUA will remain in effect (meaning this test can be used) for the duration of the COVID-19 declaration under Section 564(b)(1) of the Act, 21 U.S.C. section 360bbb-3(b)(1), unless the authorization is terminated or revoked.  Performed at [facility], [address] [place], [place] [address]      Radiology Studies: CT ANGIO HEAD NECK W WO CM (CODE STROKE) Result Date: 04/12/2024 CLINICAL DATA:  Initial evaluation for acute stroke. EXAM: CT ANGIOGRAPHY HEAD AND NECK WITH AND WITHOUT CONTRAST TECHNIQUE: Multidetector CT imaging of the head and neck was performed using the standard protocol during bolus administration of intravenous contrast. Multiplanar CT image reconstructions and MIPs were obtained to evaluate the vascular anatomy. Carotid stenosis measurements (when applicable) are obtained utilizing NASCET criteria, using the distal internal carotid diameter as the denominator. RADIATION DOSE REDUCTION: This exam was performed according to the departmental dose-optimization program which includes automated exposure control, adjustment of the mA and/or kV according to patient size and/or use of iterative reconstruction technique. CONTRAST:  75mL OMNIPAQUE  IOHEXOL  350 MG/ML SOLN COMPARISON:  Comparison made with CT and MRI from earlier the same day. FINDINGS: CTA NECK FINDINGS Aortic arch: Visualized aortic arch within normal limits for caliber with standard branch pattern. Aortic atherosclerosis. No significant stenosis about the origin the great vessels. Right carotid system: Right common and internal carotid arteries are patent without dissection. Atheromatous change about the right carotid bulb without hemodynamically significant greater than 50% stenosis. Left carotid system: Left common and internal carotid arteries are  tortuous but patent without dissection. Mild atheromatous change about the left carotid bulb without hemodynamically significant greater than 50% stenosis. Vertebral arteries: Both vertebral arteries arise from subclavian arteries. No proximal subclavian artery stenosis. Vertebral arteries are patent without stenosis or dissection. Skeleton: No worrisome osseous lesions. Chronic compression deformity involving the C7 vertebral body noted, similar to prior. Other neck: No other acute finding. Upper chest: Linear atelectatic changes noted within the left upper lobe. No other acute finding. Review of the MIP images confirms the above findings CTA HEAD FINDINGS Anterior circulation: Atheromatous change about the carotid siphons without hemodynamically significant stenosis. A1 segments patent bilaterally. Normal anterior communicating complex. Anterior cerebral arteries patent without stenosis. Normal in stenosis or occlusion. No proximal MCA branch occlusion or high-grade stenosis. Distal MCA branches perfused and symmetric. Posterior circulation: Both V4 segments patent without stenosis. Both PICA patent at their origins. Basilar patent without stenosis. Superior cerebellar arteries patent bilaterally. Atheromatous irregularity about the PCAs with associated mild right and mild-to-moderate left bilateral P2 stenoses (series 7, image 20). Venous sinuses: Patent allowing for timing the  contrast bolus. Anatomic variants: None significant.  No aneurysm. Review of the MIP images confirms the above findings IMPRESSION: 1. Negative CTA for large vessel occlusion or other emergent finding. 2. Atheromatous irregularity about the PCAs with associated mild right and mild-to-moderate left P2 stenoses. 3. Additional mild atheromatous change about the carotid bifurcations and carotid siphons without hemodynamically significant stenosis. Aortic Atherosclerosis (ICD10-I70.0). Electronically Signed   By: Morene Hoard M.D.   On:  04/12/2024 21:58   MR BRAIN WO CONTRAST Result Date: 04/12/2024 EXAM: MRI BRAIN WITHOUT CONTRAST 04/12/2024 06:05:46 PM TECHNIQUE: Multiplanar multisequence MRI of the head/brain was performed without the administration of intravenous contrast. COMPARISON: CT head without contrast 04/12/2024 and MR head without contrast 08/03/2023. CLINICAL HISTORY: Neuro deficit, acute, stroke suspected. No onset of slurred speech. FINDINGS: BRAIN AND VENTRICLES: An acute linear 7 mm infarct is present in the left thalamus. A remote right MCA territory infarct and ex vacuo dilation of the right lateral ventricle is noted. Atrophy and white matter disease are stable. No intracranial hemorrhage. No mass. No midline shift. No hydrocephalus. The sella is unremarkable. Normal flow voids. ORBITS: Bilateral lens replacements are noted. The globes and orbits are otherwise within normal limits. SINUSES AND MASTOIDS: No acute abnormality. BONES AND SOFT TISSUES: Normal marrow signal. No acute soft tissue abnormality. IMPRESSION: 1. Acute linear 7 mm infarct in the left thalamus. 2. Remote right MCA territory infarct with ex vacuo dilation of the right lateral ventricle. 3. Stable atrophy and white matter disease. Cortical vein findings were called to Dr. Waymond at 6:36 pm Electronically signed by: Lonni Necessary MD 04/12/2024 06:37 PM EST RP Workstation: [id number]   CT Head Wo Contrast Result Date: 04/12/2024 CLINICAL DATA:  Mental status change of unknown cause. Generalized weakness upon wakening today. EXAM: CT HEAD WITHOUT CONTRAST TECHNIQUE: Contiguous axial images were obtained from the base of the skull through the vertex without intravenous contrast. RADIATION DOSE REDUCTION: This exam was performed according to the departmental dose-optimization program which includes automated exposure control, adjustment of the mA and/or kV according to patient size and/or use of iterative reconstruction technique. COMPARISON:  MRI  08/03/2023.  CT 08/03/2023. FINDINGS: Brain: No focal abnormality seen affecting the brainstem or cerebellum. Cerebral hemispheres show an old infarction in the right insula and frontal operculum. Elsewhere, chronic small-vessel ischemic changes affect the hemispheric white matter, moderate to advanced. No sign of acute infarction, mass lesion, hemorrhage, hydrocephalus or extra-axial collection. Vascular: There is atherosclerotic calcification of the major vessels at the base of the brain. Skull: Negative Sinuses/Orbits: Clear/normal Other: None IMPRESSION: No acute CT finding. Old infarction in the right insula and frontal operculum. Chronic small-vessel ischemic changes of the hemispheric white matter, moderate to advanced. Electronically Signed   By: Oneil Officer M.D.   On: 04/12/2024 14:05   DG Chest 2 View Result Date: 04/12/2024 CLINICAL DATA:  Cough for 3 days.  Hypoxia. EXAM: CHEST - 2 VIEW COMPARISON:  09/16/2023 FINDINGS: The heart size and mediastinal contours are within normal limits. Low lung volumes again noted. Both lungs are clear. IMPRESSION: Low lung volumes. No active cardiopulmonary disease. Electronically Signed   By: Norleen DELENA Kil M.D.   On: 04/12/2024 11:28    Scheduled Meds:   stroke: early stages of recovery book   Does not apply Once   aspirin  EC  325 mg Oral Daily   atorvastatin   40 mg Oral q1800   heparin   5,000 Units Subcutaneous Q8H   levothyroxine   88 mcg Oral Daily  metoprolol  succinate  25 mg Oral Daily   Continuous Infusions:  sodium chloride  75 mL/hr at 04/12/24 2115     LOS: 0 days  MDM: Patient is high risk for one or more organ failure.  They necessitate ongoing hospitalization for continued IV therapies and subsequent lab monitoring. Total time spent interpreting labs and vitals, reviewing the medical record, coordinating care amongst consultants and care team members, directly assessing and discussing care with the patient and/or family: 55 min Laree Lock, MD [facility]  To contact the attending physician between 7A-7P please use Epic Chat. To contact the covering physician during after hours 7P-7A, please review Amion.  04/13/2024, 8:50 AM   *This document has been created with the assistance of dictation software. Please excuse typographical errors. *

## 2024-04-13 NOTE — Evaluation (Signed)
 Speech Language Pathology Evaluation Patient Details Name: Courtney Chan MRN: [id number] DOB: 25-May-1930 Today's Date: 04/13/2024 Time: 9064-9047 SLP Time Calculation (min) (ACUTE ONLY): 17 min  Problem List:  Patient Active Problem List   Diagnosis Date Noted   Stroke (HCC) 04/12/2024   HLD (hyperlipidemia) 04/12/2024   Chronic diastolic CHF (congestive heart failure) (HCC) 04/12/2024   Myocardial injury 04/12/2024   Cough 04/12/2024   Dementia (HCC) 07/13/2022   TIA (transient ischemic attack) 07/12/2022   Garbled speech 06/04/2022   Focal seizure (HCC) 05/29/2021   Hypothyroidism    Chronic kidney disease    Supplemental oxygen dependent    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   Debility 07/04/2019   CHF (congestive heart failure) (HCC)    Atrial fibrillation (HCC)    Acute cerebral infarction (HCC)    Non-ST elevation (NSTEMI) myocardial infarction (HCC)    Non-ST elevation MI (NSTEMI) (HCC) 06/28/2019   Cardiac arrest due to underlying cardiac condition (HCC) 06/28/2019   Cardiac arrest (HCC) 06/28/2019   Ventilator dependent (HCC)    Coronary artery disease involving native coronary artery of native heart without angina pectoris 03/30/2019   PAD (peripheral artery disease) 01/11/2019   Acute cystitis without hematuria 12/31/2018   Diarrhea 12/31/2018   Chronic kidney disease, stage 3a (HCC) 05/27/2018   History of TIA (transient ischemic attack) 05/27/2018   Persistent atrial fibrillation (HCC) 08/25/2017   Osteopenia of multiple sites 11/09/2016   DNR (do not resuscitate) 10/31/2016   Encounter for general adult medical examination without abnormal findings 05/02/2016   Vaccine counseling 11/01/2015   Borderline diabetes mellitus 11/03/2013   Pure hypercholesterolemia 11/03/2013   Atrial fibrillation with RVR (HCC) 09/14/2013   Acquired hypothyroidism 09/14/2013   Diverticulosis 09/14/2013   Essential hypertension 09/14/2013   GERD without  esophagitis 09/14/2013   Mitral valve prolapse 09/14/2013   H/O cardiac catheterization 08/12/2008   Past Medical History:  Past Medical History:  Diagnosis Date   Atrial fibrillation (HCC) 2011   Hyperlipidemia    Hypertension    Stroke (HCC)    Thyroid  disease    Past Surgical History:  Past Surgical History:  Procedure Laterality Date   ABDOMINAL HYSTERECTOMY     BREAST BIOPSY Left 1980   Negative   CORONARY/GRAFT ACUTE MI REVASCULARIZATION N/A 06/28/2019   Procedure: Coronary/Graft Acute MI Revascularization;  Surgeon: Anner Alm ORN, MD;  Location: [facility] INVASIVE CV LAB;  Service: Cardiovascular;  Laterality: N/A;   LEFT HEART CATH AND CORONARY ANGIOGRAPHY N/A 06/28/2019   Procedure: LEFT HEART CATH AND CORONARY ANGIOGRAPHY;  Surgeon: Anner Alm ORN, MD;  Location: [facility] INVASIVE CV LAB;  Service: Cardiovascular;  Laterality: N/A;   HPI:  Pt is a 89 y.o. 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 PMH of stroke and TIA, focal seizure, HTN, HLD, PVD, dCHF, hypothyroidism, GERD, dementia, CKD-3a, A-fib on Eliquis , cardiac arrest.   Assessment / Plan / Recommendation Clinical Impression  Pt seen for speech/language evaluation, full cognitive-communication evaluation deferred due to speech/language difficulty and PMHx of dementia. Pt alert, pleasant, cooperative. Flat affect. Received upright in recliner with daughter at bedside. Assessment completed via informal means and portions of [employer] [facility] Bedside Record Form. Pt presents with s/sx mild-moderate communication difficulty with s/sx aphasia and dysarthria which is most appreciated conversationally. Pt's speech is c/b intermittent anomia with inconsistent repair as well as hoarse vocal quality, articulatory imprecision, occasional fast rate, and reduced loudness. Pt  benefits from cues for slow speech and increased vocal loudness as well as semantic/phonemic cueing for  anomia. Reduced auditory comprehension for complex/lengthy information noted. ?if this is pt's baseline. Full aphasia and motor speech evaluation recommended at next level of care in home/functional setting. Recommend HH ST for above mentioned deficits.    SLP Assessment  SLP Recommendation/Assessment: All further Speech Language Pathology needs can be addressed in the next venue of care SLP Visit Diagnosis: Dysarthria and anarthria (R47.1);Aphasia (R47.01)     Assistance Recommended at Discharge  Frequent or constant Supervision/Assistance           SLP Evaluation Cognition  Overall Cognitive Status: Difficult to assess (due to speech/language deficits) Orientation Level: Oriented to person       Comprehension  Auditory Comprehension Overall Auditory Comprehension: Impaired Yes/No Questions: Within Functional Limits (basic WFL) Commands: Impaired One Step Basic Commands: 75-100% accurate Conversation: Simple Visual Recognition/Discrimination Discrimination: Not tested Reading Comprehension Reading Status: Not tested    Expression Expression Primary Mode of Expression: Verbal Verbal Expression Overall Verbal Expression: Impaired Initiation: No impairment Automatic Speech: Name;Social Response (WFL) Repetition: Impaired Level of Impairment: Phrase level;Sentence level Naming: Impairment Confrontation: Within functional limits (items in room) Divergent:  (8 animals in 60s; perseveration) Other Naming Comments: anomia appreciated conversationally Verbal Errors: Perseveration Pragmatics: Impairment Impairments: Abnormal affect;Topic maintenance (flat affect) Interfering Components: Premorbid deficit;Speech intelligibility Effective Techniques: Phonemic cues;Semantic cues Written Expression Dominant Hand: Right Written Expression: Not tested   Oral / Motor  Oral Motor/Sensory Function Overall Oral Motor/Sensory Function: Within functional limits Motor Speech Overall  Motor Speech: Impaired Respiration: Within functional limits Phonation: Hoarse Resonance: Within functional limits Articulation: Impaired Level of Impairment:  (all levels; inconsistent) Intelligibility: Intelligibility reduced Word: 75-100% accurate Phrase: 75-100% accurate Sentence: 50-74% accurate Conversation: 50-74% accurate Motor Planning: Within functional limits Effective Techniques: Slow rate;Increased vocal intensity           Delon Bangs, M.S., CCC-SLP Speech-Language Pathologist [facility] [phone] (ASCOM)  Delon CHRISTELLA Bangs 04/13/2024, 10:02 AM

## 2024-04-13 NOTE — Evaluation (Signed)
 Occupational Therapy Evaluation Patient Details Name: Courtney Chan MRN: [id number] DOB: 1930/12/12 Today's Date: 04/13/2024   History of Present Illness   Pt is a 89 y.o. 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 PMH of stroke and TIA, focal seizure, HTN, HLD, PVD, dCHF, hypothyroidism, GERD, dementia, CKD-3a, A-fib on Eliquis , cardiac arrest.     Clinical Impressions Pt was seen for OT evaluation this date. PTA, pt required some assist from her spouse for LB dressing and occasionally for shower transfers. Otherwise her daughter at bedside states she is able to bathe herself, perform toileting, and HH mobility with a 4WW. Per dtr, pt and spouse walk ~1 mile daily. Pt lives with her spouse in a 1 level home and has a PCA who visits several days a week to assist with light housework. Pt is able to exit bed with use of bed features and Min/Mod A which per daughter is baseline level of function. Pt's speech appears improved compared to yesterday per daughter, she is alert and oriented x3-4 and speaking fluently during session. Required Min A for standing and toilet transfer during session and Max A for peri-care. Ambulated ~20 ft using RW with CGA/SBA and per daughter is physically at her baseline. No further acute OT needs and none indicated on DC. Will sign off in house.     If plan is discharge home, recommend the following:         Functional Status Assessment   Patient has not had a recent decline in their functional status     Equipment Recommendations   None recommended by OT     Recommendations for Other Services         Precautions/Restrictions   Precautions Precautions: Fall Recall of Precautions/Restrictions: Impaired Restrictions Weight Bearing Restrictions Per Provider Order: No     Mobility Bed Mobility Overal bed mobility: Needs Assistance Bed Mobility: Supine to Sit     Supine to sit: Min  assist, Mod assist, HOB elevated, Used rails     General bed mobility comments: pt has assist with bed mobiltiy at baseline from her hospital bed, per daughter she is at baseline    Transfers Overall transfer level: Needs assistance Equipment used: Rolling walker (2 wheels) Transfers: Sit to/from Stand Sit to Stand: Min assist           General transfer comment: assist for lift off to stand at RW then able to ambulate with CGA/SBA and RW use-per dtr pt is at baseline      Balance Overall balance assessment: Needs assistance Sitting-balance support: Feet supported, Single extremity supported Sitting balance-Leahy Scale: Good     Standing balance support: Reliant on assistive device for balance, Bilateral upper extremity supported Standing balance-Leahy Scale: Good Standing balance comment: RW use and SBA-baseline                           ADL either performed or assessed with clinical judgement   ADL Overall ADL's : Needs assistance/impaired                         Toilet Transfer: Minimal assistance;Comfort height toilet;Rolling walker (2 wheels)   Toileting- Clothing Manipulation and Hygiene: Maximal assistance;Sit to/from stand       Functional mobility during ADLs: Rolling walker (2 wheels);Contact guard assist       Vision  Perception         Praxis         Pertinent Vitals/Pain Pain Assessment Pain Assessment: No/denies pain     Extremity/Trunk Assessment Upper Extremity Assessment Upper Extremity Assessment: Overall WFL for tasks assessed;Generalized weakness   Lower Extremity Assessment Lower Extremity Assessment: Overall WFL for tasks assessed;Generalized weakness       Communication Communication Communication: Impaired Factors Affecting Communication: Hearing impaired   Cognition Arousal: Alert Behavior During Therapy: WFL for tasks assessed/performed Cognition: No apparent impairments              OT - Cognition Comments: alert and oriented to person, place and month                 Following commands: Intact       Cueing  General Comments   Cueing Techniques: Verbal cues      Exercises Other Exercises Other Exercises: Edu on role of OT in acute setting.   Shoulder Instructions      Home Living Family/patient expects to be discharged to:: Private residence Living Arrangements: Spouse/significant other Available Help at Discharge: Family;Personal care attendant;Available 24 hours/day Type of Home: House Home Access: Ramped entrance     Home Layout: One level     Bathroom Shower/Tub: Producer, Television/film/video: Handicapped height Bathroom Accessibility: Yes   Home Equipment: Rollator (4 wheels);Cane - single point;Wheelchair - manual;Hand held shower head;Grab bars - tub/shower;Shower seat          Prior Functioning/Environment Prior Level of Function : Needs assist             Mobility Comments: rollator using in the home, walks ~1 mile daily (2 bouts of 0.5 miles) pt is CGA/SBA during all mobility d/t h/o falls-none recently ADLs Comments: spouse assists with all showering and pt is total assist for dressing and toileting; she is able to feed self and perform grooming tasks    OT Problem List:     OT Treatment/Interventions:        OT Goals(Current goals can be found in the care plan section)       OT Frequency:       Co-evaluation              AM-PAC OT 6 Clicks Daily Activity     Outcome Measure Help from another person eating meals?: A Little Help from another person taking care of personal grooming?: A Little Help from another person toileting, which includes using toliet, bedpan, or urinal?: A Lot Help from another person bathing (including washing, rinsing, drying)?: A Lot Help from another person to put on and taking off regular upper body clothing?: A Lot Help from another person to put on and taking off  regular lower body clothing?: A Lot 6 Click Score: 14   End of Session Equipment Utilized During Treatment: Gait belt;Rolling walker (2 wheels) Nurse Communication: Mobility status  Activity Tolerance: Patient tolerated treatment well Patient left: in chair;with call bell/phone within reach;with family/visitor present  OT Visit Diagnosis: Other abnormalities of gait and mobility (R26.89)                Time: 9163-9097 OT Time Calculation (min): 26 min Charges:  OT General Charges $OT Visit: 1 Visit OT Evaluation $OT Eval Low Complexity: 1 Low  Artem Bunte Chrismon, OTR/L 04/13/24, 9:53 AM  Lyndle Pang E Chrismon 04/13/2024, 9:51 AM

## 2024-04-14 DIAGNOSIS — I639 Cerebral infarction, unspecified: Secondary | ICD-10-CM | POA: Diagnosis not present

## 2024-04-14 LAB — ECHOCARDIOGRAM COMPLETE
AR max vel: 1.34 cm2
AV Area VTI: 1.1 cm2
AV Area mean vel: 1.2 cm2
AV Mean grad: 9.3 mmHg
AV Peak grad: 17.1 mmHg
Ao pk vel: 2.07 m/s
Area-P 1/2: 5.88 cm2
Calc EF: 51.8 %
Height: 64.5 in
MV M vel: 2.52 m/s
MV Peak grad: 25.4 mmHg
MV VTI: 2.57 cm2
S' Lateral: 3.7 cm
Single Plane A2C EF: 49.3 %
Single Plane A4C EF: 58.2 %
Weight: 2070.56 [oz_av]

## 2024-04-14 LAB — CBC
HCT: 39 % (ref 36.0–46.0)
Hemoglobin: 12.2 g/dL (ref 12.0–15.0)
MCH: 30 pg (ref 26.0–34.0)
MCHC: 31.3 g/dL (ref 30.0–36.0)
MCV: 96.1 fL (ref 80.0–100.0)
Platelets: 331 K/uL (ref 150–400)
RBC: 4.06 MIL/uL (ref 3.87–5.11)
RDW: 15.8 % — ABNORMAL HIGH (ref 11.5–15.5)
WBC: 7.1 K/uL (ref 4.0–10.5)
nRBC: 0 % (ref 0.0–0.2)

## 2024-04-14 LAB — BASIC METABOLIC PANEL WITH GFR
Anion gap: 9 (ref 5–15)
BUN: 22 mg/dL (ref 8–23)
CO2: 27 mmol/L (ref 22–32)
Calcium: 9.2 mg/dL (ref 8.9–10.3)
Chloride: 106 mmol/L (ref 98–111)
Creatinine, Ser: 0.87 mg/dL (ref 0.44–1.00)
GFR, Estimated: >60 mL/min (ref >60)
Glucose, Bld: 88 mg/dL (ref 70–99)
Potassium: 4.4 mmol/L (ref 3.5–5.1)
Sodium: 143 mmol/L (ref 135–145)

## 2024-04-14 MED ORDER — ATORVASTATIN CALCIUM 80 MG PO TABS: 80.0000 mg | ORAL_TABLET | Freq: Every day | ORAL | 1 refills | Status: AC

## 2024-04-14 MED ORDER — APIXABAN 2.5 MG PO TABS: 2.5000 mg | ORAL_TABLET | Freq: Two times a day (BID) | ORAL | 1 refills | Status: AC

## 2024-04-14 MED ORDER — METOPROLOL SUCCINATE ER 25 MG PO TB24: 12.5000 mg | ORAL_TABLET | Freq: Once | ORAL | Status: DC

## 2024-04-14 NOTE — Plan of Care (Signed)

## 2024-04-14 NOTE — Discharge Summary (Signed)
 Physician Discharge Summary   Patient: Courtney Chan MRN: [id number] DOB: 19-May-1930  Admit date:     04/12/2024  Discharge date: 04/14/24  Discharge Physician: Laree Lock   PCP: Alla Amis, MD   Recommendations at discharge:   Follow-up with PCP in 1 week -monitor BP, HR  Follow-up with neurology outpatient Speech therapy referral  Discharge Diagnoses: Principal Problem:   Stroke [facility]) Active Problems:   Myocardial injury   Cough   Atrial fibrillation with RVR (HCC)   Essential hypertension   Chronic diastolic CHF (congestive heart failure) (HCC)   Acquired hypothyroidism   HLD (hyperlipidemia)   Chronic kidney disease, stage 3a (HCC)   Focal seizure (HCC)   Dementia [facility])   Hospital Course: Courtney Chan is a 89 y.o. female with medical history significant of stroke and TIA, focal seizure, HTN, HLD, PVD, dCHF, hypothyroidism, GERD, dementia, CKD-3a, A-fib on Eliquis , cardiac arrest, who presents with garbled speech.  Seen in UC and then presented to the ER.  Workup shows MRI with acute infarct in left thalamus.  Hospital course as below Called daughter Devere and updated echo results over the phone  Acute CVA - MRI showed acute linear 7 mm infarct in the left thalamus, remote right MCA territory infarct with ex vacuo dilation of the right lateral ventricle. CTA negative for LVO - Echo shows EF 50 to 55%, RV systolic function reduced, mild MR, trivial AR - LDL 87, HbA1c 5.6 - Resume Eliquis  2.5 mg bid (was on 5mg  bid at home) - meets age, weight criteria - Antiplatelet therapy not indicated - Inc atorvastatin  to 80mg  - Seen by Neurology, follow up outpatient - Seen by SLP/PT/OT - will need speech therapy   Myocardial injury: trop 29 --> 29.  Likely demand ischemia. - On Lipitor    Cough: On my examination no wheezing.  No oxygen desaturation.  Chest x-ray negative for infiltration.  PCR negative for COVID, flu and RSV. - Bronchodilators and as  needed Mucinex    Atrial fibrillation with RVR: - Continue metoprolol  25 mg daily, HR 80-90's - Resume Eliquis  at lower dose   Chronic diastolic CHF (congestive heart failure) - Patient does not have leg edema, proBNP 799, CHF seems to be compensated. not taking diuretics  Acquired hypothyroidism - Synthroid    HLD (hyperlipidemia) - Lipitor    Chronic kidney disease, stage 3a: Stable.  GFR> 60.   Focal seizure: Patient is taking as needed Ativan  at home  Dementia: No behavior disturbance.  - Fall precaution   Consultants: Neurology Procedures performed: None  Disposition: Home Diet recommendation:  Discharge Diet Orders (From admission, onward)     Start     Ordered   04/14/24 0000  Diet - low sodium heart healthy        04/14/24 1248           DISCHARGE MEDICATION: Allergies as of 04/14/2024       Reactions   Metronidazole Diarrhea, Nausea And Vomiting   Oxycodone Other (See Comments)   Altered mental status        Medication List     TAKE these medications    AeroChamber MV inhaler Use as instructed   albuterol  108 (90 Base) MCG/ACT inhaler Commonly known as: VENTOLIN  HFA Inhale 2 puffs into the lungs every 4 (four) hours as needed.   alendronate 70 MG tablet Commonly known as: FOSAMAX Take 70 mg by mouth once a week.   amoxicillin 500 MG capsule Commonly known as: AMOXIL Take 2,000  mg by mouth as directed.   apixaban  2.5 MG Tabs tablet Commonly known as: ELIQUIS  Take 1 tablet (2.5 mg total) by mouth 2 (two) times daily. What changed:  medication strength how much to take   atorvastatin  80 MG tablet Commonly known as: LIPITOR  Take 1 tablet (80 mg total) by mouth daily at 6 PM. What changed:  medication strength how much to take   benzonatate 100 MG capsule Commonly known as: TESSALON Take 2 capsules (200 mg total) by mouth every 8 (eight) hours.   levothyroxine  88 MCG tablet Commonly known as: SYNTHROID  Take 1 tablet (88 mcg  total) by mouth daily.   LORazepam  1 MG tablet Commonly known as: ATIVAN  Take 1-2 mg by mouth as needed for seizure.   metoprolol  succinate 25 MG 24 hr tablet Commonly known as: TOPROL -XL Take 25 mg by mouth daily.   promethazine-dextromethorphan  6.25-15 MG/5ML syrup Commonly known as: PROMETHAZINE-DM Take 2.5 mLs by mouth 4 (four) times daily as needed.        Follow-up Information     Alla Amis, MD Follow up.   Specialty: Family Medicine Why: hospital follow up Contact information: [address] [facility] [place] [address] [phone]                Discharge Exam: Fredricka Weights   04/12/24 1339 04/12/24 2018  Weight: 65.8 kg 58.7 kg    General: Not in acute distress Cardiac:  RRR Respiratory: No rales, wheezing, rhonchi or rubs GI: Soft, nondistended, nontender, no rebound pain, no organomegaly, BS present. Ext: No pitting leg edema bilaterally Musculoskeletal: No joint deformities, No joint redness or warmth, no limitation of ROM in spin. Skin: No rashes Neuro: Alert, following command, cranial nerves II-XII grossly intact, moves all extremities normally. Muscle strength 5/5 in all extremities, sensation to light touch intact.  Condition at discharge: good  The results of significant diagnostics from this hospitalization (including imaging, microbiology, ancillary and laboratory) are listed below for reference.   Imaging Studies: ECHOCARDIOGRAM COMPLETE Result Date: 04/14/2024    ECHOCARDIOGRAM REPORT   Patient Name:   Courtney Chan Date of Exam: 04/13/2024 Medical Rec #:  [id number]        Height:       64.5 in Accession #:    [id number]       Weight:       129.4 lb Date of Birth:  29-Dec-1930         BSA:          1.635 m Patient Age:    89 years         BP:           139/88 mmHg Patient Gender: F                HR:           92 bpm. Exam Location:  [facility] Procedure: 2D Echo, Cardiac Doppler and Color Doppler (Both Spectral  and Color            Flow Doppler were utilized during procedure). Indications:     Stroke I63.9  History:         Patient has prior history of Echocardiogram examinations, most                  recent 07/13/2022. Stroke; Arrythmias:Atrial Fibrillation.  Sonographer:     Ashley McNeely-Sloane Referring Phys:  [id number] XILIN NIU Diagnosing Phys: Cara JONETTA Lovelace MD IMPRESSIONS  1. Left  ventricular ejection fraction, by estimation, is 50 to 55%. The left ventricle has low normal function. The left ventricle has no regional wall motion abnormalities. Left ventricular diastolic function could not be evaluated.  2. Right ventricular systolic function is severely reduced. The right ventricular size is mildly enlarged.  3. Left atrial size was mildly dilated.  4. Right atrial size was mildly dilated.  5. The mitral valve is normal in structure. Mild mitral valve regurgitation.  6. The aortic valve is calcified. Aortic valve regurgitation is trivial. Moderate aortic valve stenosis. FINDINGS  Left Ventricle: Left ventricular ejection fraction, by estimation, is 50 to 55%. The left ventricle has low normal function. The left ventricle has no regional wall motion abnormalities. Strain was performed and the global longitudinal strain is indeterminate. The left ventricular internal cavity size was normal in size. There is no left ventricular hypertrophy. Left ventricular diastolic function could not be evaluated. Right Ventricle: The right ventricular size is mildly enlarged. No increase in right ventricular wall thickness. Right ventricular systolic function is severely reduced. Left Atrium: Left atrial size was mildly dilated. Right Atrium: Right atrial size was mildly dilated. Pericardium: There is no evidence of pericardial effusion. Mitral Valve: The mitral valve is normal in structure. There is moderate thickening of the mitral valve leaflet(s). There is moderate calcification of the mitral valve leaflet(s). Mild mitral valve  regurgitation. MV peak gradient, 5.3 mmHg. The mean mitral valve gradient is 3.0 mmHg. Tricuspid Valve: The tricuspid valve is normal in structure. Tricuspid valve regurgitation is mild. Aortic Valve: The aortic valve is calcified. Aortic valve regurgitation is trivial. Moderate aortic stenosis is present. Aortic valve mean gradient measures 9.3 mmHg. Aortic valve peak gradient measures 17.1 mmHg. Aortic valve area, by VTI measures 1.10 cm. Pulmonic Valve: The pulmonic valve was normal in structure. Pulmonic valve regurgitation is not visualized. Aorta: The ascending aorta was not well visualized. IAS/Shunts: No atrial level shunt detected by color flow Doppler. Additional Comments: 3D was performed not requiring image post processing on an independent workstation and was indeterminate.  LEFT VENTRICLE PLAX 2D LVIDd:         3.70 cm     Diastology LVIDs:         3.70 cm     LV e' medial:    6.40 cm/s LV PW:         1.50 cm     LV E/e' medial:  17.0 LV IVS:        0.90 cm     LV e' lateral:   4.38 cm/s LVOT diam:     1.80 cm     LV E/e' lateral: 24.9 LV SV:         44 LV SV Index:   27 LVOT Area:     2.54 cm  LV Volumes (MOD) LV vol d, MOD A2C: 36.3 ml LV vol d, MOD A4C: 35.9 ml LV vol s, MOD A2C: 18.4 ml LV vol s, MOD A4C: 15.0 ml LV SV MOD A2C:     17.9 ml LV SV MOD A4C:     35.9 ml LV SV MOD BP:      18.3 ml RIGHT VENTRICLE             IVC RV Basal diam:  3.20 cm     IVC diam: 1.40 cm RV Mid diam:    2.60 cm RV S prime:     10.70 cm/s TAPSE (M-mode): 1.6 cm LEFT ATRIUM  Index        RIGHT ATRIUM           Index LA diam:        4.20 cm 2.57 cm/m   RA Area:     15.60 cm LA Vol (A2C):   66.1 ml 40.44 ml/m  RA Volume:   37.30 ml  22.82 ml/m LA Vol (A4C):   64.2 ml 39.28 ml/m LA Biplane Vol: 65.0 ml 39.76 ml/m  AORTIC VALVE                     PULMONIC VALVE AV Area (Vmax):    1.34 cm      PV Vmax:        0.94 m/s AV Area (Vmean):   1.20 cm      PV Vmean:       64.000 cm/s AV Area (VTI):     1.10  cm      PV VTI:         0.161 m AV Vmax:           207.00 cm/s   PV Peak grad:   3.5 mmHg AV Vmean:          141.333 cm/s  PV Mean grad:   2.0 mmHg AV VTI:            0.397 m       RVOT Peak grad: 1 mmHg AV Peak Grad:      17.1 mmHg AV Mean Grad:      9.3 mmHg LVOT Vmax:         109.00 cm/s LVOT Vmean:        66.500 cm/s LVOT VTI:          0.172 m LVOT/AV VTI ratio: 0.43  AORTA Ao Root diam: 3.10 cm Ao Asc diam:  2.80 cm MITRAL VALVE                TRICUSPID VALVE MV Area (PHT): 5.88 cm     TR Peak grad:   26.4 mmHg MV Area VTI:   2.57 cm     TR Mean grad:   15.0 mmHg MV Peak grad:  5.3 mmHg     TR Vmax:        257.00 cm/s MV Mean grad:  3.0 mmHg     TR Vmean:       188.0 cm/s MV Vmax:       1.15 m/s MV Vmean:      76.1 cm/s    SHUNTS MV Decel Time: 129 msec     Systemic VTI:  0.17 m MR Peak grad: 25.4 mmHg     Systemic Diam: 1.80 cm MR Vmax:      252.00 cm/s   Pulmonic VTI:  0.084 m MV E velocity: 109.00 cm/s Cara JONETTA Lovelace MD Electronically signed by Cara JONETTA Lovelace MD Signature Date/Time: 04/14/2024/12:38:32 PM    Final    CT ANGIO HEAD NECK W WO CM (CODE STROKE) Result Date: 04/12/2024 CLINICAL DATA:  Initial evaluation for acute stroke. EXAM: CT ANGIOGRAPHY HEAD AND NECK WITH AND WITHOUT CONTRAST TECHNIQUE: Multidetector CT imaging of the head and neck was performed using the standard protocol during bolus administration of intravenous contrast. Multiplanar CT image reconstructions and MIPs were obtained to evaluate the vascular anatomy. Carotid stenosis measurements (when applicable) are obtained utilizing NASCET criteria, using the distal internal carotid diameter as the denominator. RADIATION DOSE REDUCTION: This exam was performed according to the  departmental dose-optimization program which includes automated exposure control, adjustment of the mA and/or kV according to patient size and/or use of iterative reconstruction technique. CONTRAST:  75mL OMNIPAQUE  IOHEXOL  350 MG/ML SOLN COMPARISON:   Comparison made with CT and MRI from earlier the same day. FINDINGS: CTA NECK FINDINGS Aortic arch: Visualized aortic arch within normal limits for caliber with standard branch pattern. Aortic atherosclerosis. No significant stenosis about the origin the great vessels. Right carotid system: Right common and internal carotid arteries are patent without dissection. Atheromatous change about the right carotid bulb without hemodynamically significant greater than 50% stenosis. Left carotid system: Left common and internal carotid arteries are tortuous but patent without dissection. Mild atheromatous change about the left carotid bulb without hemodynamically significant greater than 50% stenosis. Vertebral arteries: Both vertebral arteries arise from subclavian arteries. No proximal subclavian artery stenosis. Vertebral arteries are patent without stenosis or dissection. Skeleton: No worrisome osseous lesions. Chronic compression deformity involving the C7 vertebral body noted, similar to prior. Other neck: No other acute finding. Upper chest: Linear atelectatic changes noted within the left upper lobe. No other acute finding. Review of the MIP images confirms the above findings CTA HEAD FINDINGS Anterior circulation: Atheromatous change about the carotid siphons without hemodynamically significant stenosis. A1 segments patent bilaterally. Normal anterior communicating complex. Anterior cerebral arteries patent without stenosis. Normal in stenosis or occlusion. No proximal MCA branch occlusion or high-grade stenosis. Distal MCA branches perfused and symmetric. Posterior circulation: Both V4 segments patent without stenosis. Both PICA patent at their origins. Basilar patent without stenosis. Superior cerebellar arteries patent bilaterally. Atheromatous irregularity about the PCAs with associated mild right and mild-to-moderate left bilateral P2 stenoses (series 7, image 20). Venous sinuses: Patent allowing for timing the  contrast bolus. Anatomic variants: None significant.  No aneurysm. Review of the MIP images confirms the above findings IMPRESSION: 1. Negative CTA for large vessel occlusion or other emergent finding. 2. Atheromatous irregularity about the PCAs with associated mild right and mild-to-moderate left P2 stenoses. 3. Additional mild atheromatous change about the carotid bifurcations and carotid siphons without hemodynamically significant stenosis. Aortic Atherosclerosis (ICD10-I70.0). Electronically Signed   By: Morene Hoard M.D.   On: 04/12/2024 21:58   MR BRAIN WO CONTRAST Result Date: 04/12/2024 EXAM: MRI BRAIN WITHOUT CONTRAST 04/12/2024 06:05:46 PM TECHNIQUE: Multiplanar multisequence MRI of the head/brain was performed without the administration of intravenous contrast. COMPARISON: CT head without contrast 04/12/2024 and MR head without contrast 08/03/2023. CLINICAL HISTORY: Neuro deficit, acute, stroke suspected. No onset of slurred speech. FINDINGS: BRAIN AND VENTRICLES: An acute linear 7 mm infarct is present in the left thalamus. A remote right MCA territory infarct and ex vacuo dilation of the right lateral ventricle is noted. Atrophy and white matter disease are stable. No intracranial hemorrhage. No mass. No midline shift. No hydrocephalus. The sella is unremarkable. Normal flow voids. ORBITS: Bilateral lens replacements are noted. The globes and orbits are otherwise within normal limits. SINUSES AND MASTOIDS: No acute abnormality. BONES AND SOFT TISSUES: Normal marrow signal. No acute soft tissue abnormality. IMPRESSION: 1. Acute linear 7 mm infarct in the left thalamus. 2. Remote right MCA territory infarct with ex vacuo dilation of the right lateral ventricle. 3. Stable atrophy and white matter disease. Cortical vein findings were called to Dr. Waymond at 6:36 pm Electronically signed by: Lonni Necessary MD 04/12/2024 06:37 PM EST RP Workstation: [id number]   CT Head Wo Contrast Result  Date: 04/12/2024 CLINICAL DATA:  Mental status change of unknown cause. Generalized  weakness upon wakening today. EXAM: CT HEAD WITHOUT CONTRAST TECHNIQUE: Contiguous axial images were obtained from the base of the skull through the vertex without intravenous contrast. RADIATION DOSE REDUCTION: This exam was performed according to the departmental dose-optimization program which includes automated exposure control, adjustment of the mA and/or kV according to patient size and/or use of iterative reconstruction technique. COMPARISON:  MRI 08/03/2023.  CT 08/03/2023. FINDINGS: Brain: No focal abnormality seen affecting the brainstem or cerebellum. Cerebral hemispheres show an old infarction in the right insula and frontal operculum. Elsewhere, chronic small-vessel ischemic changes affect the hemispheric white matter, moderate to advanced. No sign of acute infarction, mass lesion, hemorrhage, hydrocephalus or extra-axial collection. Vascular: There is atherosclerotic calcification of the major vessels at the base of the brain. Skull: Negative Sinuses/Orbits: Clear/normal Other: None IMPRESSION: No acute CT finding. Old infarction in the right insula and frontal operculum. Chronic small-vessel ischemic changes of the hemispheric white matter, moderate to advanced. Electronically Signed   By: Oneil Officer M.D.   On: 04/12/2024 14:05   DG Chest 2 View Result Date: 04/12/2024 CLINICAL DATA:  Cough for 3 days.  Hypoxia. EXAM: CHEST - 2 VIEW COMPARISON:  09/16/2023 FINDINGS: The heart size and mediastinal contours are within normal limits. Low lung volumes again noted. Both lungs are clear. IMPRESSION: Low lung volumes. No active cardiopulmonary disease. Electronically Signed   By: Norleen DELENA Kil M.D.   On: 04/12/2024 11:28    Microbiology: Results for orders placed or performed during the hospital encounter of 04/12/24  Resp panel by RT-PCR (RSV, Flu A&B, Covid) Urine, Catheterized     Status: None   Collection  Time: 04/12/24  4:00 PM   Specimen: Urine, Catheterized; Nasal Swab  Result Value Ref Range Status   SARS Coronavirus 2 by RT PCR NEGATIVE NEGATIVE Final    Comment: (NOTE) SARS-CoV-2 target nucleic acids are NOT DETECTED.  The SARS-CoV-2 RNA is generally detectable in upper respiratory specimens during the acute phase of infection. The lowest concentration of SARS-CoV-2 viral copies this assay can detect is 138 copies/mL. A negative result does not preclude SARS-Cov-2 infection and should not be used as the sole basis for treatment or other patient management decisions. A negative result may occur with  improper specimen collection/handling, submission of specimen other than nasopharyngeal swab, presence of viral mutation(s) within the areas targeted by this assay, and inadequate number of viral copies(<138 copies/mL). A negative result must be combined with clinical observations, patient history, and epidemiological information. The expected result is Negative.  Fact Sheet for Patients:  bloggercourse.com  Fact Sheet for Healthcare Providers:  seriousbroker.it  This test is no t yet approved or cleared by the [place]  FDA and  has been authorized for detection and/or diagnosis of SARS-CoV-2 by FDA under an Emergency Use Authorization (EUA). This EUA will remain  in effect (meaning this test can be used) for the duration of the COVID-19 declaration under Section 564(b)(1) of the Act, 21 U.S.C.section 360bbb-3(b)(1), unless the authorization is terminated  or revoked sooner.       Influenza A by PCR NEGATIVE NEGATIVE Final   Influenza B by PCR NEGATIVE NEGATIVE Final    Comment: (NOTE) The Xpert Xpress SARS-CoV-2/FLU/RSV plus assay is intended as an aid in the diagnosis of influenza from Nasopharyngeal swab specimens and should not be used as a sole basis for treatment. Nasal washings and aspirates are unacceptable for  Xpert Xpress SARS-CoV-2/FLU/RSV testing.  Fact Sheet for Patients: bloggercourse.com  Fact Sheet for  Healthcare Providers: seriousbroker.it  This test is not yet approved or cleared by the [place]  FDA and has been authorized for detection and/or diagnosis of SARS-CoV-2 by FDA under an Emergency Use Authorization (EUA). This EUA will remain in effect (meaning this test can be used) for the duration of the COVID-19 declaration under Section 564(b)(1) of the Act, 21 U.S.C. section 360bbb-3(b)(1), unless the authorization is terminated or revoked.     Resp Syncytial Virus by PCR NEGATIVE NEGATIVE Final    Comment: (NOTE) Fact Sheet for Patients: bloggercourse.com  Fact Sheet for Healthcare Providers: seriousbroker.it  This test is not yet approved or cleared by the [place]  FDA and has been authorized for detection and/or diagnosis of SARS-CoV-2 by FDA under an Emergency Use Authorization (EUA). This EUA will remain in effect (meaning this test can be used) for the duration of the COVID-19 declaration under Section 564(b)(1) of the Act, 21 U.S.C. section 360bbb-3(b)(1), unless the authorization is terminated or revoked.  Performed at [facility], [address] [place], [place] [address]   Respiratory (~20 pathogens) panel by PCR     Status: None   Collection Time: 04/13/24  5:43 PM   Specimen: Nasopharyngeal Swab; Respiratory  Result Value Ref Range Status   Adenovirus NOT DETECTED NOT DETECTED Final   Coronavirus 229E NOT DETECTED NOT DETECTED Final    Comment: (NOTE) The Coronavirus on the Respiratory Panel, DOES NOT test for the novel  Coronavirus (2019 nCoV)    Coronavirus HKU1 NOT DETECTED NOT DETECTED Final   Coronavirus NL63 NOT DETECTED NOT DETECTED Final   Coronavirus OC43 NOT DETECTED NOT DETECTED Final   Metapneumovirus NOT DETECTED NOT  DETECTED Final   Rhinovirus / Enterovirus NOT DETECTED NOT DETECTED Final   Influenza A NOT DETECTED NOT DETECTED Final   Influenza B NOT DETECTED NOT DETECTED Final   Parainfluenza Virus 1 NOT DETECTED NOT DETECTED Final   Parainfluenza Virus 2 NOT DETECTED NOT DETECTED Final   Parainfluenza Virus 3 NOT DETECTED NOT DETECTED Final   Parainfluenza Virus 4 NOT DETECTED NOT DETECTED Final   Respiratory Syncytial Virus NOT DETECTED NOT DETECTED Final   Bordetella pertussis NOT DETECTED NOT DETECTED Final   Bordetella Parapertussis NOT DETECTED NOT DETECTED Final   Chlamydophila pneumoniae NOT DETECTED NOT DETECTED Final   Mycoplasma pneumoniae NOT DETECTED NOT DETECTED Final    Comment: Performed at [facility], [address] [place], [place] [address]    Labs: CBC: Recent Labs  Lab 04/12/24 1343 04/14/24 0416  WBC 8.3 7.1  NEUTROABS 5.3  --   HGB 13.5 12.2  HCT 42.2 39.0  MCV 95.7 96.1  PLT 339 331   Basic Metabolic Panel: Recent Labs  Lab 04/12/24 1343 04/14/24 0416  NA 139 143  K 4.5 4.4  CL 102 106  CO2 27 27  GLUCOSE 97 88  BUN 17 22  CREATININE 0.82 0.87  CALCIUM  9.7 9.2   Liver Function Tests: Recent Labs  Lab 04/12/24 1343  AST 25  ALT 14  ALKPHOS 147*  BILITOT 0.6  PROT 7.3  ALBUMIN 3.9   CBG: No results for input(s): GLUCAP in the last 168 hours.  Discharge time spent: greater than 30 minutes.  Signed: Laree Lock, MD [facility] 04/14/2024

## 2024-04-14 NOTE — Plan of Care (Signed)
  Problem: Education: Goal: Knowledge of patient specific risk factors will improve (DELETE if not current risk factor) Outcome: Progressing   Problem: Coping: Goal: Will verbalize positive feelings about self Outcome: Progressing   Problem: Health Behavior/Discharge Planning: Goal: Ability to manage health-related needs will improve Outcome: Progressing   Problem: Self-Care: Goal: Verbalization of feelings and concerns over difficulty with self-care will improve Outcome: Progressing
